# Patient Record
Sex: Male | Born: 1960
Health system: Southern US, Community
[De-identification: ages and names within clinical notes are randomized; demographics above are authoritative.]

## PROBLEM LIST (undated history)

## (undated) DIAGNOSIS — J449 Chronic obstructive pulmonary disease, unspecified: Secondary | ICD-10-CM

## (undated) DIAGNOSIS — R45851 Suicidal ideations: Secondary | ICD-10-CM

## (undated) DIAGNOSIS — F32A Depression, unspecified: Secondary | ICD-10-CM

## (undated) DIAGNOSIS — F1010 Alcohol abuse, uncomplicated: Secondary | ICD-10-CM

## (undated) DIAGNOSIS — I10 Essential (primary) hypertension: Secondary | ICD-10-CM

## (undated) DIAGNOSIS — F419 Anxiety disorder, unspecified: Secondary | ICD-10-CM

## (undated) HISTORY — PX: COLONOSCOPY: SHX174

## (undated) HISTORY — PX: NO PAST SURGERIES: SHX2092

## (undated) HISTORY — PX: MOUTH SURGERY: SHX715

---

## 2004-07-10 ENCOUNTER — Emergency Department (HOSPITAL_COMMUNITY): Admission: EM | Admit: 2004-07-10 | Discharge: 2004-07-10 | Payer: Self-pay | Admitting: Emergency Medicine

## 2006-02-15 ENCOUNTER — Emergency Department (HOSPITAL_COMMUNITY): Admission: EM | Admit: 2006-02-15 | Discharge: 2006-02-15 | Payer: Self-pay | Admitting: Emergency Medicine

## 2006-02-21 ENCOUNTER — Emergency Department (HOSPITAL_COMMUNITY): Admission: EM | Admit: 2006-02-21 | Discharge: 2006-02-21 | Payer: Self-pay | Admitting: Emergency Medicine

## 2006-07-01 ENCOUNTER — Emergency Department (HOSPITAL_COMMUNITY): Admission: EM | Admit: 2006-07-01 | Discharge: 2006-07-02 | Payer: Self-pay | Admitting: Emergency Medicine

## 2006-07-26 ENCOUNTER — Emergency Department (HOSPITAL_COMMUNITY): Admission: EM | Admit: 2006-07-26 | Discharge: 2006-07-26 | Payer: Self-pay | Admitting: Emergency Medicine

## 2006-08-30 ENCOUNTER — Emergency Department (HOSPITAL_COMMUNITY): Admission: EM | Admit: 2006-08-30 | Discharge: 2006-08-31 | Payer: Self-pay | Admitting: Emergency Medicine

## 2006-09-04 ENCOUNTER — Emergency Department (HOSPITAL_COMMUNITY): Admission: EM | Admit: 2006-09-04 | Discharge: 2006-09-05 | Payer: Self-pay | Admitting: Emergency Medicine

## 2006-09-12 ENCOUNTER — Ambulatory Visit: Payer: Self-pay | Admitting: Family Medicine

## 2006-09-28 ENCOUNTER — Emergency Department (HOSPITAL_COMMUNITY): Admission: EM | Admit: 2006-09-28 | Discharge: 2006-09-29 | Payer: Self-pay | Admitting: Emergency Medicine

## 2006-10-01 ENCOUNTER — Emergency Department (HOSPITAL_COMMUNITY): Admission: EM | Admit: 2006-10-01 | Discharge: 2006-10-01 | Payer: Self-pay | Admitting: Emergency Medicine

## 2006-10-02 ENCOUNTER — Ambulatory Visit: Payer: Self-pay | Admitting: Psychiatry

## 2006-10-02 ENCOUNTER — Inpatient Hospital Stay (HOSPITAL_COMMUNITY): Admission: AD | Admit: 2006-10-02 | Discharge: 2006-10-06 | Payer: Self-pay | Admitting: Psychiatry

## 2007-02-15 ENCOUNTER — Encounter: Payer: Self-pay | Admitting: Emergency Medicine

## 2007-02-16 ENCOUNTER — Inpatient Hospital Stay (HOSPITAL_COMMUNITY): Admission: EM | Admit: 2007-02-16 | Discharge: 2007-02-17 | Payer: Self-pay | Admitting: Internal Medicine

## 2007-02-20 ENCOUNTER — Emergency Department (HOSPITAL_COMMUNITY): Admission: EM | Admit: 2007-02-20 | Discharge: 2007-02-20 | Payer: Self-pay | Admitting: Emergency Medicine

## 2007-03-26 ENCOUNTER — Emergency Department (HOSPITAL_COMMUNITY): Admission: EM | Admit: 2007-03-26 | Discharge: 2007-03-26 | Payer: Self-pay | Admitting: Emergency Medicine

## 2007-04-18 ENCOUNTER — Inpatient Hospital Stay (HOSPITAL_COMMUNITY): Admission: EM | Admit: 2007-04-18 | Discharge: 2007-04-19 | Payer: Self-pay | Admitting: Emergency Medicine

## 2007-06-15 ENCOUNTER — Emergency Department (HOSPITAL_COMMUNITY): Admission: EM | Admit: 2007-06-15 | Discharge: 2007-06-15 | Payer: Self-pay | Admitting: Emergency Medicine

## 2009-03-11 ENCOUNTER — Emergency Department (HOSPITAL_COMMUNITY): Admission: EM | Admit: 2009-03-11 | Discharge: 2009-03-12 | Payer: Self-pay | Admitting: Emergency Medicine

## 2009-05-14 ENCOUNTER — Emergency Department (HOSPITAL_COMMUNITY): Admission: EM | Admit: 2009-05-14 | Discharge: 2009-05-14 | Payer: Self-pay | Admitting: Emergency Medicine
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
## 2010-07-27 NOTE — H&P (Signed)
NAME:  Zachary [name], Zachary [name] NO.:  [id number]   MEDICAL RECORD NO.:  [id number]          PATIENT TYPE:  EMS   LOCATION:  ED                           [facility]ACILITY[facility]  [facility]   PHYSICIAN:  Michaelyn Barter, M.D. DATE OF BIRTH:  11/20/1960   DATE OF ADMISSION:  02/15/2007  DATE OF DISCHARGE:                              HISTORY & PHYSICAL   PRIMARY CARE DOCTOR:  Unassigned.   CHIEF COMPLAINT:  Chest pain.   HISTORY OF PRESENT ILLNESS:  Zachary [name] is a 50 year old gentleman who  admits to being an alcoholic.  He states that he developed chest pain  approximately 3 weeks ago.  He states that he self medicated himself  with alcohol, however, the pain has never completely resolved.  Over the  past 3 weeks the pain has become worse.  He describes the pain as  diffusely occurring across his chest, starting from the right side and  going straight across.  He is very vague with regards to what the pain  feels like stating that he is unsure.  He initially indicated that the  pain comes and goes but then stated it was somewhat constant.  There are  no triggering factors for the pain but he indicates that drinking  alcohol makes the pain feel better.  He states that he typically drinks  anywhere from 3-4 beers and the pain would go away.  He also complains  of a productive cough which he describes as greenish-yellow in color.  He complains subjective fevers as well as chill.  He was seen in  [facility] last Thursday and was subsequently diagnosed as  having bronchitis and for this he was treated with a Z-Pak and  discharged.  He indicates that he his symptoms persisted despite him  completing the course of antibiotics.  He also complains of  shortness  of breath and indicates that his breathing has continued to decline over  the past several days.  He complains of diarrhea and attributes this to  the antibiotics that he was treated with recently.  He admits to abusing  alcohol on a daily basis and states that today he drank 14 beers.   PAST MEDICAL HISTORY:  1. Depression.  2. Bronchitis.  3. Pneumonia.  4. Childhood asthma.   PAST SURGICAL HISTORY:  None.   ALLERGIES:  PENICILLIN, reaction is unknown.   HOME MEDICATIONS:  None.   SOCIAL HISTORY:  The patient openly admits to being an alcoholic.  He is  very vague with regards to the amount of alcohol he drinks a day.  Initially he stated that he drank wine, then he went on to state that he  drank a fifth a day, and then he finally admitted that he drinks  anywhere from one 6-pack to 12 cans of beer per day.  Cigarettes:  The  patient started smoking at the age of 69.  He smokes 2 packs of  cigarettes per day.  Cocaine:  The patient openly admits to using  cocaine.  He states that the last time that he used cocaine was 3 weeks  ago.  It was provided to him by his ex-wife and at that time he used  powder cocaine.  Marijuana:  The patient openly admits to using  marijuana.   FAMILY HISTORY:  His mother had a CVA.  His father had hypertension and  also 2 myocardial infarctions.  Sister died from a brain tumor.   REVIEW OF SYSTEMS:  As per HPI, otherwise all other systems are  negative.   PHYSICAL EXAMINATION:  GENERAL: The patient is awake.  He is  cooperative.  He is in no obvious respiratory distress.  VITAL SIGNS:  His temperature is 98.2, blood pressure 133/72, heart rate  85, respirations 18, O2 sat is 96%.  HEENT:  Normocephalic atraumatic.  Anicteric.  Extraocular movements are  intact.  Oral mucosa is pink.  No thrush.  No exudates.  NECK:  Supple.  No JVD.  No lymphadenopathy.  CARDIAC:  S1 S2 present.  Regular rate and rhythm.  RESPIRATORY:  No crackles or wheezes.  ABDOMEN:  Flat, soft, nontender, nondistended.  Positive bowel sounds.  No hepatosplenomegaly.  No masses palpated.  EXTREMITIES:  No leg edema.  NEUROLOGIC:  The patient is alert, oriented x3.  Cranial nerves II-XII   are intact.  MUSCULOSKELETAL:  With 5/5 upper and lower extremity strength.   LABORATORY:  Sodium is 136, potassium 3.8, chloride 103, CO2 23, glucose  112, BUN 2, creatinine 0.71, calcium is 8.9.  Alcohol level is 273.  CK-  MB, point-of-care, is less than 1.  Troponin I, point-of-care, is less  than 0.05.  White blood cell count is 8.1, hemoglobin 15.3, hematocrit  44.1, platelets 279.  Urinalysis:  Negative.  Urine drug screen  negative.   ASSESSMENT/PLAN:  1. Pneumonia.  We will start the patient on empiric intravenous      antibiotics.  We will check blood as well as sputum cultures.  2. History of ongoing alcohol abuse.  We will start the patient on      Librium alcohol withdrawal protocol.  We will provide the patient      with thiamine, folic acid, and multivitamin.  3. Chest Pain. Sounds atypical, it may be related to the patient's      pneumonia?.  Will cycle the patient't cardiac marker, and  provide      prn pain meds.  4. Gastrointestinal prophylaxis.  We will provide Protonix.  5. Deep vein thrombosis prophylaxis.  We will provide Lovenox.  6. Cigarette abuse.  We will consider a nicotine patch.  7. Diarrhea.  We will check stools for C. diff.      Michaelyn Barter, M.D.  Electronically Signed     OR/MEDQ  D:  02/15/2007  T:  02/16/2007  Job:  [id number]
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
## 2010-07-27 NOTE — Discharge Summary (Signed)
NAMEJEVEN, Zachary [name] NO.:  [id number]   MEDICAL RECORD NO.:  [id number]          PATIENT TYPE:  INP   LOCATION:  5014                         FACILITY:  [facility]   PHYSICIAN:  Elliot Cousin, M.D.    DATE OF BIRTH:  Nov 30, 1960   DATE OF ADMISSION:  02/16/2007  DATE OF DISCHARGE:  02/17/2007                               DISCHARGE SUMMARY   DISCHARGE DIAGNOSES:  1. Pneumonia superimposed on bronchitis.  2. Chest pain thought to be musculoskeletal in origin.  3. Tobacco and alcohol abuse.  4. Anxiety, not otherwise specified.  5. Reported diarrhea, none during the hospitalization.  6. Reported suicidal ideation.  The patient was evaluated by      psychiatrist Dr. Jeanie Sewer, and Dr. Jeanie Sewer felt that the patient      was not suicidal.   MEDICATIONS:  1. Avelox 400 mg daily for 5 more days.  2. Albuterol MDI 2 puffs every 4 hours as needed.  3. Multivitamin with iron once daily.  4. Robitussin DM 10 mL every 4 hours as needed for cough.   DISCHARGE DISPOSITION:  The patient was discharged to home in improved  and stable condition on February 17, 2007.  He was advised to follow up  with the [facility] and [facility] in 1 week.   CONSULTATIONS:  Antonietta Breach, M.D.   PROCEDURE PERFORMED:  Chest X-Ray.  The results revealed subtle airspace  disease in the right lower lung, appears new from prior study.   HISTORY OF PRESENT ILLNESS:  The patient is a 50 year old man with a  past medical history significant for alcohol abuse, depression, and a  recent hospitalization at [facility] for acute bronchitis.  He presented to the emergency department at [facility] on  February 15, 2007 with the chief complaint of chest pain.  The pain had  been mild-to-moderate over the past 3 weeks.  It had become  progressively worse.  He did acknowledge a productive cough with yellow  to green sputum.  Because of the pain, he had been medicating himself  with  a six-pack to a 12-pack of beer daily.  He also smokes two packs of  cigarettes daily and occasionally smokes cocaine.   The patient was therefore admitted for further evaluation and  management.  For additional details, please see the dictated history and  physical.   HOSPITAL COURSE:  Problem 1.  CHEST PAIN, PNEUMONIA, HISTORY OF  BRONCHITIS.  The patient was started on empiric antibiotic treatment  with Avelox.  Symptomatic treatment was started with albuterol and  Atrovent nebulizers, Mucinex, and as-needed Robitussin DM.  Tylenol was  started for pain that was mild and Percocet was started for pain that  was moderate.  Blood cultures were ordered as were cardiac enzymes.  Over the course of the hospitalization, the patient's chest pain  subsided but it did not completely resolve, as the patient did have an  ongoing intermittently productive and dry cough.  His cardiac enzymes  were completely normal.  His blood cultures remained negative.  The  patient is currently afebrile, and his white  blood cell count is within  normal limits.  Given these findings, he will be discharged to home  today on 5 more days of Avelox therapy.  As of today, he has received 2  days of antibiotic treatment.  A prescription for Percocet was going to  be written and given to the patient; however he refused it.  The patient  was therefore advised to take as needed over-the-counter Tylenol or  ibuprofen.  Of note, Protonix was started empirically during the  hospitalization; however, it was discontinued at the time of hospital  discharge.   Problem 2.  TOBACCO AND ALCOHOL ABUSE.  The patient was provided with  tobacco and alcohol cessation counseling.  [facility] were  recommended to the patient by psychiatrist, Dr. Jeanie Sewer.  The patient  was offered a nicotine patch; however, he refused.  He was started on  vitamin therapy and a Librium taper during the hospitalization.  He  demonstrated no  signs or symptoms consistent with alcohol withdrawal.  The patient was advised to continue vitamin therapy and to avoid alcohol  and tobacco use in the outpatient setting.   Problem 3.  HISTORY OF DEPRESSION AND ANXIETY, NOT OTHERWISE SPECIFIED.  According to the report from the nurse in the emergency department, the  patient apparently voiced some suicidal ideation.  The patient later  recanted the notion that he wanted to kill himself.  Nevertheless, a 24-  hour sitter was ordered.  Psychiatrist, Dr. Jeanie Sewer, was consulted.  The official dictation from Dr. Jeanie Sewer is not back yet.  However, per  his written assessment in the chart, the patient did not have suicidal  ideation, rather he had an anxiety disorder not otherwise specified and  alcohol dependence.  Dr. Jeanie Sewer recommended the 12-step programs that  are available and [facility] for the patient.  Information  was given to the patient by the clinical social worker prior to hospital  discharge.   Of note, the Pneumovax and the flu vaccine were given during the  hospital course.      Elliot Cousin, M.D.  Electronically Signed     DF/MEDQ  D:  02/17/2007  T:  02/18/2007  Job:  [id number]

## 2010-07-27 NOTE — H&P (Signed)
NAME:  Zachary [name], Zachary [name] NO.:  [id number]   MEDICAL RECORD NO.:  [id number]          PATIENT TYPE:  IPS   LOCATION:  0507                          FACILITY:  [facility]   PHYSICIAN:  Geoffery Lyons, M.D.      DATE OF BIRTH:  [birth year]   DATE OF ADMISSION:  10/02/2006  DATE OF DISCHARGE:                       PSYCHIATRIC ADMISSION ASSESSMENT   IDENTIFYING INFORMATION:  This is 50 year old divorced white male  voluntarily admitted on October 02, 2006.   HISTORY OF PRESENT ILLNESS:  The patient presents with a history of  alcohol abuse and depressive symptoms.  The patient has been drinking  beer.  He states he is trying to stay away from drinking liquor.  He has  been drinking for the last few weeks, drinking up to 24 beers at a time.  The patient states he stopped drinking in December and then relapsed in  January.  He started off with some casual drinking.  He states his  drinking escalated since his mother passed away and has been self-  medicating.  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  He  has been drinking up to a fifth at a time.  Last drink was on Saturday  prior to patient's admission.  He has lost about 60 pounds over a month  period of time.  He reports problems with vomiting.   PAST PSYCHIATRIC HISTORY:  First admission to [facility].  Was detoxed when he was in [place].   SOCIAL HISTORY:  This is a 50 year old divorced white male.  He lives  with his two sons.  The patient is self-employed.   FAMILY HISTORY:  Father with alcohol problems.   ALCOHOL/DRUG HISTORY:  The patient smokes.  Denies any drug use.   PRIMARY CARE PHYSICIAN:  None.   MEDICAL PROBLEMS:  Had a recent episode of pneumonia and chronic  bronchitis.  Denies any seizure activity.   MEDICATIONS:  Has been on no prior medications prior to arrival.   ALLERGIES:  PENICILLIN.   PHYSICAL EXAMINATION:  The patient was assessed at [facility] Emergency  Department.   This is a middle-aged male who appears tired.  He appears  nontremulous at this time.  While he was in the emergency department, he  received Zofran and Ativan along with some IV fluids.  His temperature  is 97.4, heart rate 104, respirations 20, blood pressure 143/94, height  5 feet 3 inches tall, weight 158 pounds.  The patient does show some  mild upper extremity tremors.   LABORATORY DATA:  His urinalysis was within normal limits.  His CBC  within normal limits.  The patient's urine drug screen was positive for  benzodiazepines.  His AST is elevated mildly at 51.   MENTAL STATUS EXAM:  He is fully alert, cooperative.  He appears tired.  He has fair eye contact.  His speech is clear, normal pace and tone.  Mood is depressed.  The patient is cooperative, pleasant.  Thought  process with no evidence of thought disorder.  Cognitive function  intact.  His memory is good.  He is a  good historian.  Judgment and  insight is good.  Poor impulse control.  Good fund of knowledge.   DIAGNOSES:  AXIS I:  Alcohol dependence.  Depressive disorder not  otherwise specified.  AXIS II:  Deferred.  AXIS III:  Chronic bronchitis.  AXIS IV:  Psychosocial problems related to ongoing grief for mother's  death.  AXIS V:  Current 40.   PLAN:  Contract for safety.  Stabilize mood and thinking.  Will detox  the patient with Librium protocol.  Work on relapse prevention.  Will  assess the patient for depressive symptoms as the patient continues to  detox.  Casemanager is to assess rehab program available.   TENTATIVE LENGTH OF STAY:  Four to five days.      Landry Corporal, N.P.      Geoffery Lyons, M.D.  Electronically Signed    JO/MEDQ  D:  10/06/2006  T:  10/06/2006  Job:  [id number]

## 2010-07-30 NOTE — Discharge Summary (Signed)
NAME:  Zachary [name], Zachary [name] NO.:  [id number]   MEDICAL RECORD NO.:  [id number]          PATIENT TYPE:  IPS   LOCATION:  0507                          FACILITY:  [facility]   PHYSICIAN:  Geoffery Lyons, M.D.      DATE OF BIRTH:  Oct 02, 1960   DATE OF ADMISSION:  10/02/2006  DATE OF DISCHARGE:  10/06/2006                               DISCHARGE SUMMARY   CHIEF COMPLAINT AND PRESENT ILLNESS:  This was the first admission to  [facility] for this 50 year old divorced white male  voluntarily admitted.  History of alcohol abuse and depression.  Had  been drinking beer.  Trying to stay away from drinking liquor.  Drinking  for the past few weeks, drinking up to 24 beers at a time.  He stopped  drinking in December and then relapsed in January.  Starts off with some  casual drinking.  Drinking escalated since mother passed away and has  been self-medicating.  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  Been drinking up to a fifth at a time.  Last drink on Saturday prior to  the admission.  Has lost about 60 pounds.  Reports problem with  vomiting.   PAST PSYCHIATRIC HISTORY:  First time at [facility].  Was detoxed  when he was in [place].   ALCOHOL/DRUG HISTORY:  Persistent use of alcohol as already stated.   MEDICAL HISTORY:  Past history of pneumonia and bronchitis.   MEDICATIONS:  None currently.   PHYSICAL EXAMINATION:  Performed and failed to show any acute findings.   LABORATORY DATA:  CBC within normal limits.  Urine drug screen positive  for benzodiazepines.  SGOT 51.   MENTAL STATUS EXAM:  Fully alert, cooperative male who appears tired.  Fair eye contact.  Speech is clear, normal rate, tempo and production.  Mood is depressed.  Thought processes are logical, coherent and  relevant.  No evidence of delusions.  No active suicidal or homicidal  ideation.  No hallucinations.  Cognition well-preserved.   ADMISSION DIAGNOSES:  AXIS I:  Alcohol  dependence.  Depressive disorder  not otherwise specified.  AXIS II:  No diagnosis.  AXIS III;  Chronic bronchitis.  AXIS IV:  Moderate.  AXIS V:  GAF upon admission 40; highest GAF in the last year 70.   HOSPITAL COURSE:  He was admitted.  He was started in individual and  group psychotherapy.  He was given trazodone for sleep.  He was  detoxified with Librium.  He was later started on Paxil and Antabuse.  As already stated, mother died in 07/05/06.  Has not gotten over  it.  Last alcohol two days prior to the admission.  Had been in  residential at [facility].  His detox was pretty rough requiring p.r.n. of  Librium, endorsed nightmares.  He continued to be tremulous.  While at  [facility], he was placed on Paxil.  Endorsed depression and anxiety.  Has  dealt with multiple stressors work-related, dealing with his sons who  work with him, deals with the mother, ex-wife, getting back to be part  of his life in a very confusing situation.  Endorsed increased anxiety  and depression.  Endorsed he had used alcohol to cope.  He was willing  to go back on the Paxil and we told talked about Antabuse.  By October 06, 2006, he endorsed he was much better, slept all night, fully detoxed.  Mood was improved.  Willing to take the Antabuse and give this modality  a try.  Talked to his children and they all have different plans.  They  are going to get different jobs so that is going to decrease most of his  pressure.   DISCHARGE DIAGNOSES:  AXIS I:  Alcohol dependence.  Depressive disorder  not otherwise specified.  AXIS II:  No diagnosis.  AXIS III:  Chronic bronchitis.  AXIS IV:  Moderate.  AXIS V:  GAF upon discharge 55-60.   DISCHARGE MEDICATIONS:  1. Paxil 20 mg per day.  2. Trazodone 50 mg at bedtime for sleep.  3. Antabuse 250 mg per day.   FOLLOWUP:  [facility].      Geoffery Lyons, M.D.  Electronically Signed     IL/MEDQ  D:  10/24/2006  T:  10/25/2006  Job:  [id number]

## 2010-12-02 LAB — URINALYSIS, ROUTINE W REFLEX MICROSCOPIC
Bilirubin Urine: NEGATIVE
Hgb urine dipstick: NEGATIVE
Nitrite: NEGATIVE
Protein, ur: NEGATIVE
Specific Gravity, Urine: 1.007
Urobilinogen, UA: 0.2

## 2010-12-02 LAB — I-STAT 8, (EC8 V) (CONVERTED LAB)
Acid-base deficit: 1
BUN: <3 — ABNORMAL LOW
Bicarbonate: 21
Chloride: 108
Glucose, Bld: 95
HCT: 48
Operator id: 234501
Potassium: 4.1
pCO2, Ven: 28.1 — ABNORMAL LOW

## 2010-12-02 LAB — CBC
Hemoglobin: 16.4
MCHC: 34.7
MCV: 95.1
RBC: 4.97
WBC: 9.6

## 2010-12-02 LAB — RAPID URINE DRUG SCREEN, HOSP PERFORMED
Barbiturates: NOT DETECTED
Cocaine: NOT DETECTED
Opiates: NOT DETECTED

## 2010-12-02 LAB — POCT I-STAT CREATININE
Creatinine, Ser: 0.8
Operator id: 234501

## 2010-12-02 LAB — DIFFERENTIAL
Basophils Absolute: 0
Lymphs Abs: 2.7

## 2010-12-03 LAB — DIFFERENTIAL
Basophils Absolute: 0
Basophils Relative: 0
Eosinophils Absolute: 0.1
Eosinophils Relative: 1
Lymphocytes Relative: 16
Lymphs Abs: 1.8
Monocytes Absolute: 0.9
Monocytes Relative: 8
Neutro Abs: 8.8 — ABNORMAL HIGH
Neutrophils Relative %: 76

## 2010-12-03 LAB — HEMOGLOBIN A1C: Hgb A1c MFr Bld: 5.3

## 2010-12-03 LAB — CBC
HCT: 45.3
Hemoglobin: 16.4
MCHC: 36.1 — ABNORMAL HIGH
MCV: 93.3
Platelets: 275
RBC: 4.85
RDW: 13.3
WBC: 11.7 — ABNORMAL HIGH

## 2010-12-03 LAB — CARDIAC PANEL(CRET KIN+CKTOT+MB+TROPI)
CK, MB: 2.4
CK, MB: 2.5
Relative Index: 1.8
Relative Index: 1.9
Troponin I: 0.01

## 2010-12-03 LAB — LIPID PANEL
Cholesterol: 262 — ABNORMAL HIGH
HDL: 108

## 2010-12-03 LAB — CK TOTAL AND CKMB (NOT AT ARMC): Relative Index: 2.4

## 2010-12-03 LAB — POCT CARDIAC MARKERS
CKMB, poc: 1.6
Myoglobin, poc: 76.1
Operator id: 3067
Troponin i, poc: <0.05

## 2010-12-03 LAB — RAPID URINE DRUG SCREEN, HOSP PERFORMED
Amphetamines: NOT DETECTED
Opiates: NOT DETECTED
Tetrahydrocannabinol: NOT DETECTED

## 2010-12-03 LAB — BASIC METABOLIC PANEL
BUN: 7
CO2: 22
CO2: 26
Calcium: 8.7
Calcium: 9.2
Chloride: 100
Creatinine, Ser: 0.66
GFR calc non Af Amer: >60
Glucose, Bld: 87
Potassium: 3.9
Potassium: 4.4
Sodium: 134 — ABNORMAL LOW

## 2010-12-03 LAB — PROTIME-INR: INR: 0.9

## 2010-12-07 LAB — BASIC METABOLIC PANEL
BUN: 6
CO2: 26
Creatinine, Ser: 0.73
GFR calc Af Amer: >60
Glucose, Bld: 114 — ABNORMAL HIGH
Sodium: 134 — ABNORMAL LOW

## 2010-12-07 LAB — CBC
HCT: 41.9
MCHC: 35.3
MCV: 94.2
Platelets: 171
RDW: 12.7

## 2010-12-07 LAB — RAPID URINE DRUG SCREEN, HOSP PERFORMED
Amphetamines: NOT DETECTED
Barbiturates: NOT DETECTED

## 2010-12-07 LAB — ETHANOL: Alcohol, Ethyl (B): <5

## 2010-12-07 LAB — DIFFERENTIAL
Basophils Absolute: 0
Basophils Relative: 0
Eosinophils Absolute: 0.1
Eosinophils Relative: 1
Lymphs Abs: 0.9

## 2010-12-20 LAB — CBC
HCT: 36.2 — ABNORMAL LOW
Hemoglobin: 15.3
MCHC: 34.6
MCV: 91.8
MCV: 94.1
Platelets: 224
RBC: 3.85 — ABNORMAL LOW
RBC: 4.81
WBC: 7.5

## 2010-12-20 LAB — URINALYSIS, ROUTINE W REFLEX MICROSCOPIC
Glucose, UA: NEGATIVE
Protein, ur: NEGATIVE
Specific Gravity, Urine: 1.004 — ABNORMAL LOW
Urobilinogen, UA: 0.2

## 2010-12-20 LAB — MAGNESIUM: Magnesium: 2.2

## 2010-12-20 LAB — COMPREHENSIVE METABOLIC PANEL
ALT: 62 — ABNORMAL HIGH
AST: 33
Albumin: 3.3 — ABNORMAL LOW
Alkaline Phosphatase: 82
Calcium: 9.1
GFR calc Af Amer: >60
Glucose, Bld: 67 — ABNORMAL LOW
Potassium: 3.6
Sodium: 140
Total Protein: 5.4 — ABNORMAL LOW

## 2010-12-20 LAB — CK TOTAL AND CKMB (NOT AT ARMC)
CK, MB: 2.2
Total CK: 136
Total CK: 89

## 2010-12-20 LAB — CULTURE, BLOOD (ROUTINE X 2): Culture: NO GROWTH

## 2010-12-20 LAB — TROPONIN I
Troponin I: 0.01
Troponin I: 0.02

## 2010-12-20 LAB — BASIC METABOLIC PANEL
CO2: 23
Chloride: 103
GFR calc Af Amer: >60
Sodium: 136

## 2010-12-20 LAB — RAPID URINE DRUG SCREEN, HOSP PERFORMED
Barbiturates: NOT DETECTED
Benzodiazepines: NOT DETECTED

## 2010-12-20 LAB — DIFFERENTIAL
Basophils Relative: 1
Eosinophils Absolute: 0.2
Monocytes Absolute: 1.1 — ABNORMAL HIGH
Monocytes Relative: 13 — ABNORMAL HIGH

## 2010-12-27 LAB — URINALYSIS, ROUTINE W REFLEX MICROSCOPIC
Bilirubin Urine: NEGATIVE
Glucose, UA: NEGATIVE
Glucose, UA: NEGATIVE
Hgb urine dipstick: NEGATIVE
Hgb urine dipstick: NEGATIVE
Ketones, ur: NEGATIVE
Nitrite: NEGATIVE
Specific Gravity, Urine: 1.025
Specific Gravity, Urine: 1.025
Urobilinogen, UA: 0.2
pH: 5.5
pH: 6

## 2010-12-27 LAB — COMPREHENSIVE METABOLIC PANEL
ALT: 59 — ABNORMAL HIGH
AST: 51 — ABNORMAL HIGH
Alkaline Phosphatase: 120 — ABNORMAL HIGH
BUN: 3 — ABNORMAL LOW
CO2: 23
CO2: 23
Calcium: 8.6
Chloride: 95 — ABNORMAL LOW
Creatinine, Ser: 0.85
GFR calc Af Amer: >60
GFR calc non Af Amer: >60
GFR calc non Af Amer: >60
Glucose, Bld: 118 — ABNORMAL HIGH
Sodium: 123 — ABNORMAL LOW
Total Bilirubin: 1.5 — ABNORMAL HIGH

## 2010-12-27 LAB — BASIC METABOLIC PANEL
CO2: 24
Chloride: 101
GFR calc Af Amer: >60
Potassium: 4
Sodium: 133 — ABNORMAL LOW

## 2010-12-27 LAB — CBC
HCT: 43.7
HCT: 49
Hemoglobin: 15.6
Hemoglobin: 16.9
MCHC: 34.5
MCV: 91.7
RBC: 4.77
RBC: 5.38
RDW: 12.4
WBC: 10.3

## 2010-12-27 LAB — DIFFERENTIAL
Basophils Absolute: 0.1
Basophils Relative: 0
Eosinophils Absolute: 0
Eosinophils Absolute: 0.2
Eosinophils Relative: 2
Lymphocytes Relative: 5 — ABNORMAL LOW
Lymphs Abs: 1.6
Neutro Abs: 5.3
Neutrophils Relative %: 69

## 2010-12-27 LAB — ETHANOL
Alcohol, Ethyl (B): 283 — ABNORMAL HIGH
Alcohol, Ethyl (B): <5

## 2010-12-27 LAB — LIPASE, BLOOD
Lipase: 26
Lipase: 53

## 2010-12-27 LAB — RAPID URINE DRUG SCREEN, HOSP PERFORMED
Barbiturates: NOT DETECTED
Cocaine: NOT DETECTED

## 2010-12-29 LAB — COMPREHENSIVE METABOLIC PANEL
Alkaline Phosphatase: 130 — ABNORMAL HIGH
BUN: 3 — ABNORMAL LOW
CO2: 23
Chloride: 97
Glucose, Bld: 87
Potassium: 3.8
Total Bilirubin: 0.8

## 2010-12-29 LAB — I-STAT 8, (EC8 V) (CONVERTED LAB)
Bicarbonate: 21.2
HCT: 45
Hemoglobin: 15.3
Operator id: 277751
Sodium: 135
TCO2: 22
pCO2, Ven: 37.6 — ABNORMAL LOW

## 2010-12-29 LAB — HEPATIC FUNCTION PANEL
Albumin: 3.3 — ABNORMAL LOW
Indirect Bilirubin: 0.5
Total Bilirubin: 0.6
Total Protein: 5.9 — ABNORMAL LOW

## 2010-12-29 LAB — ETHANOL
Alcohol, Ethyl (B): 209 — ABNORMAL HIGH
Alcohol, Ethyl (B): 332 — ABNORMAL HIGH

## 2010-12-29 LAB — DIFFERENTIAL
Basophils Absolute: 0.1
Basophils Relative: 1
Eosinophils Absolute: 0.2
Monocytes Absolute: 1.1 — ABNORMAL HIGH
Neutro Abs: 4.7
Neutrophils Relative %: 57

## 2010-12-29 LAB — CBC
HCT: 47.5
Hemoglobin: 14.4
Hemoglobin: 16.4
MCHC: 34.8
MCV: 94.4
RBC: 5.07
RDW: 14.4 — ABNORMAL HIGH
WBC: 8.2

## 2010-12-29 LAB — URINALYSIS, ROUTINE W REFLEX MICROSCOPIC
Hgb urine dipstick: NEGATIVE
Ketones, ur: 15 — AB
Protein, ur: NEGATIVE
Urobilinogen, UA: 0.2

## 2010-12-29 LAB — RAPID URINE DRUG SCREEN, HOSP PERFORMED
Amphetamines: NOT DETECTED
Barbiturates: NOT DETECTED
Opiates: NOT DETECTED

## 2015-10-28 ENCOUNTER — Emergency Department (HOSPITAL_COMMUNITY): Payer: Self-pay

## 2015-10-28 ENCOUNTER — Encounter (HOSPITAL_COMMUNITY): Payer: Self-pay | Admitting: *Deleted

## 2015-10-28 ENCOUNTER — Inpatient Hospital Stay (HOSPITAL_COMMUNITY)
Admission: EM | Admit: 2015-10-28 | Discharge: 2015-10-30 | DRG: 313 | Disposition: A | Discharge status: Home / Self Care | Payer: Self-pay | Attending: Family Medicine | Admitting: Family Medicine

## 2015-10-28 DIAGNOSIS — Z8249 Family history of ischemic heart disease and other diseases of the circulatory system: Secondary | ICD-10-CM

## 2015-10-28 DIAGNOSIS — R079 Chest pain, unspecified: Secondary | ICD-10-CM | POA: Diagnosis present

## 2015-10-28 DIAGNOSIS — F10939 Alcohol use, unspecified with withdrawal, unspecified: Secondary | ICD-10-CM | POA: Diagnosis present

## 2015-10-28 DIAGNOSIS — K30 Functional dyspepsia: Secondary | ICD-10-CM

## 2015-10-28 DIAGNOSIS — F1010 Alcohol abuse, uncomplicated: Secondary | ICD-10-CM | POA: Diagnosis present

## 2015-10-28 DIAGNOSIS — F10239 Alcohol dependence with withdrawal, unspecified: Secondary | ICD-10-CM | POA: Diagnosis present

## 2015-10-28 DIAGNOSIS — I10 Essential (primary) hypertension: Secondary | ICD-10-CM | POA: Diagnosis present

## 2015-10-28 DIAGNOSIS — R0789 Other chest pain: Principal | ICD-10-CM | POA: Diagnosis present

## 2015-10-28 DIAGNOSIS — R739 Hyperglycemia, unspecified: Secondary | ICD-10-CM | POA: Diagnosis present

## 2015-10-28 DIAGNOSIS — R202 Paresthesia of skin: Secondary | ICD-10-CM

## 2015-10-28 DIAGNOSIS — F10930 Alcohol use, unspecified with withdrawal, uncomplicated: Secondary | ICD-10-CM

## 2015-10-28 DIAGNOSIS — Z720 Tobacco use: Secondary | ICD-10-CM | POA: Diagnosis present

## 2015-10-28 DIAGNOSIS — F10230 Alcohol dependence with withdrawal, uncomplicated: Secondary | ICD-10-CM | POA: Diagnosis present

## 2015-10-28 DIAGNOSIS — F17200 Nicotine dependence, unspecified, uncomplicated: Secondary | ICD-10-CM | POA: Diagnosis present

## 2015-10-28 DIAGNOSIS — E878 Other disorders of electrolyte and fluid balance, not elsewhere classified: Secondary | ICD-10-CM | POA: Diagnosis present

## 2015-10-28 DIAGNOSIS — R110 Nausea: Secondary | ICD-10-CM

## 2015-10-28 HISTORY — DX: Alcohol abuse, uncomplicated: F10.10

## 2015-10-28 HISTORY — DX: Essential (primary) hypertension: I10

## 2015-10-28 LAB — CBC
HEMATOCRIT: 44.5 % (ref 39.0–52.0)
Hemoglobin: 15.9 g/dL (ref 13.0–17.0)
MCH: 32.4 pg (ref 26.0–34.0)
MCHC: 35.7 g/dL (ref 30.0–36.0)
MCV: 90.6 fL (ref 78.0–100.0)
PLATELETS: 243 10*3/uL (ref 150–400)
RBC: 4.91 MIL/uL (ref 4.22–5.81)
RDW: 13 % (ref 11.5–15.5)
WBC: 8.2 10*3/uL (ref 4.0–10.5)

## 2015-10-28 LAB — PROTIME-INR
INR: 0.94
PROTHROMBIN TIME: 12.5 s (ref 11.4–15.2)

## 2015-10-28 LAB — HEPATIC FUNCTION PANEL
ALK PHOS: 92 U/L (ref 38–126)
ALT: 55 U/L (ref 17–63)
AST: 41 U/L (ref 15–41)
Albumin: 4.2 g/dL (ref 3.5–5.0)
BILIRUBIN TOTAL: 0.5 mg/dL (ref 0.3–1.2)
TOTAL PROTEIN: 6.8 g/dL (ref 6.5–8.1)

## 2015-10-28 LAB — URINALYSIS, ROUTINE W REFLEX MICROSCOPIC
Bilirubin Urine: NEGATIVE
Glucose, UA: NEGATIVE mg/dL
HGB URINE DIPSTICK: NEGATIVE
Ketones, ur: NEGATIVE mg/dL
LEUKOCYTES UA: NEGATIVE
Nitrite: NEGATIVE
PROTEIN: NEGATIVE mg/dL
Specific Gravity, Urine: 1.01 (ref 1.005–1.030)
pH: 8 (ref 5.0–8.0)

## 2015-10-28 LAB — BASIC METABOLIC PANEL
ANION GAP: 14 (ref 5–15)
BUN: 12 mg/dL (ref 6–20)
CALCIUM: 9.1 mg/dL (ref 8.9–10.3)
CO2: 25 mmol/L (ref 22–32)
Chloride: 98 mmol/L — ABNORMAL LOW (ref 101–111)
Creatinine, Ser: 0.81 mg/dL (ref 0.61–1.24)
GFR calc Af Amer: >60 mL/min (ref >60)
Glucose, Bld: 247 mg/dL — ABNORMAL HIGH (ref 65–99)
POTASSIUM: 3.6 mmol/L (ref 3.5–5.1)
SODIUM: 137 mmol/L (ref 135–145)

## 2015-10-28 LAB — I-STAT TROPONIN, ED
TROPONIN I, POC: 0.01 ng/mL (ref 0.00–0.08)
Troponin i, poc: 0 ng/mL (ref 0.00–0.08)

## 2015-10-28 LAB — BRAIN NATRIURETIC PEPTIDE: B Natriuretic Peptide: 4.8 pg/mL (ref 0.0–100.0)

## 2015-10-28 LAB — TROPONIN I

## 2015-10-28 LAB — ETHANOL

## 2015-10-28 LAB — RAPID URINE DRUG SCREEN, HOSP PERFORMED
Amphetamines: NOT DETECTED
BARBITURATES: NOT DETECTED
BENZODIAZEPINES: NOT DETECTED
Cocaine: NOT DETECTED
Opiates: NOT DETECTED
Tetrahydrocannabinol: NOT DETECTED

## 2015-10-28 LAB — LIPASE, BLOOD: LIPASE: 23 U/L (ref 11–51)

## 2015-10-28 MED ORDER — LORAZEPAM 2 MG/ML IJ SOLN
0.0000 mg | Freq: Four times a day (QID) | INTRAMUSCULAR | Status: DC
  Filled 2015-10-28: qty 1

## 2015-10-28 MED ORDER — VITAMIN B-1 100 MG PO TABS
100.0000 mg | ORAL_TABLET | Freq: Every day | ORAL | Status: DC
  Administered 2015-10-28: 100 mg via ORAL
  Filled 2015-10-28 (×2): qty 1

## 2015-10-28 MED ORDER — FAMOTIDINE IN NACL 20-0.9 MG/50ML-% IV SOLN
20.0000 mg | Freq: Two times a day (BID) | INTRAVENOUS | Status: DC
  Administered 2015-10-28 – 2015-10-30 (×3): 20 mg via INTRAVENOUS
  Filled 2015-10-28 (×5): qty 50

## 2015-10-28 MED ORDER — LORAZEPAM 2 MG/ML IJ SOLN: 0.0000 mg | Freq: Two times a day (BID) | INTRAMUSCULAR | Status: DC

## 2015-10-28 MED ORDER — NITROGLYCERIN 0.4 MG SL SUBL: 0.4000 mg | SUBLINGUAL_TABLET | SUBLINGUAL | Status: DC | PRN

## 2015-10-28 MED ORDER — NICOTINE 14 MG/24HR TD PT24
14.0000 mg | MEDICATED_PATCH | Freq: Every day | TRANSDERMAL | Status: DC
  Filled 2015-10-28: qty 1

## 2015-10-28 MED ORDER — LORAZEPAM 2 MG/ML IJ SOLN
0.0000 mg | Freq: Four times a day (QID) | INTRAMUSCULAR | Status: DC
  Administered 2015-10-29 (×2): 2 mg via INTRAVENOUS
  Administered 2015-10-30 (×2): 1 mg via INTRAVENOUS
  Filled 2015-10-28 (×4): qty 1

## 2015-10-28 MED ORDER — LORAZEPAM 2 MG/ML IJ SOLN
1.0000 mg | Freq: Four times a day (QID) | INTRAMUSCULAR | Status: DC | PRN
  Administered 2015-10-29: 1 mg via INTRAVENOUS
  Filled 2015-10-28: qty 1

## 2015-10-28 MED ORDER — LORAZEPAM 1 MG PO TABS: 0.0000 mg | ORAL_TABLET | Freq: Two times a day (BID) | ORAL | Status: DC

## 2015-10-28 MED ORDER — LORAZEPAM 1 MG PO TABS
2.0000 mg | ORAL_TABLET | Freq: Once | ORAL | Status: AC
  Administered 2015-10-28: 2 mg via ORAL
  Filled 2015-10-28: qty 2

## 2015-10-28 MED ORDER — ONDANSETRON HCL 4 MG/2ML IJ SOLN: 4.0000 mg | Freq: Four times a day (QID) | INTRAMUSCULAR | Status: DC | PRN

## 2015-10-28 MED ORDER — GI COCKTAIL ~~LOC~~
30.0000 mL | Freq: Once | ORAL | Status: AC
  Administered 2015-10-28: 30 mL via ORAL
  Filled 2015-10-28: qty 30

## 2015-10-28 MED ORDER — ZOLPIDEM TARTRATE 5 MG PO TABS: 5.0000 mg | ORAL_TABLET | Freq: Every evening | ORAL | Status: DC | PRN

## 2015-10-28 MED ORDER — ADULT MULTIVITAMIN W/MINERALS CH
1.0000 | ORAL_TABLET | Freq: Every day | ORAL | Status: DC
  Administered 2015-10-29 – 2015-10-30 (×2): 1 via ORAL
  Filled 2015-10-28 (×2): qty 1

## 2015-10-28 MED ORDER — METOPROLOL TARTRATE 12.5 MG HALF TABLET
12.5000 mg | ORAL_TABLET | Freq: Two times a day (BID) | ORAL | Status: DC
  Administered 2015-10-28 – 2015-10-30 (×4): 12.5 mg via ORAL
  Filled 2015-10-28 (×4): qty 1

## 2015-10-28 MED ORDER — VITAMIN B-1 100 MG PO TABS
100.0000 mg | ORAL_TABLET | Freq: Every day | ORAL | Status: DC
  Administered 2015-10-29 – 2015-10-30 (×2): 100 mg via ORAL
  Filled 2015-10-28 (×2): qty 1

## 2015-10-28 MED ORDER — THIAMINE HCL 100 MG/ML IJ SOLN: 100.0000 mg | Freq: Every day | INTRAMUSCULAR | Status: DC

## 2015-10-28 MED ORDER — LORAZEPAM 1 MG PO TABS
1.0000 mg | ORAL_TABLET | Freq: Four times a day (QID) | ORAL | Status: DC | PRN
  Administered 2015-10-29 – 2015-10-30 (×3): 1 mg via ORAL
  Filled 2015-10-28 (×4): qty 1

## 2015-10-28 MED ORDER — ONDANSETRON HCL 4 MG/2ML IJ SOLN
4.0000 mg | Freq: Once | INTRAMUSCULAR | Status: AC
  Administered 2015-10-28: 4 mg via INTRAVENOUS
  Filled 2015-10-28: qty 2

## 2015-10-28 MED ORDER — FOLIC ACID 1 MG PO TABS
1.0000 mg | ORAL_TABLET | Freq: Every day | ORAL | Status: DC
  Administered 2015-10-29 – 2015-10-30 (×2): 1 mg via ORAL
  Filled 2015-10-28 (×2): qty 1

## 2015-10-28 MED ORDER — ASPIRIN EC 81 MG PO TBEC
81.0000 mg | DELAYED_RELEASE_TABLET | Freq: Every day | ORAL | Status: DC
  Administered 2015-10-29 – 2015-10-30 (×2): 81 mg via ORAL
  Filled 2015-10-28 (×2): qty 1

## 2015-10-28 MED ORDER — ACETAMINOPHEN 325 MG PO TABS: 650.0000 mg | ORAL_TABLET | ORAL | Status: DC | PRN

## 2015-10-28 MED ORDER — INSULIN ASPART 100 UNIT/ML ~~LOC~~ SOLN
0.0000 [IU] | Freq: Three times a day (TID) | SUBCUTANEOUS | Status: DC
  Administered 2015-10-29: 1 [IU] via SUBCUTANEOUS

## 2015-10-28 MED ORDER — ASPIRIN 81 MG PO CHEW
324.0000 mg | CHEWABLE_TABLET | Freq: Once | ORAL | Status: AC
  Administered 2015-10-28: 324 mg via ORAL
  Filled 2015-10-28: qty 4

## 2015-10-28 MED ORDER — LORAZEPAM 1 MG PO TABS
0.0000 mg | ORAL_TABLET | Freq: Four times a day (QID) | ORAL | Status: DC
Start: 2015-10-28 — End: 2015-10-28
  Administered 2015-10-28: 2 mg via ORAL
  Filled 2015-10-28 (×2): qty 1

## 2015-10-28 MED ORDER — ENOXAPARIN SODIUM 40 MG/0.4ML ~~LOC~~ SOLN
40.0000 mg | Freq: Every day | SUBCUTANEOUS | Status: DC
  Administered 2015-10-28 – 2015-10-30 (×2): 40 mg via SUBCUTANEOUS
  Filled 2015-10-28 (×2): qty 0.4

## 2015-10-28 NOTE — ED Notes (Signed)
Pt to desk stating that he is withdrawing from ETOH

## 2015-10-28 NOTE — H&P (Signed)
History and Physical    Zachary [name] RUE:[id number] DOB: Feb 18, 1961 DOA: 10/28/2015  PCP: No primary care provider on file.   Patient coming from: Home   Chief Complaint: Chest pain, alcohol withdrawal   HPI: Zachary Peerorman E Eudy is a 55 y.o. male with medical history significant for hypertension and alcoholism only, though he does not see a physician regularly, now presenting to the emergency department for evaluation of chest pain and alcohol withdrawal. Patient reports that he had maintained abstinence from alcohol for 8 years before entering into a relationship with an alcoholic proximately 3 months ago and returning to drinking. He reports consuming approximately 2-3 bottles of wine per day for the last 3 months, but after breaking up last week, he reports drinking approximately 15 bottles of wine over the weekend and his since been trying to cut back with the goal of quitting. He reports drinking some water and this morning at approximately 6 AM, and by approximately 10 AM, he was beginning to feel tremors and sweats, and also experienced acute onset of left-sided chest pain at that time. He attributed this to withdrawal and consumes more alcohol with some incomplete improvement in his symptoms. This evening, chest pain persisted and he developed numbness in the left shoulder which prompted him to seek evaluation. She describes the chest pain as moderate in intensity, dull in character, localized to just under the left breast, without associated nausea or dyspnea, and without any alleviating or exacerbating factors identified. Patient denies history of withdrawal seizures and denies any personal history of known coronary artery disease. He smokes daily and has a family history of heart disease in his paternal grandfather. He reports a history of hypertension for which she has not been on any medications.   ED Course: Upon arrival to the ED, patient is found to be afebrile, saturating well on room  air, and with vital signs stable. EKG demonstrates a normal sinus rhythm with incomplete right bundle branch block and no prior available for comparison. Chest x-ray is negative for acute cardiopulmonary disease. Chemistry panel features a mild hypochloremia and serum glucose 247. CBC is entirely within the normal limits and troponin is reassuring at 0.01. Patient was given 324 mg aspirin in the emergency department. He was treated symptomatically with a GI cocktail, Zofran, and 2 mg oral Ativan. Patient has remained mildly hypertensive in the ED, but otherwise stable. Chest pain has resolved without nitroglycerin. He will be observed on the telemetry unit for ongoing evaluation and management of chest pain, possibly representing ACS, and acute alcohol withdrawal. Ethanol level, LFTs, and lipase remained pending at this time.  Review of Systems:  All other systems reviewed and apart from HPI, are negative.  Past Medical History:  Diagnosis Date  . Hypertension     Past Surgical History:  Procedure Laterality Date  . NO PAST SURGERIES       reports that he has been smoking.  He has never used smokeless tobacco. He reports that he drinks alcohol. He reports that he does not use drugs.  Allergies  Allergen Reactions  . Penicillins   . Statins Other (See Comments)    Chest flutters/arrhythmia    Family History  Problem Relation Age of Onset  . Heart attack Paternal Grandfather      Prior to Admission medications   Medication Sig Start Date End Date Taking? Authorizing Provider  aspirin-sod bicarb-citric acid (ALKA-SELTZER) 325 MG TBEF tablet Take 325 mg by mouth every 6 (six) hours as  needed (heart burn or head ache).   Yes Historical Provider, MD    Physical Exam: Vitals:   10/28/15 1951 10/28/15 2003 10/28/15 2005 10/28/15 2030  BP: 193/94  196/97 185/94  Pulse: 90  91 92  Resp: 13  25 15   Temp: 99.3 F (37.4 C)     TempSrc: Oral     SpO2: 95% 94% 95% 94%  Height: 5\' 11"   (1.803 m)         Constitutional: NAD, in apparent discomfort, anxious Eyes: PERTLA, lids and conjunctivae normal ENMT: Mucous membranes are moist. Posterior pharynx clear of any exudate or lesions.   Neck: normal, supple, no masses, no thyromegaly Respiratory: clear to auscultation bilaterally, no wheezing, no crackles. Normal respiratory effort.   Cardiovascular: S1 & S2 heard, regular rate and rhythm, no significant murmurs. No extremity edema. 2+ pedal pulses. No significant JVD. Abdomen: Mild distension, mild tenderness in RUQ, no masses palpated. Bowel sounds normal.  Musculoskeletal: no clubbing / cyanosis. No joint deformity upper and lower extremities. Normal muscle tone.  Skin: no significant rashes, lesions, ulcers. Warm, dry, well-perfused. Neurologic: CN 2-12 grossly intact. Sensation intact, DTR normal. Strength 5/5 in all 4 limbs.  Psychiatric: Normal judgment and insight. Alert and oriented x 3. Anxious, cooperative.     Labs on Admission: I have personally reviewed following labs and imaging studies  CBC:  Recent Labs Lab 10/28/15 1450  WBC 8.2  HGB 15.9  HCT 44.5  MCV 90.6  PLT 243   Basic Metabolic Panel:  Recent Labs Lab 10/28/15 1450  NA 137  K 3.6  CL 98*  CO2 25  GLUCOSE 247*  BUN 12  CREATININE 0.81  CALCIUM 9.1   GFR: CrCl cannot be calculated (Unknown ideal weight.). Liver Function Tests: No results for input(s): AST, ALT, ALKPHOS, BILITOT, PROT, ALBUMIN in the last 168 hours. No results for input(s): LIPASE, AMYLASE in the last 168 hours. No results for input(s): AMMONIA in the last 168 hours. Coagulation Profile: No results for input(s): INR, PROTIME in the last 168 hours. Cardiac Enzymes: No results for input(s): CKTOTAL, CKMB, CKMBINDEX, TROPONINI in the last 168 hours. BNP (last 3 results) No results for input(s): PROBNP in the last 8760 hours. HbA1C: No results for input(s): HGBA1C in the last 72 hours. CBG: No results for  input(s): GLUCAP in the last 168 hours. Lipid Profile: No results for input(s): CHOL, HDL, LDLCALC, TRIG, CHOLHDL, LDLDIRECT in the last 72 hours. Thyroid Function Tests: No results for input(s): TSH, T4TOTAL, FREET4, T3FREE, THYROIDAB in the last 72 hours. Anemia Panel: No results for input(s): VITAMINB12, FOLATE, FERRITIN, TIBC, IRON, RETICCTPCT in the last 72 hours. Urine analysis:    Component Value Date/Time   COLORURINE YELLOW 03/26/2007 1755   APPEARANCEUR CLEAR 03/26/2007 1755   LABSPEC 1.007 03/26/2007 1755   PHURINE 5.5 03/26/2007 1755   GLUCOSEU NEGATIVE 03/26/2007 1755   HGBUR NEGATIVE 03/26/2007 1755   BILIRUBINUR NEGATIVE 03/26/2007 1755   KETONESUR NEGATIVE 03/26/2007 1755   PROTEINUR NEGATIVE 03/26/2007 1755   UROBILINOGEN 0.2 03/26/2007 1755   NITRITE NEGATIVE 03/26/2007 1755   LEUKOCYTESUR  03/26/2007 1755    NEGATIVE MICROSCOPIC NOT DONE ON URINES WITH NEGATIVE PROTEIN, BLOOD, LEUKOCYTES, NITRITE, OR GLUCOSE <1000 mg/dL.   Sepsis Labs: @LABRCNTIP (procalcitonin:4,lacticidven:4) )No results found for this or any previous visit (from the past 240 hour(s)).   Radiological Exams on Admission: Dg Chest 2 View  Result Date: 10/28/2015 CLINICAL DATA:  Left-sided chest pain EXAM: CHEST  2 VIEW  COMPARISON:  04/18/2007 FINDINGS: The heart size and mediastinal contours are within normal limits. Both lungs are clear. The visualized skeletal structures are unremarkable. IMPRESSION: No acute abnormality noted. Electronically Signed   By: Alcide Clever M.D.   On: 10/28/2015 16:28    EKG: Independently reviewed. Normal sinus rhythm, incomplete RBBB, no prior available  Assessment/Plan  1. Chest pain  - Atypical for cardiac pain, but has HTN, smoking, FHx, and unknown DM and HLD status - Suspect this is more-likely to be GI in nature, but pt is too high-risk for ACS to rule-out in ED - Initial EKG incomplete RBBB, no prior for comparison - Initial troponin reassuring at  0.01 - Monitor on telemetry for ischemic changes, cycle serial troponin measurements, repeat EKG in am (sooner for angina)  - ASA 324 mg given in ED; Lopressor started for associated hypertension; statin precluded by prior intolerance  - Further risk-stratify with A1c and lipid panel  - NTG prn CP with morphine prn pain not relieved by NTG  2. Alcoholism with acute withdrawal  - Pt had maintained abstinence for 8 yrs before relapsing 3 mos ago - Now drinking very heavily for past 5 days and presents with withdrawal sxs  - EtOH level remains pending; pt is not clinically intoxicated on admission  - Pt desires abstinence, seems determined to quit, but will likely need help medically given the degree of use  - Monitor with CIWA and prn Ativan  - Daily vitamins and minerals  - Social work consultation requested   3. Hypertension  - Moderately elevated in ED  - Pt reports hx of hypertension but has not been under treatment for this  - Lopressor started at 12.5 mg BID in setting of possible ACS    4. Hyperglycemia  - Serum glucose 247 on admission with no hx of DM  - Possibly d/t alcohol abuse vs undiagnosed DM/glucose-intolerance  - Check A1c; check CBG with meals and qHS and institute a low-intensity SSI correctional    5. Tobacco abuse - Counseled toward cessation  - Nicotine patch provided  - RN asked to provide smoking-cessation information prior to discharge    DVT prophylaxis: sq Lovenox  Code Status: Full  Family Communication: Discussed with patient Disposition Plan: Observe on telemetry  Consults called: None Admission status: Observation    Briscoe Deutscher, MD [facility] Pager [phone]  If 7PM-7AM, please contact night-coverage [email or web address] Password [email or web address]  10/28/2015, 9:12 PM

## 2015-10-28 NOTE — ED Provider Notes (Signed)
[facility]-EMERGENCY DEPT Provider Note   CSN: [id number] Arrival date & time: 10/28/15  1412     History   Chief Complaint Chief Complaint  Patient presents with  . Chest Pain  . Numbness    HPI Zachary [name] is a 55 y.o. male with a PMHx of HTN (but not on any medications), and alcoholism (sober x53yrs, relapsed 3 months ago), who presents to the ED with complaints of  left-sided chest pain that began while at rest around 10 AM. Patient states that he has been binge drinking recently, felt like he was coming down from the alcohol around 10 AM so he had 4 beers at lunch time around noon, but continued to have some chest pain that he came to the emergency room. He describes the pain as 6/10 constant dull left-sided chest pain that is nonradiating, with no known aggravating factors, unchanged with exertion or inspiration, and with no treatments tried prior to arrival. He states that for the last 1 week he had had heartburn, but this chest pain was slightly different. Associated symptoms include paresthesias of the left arm that have since resolved, states he punched the truck he was in earlier and wasn't sure if maybe he "jarred something in the arm" when he did that. Denies pain in the hand/arm. He also reports some associated nausea. He feels like he is withdrawing from alcohol, was given Ativan in triage and states that he feels some improvement in his symptoms. He was sober for the last 8 years, but he started dating someone 3 months ago and began drinking daily, recently broke up with her so he has spent the last weekend binge drinking. He has at least 2 bottles of wine per night. He has required hospitalization for detox in the past, but denies history of DTs or withdrawal seizures. Denies SI/HI/AVH, was a drug use, or smoking. Positive family history of MI in his father and paternal grandfather. He has no PCP at this time. He takes no medications on a regular basis, but checks his blood pressure  at home and it typically runs in the 150s/90s range. Does not take anything for BP.  He denies fevers, chills, diaphoresis, headache, lightheadedness, shortness of breath, leg swelling, recent travel/surgery/immobilization, personal or family history of DVT/PE, abdominal pain, vomiting, diarrhea, constipation, dysuria, hematuria, numbness, weakness, claudication, or orthopnea.   The history is provided by the patient. No language interpreter was used.  Chest Pain   This is a new problem. The current episode started 6 to 12 hours ago. The problem occurs constantly. The problem has been gradually improving. The pain is associated with rest. The pain is present in the lateral region. The pain is at a severity of 6/10. The pain is moderate. The quality of the pain is described as dull. The pain does not radiate. Exacerbated by: nothing. Associated symptoms include nausea. Pertinent negatives include no abdominal pain, no claudication, no diaphoresis, no fever, no headaches, no lower extremity edema, no numbness, no orthopnea, no shortness of breath, no vomiting and no weakness. He has tried nothing for the symptoms. The treatment provided no relief. Risk factors include alcohol intake, male gender and obesity.  His past medical history is significant for hypertension.  His family medical history is significant for CAD.    History reviewed. No pertinent past medical history.  There are no active problems to display for this patient.   History reviewed. No pertinent surgical history.     Home Medications  Prior to Admission medications   Not on File    Family History History reviewed. No pertinent family history.  Social History Social History  Substance Use Topics  . Smoking status: Current Some Day Smoker  . Smokeless tobacco: Never Used  . Alcohol use Yes     Comment: daily     Allergies   Penicillins   Review of Systems Review of Systems  Constitutional: Negative for  chills, diaphoresis and fever.  Eyes: Negative for visual disturbance.  Respiratory: Negative for shortness of breath.   Cardiovascular: Positive for chest pain. Negative for orthopnea, claudication and leg swelling.  Gastrointestinal: Positive for nausea. Negative for abdominal pain, constipation, diarrhea and vomiting.       +heartburn  Genitourinary: Negative for dysuria and hematuria.  Musculoskeletal: Negative for arthralgias and myalgias.  Skin: Negative for color change.  Allergic/Immunologic: Negative for immunocompromised state.  Neurological: Negative for weakness, light-headedness, numbness and headaches.       +L arm paresthesia, resolved  Psychiatric/Behavioral: Negative for confusion, hallucinations and suicidal ideas.       +EtOH use   10 Systems reviewed and are negative for acute change except as noted in the HPI.   Physical Exam Updated Vital Signs BP 193/94 (BP Location: Right Arm)   Pulse 90   Temp 99.3 F (37.4 C) (Oral)   Resp 13   Ht 5\' 11"  (1.803 m)   SpO2 95%   Physical Exam  Constitutional: He is oriented to person, place, and time. Vital signs are normal. He appears well-developed and well-nourished.  Non-toxic appearance. No distress.  Afebrile, nontoxic, NAD, although HTN noted in the 190s/90s range  HENT:  Head: Normocephalic and atraumatic.  Mouth/Throat: Oropharynx is clear and moist and mucous membranes are normal.  Eyes: Conjunctivae and EOM are normal. Right eye exhibits no discharge. Left eye exhibits no discharge.  Neck: Normal range of motion. Neck supple.  Cardiovascular: Normal rate, regular rhythm, normal heart sounds and intact distal pulses.  Exam reveals no gallop and no friction rub.   No murmur heard. RRR, nl s1/s2, no m/r/g, distal pulses intact, no pedal edema   Pulmonary/Chest: Effort normal and breath sounds normal. No respiratory distress. He has no decreased breath sounds. He has no wheezes. He has no rhonchi. He has no rales.  He exhibits no tenderness, no crepitus, no deformity and no retraction.  CTAB in all lung fields, no w/r/r, no hypoxia or increased WOB, speaking in full sentences, SpO2 95% on RA  Chest wall nonTTP without crepitus, deformities, or retractions   Abdominal: Soft. Normal appearance and bowel sounds are normal. He exhibits no distension. There is tenderness in the epigastric area. There is no rigidity, no rebound, no guarding, no CVA tenderness, no tenderness at McBurney's point and negative Murphy's sign.    Soft, nondistended, +BS throughout, with mild epigastric TTP, no r/g/r, neg murphy's, neg mcburney's, no CVA TTP   Musculoskeletal: Normal range of motion.       Left hand: Normal.  MAE x4 Strength and sensation grossly intact Distal pulses intact No pedal edema L hand without swelling or bruising, nonTTP, without crepitus or deformities  Neurological: He is alert and oriented to person, place, and time. He has normal strength. No sensory deficit.  Skin: Skin is warm, dry and intact. No rash noted.  Psychiatric: He has a normal mood and affect. He is not actively hallucinating. He expresses no homicidal and no suicidal ideation. He expresses no suicidal plans and no  homicidal plans.  Denies SI/HI/AVH. Pleasant and cooperative.  Nursing note and vitals reviewed.   19:05:34 CIWA Alcohol Scale MI  CIWA-Ar  Nausea and Vomiting: mild nausea with no vomiting  Tactile Disturbances: none  Tremor: two  Auditory Disturbances: not present  Paroxysmal Sweats: barely perceptible sweating, palms moist  Visual Disturbances: not present  Anxiety: two  Headache, Fullness in Head: mild  Agitation: two  Orientation and Clouding of Sensorium: oriented and can do serial additions  CIWA-Ar Total: 10    ED Treatments / Results  Labs (all labs ordered are listed, but only abnormal results are displayed) Labs Reviewed  BASIC METABOLIC PANEL - Abnormal; Notable for the following:       Result Value    Chloride 98 (*)    Glucose, Bld 247 (*)    All other components within normal limits  CBC  ETHANOL  HEPATIC FUNCTION PANEL  LIPASE, BLOOD  I-STAT TROPOININ, ED  I-STAT TROPOININ, ED    EKG  EKG Interpretation  Date/Time:  Wednesday October 28 2015 14:23:33 EDT Ventricular Rate:  94 PR Interval:  132 QRS Duration: 104 QT Interval:  368 QTC Calculation: 460 R Axis:   -25 Text Interpretation:  Normal sinus rhythm Incomplete right bundle branch block Borderline ECG No significant change since last tracing Confirmed by LIU MD, DANA [phone]([id number]) on 10/28/2015 8:27:09 PM       Radiology Dg Chest 2 View  Result Date: 10/28/2015 CLINICAL DATA:  Left-sided chest pain EXAM: CHEST  2 VIEW COMPARISON:  04/18/2007 FINDINGS: The heart size and mediastinal contours are within normal limits. Both lungs are clear. The visualized skeletal structures are unremarkable. IMPRESSION: No acute abnormality noted. Electronically Signed   By: Alcide CleverMark  Lukens M.D.   On: 10/28/2015 16:28    Procedures Procedures (including critical care time)  Medications Ordered in ED Medications  gi cocktail (Maalox,Lidocaine,Donnatal) (not administered)  aspirin chewable tablet 324 mg (not administered)  nitroGLYCERIN (NITROSTAT) SL tablet 0.4 mg (not administered)  ondansetron (ZOFRAN) injection 4 mg (not administered)  LORazepam (ATIVAN) injection 0-4 mg (not administered)    Followed by  LORazepam (ATIVAN) injection 0-4 mg (not administered)  LORazepam (ATIVAN) tablet 0-4 mg (not administered)    Followed by  LORazepam (ATIVAN) tablet 0-4 mg (not administered)  thiamine (VITAMIN B-1) tablet 100 mg (not administered)    Or  thiamine (B-1) injection 100 mg (not administered)  LORazepam (ATIVAN) tablet 2 mg (2 mg Oral Given 10/28/15 1939)     Initial Impression / Assessment and Plan / ED Course  I have reviewed the triage vital signs and the nursing notes.  Pertinent labs & imaging results that were available  during my care of the patient were reviewed by me and considered in my medical decision making (see chart for details).  Clinical Course    55 y.o. male here with CP and L arm paresthesias that occurred earlier, and EtOH withdrawals. CIWA 10, given ativan earlier which he states is helping. Complains that he has heartburn, and epigastrum is tender. Neg murphy's. Chest pain is not reproducible. No leg swelling, no tachycardia or hypoxia, doubt DVT/PE, doubt dissection. Trop neg at [facility]5hr mark, will repeat now since it's been 5hrs since it was done. BMP with hyperglycemia 247, pt doesn't have hx of DM2 but with a random CBG of this level, it's likely he has DM2. Will add-on lipase and LFTs given the epigastrum TTP. Will also obtain EtOH level. CBC WNL. CXR neg. EKG without acute ischemic  findings. Given significant withdrawal symptoms, and moderate HEART score due to comorbids and family hx, will admit for chest pain r/o and detox management. Denies SI/HI/AVH, doubt need for psych involvement at this time. Will give ASA, GI cocktail, zofran, and NTG for symptoms. Pt already given ativan an hr ago, CIWA orders in place. Will reassess after consult return page. Discussed case with my attending Dr. Verdie MosherLiu who agrees with plan.   8:44 PM [facility], will admit for chest pain r/o and detox management. Tele obs holding orders placed. Please see his notes for further documentation of care/dispo. I appreciate his help with this patient's care! Pt stable at this time. No further labs resulted at time of admission.   Final Clinical Impressions(s) / ED Diagnoses   Final diagnoses:  Atypical chest pain  Paresthesia  HTN (hypertension), benign  Alcohol withdrawal, uncomplicated (HCC)  Nausea  Indigestion  Hyperglycemia    New Prescriptions New Prescriptions   No medications on file     Aaylah Pokorny Camprubi-Soms, PA-C 10/28/15 2046    Lavera Guiseana Duo Liu, MD 10/29/15 [phone]

## 2015-10-28 NOTE — ED Triage Notes (Addendum)
Pt reports hx of acid reflux, has been drinking heavily recently. Reports onset of chest pain yesterday. Today is having numbness sensation to left arm. Reports not drinking for years. Went through a breakup recently and started drinking heavily since Friday. Pt reports feeling hopeless but denies SI or HI. Temp 100 at triage, reports being outside in heat all day.

## 2015-10-28 NOTE — ED Notes (Signed)
Attempted Report 

## 2015-10-29 ENCOUNTER — Encounter (HOSPITAL_COMMUNITY): Payer: Self-pay | Admitting: General Practice

## 2015-10-29 DIAGNOSIS — I10 Essential (primary) hypertension: Secondary | ICD-10-CM

## 2015-10-29 DIAGNOSIS — R739 Hyperglycemia, unspecified: Secondary | ICD-10-CM

## 2015-10-29 DIAGNOSIS — F1010 Alcohol abuse, uncomplicated: Secondary | ICD-10-CM

## 2015-10-29 DIAGNOSIS — F10230 Alcohol dependence with withdrawal, uncomplicated: Secondary | ICD-10-CM

## 2015-10-29 LAB — BASIC METABOLIC PANEL
ANION GAP: 8 (ref 5–15)
BUN: 11 mg/dL (ref 6–20)
CO2: 26 mmol/L (ref 22–32)
Calcium: 8.9 mg/dL (ref 8.9–10.3)
Chloride: 100 mmol/L — ABNORMAL LOW (ref 101–111)
Creatinine, Ser: 0.86 mg/dL (ref 0.61–1.24)
Glucose, Bld: 104 mg/dL — ABNORMAL HIGH (ref 65–99)
POTASSIUM: 4.1 mmol/L (ref 3.5–5.1)
SODIUM: 134 mmol/L — AB (ref 135–145)

## 2015-10-29 LAB — GLUCOSE, CAPILLARY
GLUCOSE-CAPILLARY: 128 mg/dL — AB (ref 65–99)
Glucose-Capillary: 134 mg/dL — ABNORMAL HIGH (ref 65–99)
Glucose-Capillary: 98 mg/dL (ref 65–99)
Glucose-Capillary: 116 mg/dL — ABNORMAL HIGH (ref 65–99)

## 2015-10-29 LAB — TSH: TSH: 2.221 u[IU]/mL (ref 0.350–4.500)

## 2015-10-29 LAB — LIPID PANEL
Cholesterol: 309 mg/dL — ABNORMAL HIGH (ref 0–200)
HDL: 94 mg/dL (ref >40)
LDL CALC: 184 mg/dL — AB (ref 0–99)
Total CHOL/HDL Ratio: 3.3 RATIO
Triglycerides: 153 mg/dL — ABNORMAL HIGH (ref <150)
VLDL: 31 mg/dL (ref 0–40)

## 2015-10-29 LAB — TROPONIN I

## 2015-10-29 NOTE — Progress Notes (Signed)
[facility] PROGRESS NOTE  Zachary [name] ZOX:[id number] DOB: [birth year] DOA: 10/28/2015 PCP: No primary care provider on file.   Today: Cont to cycle trop. Poss DC home. ECHO. Will discuss with cardiology, possible consult.  Assessment/Plan:  1. Chest pain  - Atypical for cardiac pain, but has HTN, smoking, FHx, and unknown DM and HLD status - Suspect this is more-likely to be GI in nature, but pt is too high-risk for ACS to rule-out in ED - Initial EKG incomplete RBBB, no prior for comparison - Initial troponins reassuring at <0.03 x2 - Monitor on telemetry for ischemic changes, cycle serial troponin measurements, repeat EKG in am (sooner for angina)  - ASA 324 mg given in ED; Lopressor started for associated hypertension; statin precluded by prior intolerance  - Further risk-stratify with A1c and lipid panel  - NTG prn CP with morphine prn pain not relieved by NTG  2. Alcoholism with acute withdrawal  - Pt had maintained abstinence for 8 yrs before relapsing 3 mos ago - Now drinking very heavily for past 5 days and presents with withdrawal sxs  - EtOH level remains pending; pt is not clinically intoxicated on admission  - Pt desires abstinence, seems determined to quit, but will likely need help medically given the degree of use  - Monitor with CIWA and prn Ativan  - Daily vitamins and minerals  - Social work consultation requested   3. Hypertension  - Moderately elevated in ED  - Pt reports hx of hypertension but has not been under treatment for this  - Lopressor started at 12.5 mg BID in setting of possible ACS    4. Hyperglycemia  - Serum glucose 247 on admission with no hx of DM  - Possibly d/t alcohol abuse vs undiagnosed DM/glucose-intolerance  - Checking A1c-->P - check CBG with meals and qHS and institute a low-intensity SSI correctional    5. Tobacco abuse - Counseled toward cessation  - Nicotine patch provided  - RN asked to provide smoking-cessation  information prior to discharge    DVT prophylaxis: sq Lovenox  Code Status: Full  Family Communication: Discussed with patient Disposition Plan: Observe on telemetry  Consults called: None Admission status: Observation  Consultants:  n/a  Procedures:  none  Antibiotics:  none (indicate start date, and stop date if known)  HPI/Subjective:  No events per nursing. Denies CP ongoing, only when anxious. No SOB. Walks well per nursing.   Objective: Vitals:   10/29/15 0501 10/29/15 0816  BP: (!) 178/82 (!) 176/98  Pulse: 63 74  Resp: 18   Temp: 98.3 F (36.8 C)     Intake/Output Summary (Last 24 hours) at 10/29/15 1057 Last data filed at 10/29/15 0900  Gross per 24 hour  Intake              526 ml  Output              625 ml  Net              -99 ml   Filed Weights   10/28/15 2155 10/29/15 0501  Weight: 87.9 kg (193 lb 12.6 oz) 87.8 kg (193 lb 8 oz)    Exam:  General:  No diaphoresis, very anxious, no acute distress Cardiovascular: Regular rate and rhythm no murmurs rubs or gallops Respiratory: Clear to auscultation bilaterally no more breathing Abdomen: Nondistended bowel sounds normal nontender palpation Musculoskeletal: Moving all extremities, no deformity, 5 out of 5 strength   Data Reviewed:  Basic Metabolic Panel:  Recent Labs Lab 10/28/15 1450 10/29/15 0417  NA 137 134*  K 3.6 4.1  CL 98* 100*  CO2 25 26  GLUCOSE 247* 104*  BUN 12 11  CREATININE 0.81 0.86  CALCIUM 9.1 8.9   Liver Function Tests:  Recent Labs Lab 10/28/15 2101  AST 41  ALT 55  ALKPHOS 92  BILITOT 0.5  PROT 6.8  ALBUMIN 4.2    Recent Labs Lab 10/28/15 2101  LIPASE 23   No results for input(s): AMMONIA in the last 168 hours. CBC:  Recent Labs Lab 10/28/15 1450  WBC 8.2  HGB 15.9  HCT 44.5  MCV 90.6  PLT 243   Cardiac Enzymes:  Recent Labs Lab 10/28/15 2315 10/29/15 0417  TROPONINI <0.03 <0.03   BNP (last 3 results)  Recent Labs   10/28/15 2315  BNP 4.8    ProBNP (last 3 results) No results for input(s): PROBNP in the last 8760 hours.  CBG:  Recent Labs Lab 10/29/15 0800  GLUCAP 128*    No results found for this or any previous visit (from the past 240 hour(s)).   Studies: Dg Chest 2 View  Result Date: 10/28/2015 CLINICAL DATA:  Left-sided chest pain EXAM: CHEST  2 VIEW COMPARISON:  04/18/2007 FINDINGS: The heart size and mediastinal contours are within normal limits. Both lungs are clear. The visualized skeletal structures are unremarkable. IMPRESSION: No acute abnormality noted. Electronically Signed   By: Alcide CleverMark  Lukens M.D.   On: 10/28/2015 16:28    Scheduled Meds: . aspirin EC  81 mg Oral Daily  . enoxaparin (LOVENOX) injection  40 mg Subcutaneous QHS  . famotidine (PEPCID) IV  20 mg Intravenous Q12H  . folic acid  1 mg Oral Daily  . insulin aspart  0-9 Units Subcutaneous TID WC  . LORazepam  0-4 mg Intravenous Q6H   Followed by  . [START ON 10/31/2015] LORazepam  0-4 mg Intravenous Q12H  . metoprolol tartrate  12.5 mg Oral BID  . multivitamin with minerals  1 tablet Oral Daily  . nicotine  14 mg Transdermal Daily  . thiamine  100 mg Oral Daily   Or  . thiamine  100 mg Intravenous Daily   Continuous Infusions:   Principal Problem:   Chest pain Active Problems:   Hypertension   Alcohol abuse   Hyperglycemia   Alcohol withdrawal (HCC)   Tobacco abuse   Chest pain at rest   HTN (hypertension), benign    Time spent: 25    Zachary [name]  [facility] Pager 25. If 7PM-7AM, please contact night-coverage at [email or web address], password [email or web address] 10/29/2015, 10:57 AM  LOS: 0 days

## 2015-10-30 ENCOUNTER — Other Ambulatory Visit: Payer: Self-pay

## 2015-10-30 DIAGNOSIS — R0789 Other chest pain: Principal | ICD-10-CM

## 2015-10-30 LAB — GLUCOSE, CAPILLARY
GLUCOSE-CAPILLARY: 107 mg/dL — AB (ref 65–99)
Glucose-Capillary: 114 mg/dL — ABNORMAL HIGH (ref 65–99)
Glucose-Capillary: 122 mg/dL — ABNORMAL HIGH (ref 65–99)

## 2015-10-30 LAB — BASIC METABOLIC PANEL
ANION GAP: 8 (ref 5–15)
BUN: 13 mg/dL (ref 6–20)
CALCIUM: 9.2 mg/dL (ref 8.9–10.3)
CHLORIDE: 104 mmol/L (ref 101–111)
CO2: 24 mmol/L (ref 22–32)
Creatinine, Ser: 0.82 mg/dL (ref 0.61–1.24)
GFR calc non Af Amer: >60 mL/min (ref >60)
GLUCOSE: 96 mg/dL (ref 65–99)
Potassium: 3.9 mmol/L (ref 3.5–5.1)
Sodium: 136 mmol/L (ref 135–145)

## 2015-10-30 LAB — HEMOGLOBIN A1C
HEMOGLOBIN A1C: 5.3 % (ref 4.8–5.6)
Mean Plasma Glucose: 105 mg/dL

## 2015-10-30 MED ORDER — METOPROLOL TARTRATE 25 MG PO TABS: 12.5000 mg | ORAL_TABLET | Freq: Two times a day (BID) | ORAL | 0 refills | Status: DC

## 2015-10-30 MED ORDER — ADULT MULTIVITAMIN W/MINERALS CH: 1.0000 | ORAL_TABLET | Freq: Every day | ORAL | 0 refills | Status: DC

## 2015-10-30 MED ORDER — CHLORDIAZEPOXIDE HCL 25 MG PO CAPS: 25.0000 mg | ORAL_CAPSULE | Freq: Three times a day (TID) | ORAL | 0 refills | Status: DC | PRN

## 2015-10-30 MED ORDER — FOLIC ACID 1 MG PO TABS
1.0000 mg | ORAL_TABLET | Freq: Every day | ORAL | 0 refills | Status: DC
Start: 2015-10-30 — End: 2016-06-21

## 2015-10-30 MED ORDER — NICOTINE 14 MG/24HR TD PT24: 14.0000 mg | MEDICATED_PATCH | Freq: Every day | TRANSDERMAL | 0 refills | Status: DC

## 2015-10-30 NOTE — Progress Notes (Signed)
Discharge instructions reviewed with the patient and patient verbalized understanding.  The patient's tele removed, Central Telemetry notified and IV removed.  No further follow up at this time.  Patient discharged.

## 2015-10-30 NOTE — Discharge Summary (Signed)
Physician Discharge Summary  Zachary [name] Heinecke VWU:[id number] DOB: Aug 12, 1960 DOA: 10/28/2015  PCP: No primary care provider on file.  Admit date: 10/28/2015 Discharge date: 10/30/2015  Time spent: 35 minutes  Recommendations for Outpatient Follow-up:  1. 8/25 OP provider f/u   Discharge Diagnoses:  Principal Problem:   Chest pain Active Problems:   Hypertension   Alcohol abuse   Hyperglycemia   Alcohol withdrawal (HCC)   Tobacco abuse   Chest pain at rest   HTN (hypertension), benign   Discharge Condition: stable  Diet recommendation: Low salt diet  Filed Weights   10/28/15 2155 10/29/15 0501 10/30/15 0520  Weight: 87.9 kg (193 lb 12.6 oz) 87.8 kg (193 lb 8 oz) 86.9 kg (191 lb 9.6 oz)    History of present illness:  Zachary [name] Zachary [name] is a 55 y.o. male with medical history significant for hypertension and alcoholism only, though he does not see a physician regularly, now presenting to the emergency department for evaluation of chest pain and alcohol withdrawal. Patient reports that he had maintained abstinence from alcohol for 8 years before entering into a relationship with an alcoholic proximately 3 months ago and returning to drinking. He reports consuming approximately 2-3 bottles of wine per day for the last 3 months, but after breaking up last week, he reports drinking approximately 15 bottles of wine over the weekend and his since been trying to cut back with the goal of quitting. He reports drinking some water and this morning at approximately 6 AM, and by approximately 10 AM, he was beginning to feel tremors and sweats, and also experienced acute onset of left-sided chest pain at that time. He attributed this to withdrawal and consumes more alcohol with some incomplete improvement in his symptoms. This evening, chest pain persisted and he developed numbness in the left shoulder which prompted him to seek evaluation. She describes the chest pain as moderate in intensity,  dull in character, localized to just under the left breast, without associated nausea or dyspnea, and without any alleviating or exacerbating factors identified. Patient denies history of withdrawal seizures and denies any personal history of known coronary artery disease. He smokes daily and has a family history of heart disease in his paternal grandfather. He reports a history of hypertension for which she has not been on any medications.    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 He will enroll himself. Patient will follow up with cardiology on his own, opted not to see cardiology as an inpatient.  Procedures: None Consultations:  none  Discharge Exam: Vitals:   10/30/15 0520 10/30/15 1400  BP: (!) 155/86 (!) 161/89  Pulse: 62 72  Resp: 18 20  Temp: 97.7 F (36.5 C) 98.4 F (36.9 C)     General:  No diaphoresis, anxious, no acute distress  Cardiovascular: Regular rate and rhythm no murmurs rubs or gallops  Respiratory: Clear to auscultation bilaterally no more breathing  Abdomen: Nondistended bowel sounds normal nontender palpation  Musculoskeletal: Moving all extremities, no deformity, 5 out of 5 strength    Discharge Instructions    Discharge Medication List as of 10/30/2015  5:27 PM    START taking these medications   Details  chlordiazePOXIDE (LIBRIUM) 25 MG capsule Take 1 capsule (25 mg total) by mouth 3 (three) times daily as needed for withdrawal. Take three times a day for first 3 days, Starting Fri 10/30/2015, Print  folic acid (FOLVITE) 1 MG tablet Take 1 tablet (1 mg total) by mouth daily., Starting Fri 10/30/2015, Normal    metoprolol tartrate (LOPRESSOR) 25 MG tablet Take 0.5 tablets (12.5 mg total) by mouth 2 (two) times daily., Starting Fri 10/30/2015, Until Fri 11/13/2015, Normal    Multiple Vitamin (MULTIVITAMIN WITH MINERALS) TABS  tablet Take 1 tablet by mouth daily., Starting Fri 10/30/2015, Print    nicotine (NICODERM CQ - DOSED IN MG/24 HOURS) 14 mg/24hr patch Place 1 patch (14 mg total) onto the skin daily., Starting Fri 10/30/2015, Normal      CONTINUE these medications which have NOT CHANGED   Details  aspirin-sod bicarb-citric acid (ALKA-SELTZER) 325 MG TBEF tablet Take 325 mg by mouth every 6 (six) hours as needed (heart burn or head ache)., Historical Med       Allergies  Allergen Reactions  . Penicillins   . Statins Other (See Comments)    Chest flutters/arrhythmia   Follow-up Information    [facility] .   Why:  Alcohol withdrawal & chest pain hospital follow-up Contact information: [name] [facility] [place] [id number] [phone]           The results of significant diagnostics from this hospitalization (including imaging, microbiology, ancillary and laboratory) are listed below for reference.    Significant Diagnostic Studies: Dg Chest 2 View  Result Date: 10/28/2015 CLINICAL DATA:  Left-sided chest pain EXAM: CHEST  2 VIEW COMPARISON:  04/18/2007 FINDINGS: The heart size and mediastinal contours are within normal limits. Both lungs are clear. The visualized skeletal structures are unremarkable. IMPRESSION: No acute abnormality noted. Electronically Signed   By: Alcide CleverMark  Lukens M.D.   On: 10/28/2015 16:28    Microbiology: No results found for this or any previous visit (from the past 240 hour(s)).   Labs: Basic Metabolic Panel:  Recent Labs Lab 10/28/15 1450 10/29/15 0417 10/30/15 0210  NA 137 134* 136  K 3.6 4.1 3.9  CL 98* 100* 104  CO2 25 26 24   GLUCOSE 247* 104* 96  BUN 12 11 13   CREATININE 0.81 0.86 0.82  CALCIUM 9.1 8.9 9.2   Liver Function Tests:  Recent Labs Lab 10/28/15 2101  AST 41  ALT 55  ALKPHOS 92  BILITOT 0.5  PROT 6.8  ALBUMIN 4.2    Recent Labs Lab 10/28/15 2101  LIPASE 23   No results for  input(s): AMMONIA in the last 168 hours. CBC:  Recent Labs Lab 10/28/15 1450  WBC 8.2  HGB 15.9  HCT 44.5  MCV 90.6  PLT 243   Cardiac Enzymes:  Recent Labs Lab 10/28/15 2315 10/29/15 0417 10/29/15 1010  TROPONINI <0.03 <0.03 <0.03   BNP: BNP (last 3 results)  Recent Labs  10/28/15 2315  BNP 4.8    ProBNP (last 3 results) No results for input(s): PROBNP in the last 8760 hours.  CBG:  Recent Labs Lab 10/29/15 1501 10/29/15 1717 10/29/15 2132 10/30/15 0554 10/30/15 1135  GLUCAP 134* 98 116* 114* 122*    [place] and in the last 18 months the patient has had 0 prescriptions.    Signed:  Haydee SalterPhillip M Hobbs MD  FACP  [facility] 10/30/2015, 3:22 PM

## 2015-10-30 NOTE — Discharge Instructions (Addendum)
Alcohol Withdrawal Alcohol withdrawal is a group of symptoms that can develop when a person who drinks heavily and regularly stops drinking or drinks less. CAUSES Heavy and regular drinking can cause chemicals that send signals from the brain to the body (neurotransmitters) to deactivate. Alcohol withdrawal develops when deactivated neurotransmitters reactivate because a person stops drinking or drinks less. RISK FACTORS The more a person drinks and the longer he or she drinks, the greater the risk of alcohol withdrawal. Severe withdrawal is more likely to develop in someone who:  Had severe alcohol withdrawal in the past.  Had a seizure during a previous episode of alcohol withdrawal.  Is elderly.  Is pregnant.  Has been abusing drugs.  Has other medical problems, including:  Infection.  Heart, lung, or liver disease.  Seizures.  Mental health problems. SYMPTOMS Symptoms of this condition can be mild to moderate, or they can be severe. Mild to moderate symptoms may include:  Fatigue.  Nightmares.  Trouble sleeping.  Depression.  Anxiety.  Inability to think clearly.  Mood swings.  Irritability.  Loss of appetite.  Nausea or vomiting.  Clammy skin.  Extreme sweating.  Rapid heartbeat.  Shakiness.  Uncontrollable shaking (tremor). Severe symptoms may include:  Fever.  Seizures.  Severeconfusion.  Feeling or seeing things that are not there (hallucinations). Symptoms usually begin within eight hours after a person stops drinking or drinks less. They can last for weeks. DIAGNOSIS Alcohol withdrawal is diagnosed with a medical history and physical exam. Sometimes, urine and blood tests are also done. TREATMENT Treatment may involve:  Monitoring blood pressure, pulse, and breathing.  Getting fluids through an IV tube.  Medicine to reduce anxiety.  Medicine to prevent or control seizures.  Multivitamins and B vitamins.  Having a health  care provider check on you daily. If symptoms are moderate to severe or if there is a risk of severe withdrawal, treatment may be done at a hospital or treatment center. HOME CARE INSTRUCTIONS  Take medicines and vitamin supplements only as directed by your health care provider.  Do not drink alcohol.  Have someone stay with you or be available if you need help.  Drink enough fluid to keep your urine clear or pale yellow.  Consider joining a 12-step program or another alcohol support group. SEEK MEDICAL CARE IF:  Your symptoms get worse or do not go away.  You cannot keep food or water in your stomach.  You are struggling with not drinking alcohol.  You cannot stop drinking alcohol. SEEK IMMEDIATE MEDICAL CARE IF:   You have an irregular heartbeat.  You have chest pain.  You have trouble breathing.  You have symptoms of severe withdrawal, such as:  A fever.  Seizures.  Severe confusion.  Hallucinations.   This information is not intended to replace advice given to you by your health care provider. Make sure you discuss any questions you have with your health care provider.   Document Released: 12/08/2004 Document Revised: 03/21/2014 Document Reviewed: 12/17/2013 Elsevier Interactive Patient Education 2016 Elsevier Inc.  Chest Pain Observation It is often hard to give a specific diagnosis for the cause of chest pain. Among other possibilities your symptoms might be caused by inadequate oxygen delivery to your heart (angina). Angina that is not treated or evaluated can lead to a heart attack (myocardial infarction) or death. Blood tests, electrocardiograms, and X-rays may have been done to help determine a possible cause of your chest pain. After evaluation and observation, your health care  provider has determined that it is unlikely your pain was caused by an unstable condition that requires hospitalization. However, a full evaluation of your pain may need to be  completed, with additional diagnostic testing as directed. It is very important to keep your follow-up appointments. Not keeping your follow-up appointments could result in permanent heart damage, disability, or death. If there is any problem keeping your follow-up appointments, you must call your health care provider. HOME CARE INSTRUCTIONS  Due to the slight chance that your pain could be angina, it is important to follow your health care provider's treatment plan and also maintain a healthy lifestyle:  Maintain or work toward achieving a healthy weight.  Stay physically active and exercise regularly.  Decrease your salt intake.  Eat a balanced, healthy diet. Talk to a dietitian to learn about heart-healthy foods.  Increase your fiber intake by including whole grains, vegetables, fruits, and nuts in your diet.  Avoid situations that cause stress, anger, or depression.  Take medicines as advised by your health care provider. Report any side effects to your health care provider. Do not stop medicines or adjust the dosages on your own.  Quit smoking. Do not use nicotine patches or gum until you check with your health care provider.  Keep your blood pressure, blood sugar, and cholesterol levels within normal limits.  Limit alcohol intake to no more than 1 drink per day for women who are not pregnant and 2 drinks per day for men.  Do not abuse drugs. SEEK IMMEDIATE MEDICAL CARE IF: You have severe chest pain or pressure which may include symptoms such as:  You feel pain or pressure in your arms, neck, jaw, or back.  You have severe back or abdominal pain, feel sick to your stomach (nauseous), or throw up (vomit).  You are sweating profusely.  You are having a fast or irregular heartbeat.  You feel short of breath while at rest.  You notice increasing shortness of breath during rest, sleep, or with activity.  You have chest pain that does not get better after rest or after taking  your usual medicine.  You wake from sleep with chest pain.  You are unable to sleep because you cannot breathe.  You develop a frequent cough or you are coughing up blood.  You feel dizzy, faint, or experience extreme fatigue.  You develop severe weakness, dizziness, fainting, or chills. Any of these symptoms may represent a serious problem that is an emergency. Do not wait to see if the symptoms will go away. Call your local emergency services (911 in the [place] Do not drive yourself to the hospital. MAKE SURE YOU:  Understand these instructions.  Will watch your condition.  Will get help right away if you are not doing well or get worse.   This information is not intended to replace advice given to you by your health care provider. Make sure you discuss any questions you have with your health care provider.   Document Released: 04/02/2010 Document Revised: 03/05/2013 Document Reviewed: 08/30/2012 Elsevier Interactive Patient Education [employer]2016 Elsevier Inc.   Talk to your primary care doctor about further cardiac workup.

## 2015-11-06 ENCOUNTER — Inpatient Hospital Stay: Payer: Self-pay

## 2016-03-27 ENCOUNTER — Encounter (HOSPITAL_COMMUNITY): Payer: Self-pay | Admitting: Emergency Medicine

## 2016-03-27 ENCOUNTER — Emergency Department (HOSPITAL_COMMUNITY)
Admission: EM | Admit: 2016-03-27 | Discharge: 2016-03-27 | Disposition: A | Discharge status: Home / Self Care | Payer: Self-pay | Attending: Emergency Medicine | Admitting: Emergency Medicine

## 2016-03-27 DIAGNOSIS — Z7982 Long term (current) use of aspirin: Secondary | ICD-10-CM | POA: Insufficient documentation

## 2016-03-27 DIAGNOSIS — Y929 Unspecified place or not applicable: Secondary | ICD-10-CM | POA: Insufficient documentation

## 2016-03-27 DIAGNOSIS — F10230 Alcohol dependence with withdrawal, uncomplicated: Secondary | ICD-10-CM | POA: Insufficient documentation

## 2016-03-27 DIAGNOSIS — Z79899 Other long term (current) drug therapy: Secondary | ICD-10-CM | POA: Insufficient documentation

## 2016-03-27 DIAGNOSIS — Y939 Activity, unspecified: Secondary | ICD-10-CM | POA: Insufficient documentation

## 2016-03-27 DIAGNOSIS — X500XXA Overexertion from strenuous movement or load, initial encounter: Secondary | ICD-10-CM | POA: Insufficient documentation

## 2016-03-27 DIAGNOSIS — M545 Low back pain: Secondary | ICD-10-CM | POA: Insufficient documentation

## 2016-03-27 DIAGNOSIS — Y990 Civilian activity done for income or pay: Secondary | ICD-10-CM | POA: Insufficient documentation

## 2016-03-27 DIAGNOSIS — F10930 Alcohol use, unspecified with withdrawal, uncomplicated: Secondary | ICD-10-CM

## 2016-03-27 DIAGNOSIS — I10 Essential (primary) hypertension: Secondary | ICD-10-CM | POA: Insufficient documentation

## 2016-03-27 DIAGNOSIS — F17210 Nicotine dependence, cigarettes, uncomplicated: Secondary | ICD-10-CM | POA: Insufficient documentation

## 2016-03-27 MED ORDER — NAPROXEN 500 MG PO TABS: 500.0000 mg | ORAL_TABLET | Freq: Two times a day (BID) | ORAL | 0 refills | Status: DC

## 2016-03-27 MED ORDER — LORAZEPAM 2 MG/ML IJ SOLN: 1.0000 mg | INTRAMUSCULAR | Status: DC | PRN

## 2016-03-27 MED ORDER — VITAMIN B-1 100 MG PO TABS
500.0000 mg | ORAL_TABLET | Freq: Once | ORAL | Status: AC
  Administered 2016-03-27: 500 mg via ORAL
  Filled 2016-03-27: qty 5

## 2016-03-27 MED ORDER — METHOCARBAMOL 500 MG PO TABS: 500.0000 mg | ORAL_TABLET | Freq: Three times a day (TID) | ORAL | 0 refills | Status: DC | PRN

## 2016-03-27 MED ORDER — SODIUM CHLORIDE 0.9 % IV BOLUS (SEPSIS)
1000.0000 mL | Freq: Once | INTRAVENOUS | Status: AC
  Administered 2016-03-27: 1000 mL via INTRAVENOUS

## 2016-03-27 MED ORDER — METHOCARBAMOL 1000 MG/10ML IJ SOLN
1000.0000 mg | Freq: Once | INTRAVENOUS | Status: AC
  Administered 2016-03-27: 1000 mg via INTRAVENOUS
  Filled 2016-03-27: qty 10

## 2016-03-27 MED ORDER — CHLORDIAZEPOXIDE HCL 25 MG PO CAPS: ORAL_CAPSULE | ORAL | 0 refills | Status: DC

## 2016-03-27 MED ORDER — ADULT MULTIVITAMIN W/MINERALS CH
1.0000 | ORAL_TABLET | Freq: Once | ORAL | Status: AC
  Administered 2016-03-27: 1 via ORAL
  Filled 2016-03-27: qty 1

## 2016-03-27 MED ORDER — KETOROLAC TROMETHAMINE 30 MG/ML IJ SOLN
30.0000 mg | Freq: Once | INTRAMUSCULAR | Status: AC
  Administered 2016-03-27: 30 mg via INTRAVENOUS
  Filled 2016-03-27: qty 1

## 2016-03-27 MED ORDER — ONDANSETRON HCL 4 MG/2ML IJ SOLN
4.0000 mg | Freq: Once | INTRAMUSCULAR | Status: AC
  Administered 2016-03-27: 4 mg via INTRAVENOUS
  Filled 2016-03-27: qty 2

## 2016-03-27 MED ORDER — DIAZEPAM 5 MG PO TABS
10.0000 mg | ORAL_TABLET | Freq: Once | ORAL | Status: AC
  Administered 2016-03-27: 10 mg via ORAL
  Filled 2016-03-27: qty 2

## 2016-03-27 NOTE — ED Notes (Signed)
Bed: WA17 Expected date:  Expected time:  Means of arrival:  Comments: 56 yo back pain; ETOH detox

## 2016-03-27 NOTE — ED Triage Notes (Signed)
Patient states that he pulled his back out last week and has been drinking ETOH to cope with pain.  Patient ran out of ETOH as to why he came in here. Last drink was around 4am this morning.

## 2016-03-27 NOTE — ED Triage Notes (Signed)
Per EMS patient comes from home for lower back pain that started on Monday. Patient also c/o ETOH withdrawal.  Reports last drink was during the night. 198/84, HR 100 CBG 145.

## 2016-03-27 NOTE — ED Provider Notes (Signed)
[facility]-EMERGENCY DEPT Provider Note   CSN: [id number] Arrival date & time: 03/27/16  0740     History   Chief Complaint Chief Complaint  Patient presents with  . Back Pain  . Alcohol Problem    HPI Zachary [name] is a 56 y.o. male.  Histor:y pleasant 56 year old male who presents with complaint of back pain.  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 A lot of lifting and bending and work on his hands and knees. He started to experience some pain in his low back worsening each day over several days. It is right sided intermittent. Much worse with movement.  Patient is very open about a history of alcoholism. He was clean for 8 years. Started drinking about 3 months ago. He admits that he has been drinking more than usual over the last 3-4 days "self-medicating" because of the pain. Pain is localized. It does not radiate into his legs. No bowel or bladder symptoms. He states he awakened this morning and was having some pain. He realized that he was likely in withdrawal, as his hands were shaking. It had been several hours since his last drink.  HPI  Past Medical History:  Diagnosis Date  . Alcohol abuse   . Hypertension     Patient Active Problem List   Diagnosis Date Noted  . Chest pain 10/28/2015  . Hypertension 10/28/2015  . Alcohol abuse 10/28/2015  . Hyperglycemia 10/28/2015  . Alcohol withdrawal (HCC) 10/28/2015  . Tobacco abuse 10/28/2015  . Chest pain at rest 10/28/2015  . HTN (hypertension), benign     Past Surgical History:  Procedure Laterality Date  . COLONOSCOPY    . MOUTH SURGERY    . NO PAST SURGERIES         Home Medications    Prior to Admission medications   Medication Sig Start Date End Date Taking? Authorizing Provider  aspirin-sod bicarb-citric acid (ALKA-SELTZER) 325 MG TBEF tablet Take 325 mg by mouth every 6 (six) hours as needed (heart burn or  head ache).   Yes Historical Provider, MD  ibuprofen (ADVIL,MOTRIN) 200 MG tablet Take 800 mg by mouth every 6 (six) hours as needed.   Yes Historical Provider, MD  Multiple Vitamin (MULTIVITAMIN WITH MINERALS) TABS tablet Take 1 tablet by mouth daily. 10/30/15  Yes Haydee Salter, MD  chlordiazePOXIDE (LIBRIUM) 25 MG capsule 50mg  PO TID x 1D, then 25-50mg  PO BID X 1D, then 25-50mg  PO QD X 1D 03/27/16   Rolland Porter, MD  folic acid (FOLVITE) 1 MG tablet Take 1 tablet (1 mg total) by mouth daily. Patient not taking: Reported on 03/27/2016 10/30/15   Haydee Salter, MD  methocarbamol (ROBAXIN) 500 MG tablet Take 1 tablet (500 mg total) by mouth 3 (three) times daily between meals as needed. 03/27/16   Rolland Porter, MD  metoprolol tartrate (LOPRESSOR) 25 MG tablet Take 0.5 tablets (12.5 mg total) by mouth 2 (two) times daily. 10/30/15 11/13/15  Haydee Salter, MD  naproxen (NAPROSYN) 500 MG tablet Take 1 tablet (500 mg total) by mouth 2 (two) times daily. 03/27/16   Rolland Porter, MD  nicotine (NICODERM CQ - DOSED IN MG/24 HOURS) 14 mg/24hr patch Place 1 patch (14 mg total) onto the skin daily. Patient not taking: Reported on 03/27/2016 10/30/15   Haydee Salter, MD    Family History Family History  Problem Relation Age  of Onset  . Heart attack Paternal Grandfather     Social History Social History  Substance Use Topics  . Smoking status: Current Some Day Smoker    Types: Cigarettes  . Smokeless tobacco: Never Used  . Alcohol use Yes     Comment: daily     Allergies   Penicillins and Statins   Review of Systems Review of Systems  Constitutional: Negative for appetite change, chills, diaphoresis, fatigue and fever.  HENT: Negative for mouth sores, sore throat and trouble swallowing.   Eyes: Negative for visual disturbance.  Respiratory: Negative for cough, chest tightness, shortness of breath and wheezing.   Cardiovascular: Negative for chest pain.  Gastrointestinal: Negative for abdominal  distention, abdominal pain, diarrhea, nausea and vomiting.  Endocrine: Negative for polydipsia, polyphagia and polyuria.  Genitourinary: Negative for dysuria, frequency and hematuria.  Musculoskeletal: Positive for arthralgias and back pain. Negative for gait problem.  Skin: Negative for color change, pallor and rash.  Neurological: Positive for tremors. Negative for dizziness, syncope, light-headedness and headaches.  Hematological: Does not bruise/bleed easily.  Psychiatric/Behavioral: Negative for behavioral problems and confusion.     Physical Exam Updated Vital Signs BP 167/97 (BP Location: Right Arm)   Pulse 98   Temp 98.3 F (36.8 C) (Oral)   Resp 18   Ht 5\' 11"  (1.803 m)   SpO2 96%   Physical Exam  Constitutional: He is oriented to person, place, and time. He appears well-developed and well-nourished. No distress.  Pleasant male laying on his right side. Tremulous hands. Awake and alert. Clinically sober.  HENT:  Head: Normocephalic.  Eyes: Conjunctivae are normal. Pupils are equal, round, and reactive to light. No scleral icterus.  Neck: Normal range of motion. Neck supple. No thyromegaly present.  Cardiovascular: Normal rate and regular rhythm.  Exam reveals no gallop and no friction rub.   No murmur heard. Pulmonary/Chest: Effort normal and breath sounds normal. No respiratory distress. He has no wheezes. He has no rales.  Abdominal: Soft. Bowel sounds are normal. He exhibits no distension. There is no tenderness. There is no rebound.  Musculoskeletal: Normal range of motion.       Back:  Neurological: He is alert and oriented to person, place, and time.  Normal symmetric strength to flex/.extend hip and knees, dorsi/plantar flex ankles. Normal symmetric sensation to all distributions to LEs Patellar and achilles reflexes 1-2+. Downgoing Babinski   Skin: Skin is warm and dry. No rash noted.  Psychiatric: He has a normal mood and affect. His behavior is normal.      ED Treatments / Results  Labs (all labs ordered are listed, but only abnormal results are displayed) Labs Reviewed - No data to display  EKG  EKG Interpretation None       Radiology No results found.  Procedures Procedures (including critical care time)  Medications Ordered in ED Medications  LORazepam (ATIVAN) injection 1 mg (not administered)  ondansetron (ZOFRAN) injection 4 mg (4 mg Intravenous Given 03/27/16 0855)  diazepam (VALIUM) tablet 10 mg (10 mg Oral Given 03/27/16 0849)  ketorolac (TORADOL) 30 MG/ML injection 30 mg (30 mg Intravenous Given 03/27/16 0855)  methocarbamol (ROBAXIN) 1,000 mg in dextrose 5 % 50 mL IVPB (0 mg Intravenous Stopped 03/27/16 0940)  sodium chloride 0.9 % bolus 1,000 mL (0 mLs Intravenous Stopped 03/27/16 1025)  multivitamin with minerals tablet 1 tablet (1 tablet Oral Given 03/27/16 0848)  thiamine (VITAMIN B-1) tablet 500 mg (500 mg Oral Given 03/27/16 0849)  Initial Impression / Assessment and Plan / ED Course  I have reviewed the triage vital signs and the nursing notes.  Pertinent labs & imaging results that were available during my care of the patient were reviewed by me and considered in my medical decision making (see chart for details).  Clinical Course     Patient given by mouth and IV meds. Including by mouth Valium. IV Toradol. IV Robaxin. Symptoms improved markedly. His withdrawl improved.  Final Clinical Impressions(s) / ED Diagnoses   Final diagnoses:  Acute right-sided low back pain, with sciatica presence unspecified  Alcohol withdrawal syndrome without complication [facility](HCC)    Patient appears much better. Taking some by mouth. Appears quite well. Plan will be tapered dose Librium. Naproxen,and Robaxin. Primary care follow-up.  New Prescriptions New Prescriptions   CHLORDIAZEPOXIDE (LIBRIUM) 25 MG CAPSULE    50mg  PO TID x 1D, then 25-50mg  PO BID X 1D, then 25-50mg  PO QD X 1D   METHOCARBAMOL (ROBAXIN) 500 MG  TABLET    Take 1 tablet (500 mg total) by mouth 3 (three) times daily between meals as needed.   NAPROXEN (NAPROSYN) 500 MG TABLET    Take 1 tablet (500 mg total) by mouth 2 (two) times daily.     Rolland PorterMark Moesha Sarchet, MD 03/27/16 1147

## 2016-04-12 ENCOUNTER — Emergency Department (HOSPITAL_COMMUNITY): Payer: Self-pay

## 2016-04-12 ENCOUNTER — Emergency Department (HOSPITAL_COMMUNITY)
Admission: EM | Admit: 2016-04-12 | Discharge: 2016-04-12 | Disposition: A | Discharge status: Home / Self Care | Payer: Self-pay | Attending: Emergency Medicine | Admitting: Emergency Medicine

## 2016-04-12 ENCOUNTER — Encounter (HOSPITAL_COMMUNITY): Payer: Self-pay | Admitting: Emergency Medicine

## 2016-04-12 DIAGNOSIS — Z7982 Long term (current) use of aspirin: Secondary | ICD-10-CM | POA: Insufficient documentation

## 2016-04-12 DIAGNOSIS — F10920 Alcohol use, unspecified with intoxication, uncomplicated: Secondary | ICD-10-CM

## 2016-04-12 DIAGNOSIS — F17210 Nicotine dependence, cigarettes, uncomplicated: Secondary | ICD-10-CM | POA: Insufficient documentation

## 2016-04-12 DIAGNOSIS — M545 Low back pain, unspecified: Secondary | ICD-10-CM

## 2016-04-12 DIAGNOSIS — I10 Essential (primary) hypertension: Secondary | ICD-10-CM | POA: Insufficient documentation

## 2016-04-12 DIAGNOSIS — F10120 Alcohol abuse with intoxication, uncomplicated: Secondary | ICD-10-CM | POA: Insufficient documentation

## 2016-04-12 LAB — COMPREHENSIVE METABOLIC PANEL
ALK PHOS: 85 U/L (ref 38–126)
ALT: 34 U/L (ref 17–63)
ANION GAP: 10 (ref 5–15)
AST: 26 U/L (ref 15–41)
Albumin: 3.9 g/dL (ref 3.5–5.0)
BILIRUBIN TOTAL: 0.5 mg/dL (ref 0.3–1.2)
BUN: <5 mg/dL — ABNORMAL LOW (ref 6–20)
CALCIUM: 8.2 mg/dL — AB (ref 8.9–10.3)
CO2: 30 mmol/L (ref 22–32)
Chloride: 91 mmol/L — ABNORMAL LOW (ref 101–111)
Creatinine, Ser: 0.76 mg/dL (ref 0.61–1.24)
GFR calc non Af Amer: >60 mL/min (ref >60)
Glucose, Bld: 156 mg/dL — ABNORMAL HIGH (ref 65–99)
POTASSIUM: 2.7 mmol/L — AB (ref 3.5–5.1)
Sodium: 131 mmol/L — ABNORMAL LOW (ref 135–145)
TOTAL PROTEIN: 6.6 g/dL (ref 6.5–8.1)

## 2016-04-12 LAB — URINALYSIS, ROUTINE W REFLEX MICROSCOPIC
BILIRUBIN URINE: NEGATIVE
Bacteria, UA: NONE SEEN
Glucose, UA: 150 mg/dL — AB
KETONES UR: NEGATIVE mg/dL
LEUKOCYTES UA: NEGATIVE
NITRITE: NEGATIVE
PROTEIN: NEGATIVE mg/dL
SPECIFIC GRAVITY, URINE: 1.009 (ref 1.005–1.030)
Squamous Epithelial / LPF: NONE SEEN
pH: 8 (ref 5.0–8.0)

## 2016-04-12 LAB — CBC WITH DIFFERENTIAL/PLATELET
Basophils Absolute: 0 10*3/uL (ref 0.0–0.1)
Basophils Relative: 0 %
EOS PCT: 1 %
Eosinophils Absolute: 0.1 10*3/uL (ref 0.0–0.7)
HCT: 40.3 % (ref 39.0–52.0)
Hemoglobin: 15 g/dL (ref 13.0–17.0)
LYMPHS ABS: 1.3 10*3/uL (ref 0.7–4.0)
Lymphocytes Relative: 17 %
MCH: 32 pg (ref 26.0–34.0)
MCHC: 37.2 g/dL — AB (ref 30.0–36.0)
MCV: 85.9 fL (ref 78.0–100.0)
MONO ABS: 1.1 10*3/uL — AB (ref 0.1–1.0)
MONOS PCT: 14 %
NEUTROS ABS: 5.2 10*3/uL (ref 1.7–7.7)
NEUTROS PCT: 68 %
PLATELETS: 256 10*3/uL (ref 150–400)
RBC: 4.69 MIL/uL (ref 4.22–5.81)
RDW: 12.7 % (ref 11.5–15.5)
WBC: 7.7 10*3/uL (ref 4.0–10.5)

## 2016-04-12 LAB — LIPASE, BLOOD: Lipase: 18 U/L (ref 11–51)

## 2016-04-12 LAB — CBC
HCT: 40.5 % (ref 39.0–52.0)
HEMOGLOBIN: 14.9 g/dL (ref 13.0–17.0)
MCH: 32.3 pg (ref 26.0–34.0)
MCHC: 36.8 g/dL — ABNORMAL HIGH (ref 30.0–36.0)
MCV: 87.9 fL (ref 78.0–100.0)
Platelets: 265 10*3/uL (ref 150–400)
RBC: 4.61 MIL/uL (ref 4.22–5.81)
RDW: 12.9 % (ref 11.5–15.5)
WBC: 8.1 10*3/uL (ref 4.0–10.5)

## 2016-04-12 LAB — ETHANOL: Alcohol, Ethyl (B): 186 mg/dL — ABNORMAL HIGH (ref <5)

## 2016-04-12 LAB — MAGNESIUM: Magnesium: 1.9 mg/dL (ref 1.7–2.4)

## 2016-04-12 MED ORDER — POTASSIUM CHLORIDE CRYS ER 20 MEQ PO TBCR: 20.0000 meq | EXTENDED_RELEASE_TABLET | Freq: Every day | ORAL | 0 refills | Status: DC

## 2016-04-12 MED ORDER — CHLORDIAZEPOXIDE HCL 25 MG PO CAPS: ORAL_CAPSULE | ORAL | 0 refills | Status: DC

## 2016-04-12 MED ORDER — LORAZEPAM 2 MG/ML IJ SOLN
1.0000 mg | Freq: Once | INTRAMUSCULAR | Status: AC
  Administered 2016-04-12: 1 mg via INTRAVENOUS
  Filled 2016-04-12: qty 1

## 2016-04-12 MED ORDER — SODIUM CHLORIDE 0.9 % IV BOLUS (SEPSIS)
1000.0000 mL | Freq: Once | INTRAVENOUS | Status: AC
  Administered 2016-04-12: 1000 mL via INTRAVENOUS

## 2016-04-12 MED ORDER — CHLORDIAZEPOXIDE HCL 25 MG PO CAPS
75.0000 mg | ORAL_CAPSULE | Freq: Once | ORAL | Status: AC
  Administered 2016-04-12: 75 mg via ORAL
  Filled 2016-04-12: qty 3

## 2016-04-12 MED ORDER — POTASSIUM CHLORIDE CRYS ER 20 MEQ PO TBCR
40.0000 meq | EXTENDED_RELEASE_TABLET | Freq: Once | ORAL | Status: AC
  Administered 2016-04-12: 40 meq via ORAL
  Filled 2016-04-12: qty 2

## 2016-04-12 MED ORDER — NAPROXEN 500 MG PO TABS: 500.0000 mg | ORAL_TABLET | Freq: Two times a day (BID) | ORAL | 0 refills | Status: DC

## 2016-04-12 MED ORDER — ONDANSETRON 4 MG PO TBDP
4.0000 mg | ORAL_TABLET | Freq: Once | ORAL | Status: AC | PRN
  Administered 2016-04-12: 4 mg via ORAL
  Filled 2016-04-12: qty 1

## 2016-04-12 MED ORDER — POTASSIUM CHLORIDE 10 MEQ/100ML IV SOLN
10.0000 meq | INTRAVENOUS | Status: AC
  Administered 2016-04-12 (×3): 10 meq via INTRAVENOUS
  Filled 2016-04-12 (×3): qty 100

## 2016-04-12 MED ORDER — METHOCARBAMOL 500 MG PO TABS: 500.0000 mg | ORAL_TABLET | Freq: Three times a day (TID) | ORAL | 0 refills | Status: DC

## 2016-04-12 MED ORDER — SODIUM CHLORIDE 0.9 % IV SOLN: 30.0000 meq | Freq: Once | INTRAVENOUS | Status: DC

## 2016-04-12 MED ORDER — KETOROLAC TROMETHAMINE 30 MG/ML IJ SOLN
30.0000 mg | Freq: Once | INTRAMUSCULAR | Status: AC
  Administered 2016-04-12: 30 mg via INTRAVENOUS
  Filled 2016-04-12: qty 1

## 2016-04-12 NOTE — ED Notes (Signed)
Went to call Patient ffrom lobby, patient was found lying in floor beside wheelchair. Patient states that he can't sit up in wheelchair so had to lay in floor.  Patient assisted up by this RN and Gregary SignsSean with Security. Patient was ambulatory to triage with one assist for stability.

## 2016-04-12 NOTE — ED Notes (Signed)
Patient provided bus pass upon d/c. Ambulatory with steady gait in NAD.

## 2016-04-12 NOTE — ED Notes (Signed)
Potassium 2.7

## 2016-04-12 NOTE — Discharge Instructions (Signed)
You are given resources for alcohol withdrawal.  Your back pain is still consistent with low back sprain. Take OTC antiinflammatory medication (like naprosyn), ice or use heat packs, avoid prolonged bed/couch immobilization.

## 2016-04-12 NOTE — ED Provider Notes (Signed)
[facility]-EMERGENCY DEPT Provider Note   CSN: [id number] Arrival date & time: 04/12/16  1203     History   Chief Complaint Chief Complaint  Patient presents with  . Back Pain  . Alcohol Intoxication  . Emesis    HPI Georgina Peerorman E Candelaria is a 56 y.o. male.  HPI  56 year old male who presents with low back pain and etoh intoxication History of chronic alcohol abuse, reported relapse since June, drinking 0.5 to 1 pint per day, plus or minus beer. 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 Seen in ED 1/14 for this and sent home with librium for detox, antiinflammatory medications and muscle relaxants. He states this does not help and he still has persistent pain. Drinking more because of pain. Last drink this AM, and think he is going through withdrawal and requesting detox. Seeing chiropracter for back. Nausea and vomiting this morning which he states is related to withdrawal. Intermittent abdominal cramping but no abdominal pain now. No retention of urine. No saddle anesthesia. No bowel incontinence. No numbness/weakness in LE extremities. No fever or chills.   Past Medical History:  Diagnosis Date  . Alcohol abuse   . Hypertension     Patient Active Problem List   Diagnosis Date Noted  . Chest pain 10/28/2015  . Hypertension 10/28/2015  . Alcohol abuse 10/28/2015  . Hyperglycemia 10/28/2015  . Alcohol withdrawal (HCC) 10/28/2015  . Tobacco abuse 10/28/2015  . Chest pain at rest 10/28/2015  . HTN (hypertension), benign     Past Surgical History:  Procedure Laterality Date  . COLONOSCOPY    . MOUTH SURGERY    . NO PAST SURGERIES         Home Medications    Prior to Admission medications   Medication Sig Start Date End Date Taking? Authorizing Provider  aspirin-sod bicarb-citric acid (ALKA-SELTZER) 325 MG TBEF tablet Take 325 mg by mouth every 6 (six) hours as needed (heart burn or head ache).   Yes Historical Provider, MD  chlordiazePOXIDE  (LIBRIUM) 25 MG capsule 50mg  PO TID x 1D, then 25-50mg  PO BID X 1D, then 25-50mg  PO QD X 1D 03/27/16  Yes Rolland PorterMark James, MD  ibuprofen (ADVIL,MOTRIN) 200 MG tablet Take 800 mg by mouth every 6 (six) hours as needed for moderate pain.    Yes Historical Provider, MD  Multiple Vitamin (MULTIVITAMIN WITH MINERALS) TABS tablet Take 1 tablet by mouth daily. 10/30/15  Yes Haydee SalterPhillip M Hobbs, MD  naproxen (NAPROSYN) 500 MG tablet Take 1 tablet (500 mg total) by mouth 2 (two) times daily. 03/27/16  Yes Rolland PorterMark James, MD  chlordiazePOXIDE (LIBRIUM) 25 MG capsule 50mg  PO TID x 1D, then 25-50mg  PO BID X 1D, then 25-50mg  PO QD X 1D 04/12/16   Lavera Guiseana Duo Verl Whitmore, MD  folic acid (FOLVITE) 1 MG tablet Take 1 tablet (1 mg total) by mouth daily. Patient not taking: Reported on 03/27/2016 10/30/15   Haydee SalterPhillip M Hobbs, MD  methocarbamol (ROBAXIN) 500 MG tablet Take 1 tablet (500 mg total) by mouth 3 (three) times daily between meals as needed. Patient not taking: Reported on 04/12/2016 03/27/16   Rolland PorterMark James, MD  methocarbamol (ROBAXIN) 500 MG tablet Take 1 tablet (500 mg total) by mouth 3 (three) times daily. 04/12/16   Lavera Guiseana Duo Kannon Baum, MD  metoprolol tartrate (LOPRESSOR) 25 MG tablet Take 0.5 tablets (12.5 mg total) by mouth 2 (two) times daily. 10/30/15 11/13/15  Haydee SalterPhillip M Hobbs, MD  naproxen (NAPROSYN) 500 MG tablet Take  1 tablet (500 mg total) by mouth 2 (two) times daily. 04/12/16   Lavera Guise, MD  nicotine (NICODERM CQ - DOSED IN MG/24 HOURS) 14 mg/24hr patch Place 1 patch (14 mg total) onto the skin daily. Patient not taking: Reported on 03/27/2016 10/30/15   Haydee Salter, MD  potassium chloride SA (K-DUR,KLOR-CON) 20 MEQ tablet Take 1 tablet (20 mEq total) by mouth daily. 04/12/16   Lavera Guise, MD    Family History Family History  Problem Relation Age of Onset  . Heart attack Paternal Grandfather     Social History Social History  Substance Use Topics  . Smoking status: Current Some Day Smoker    Types: Cigarettes  . Smokeless  tobacco: Never Used  . Alcohol use Yes     Comment: daily     Allergies   Penicillins and Statins   Review of Systems Review of Systems 10/14 systems reviewed and are negative other than those stated in the HPI   Physical Exam Updated Vital Signs BP 136/91   Pulse 81   Temp 97.6 F (36.4 C) (Oral)   Resp 18   Ht 5\' 11"  (1.803 m)   Wt 191 lb 9 oz (86.9 kg)   SpO2 95%   BMI 26.72 kg/m   Physical Exam Physical Exam  Nursing note and vitals reviewed. Constitutional: dissheveled, non-toxic, and in no acute distress Head: Normocephalic and atraumatic.  Mouth/Throat: Oropharynx is clear and moist.  Neck: Normal range of motion. Neck supple.  Cardiovascular: Normal rate and regular rhythm.   Pulmonary/Chest: Effort normal and breath sounds normal.  Abdominal: Soft. There is no tenderness. There is no rebound and no guarding.  Musculoskeletal: Normal range of motion of lower extremities. Tender over right paraspinal low back.  Neurological: Alert, no facial droop, fluent speech, moves all extremities symmetrically, full sensation to light touch in bilateral lower extremities, full strength ankle flexion/extension, knee flexion extension, hip flexion/extension Skin: Skin is warm and dry.  Psychiatric: Cooperative   ED Treatments / Results  Labs (all labs ordered are listed, but only abnormal results are displayed) Labs Reviewed  COMPREHENSIVE METABOLIC PANEL - Abnormal; Notable for the following:       Result Value   Sodium 131 (*)    Potassium 2.7 (*)    Chloride 91 (*)    Glucose, Bld 156 (*)    BUN <5 (*)    Calcium 8.2 (*)    All other components within normal limits  CBC - Abnormal; Notable for the following:    MCHC 36.8 (*)    All other components within normal limits  URINALYSIS, ROUTINE W REFLEX MICROSCOPIC - Abnormal; Notable for the following:    Color, Urine STRAW (*)    Glucose, UA 150 (*)    Hgb urine dipstick SMALL (*)    All other components  within normal limits  ETHANOL - Abnormal; Notable for the following:    Alcohol, Ethyl (B) 186 (*)    All other components within normal limits  CBC WITH DIFFERENTIAL/PLATELET - Abnormal; Notable for the following:    MCHC 37.2 (*)    Monocytes Absolute 1.1 (*)    All other components within normal limits  LIPASE, BLOOD  MAGNESIUM    EKG  EKG Interpretation  Date/Time:  Tuesday April 12 2016 14:50:27 EST Ventricular Rate:  74 PR Interval:    QRS Duration: 125 QT Interval:  406 QTC Calculation: 451 R Axis:   -26 Text Interpretation:  Sinus  rhythm Right bundle branch block Minimal ST elevation, anterior leads similar to prior EKG  Confirmed by Laron Angelini MD, Shereka Lafortune [phone]) on 04/12/2016 3:40:49 PM       Radiology Dg Lumbar Spine Complete  Result Date: 04/12/2016 CLINICAL DATA:  Acute onset of lower back pain.  Initial encounter. EXAM: LUMBAR SPINE - COMPLETE 4+ VIEW COMPARISON:  Abdominal radiograph performed 07/26/2006 FINDINGS: There is no evidence of fracture or subluxation. Vertebral bodies demonstrate normal height and alignment. Mild multilevel disc space narrowing is noted along the lumbar spine, with scattered anterior and lateral osteophytes. Scattered vascular calcification is seen. The visualized bowel gas pattern is unremarkable in appearance; air and stool are noted within the colon. The sacroiliac joints are within normal limits. IMPRESSION: 1. No evidence of fracture or subluxation along the lumbar spine. 2. Mild degenerative change along the lumbar spine. 3. Scattered aortic atherosclerosis. Electronically Signed   By: Roanna Raider M.D.   On: 04/12/2016 16:22    Procedures Procedures (including critical care time)  Medications Ordered in ED Medications  potassium chloride 10 mEq in 100 mL IVPB (10 mEq Intravenous New Bag/Given 04/12/16 1701)  ondansetron (ZOFRAN-ODT) disintegrating tablet 4 mg (4 mg Oral Given 04/12/16 1222)  sodium chloride 0.9 % bolus 1,000 mL (1,000  mLs Intravenous New Bag/Given 04/12/16 1501)  ketorolac (TORADOL) 30 MG/ML injection 30 mg (30 mg Intravenous Given 04/12/16 1501)  LORazepam (ATIVAN) injection 1 mg (1 mg Intravenous Given 04/12/16 1501)  potassium chloride SA (K-DUR,KLOR-CON) CR tablet 40 mEq (40 mEq Oral Given 04/12/16 1505)  chlordiazePOXIDE (LIBRIUM) capsule 75 mg (75 mg Oral Given 04/12/16 1701)     Initial Impression / Assessment and Plan / ED Course  I have reviewed the triage vital signs and the nursing notes.  Pertinent labs & imaging results that were available during my care of the patient were reviewed by me and considered in my medical decision making (see chart for details).     Return to ED with persistent low back pain and requesting detox.  No significant signs of withdrawal on arrival, and given ativan and subsequently librium. With alcohol level of 180, but coherent and ambulating normally.  Right paraspinal low back tenderness w/o midline tenderness. No red flag symptoms and normal neuro exam. Consistent with low back sprain. XR lumbar spine obtained and normal on visualization given 2nd visit. Still within time frame for low back sprain. Discussed continued supportive care. Blood work w/ hypokalemia but normal magnesium. Given repletion and given some supplementation for home. No concerning EKG changes. Discussed continued anti-inflammatory medications and supportive care for home.  Given a repeat course of Librium and given resources for detox facilities for outpatient. Strict return and follow-up instructions reviewed. He expressed understanding of all discharge instructions and felt comfortable with the plan of care.   Final Clinical Impressions(s) / ED Diagnoses   Final diagnoses:  Alcoholic intoxication without complication (HCC)  Acute right-sided low back pain without sciatica    New Prescriptions New Prescriptions   CHLORDIAZEPOXIDE (LIBRIUM) 25 MG CAPSULE    50mg  PO TID x 1D, then 25-50mg  PO  BID X 1D, then 25-50mg  PO QD X 1D   METHOCARBAMOL (ROBAXIN) 500 MG TABLET    Take 1 tablet (500 mg total) by mouth 3 (three) times daily.   NAPROXEN (NAPROSYN) 500 MG TABLET    Take 1 tablet (500 mg total) by mouth 2 (two) times daily.   POTASSIUM CHLORIDE SA (K-DUR,KLOR-CON) 20 MEQ TABLET    Take 1  tablet (20 mEq total) by mouth daily.     Lavera Guise, MD 04/12/16 [phone]

## 2016-04-12 NOTE — ED Triage Notes (Signed)
Patient brought in by EMS from home for back pain that has been going on since Jan 8th so patient has been medicating with ETOH due to medications aren't helping. .  Patient also reports that he wants help with detox from the ETOH.  Patient had 6 pack today and that he went to chiropractor this morning and 3 times last week.

## 2016-04-12 NOTE — ED Notes (Signed)
Patient transported to X-ray 

## 2016-06-21 ENCOUNTER — Emergency Department (HOSPITAL_COMMUNITY)
Admission: EM | Admit: 2016-06-21 | Discharge: 2016-06-21 | Disposition: A | Discharge status: Home / Self Care | Payer: Self-pay | Attending: Emergency Medicine | Admitting: Emergency Medicine

## 2016-06-21 ENCOUNTER — Encounter (HOSPITAL_COMMUNITY): Payer: Self-pay | Admitting: Emergency Medicine

## 2016-06-21 ENCOUNTER — Emergency Department (HOSPITAL_COMMUNITY): Payer: Self-pay

## 2016-06-21 DIAGNOSIS — F17210 Nicotine dependence, cigarettes, uncomplicated: Secondary | ICD-10-CM | POA: Insufficient documentation

## 2016-06-21 DIAGNOSIS — R200 Anesthesia of skin: Secondary | ICD-10-CM

## 2016-06-21 DIAGNOSIS — F10929 Alcohol use, unspecified with intoxication, unspecified: Secondary | ICD-10-CM

## 2016-06-21 DIAGNOSIS — F10129 Alcohol abuse with intoxication, unspecified: Secondary | ICD-10-CM | POA: Insufficient documentation

## 2016-06-21 DIAGNOSIS — Z7982 Long term (current) use of aspirin: Secondary | ICD-10-CM | POA: Insufficient documentation

## 2016-06-21 DIAGNOSIS — Z79899 Other long term (current) drug therapy: Secondary | ICD-10-CM | POA: Insufficient documentation

## 2016-06-21 DIAGNOSIS — I10 Essential (primary) hypertension: Secondary | ICD-10-CM | POA: Insufficient documentation

## 2016-06-21 LAB — RAPID URINE DRUG SCREEN, HOSP PERFORMED
AMPHETAMINES: NOT DETECTED
BENZODIAZEPINES: NOT DETECTED
Barbiturates: NOT DETECTED
Cocaine: NOT DETECTED
Opiates: NOT DETECTED
TETRAHYDROCANNABINOL: NOT DETECTED

## 2016-06-21 LAB — BASIC METABOLIC PANEL
Anion gap: 8 (ref 5–15)
BUN: 9 mg/dL (ref 6–20)
CHLORIDE: 104 mmol/L (ref 101–111)
CO2: 24 mmol/L (ref 22–32)
Calcium: 9.1 mg/dL (ref 8.9–10.3)
Creatinine, Ser: 0.78 mg/dL (ref 0.61–1.24)
GFR calc Af Amer: >60 mL/min (ref >60)
GLUCOSE: 119 mg/dL — AB (ref 65–99)
Potassium: 4.1 mmol/L (ref 3.5–5.1)
Sodium: 136 mmol/L (ref 135–145)

## 2016-06-21 LAB — CBC
HEMATOCRIT: 48.5 % (ref 39.0–52.0)
Hemoglobin: 18.1 g/dL — ABNORMAL HIGH (ref 13.0–17.0)
MCH: 34.2 pg — AB (ref 26.0–34.0)
MCHC: 37.3 g/dL — AB (ref 30.0–36.0)
MCV: 91.5 fL (ref 78.0–100.0)
Platelets: 259 10*3/uL (ref 150–400)
RBC: 5.3 MIL/uL (ref 4.22–5.81)
RDW: 12.9 % (ref 11.5–15.5)
WBC: 9.6 10*3/uL (ref 4.0–10.5)

## 2016-06-21 LAB — I-STAT TROPONIN, ED: Troponin i, poc: 0 ng/mL (ref 0.00–0.08)

## 2016-06-21 LAB — URINALYSIS, ROUTINE W REFLEX MICROSCOPIC
Bilirubin Urine: NEGATIVE
GLUCOSE, UA: NEGATIVE mg/dL
Hgb urine dipstick: NEGATIVE
Ketones, ur: NEGATIVE mg/dL
Leukocytes, UA: NEGATIVE
Nitrite: NEGATIVE
PH: 5 (ref 5.0–8.0)
Protein, ur: NEGATIVE mg/dL
SPECIFIC GRAVITY, URINE: 1.004 — AB (ref 1.005–1.030)

## 2016-06-21 LAB — ETHANOL: ALCOHOL ETHYL (B): 138 mg/dL — AB (ref <5)

## 2016-06-21 MED ORDER — THIAMINE HCL 100 MG/ML IJ SOLN
100.0000 mg | Freq: Once | INTRAMUSCULAR | Status: AC
  Administered 2016-06-21: 100 mg via INTRAVENOUS
  Filled 2016-06-21: qty 2

## 2016-06-21 MED ORDER — VITAMIN B-1 100 MG PO TABS: 100.0000 mg | ORAL_TABLET | Freq: Every day | ORAL | 0 refills | Status: DC

## 2016-06-21 MED ORDER — GI COCKTAIL ~~LOC~~
30.0000 mL | Freq: Once | ORAL | Status: AC
  Administered 2016-06-21: 30 mL via ORAL
  Filled 2016-06-21: qty 30

## 2016-06-21 MED ORDER — SODIUM CHLORIDE 0.9 % IV BOLUS (SEPSIS)
500.0000 mL | Freq: Once | INTRAVENOUS | Status: AC
  Administered 2016-06-21: 500 mL via INTRAVENOUS

## 2016-06-21 MED ORDER — MULTIVITAMINS PO CAPS: 1.0000 | ORAL_CAPSULE | Freq: Every day | ORAL | 0 refills | Status: DC

## 2016-06-21 MED ORDER — CHLORDIAZEPOXIDE HCL 25 MG PO CAPS
ORAL_CAPSULE | ORAL | 0 refills | Status: DC
Start: 2016-06-21 — End: 2016-08-07

## 2016-06-21 MED ORDER — SODIUM CHLORIDE 0.9 % IV SOLN
INTRAVENOUS | Status: DC
  Administered 2016-06-21: 18:00:00 via INTRAVENOUS

## 2016-06-21 NOTE — ED Triage Notes (Signed)
Pt reported that he called EMS due to nausea (denies vomiting) and headache. Pt stated that he wants to detox. Pt is alert, oriented and appropriate. Stated that he has had 24 -7 oz. beers in last 18 hours.

## 2016-06-21 NOTE — ED Provider Notes (Signed)
Patient presented to the emergency room with complaints of alcohol abuse, headaches and left-sided hot. Patient was evaluated by Dr. Effie Shy initially. His laboratory tests show an elevated serum ethanol level. His head CT does not show any acute abnormalities.  Patient has been monitored in the emergency room. I do not see any focal deficits on my exam. He has no facial droop. He does have sensation on the left side of his face. It's possible his paresthesias can be related to thiamine deficiency considering his alcohol consumption. I'll have the patient start on a multivitamin.  Overall have low suspicion for occult stroke. However we did discuss the importance of follow-up with his symptoms persist.  Patient denies any suicidal ideation. He is able to contract for safety. I have provided him with outpatient resources for his alcohol consumption. I will give him a prescription for Librium to help with withdrawal symptoms. Patient is comfortable with this.   Linwood Dibbles, MD 06/21/16 [phone]

## 2016-06-21 NOTE — ED Triage Notes (Signed)
Per EMS- pt called EMS with c/o headache x 4 days. Pt stated that the l/side of head felt hot. C/o nausea. No neuro deficit noted. Pt is alert, oriented and ambulatory. Pt stated that he has 5 beers today.Pt reports anxiety. Pt told EMS that he felt suicidal yesterday, but not today. Denies plan.

## 2016-06-21 NOTE — ED Provider Notes (Signed)
[facility]-EMERGENCY DEPT Provider Note   CSN: [id number] Arrival date & time: 06/21/16  1404     History   Chief Complaint Chief Complaint  Patient presents with  . Headache  . Nausea  . Gastroesophageal Reflux    HPI Zachary [name] is a 56 y.o. male.  He presents for evaluation of alcoholism, and "facial numbness".  He noticed facial numbness starting this morning.  He states that it "feels hot", and is located in his left forehead and cheek. He denies headache, fever, chills, nausea, vomiting, or back pain.  He is concerned that he may have had a stroke.  His mother had a stroke, and died of a heart attack.  He lives alone.  States that when he stops drinking, he gets shaky, but has never had a seizure.  He reported to EMS that had thought about suicide yesterday, but denies suicidal ideation today.  There are no other known modifying factors.  HPI  Past Medical History:  Diagnosis Date  . Alcohol abuse   . Hypertension     Patient Active Problem List   Diagnosis Date Noted  . Chest pain 10/28/2015  . Hypertension 10/28/2015  . Alcohol abuse 10/28/2015  . Hyperglycemia 10/28/2015  . Alcohol withdrawal (HCC) 10/28/2015  . Tobacco abuse 10/28/2015  . Chest pain at rest 10/28/2015  . HTN (hypertension), benign     Past Surgical History:  Procedure Laterality Date  . COLONOSCOPY    . MOUTH SURGERY    . NO PAST SURGERIES         Home Medications    Prior to Admission medications   Medication Sig Start Date End Date Taking? Authorizing Provider  aspirin-sod bicarb-citric acid (ALKA-SELTZER) 325 MG TBEF tablet Take 325 mg by mouth every 6 (six) hours as needed (for heartburn/indigestion).    Yes Historical Provider, MD  ibuprofen (ADVIL,MOTRIN) 200 MG tablet Take 800 mg by mouth every 6 (six) hours as needed for fever, headache, mild pain or moderate pain.    Yes Historical Provider, MD    Family History Family History  Problem Relation Age of Onset  . Heart  attack Paternal Grandfather   . Stroke Mother   . Heart failure Mother   . Heart failure Father     Social History Social History  Substance Use Topics  . Smoking status: Current Some Day Smoker    Packs/day: 2.00    Types: Cigarettes  . Smokeless tobacco: Never Used  . Alcohol use Yes     Comment: daily     Allergies   Penicillins and Statins   Review of Systems Review of Systems  All other systems reviewed and are negative.    Physical Exam Updated Vital Signs BP 135/74 (BP Location: Right Arm)   Pulse 79   Temp 98.2 F (36.8 C) (Oral)   Resp 18   SpO2 93%   Physical Exam  Constitutional: He is oriented to person, place, and time. He appears well-developed.  overweight  HENT:  Head: Normocephalic and atraumatic.  Right Ear: External ear normal.  Left Ear: External ear normal.  Eyes: Conjunctivae and EOM are normal. Pupils are equal, round, and reactive to light.  Neck: Normal range of motion and phonation normal. Neck supple.  Cardiovascular: Normal rate, regular rhythm and normal heart sounds.   Pulmonary/Chest: Effort normal and breath sounds normal. He exhibits no bony tenderness.  Abdominal: Soft. There is no tenderness.  Musculoskeletal: Normal range of motion.  Neurological: He is alert  and oriented to person, place, and time. No cranial nerve deficit or sensory deficit. He exhibits normal muscle tone. Coordination normal.  Skin: Skin is warm, dry and intact.  Psychiatric: He has a normal mood and affect. His behavior is normal. Judgment and thought content normal.  Nursing note and vitals reviewed.    ED Treatments / Results  Labs (all labs ordered are listed, but only abnormal results are displayed) Labs Reviewed  BASIC METABOLIC PANEL  CBC  URINALYSIS, ROUTINE W REFLEX MICROSCOPIC  RAPID URINE DRUG SCREEN, HOSP PERFORMED  ETHANOL  I-STAT TROPOININ, ED    EKG  EKG Interpretation  Date/Time:  Tuesday June 21 2016 14:45:03  EDT Ventricular Rate:  79 PR Interval:    QRS Duration: 107 QT Interval:  382 QTC Calculation: 438 R Axis:   -23 Text Interpretation:  Sinus rhythm Incomplete RBBB and LAFB ST elevation suggests acute pericarditis Confirmed by Effie Shy  MD, Saagar Tortorella ([id number]) on 06/21/2016 3:44:28 PM       Radiology No results found.  Procedures Procedures (including critical care time)  Medications Ordered in ED Medications  sodium chloride 0.9 % bolus 500 mL (not administered)  0.9 %  sodium chloride infusion (not administered)     Initial Impression / Assessment and Plan / ED Course  I have reviewed the triage vital signs and the nursing notes.  Pertinent labs & imaging results that were available during my care of the patient were reviewed by me and considered in my medical decision making (see chart for details).    Medications  sodium chloride 0.9 % bolus 500 mL (not administered)  0.9 %  sodium chloride infusion (not administered)    Patient Vitals for the past 24 hrs:  BP Temp Temp src Pulse Resp SpO2  06/21/16 1430 135/74 98.2 F (36.8 C) Oral 79 18 93 %       Final Clinical Impressions(s) / ED Diagnoses   Final diagnoses:  Alcoholic intoxication with complication (HCC)  Facial numbness    Alcoholism, with desire for detox.  Nonspecific facial numbness, without other associated central neurologic abnormalities.  Patient appears clinically intoxicated.  Hemodynamically stable, on initial evaluation.  Head CT and screening labs ordered.  Nursing Notes Reviewed/ Care Coordinated Applicable Imaging Reviewed Interpretation of Laboratory Data incorporated into ED treatment  Plan-care to Dr. Lynelle Doctor to evaluate after return of labs and imaging.  New Prescriptions New Prescriptions   No medications on file     Mancel Bale, MD 06/22/16 [phone]

## 2016-06-21 NOTE — ED Notes (Signed)
Pt ambulatory and independent at discharge.  Verbalized understanding of discharge instructions 

## 2016-06-21 NOTE — ED Notes (Signed)
Bed: [facility] Expected date:  Expected time:  Means of arrival:  Comments: 

## 2016-06-21 NOTE — ED Triage Notes (Signed)
Pt c/o indigestion and nausea. EKG ordered

## 2016-06-21 NOTE — Discharge Instructions (Signed)
Please follow up with her primary care doctor to make sure the facial numbness resolves.Take the vitamin supplements as prescribed. Follow-up with the outpatient resources to help with your alcohol use.

## 2016-08-06 ENCOUNTER — Emergency Department (HOSPITAL_COMMUNITY): Payer: Self-pay

## 2016-08-06 ENCOUNTER — Emergency Department (HOSPITAL_COMMUNITY)
Admission: EM | Admit: 2016-08-06 | Discharge: 2016-08-07 | Disposition: A | Discharge status: Home / Self Care | Payer: Self-pay | Attending: Emergency Medicine | Admitting: Emergency Medicine

## 2016-08-06 ENCOUNTER — Encounter (HOSPITAL_COMMUNITY): Payer: Self-pay | Admitting: Emergency Medicine

## 2016-08-06 DIAGNOSIS — R079 Chest pain, unspecified: Secondary | ICD-10-CM | POA: Insufficient documentation

## 2016-08-06 DIAGNOSIS — I10 Essential (primary) hypertension: Secondary | ICD-10-CM | POA: Insufficient documentation

## 2016-08-06 DIAGNOSIS — F17210 Nicotine dependence, cigarettes, uncomplicated: Secondary | ICD-10-CM | POA: Insufficient documentation

## 2016-08-06 DIAGNOSIS — F1010 Alcohol abuse, uncomplicated: Secondary | ICD-10-CM | POA: Insufficient documentation

## 2016-08-06 DIAGNOSIS — R45851 Suicidal ideations: Secondary | ICD-10-CM | POA: Insufficient documentation

## 2016-08-06 LAB — CBC
HCT: 49.4 % (ref 39.0–52.0)
Hemoglobin: 18.2 g/dL — ABNORMAL HIGH (ref 13.0–17.0)
MCH: 33.5 pg (ref 26.0–34.0)
MCHC: 36.8 g/dL — ABNORMAL HIGH (ref 30.0–36.0)
MCV: 91 fL (ref 78.0–100.0)
Platelets: 227 10*3/uL (ref 150–400)
RBC: 5.43 MIL/uL (ref 4.22–5.81)
RDW: 12.8 % (ref 11.5–15.5)
WBC: 12.9 10*3/uL — ABNORMAL HIGH (ref 4.0–10.5)

## 2016-08-06 LAB — I-STAT TROPONIN, ED: Troponin i, poc: 0.01 ng/mL (ref 0.00–0.08)

## 2016-08-06 LAB — RAPID URINE DRUG SCREEN, HOSP PERFORMED
Amphetamines: NOT DETECTED
Barbiturates: NOT DETECTED
Benzodiazepines: NOT DETECTED
Cocaine: NOT DETECTED
Opiates: NOT DETECTED
Tetrahydrocannabinol: NOT DETECTED

## 2016-08-06 MED ORDER — THIAMINE HCL 100 MG/ML IJ SOLN
Freq: Once | INTRAVENOUS | Status: AC
  Administered 2016-08-06: 23:00:00 via INTRAVENOUS
  Filled 2016-08-06: qty 1000

## 2016-08-06 NOTE — Progress Notes (Signed)
TTS consult ordered. [facility] Pt unable to be assessed due to confidentiality. Pt will need to be privately roomed prior to TTS assessment. TTS is unable to assess this pt in the hallway. RN aware.  Zachary [name], MSW, LCSWA TTS Specialist [phone]

## 2016-08-06 NOTE — ED Triage Notes (Signed)
Patient arrived with EMS from home with alcohol intoxication stated he is requesting detox. Per EMS patient stated he was been drinking white wine since last night.

## 2016-08-06 NOTE — ED Provider Notes (Signed)
[facility]-EMERGENCY DEPT Provider Note   CSN: [id number] Arrival date & time: 08/06/16  2018     History   Chief Complaint No chief complaint on file.   HPI Zachary [name] is a 56 y.o. male with history of hypertension, alcohol abuse, chest pain who presents with alcohol intoxication. Patient states he has been in his house drinking for the past 3-4 days. He has not eaten anything. He reports he has not drank much water as makes him gag. Patient has had associated "throbbing in his eyes" and headache, which he states he has at baseline. Patient has had upper abdominal pain as well. Patient also reports that he began having chest pain when he was walking to come to the hospital today. He states he has a history of chest pain, however no documented heart problems. Patient states this chest pain was associated with shortness of breath. His chest pain is only mild at this time. Patient requests help with detox. He states that he can't go on anymore and endorses suicidal ideation without plan. Patient states he has been drinking to cope with stress. He has history of anxiety and has felt very anxious lately due to stress at work. Patient states he was clean from alcohol for about a year and started drinking again recently.  HPI  Past Medical History:  Diagnosis Date  . Alcohol abuse   . Hypertension     Patient Active Problem List   Diagnosis Date Noted  . Chest pain 10/28/2015  . Hypertension 10/28/2015  . Alcohol abuse 10/28/2015  . Hyperglycemia 10/28/2015  . Alcohol withdrawal (HCC) 10/28/2015  . Tobacco abuse 10/28/2015  . Chest pain at rest 10/28/2015  . HTN (hypertension), benign     Past Surgical History:  Procedure Laterality Date  . COLONOSCOPY    . MOUTH SURGERY    . NO PAST SURGERIES         Home Medications    Prior to Admission medications   Medication Sig Start Date End Date Taking? Authorizing Provider  aspirin-sod bicarb-citric acid (ALKA-SELTZER) 325 MG  TBEF tablet Take 325 mg by mouth every 6 (six) hours as needed (for heartburn/indigestion).    Yes [provider]  ibuprofen (ADVIL,MOTRIN) 200 MG tablet Take 800 mg by mouth every 6 (six) hours as needed for fever, headache, mild pain or moderate pain.    Yes [provider]  chlordiazePOXIDE (LIBRIUM) 25 MG capsule 50mg  PO TID x 1D, then 25-50mg  PO BID X 1D, then 25-50mg  PO QD X 1D Patient not taking: Reported on 08/06/2016 06/21/16   Linwood DibblesKnapp, Jon, MD  Multiple Vitamin (MULTIVITAMIN) capsule Take 1 capsule by mouth daily. Patient not taking: Reported on 08/06/2016 06/21/16   Linwood DibblesKnapp, Jon, MD  thiamine (VITAMIN B-1) 100 MG tablet Take 1 tablet (100 mg total) by mouth daily. Patient not taking: Reported on 08/06/2016 06/21/16   Linwood DibblesKnapp, Jon, MD    Family History Family History  Problem Relation Age of Onset  . Heart attack Paternal Grandfather   . Stroke Mother   . Heart failure Mother   . Heart failure Father     Social History Social History  Substance Use Topics  . Smoking status: Current Some Day Smoker    Packs/day: 2.00    Types: Cigarettes  . Smokeless tobacco: Never Used  . Alcohol use Yes     Comment: daily     Allergies   Penicillins and Statins   Review of Systems Review of Systems  Constitutional:  Negative for chills and fever.  HENT: Negative for facial swelling and sore throat.   Eyes: Positive for visual disturbance.  Respiratory: Negative for shortness of breath.   Cardiovascular: Positive for chest pain.  Gastrointestinal: Positive for abdominal pain and nausea. Negative for vomiting.  Genitourinary: Negative for dysuria.  Musculoskeletal: Positive for back pain (baseline).  Skin: Negative for rash and wound.  Neurological: Positive for headaches.  Psychiatric/Behavioral: Positive for suicidal ideas. The patient is nervous/anxious.      Physical Exam Updated Vital Signs BP (!) 154/88 (BP Location: Left Arm)   Pulse 95   Resp 20   SpO2  95%   Physical Exam  Constitutional: He appears well-developed and well-nourished. No distress.  HENT:  Head: Normocephalic and atraumatic.  Mouth/Throat: Oropharynx is clear and moist. No oropharyngeal exudate.  Eyes: Conjunctivae and EOM are normal. Pupils are equal, round, and reactive to light. Right eye exhibits no discharge. Left eye exhibits no discharge. No scleral icterus.  Neck: Normal range of motion. Neck supple. No thyromegaly present.  Cardiovascular: Normal rate, regular rhythm, normal heart sounds and intact distal pulses.  Exam reveals no gallop and no friction rub.   No murmur heard. Pulmonary/Chest: Effort normal and breath sounds normal. No stridor. No respiratory distress. He has no wheezes. He has no rales.  Abdominal: Soft. Bowel sounds are normal. He exhibits no distension. There is tenderness in the right upper quadrant, epigastric area and left upper quadrant. There is no rebound, no guarding and no tenderness at McBurney's point.  Musculoskeletal: He exhibits no edema.  Lymphadenopathy:    He has no cervical adenopathy.  Neurological: He is alert. Coordination normal.  CN 3-12 intact; normal sensation throughout; 5/5 strength in all 4 extremities; equal bilateral grip strength  Skin: Skin is warm and dry. No rash noted. He is not diaphoretic. No pallor.  Psychiatric: He has a normal mood and affect. He is not actively hallucinating. He expresses suicidal ideation. He expresses no homicidal ideation. He expresses no suicidal plans and no homicidal plans.  Nursing note and vitals reviewed.    ED Treatments / Results  Labs (all labs ordered are listed, but only abnormal results are displayed) Labs Reviewed  COMPREHENSIVE METABOLIC PANEL - Abnormal; Notable for the following:       Result Value   CO2 20 (*)    Calcium 8.7 (*)    All other components within normal limits  ETHANOL - Abnormal; Notable for the following:    Alcohol, Ethyl (B) 165 (*)    All other  components within normal limits  ACETAMINOPHEN LEVEL - Abnormal; Notable for the following:    Acetaminophen (Tylenol), Serum <10 (*)    All other components within normal limits  CBC - Abnormal; Notable for the following:    WBC 12.9 (*)    Hemoglobin 18.2 (*)    MCHC 36.8 (*)    All other components within normal limits  SALICYLATE LEVEL  RAPID URINE DRUG SCREEN, HOSP PERFORMED  LIPASE, BLOOD  I-STAT TROPOININ, ED  I-STAT TROPOININ, ED    EKG  EKG Interpretation None       Radiology Dg Chest 2 View  Result Date: 08/06/2016 CLINICAL DATA:  Acute onset of generalized chest pain. Initial encounter. EXAM: CHEST  2 VIEW COMPARISON:  Chest radiograph performed 10/28/2015 FINDINGS: The lungs are well-aerated and clear. There is no evidence of focal opacification, pleural effusion or pneumothorax. The heart is normal in size; the mediastinal contour is within normal  limits. No acute osseous abnormalities are seen. IMPRESSION: No acute cardiopulmonary process seen. Electronically Signed   By: Roanna Raider M.D.   On: 08/06/2016 22:51   US Abdomen Limited  Result Date: 08/07/2016 CLINICAL DATA:  Right upper quadrant and epigastric pain. Hypertension. EXAM: US ABDOMEN LIMITED - RIGHT UPPER QUADRANT COMPARISON:  None. FINDINGS: Gallbladder: Small amount of sludge layering in the gallbladder. No discrete stones. No gallbladder wall thickening or edema. Common bile duct: Diameter: 4.5 mm, normal Liver: Diffusely increased hepatic parenchymal echotexture suggesting fatty infiltration. No focal lesions are identified. IMPRESSION: Small amount of sludge in the gallbladder. No evidence of cholelithiasis or acute cholecystitis. Diffuse fatty infiltration of the liver. Electronically Signed   By: Burman Nieves M.D.   On: 08/07/2016 03:11    Procedures Procedures (including critical care time)  Medications Ordered in ED Medications  LORazepam (ATIVAN) injection 0-4 mg (2 mg Intravenous Given  08/07/16 0303)    Or  LORazepam (ATIVAN) tablet 0-4 mg ( Oral See Alternative 08/07/16 0303)  LORazepam (ATIVAN) injection 0-4 mg (not administered)    Or  LORazepam (ATIVAN) tablet 0-4 mg (not administered)  thiamine (VITAMIN B-1) tablet 100 mg (not administered)    Or  thiamine (B-1) injection 100 mg (not administered)  sodium chloride 0.9 % 1,000 mL with thiamine 100 mg, folic acid 1 mg, multivitamins adult 10 mL infusion ( Intravenous Stopped 08/07/16 0253)  sodium chloride 0.9 % bolus 1,000 mL (0 mLs Intravenous Stopped 08/07/16 0253)  ondansetron (ZOFRAN) injection 4 mg (4 mg Intravenous Given 08/07/16 0102)  sodium chloride 0.9 % bolus 1,000 mL (0 mLs Intravenous Stopped 08/07/16 0544)     Initial Impression / Assessment and Plan / ED Course  I have reviewed the triage vital signs and the nursing notes.  Pertinent labs & imaging results that were available during my care of the patient were reviewed by me and considered in my medical decision making (see chart for details).     CBC shows WBC 12.9. CMP is unremarkable. Lipase 29. Delta troponin is negative. Ethanol level 165. Patient put on CIWA protocol, showing tremors a diaphoresis. Ativan was given per CIWA. Patient given banana bag and 2 L normal saline bolus illness. Patient feeling much better. Patient without chest pain. Right upper quadrant ultrasound shows small amount of sludge in the gallbladder, but no evidence of cholelithiasis or acute cholecystitis; diffuse fatty infiltration of the liver. CXR shows no active cardiopulmonary disease. Patient with alcohol intoxication with suicidal ideation. He states he wants help and cannot go on anymore. TTS was consulted and patient meets criteria for inpatient treatment. Patient is medically cleared. I discussed patient case with Dr. Rhunette Croft who guided the patient's management and agrees with plan.   Final Clinical Impressions(s) / ED Diagnoses   Final diagnoses:  Alcohol abuse    Suicidal ideation    New Prescriptions New Prescriptions   No medications on file     Verdis Prime 08/07/16 0630    Derwood Kaplan, MD 08/09/16 2243

## 2016-08-06 NOTE — ED Notes (Signed)
TTS Consult In progress

## 2016-08-06 NOTE — ED Notes (Signed)
[facility] Expected date:  Expected time:  Means of arrival:  Comments: 1252m ETOH

## 2016-08-07 ENCOUNTER — Emergency Department (HOSPITAL_COMMUNITY): Payer: Self-pay

## 2016-08-07 LAB — COMPREHENSIVE METABOLIC PANEL
ALT: 38 U/L (ref 17–63)
AST: 32 U/L (ref 15–41)
Albumin: 4.1 g/dL (ref 3.5–5.0)
Alkaline Phosphatase: 111 U/L (ref 38–126)
Anion gap: 15 (ref 5–15)
BUN: 13 mg/dL (ref 6–20)
CO2: 20 mmol/L — ABNORMAL LOW (ref 22–32)
Calcium: 8.7 mg/dL — ABNORMAL LOW (ref 8.9–10.3)
Chloride: 102 mmol/L (ref 101–111)
Creatinine, Ser: 0.85 mg/dL (ref 0.61–1.24)
GFR calc Af Amer: >60 mL/min (ref >60)
GFR calc non Af Amer: >60 mL/min (ref >60)
Glucose, Bld: 90 mg/dL (ref 65–99)
Potassium: 4.1 mmol/L (ref 3.5–5.1)
Sodium: 137 mmol/L (ref 135–145)
Total Bilirubin: 0.7 mg/dL (ref 0.3–1.2)
Total Protein: 7.1 g/dL (ref 6.5–8.1)

## 2016-08-07 LAB — I-STAT TROPONIN, ED: Troponin i, poc: 0.01 ng/mL (ref 0.00–0.08)

## 2016-08-07 LAB — ACETAMINOPHEN LEVEL: Acetaminophen (Tylenol), Serum: <10 ug/mL — ABNORMAL LOW (ref 10–30)

## 2016-08-07 LAB — LIPASE, BLOOD: Lipase: 29 U/L (ref 11–51)

## 2016-08-07 LAB — ETHANOL: Alcohol, Ethyl (B): 165 mg/dL — ABNORMAL HIGH (ref <5)

## 2016-08-07 LAB — SALICYLATE LEVEL: Salicylate Lvl: <7 mg/dL (ref 2.8–30.0)

## 2016-08-07 MED ORDER — ONDANSETRON HCL 4 MG/2ML IJ SOLN
4.0000 mg | Freq: Once | INTRAMUSCULAR | Status: AC
  Administered 2016-08-07: 4 mg via INTRAVENOUS
  Filled 2016-08-07: qty 2

## 2016-08-07 MED ORDER — CHLORDIAZEPOXIDE HCL 25 MG PO CAPS: ORAL_CAPSULE | ORAL | 0 refills | Status: DC

## 2016-08-07 MED ORDER — LORAZEPAM 1 MG PO TABS: 0.0000 mg | ORAL_TABLET | Freq: Two times a day (BID) | ORAL | Status: DC

## 2016-08-07 MED ORDER — LORAZEPAM 2 MG/ML IJ SOLN
0.0000 mg | Freq: Four times a day (QID) | INTRAMUSCULAR | Status: DC
  Administered 2016-08-07 (×2): 2 mg via INTRAVENOUS
  Filled 2016-08-07 (×2): qty 1

## 2016-08-07 MED ORDER — VITAMIN B-1 100 MG PO TABS: 100.0000 mg | ORAL_TABLET | Freq: Every day | ORAL | Status: DC

## 2016-08-07 MED ORDER — SODIUM CHLORIDE 0.9 % IV BOLUS (SEPSIS)
1000.0000 mL | Freq: Once | INTRAVENOUS | Status: AC
  Administered 2016-08-07: 1000 mL via INTRAVENOUS

## 2016-08-07 MED ORDER — THIAMINE HCL 100 MG/ML IJ SOLN
100.0000 mg | Freq: Every day | INTRAMUSCULAR | Status: DC
  Administered 2016-08-07: 100 mg via INTRAVENOUS
  Filled 2016-08-07: qty 2

## 2016-08-07 MED ORDER — LORAZEPAM 2 MG/ML IJ SOLN: 0.0000 mg | Freq: Two times a day (BID) | INTRAMUSCULAR | Status: DC

## 2016-08-07 MED ORDER — LORAZEPAM 1 MG PO TABS
0.0000 mg | ORAL_TABLET | Freq: Four times a day (QID) | ORAL | Status: DC
Start: 2016-08-07 — End: 2016-08-07

## 2016-08-07 NOTE — Discharge Instructions (Signed)
Please return to the Emergency Department if you develop any symptoms associated with alcohol withdrawal including seizures, heart racing, severe tremor, or hallucinations. Please take the Librium as prescribed. Please call [facility] to get established with a primary care provider.

## 2016-08-07 NOTE — ED Notes (Signed)
Patient ambulatory to restroom and back without staff assistance.

## 2016-08-07 NOTE — BH Assessment (Addendum)
Tele Assessment Note   Zachary [name] is an 56 y.o. male who presents to the ED voluntarily. Pt reports he has been binge drinking for the past 4 days due to increased stressors. When pt was asked what is triggering him, he stated "just family stuff, stuff with my fiance', stuff with the kids." Pt appears anxious and disheveled during the assessment and gagged multiple times as if he was about to vomit. Pt stated he has struggled with alcohol for many years and he feels "tired of life." Pt states he often thinks of suicide but denies a specific plan.   Pt reports he lives at home alone with a dog that he is also worried about due to him being hospitalized. Pt states he has someone that can go to his home to check on his dog in the event he remains in the ED. Pt reports a hx of inpt treatment due to substance abuse and chart shows multiple ED visits due to alcohol intoxication. Pt denies a current provider for OPT. When pt was asked about AVH, the pt stated "the other day I was hearing noises in my home, like banging around and the dog was outside."   Per Nira Conn, NP pt meets inpt criteria. TTS to seek placement. [place], [facility] Caryn Section, RN aware of disposition.  Diagnosis: Alcohol Use D/O; MDD, single episode   Past Medical History:  Past Medical History:  Diagnosis Date  . Alcohol abuse   . Hypertension     Past Surgical History:  Procedure Laterality Date  . COLONOSCOPY    . MOUTH SURGERY    . NO PAST SURGERIES      Family History:  Family History  Problem Relation Age of Onset  . Heart attack Paternal Grandfather   . Stroke Mother   . Heart failure Mother   . Heart failure Father     Social History:  reports that he has been smoking Cigarettes.  He has been smoking about 2.00 packs per day. He has never used smokeless tobacco. He reports that he drinks alcohol. He reports that he uses drugs, including Marijuana.  Additional Social History:  Alcohol / Drug Use Pain  Medications: See PTA meds  Prescriptions: See PTA meds  Over the Counter: See PTA meds  History of alcohol / drug use?: Yes Substance #1 Name of Substance 1: Alcohol 1 - Age of First Use: 25 1 - Amount (size/oz): 4 bottles of wine 1 - Frequency: pt reports he has been binging for the past several days  1 - Duration: ongoing 1 - Last Use / Amount: 08/06/16  CIWA: CIWA-Ar BP: (!) 150/94 Pulse Rate: 96 Nausea and Vomiting: no nausea and no vomiting Tactile Disturbances: none Tremor: not visible, but can be felt fingertip to fingertip Auditory Disturbances: not present Paroxysmal Sweats: barely perceptible sweating, palms moist Visual Disturbances: not present Anxiety: no anxiety, at ease Headache, Fullness in Head: none present Agitation: normal activity Orientation and Clouding of Sensorium: oriented and can do serial additions CIWA-Ar Total: 2 COWS:    PATIENT STRENGTHS: (choose at least two) Communication skills Motivation for treatment/growth  Allergies:  Allergies  Allergen Reactions  . Penicillins Other (See Comments)    Reaction:  Unknown  Has patient had a PCN reaction causing immediate rash, facial/tongue/throat swelling, SOB or lightheadedness with hypotension: Unsure Has patient had a PCN reaction causing severe rash involving mucus membranes or skin necrosis: Unsure Has patient had a PCN reaction that required hospitalization Unsure Has  patient had a PCN reaction occurring within the last 10 years: No If all of the above answers are "NO", then may proceed with Cephalosporin use.  . Statins Palpitations    Home Medications:  (Not in a hospital admission)  OB/GYN Status:  No LMP for male patient.  General Assessment Data Location of Assessment: [facility] ED TTS Assessment: In system Is this a Tele or Face-to-Face Assessment?: Face-to-Face Is this an Initial Assessment or a Re-assessment for this encounter?: Initial Assessment Marital status: Divorced Is patient  pregnant?: No Pregnancy Status: No Living Arrangements: Alone Can pt return to current living arrangement?: Yes Admission Status: Voluntary Is patient capable of signing voluntary admission?: Yes Referral Source: Self/Family/Friend Insurance type: none     Crisis Care Plan Living Arrangements: Alone Name of Psychiatrist: none Name of Therapist: none  Education Status Is patient currently in school?: No Highest grade of school patient has completed: some college   Risk to self with the past 6 months Suicidal Ideation: Yes-Currently Present Has patient been a risk to self within the past 6 months prior to admission? : Yes Suicidal Intent: No-Not Currently/Within Last 6 Months Has patient had any suicidal intent within the past 6 months prior to admission? : No Is patient at risk for suicide?: Yes Suicidal Plan?: No-Not Currently/Within Last 6 Months Has patient had any suicidal plan within the past 6 months prior to admission? : No Access to Means: No What has been your use of drugs/alcohol within the last 12 months?: reports to binging on alcohol for the past 4 days  Previous Attempts/Gestures: Yes How many times?: 1 Triggers for Past Attempts: Unpredictable Intentional Self Injurious Behavior: None Family Suicide History: Unknown (pt reports he found brother deceased, unsure if intentional) Recent stressful life event(s): Other (Comment) (pt stated "just family stuff") Persecutory voices/beliefs?: No Depression: Yes Depression Symptoms: Despondent, Insomnia, Tearfulness, Isolating, Fatigue, Guilt, Loss of interest in usual pleasures, Feeling worthless/self pity, Feeling angry/irritable Substance abuse history and/or treatment for substance abuse?: Yes Suicide prevention information given to non-admitted patients: Not applicable  Risk to Others within the past 6 months Homicidal Ideation: No Does patient have any lifetime risk of violence toward others beyond the six months  prior to admission? : No Thoughts of Harm to Others: No Current Homicidal Intent: No Current Homicidal Plan: No Access to Homicidal Means: No History of harm to others?: No Assessment of Violence: None Noted Does patient have access to weapons?: No Criminal Charges Pending?: No Does patient have a court date: No Is patient on probation?: No  Psychosis Hallucinations: Auditory Delusions: None noted  Mental Status Report Appearance/Hygiene: Disheveled Eye Contact: Poor Motor Activity: Unsteady Speech: Slurred Level of Consciousness: Alert Mood: Anxious, Depressed, Helpless Affect: Depressed Anxiety Level: Moderate Thought Processes: Relevant, Coherent Judgement: Impaired Orientation: Person, Place, Time, Appropriate for developmental age, Situation Obsessive Compulsive Thoughts/Behaviors: None  Cognitive Functioning Concentration: Normal Memory: Recent Intact, Remote Intact IQ: Average Insight: Poor Impulse Control: Poor Appetite: Poor Sleep: Decreased Total Hours of Sleep: 1 Vegetative Symptoms: None  ADLScreening [facility] Assessment Services) Patient's cognitive ability adequate to safely complete daily activities?: Yes Patient able to express need for assistance with ADLs?: Yes Independently performs ADLs?: Yes (appropriate for developmental age)  Prior Inpatient Therapy Prior Inpatient Therapy: Yes Prior Therapy Dates: 2008 Prior Therapy Facilty/Provider(s): [facility] Reason for Treatment: SA  Prior Outpatient Therapy Prior Outpatient Therapy: No Does patient have an ACCT team?: No Does patient have Intensive In-House Services?  : No Does patient  have [facility] services? : No Does patient have P4CC services?: No  ADL Screening (condition at time of admission) Patient's cognitive ability adequate to safely complete daily activities?: Yes Is the patient deaf or have difficulty hearing?: No Does the patient have difficulty seeing, even when wearing  glasses/contacts?: No Does the patient have difficulty concentrating, remembering, or making decisions?: No Patient able to express need for assistance with ADLs?: Yes Does the patient have difficulty dressing or bathing?: No Independently performs ADLs?: Yes (appropriate for developmental age) Does the patient have difficulty walking or climbing stairs?: No Weakness of Legs: None Weakness of Arms/Hands: None  Home Assistive Devices/Equipment Home Assistive Devices/Equipment: None    Abuse/Neglect Assessment (Assessment to be complete while patient is alone) Physical Abuse: Denies Verbal Abuse: Denies Sexual Abuse: Denies Exploitation of patient/patient's resources: Denies Self-Neglect: Denies     Merchant navy officerAdvance Directives (For Healthcare) Does Patient Have a Medical Advance Directive?: No Would patient like information on creating a medical advance directive?: No - Patient declined    Additional Information 1:1 In Past 12 Months?: No CIRT Risk: No Elopement Risk: No Does patient have medical clearance?:  (pending)     Disposition:  Disposition Initial Assessment Completed for this Encounter: Yes Disposition of Patient: Inpatient treatment program Type of inpatient treatment program: Adult (per Nira ConnJason Berry, NP)  Karolee OhsAquicha R Marijah Larranaga 08/07/2016 12:32 AM

## 2016-10-20 ENCOUNTER — Encounter (HOSPITAL_COMMUNITY): Payer: Self-pay | Admitting: Emergency Medicine

## 2016-10-20 ENCOUNTER — Emergency Department (HOSPITAL_COMMUNITY)
Admission: EM | Admit: 2016-10-20 | Discharge: 2016-10-20 | Disposition: A | Discharge status: Home / Self Care | Payer: Self-pay | Attending: Emergency Medicine | Admitting: Emergency Medicine

## 2016-10-20 DIAGNOSIS — K529 Noninfective gastroenteritis and colitis, unspecified: Secondary | ICD-10-CM | POA: Insufficient documentation

## 2016-10-20 DIAGNOSIS — F419 Anxiety disorder, unspecified: Secondary | ICD-10-CM | POA: Insufficient documentation

## 2016-10-20 DIAGNOSIS — F17210 Nicotine dependence, cigarettes, uncomplicated: Secondary | ICD-10-CM | POA: Insufficient documentation

## 2016-10-20 DIAGNOSIS — I10 Essential (primary) hypertension: Secondary | ICD-10-CM | POA: Insufficient documentation

## 2016-10-20 LAB — COMPREHENSIVE METABOLIC PANEL
ALBUMIN: 4.6 g/dL (ref 3.5–5.0)
ALK PHOS: 104 U/L (ref 38–126)
ALT: 47 U/L (ref 17–63)
AST: 47 U/L — ABNORMAL HIGH (ref 15–41)
Anion gap: 14 (ref 5–15)
BILIRUBIN TOTAL: 1 mg/dL (ref 0.3–1.2)
BUN: 11 mg/dL (ref 6–20)
CALCIUM: 9.2 mg/dL (ref 8.9–10.3)
CO2: 21 mmol/L — AB (ref 22–32)
Chloride: 100 mmol/L — ABNORMAL LOW (ref 101–111)
Creatinine, Ser: 0.98 mg/dL (ref 0.61–1.24)
GFR calc Af Amer: >60 mL/min (ref >60)
GFR calc non Af Amer: >60 mL/min (ref >60)
GLUCOSE: 130 mg/dL — AB (ref 65–99)
Potassium: 4.8 mmol/L (ref 3.5–5.1)
SODIUM: 135 mmol/L (ref 135–145)
TOTAL PROTEIN: 7.7 g/dL (ref 6.5–8.1)

## 2016-10-20 LAB — URINALYSIS, ROUTINE W REFLEX MICROSCOPIC
BACTERIA UA: NONE SEEN
Bilirubin Urine: NEGATIVE
Glucose, UA: NEGATIVE mg/dL
Hgb urine dipstick: NEGATIVE
KETONES UR: NEGATIVE mg/dL
Leukocytes, UA: NEGATIVE
NITRITE: NEGATIVE
PROTEIN: 30 mg/dL — AB
Specific Gravity, Urine: 1.013 (ref 1.005–1.030)
pH: 5 (ref 5.0–8.0)

## 2016-10-20 LAB — CBC
HEMATOCRIT: 50.5 % (ref 39.0–52.0)
Hemoglobin: 19 g/dL — ABNORMAL HIGH (ref 13.0–17.0)
MCH: 34 pg (ref 26.0–34.0)
MCHC: 37.6 g/dL — ABNORMAL HIGH (ref 30.0–36.0)
MCV: 90.3 fL (ref 78.0–100.0)
Platelets: 272 10*3/uL (ref 150–400)
RBC: 5.59 MIL/uL (ref 4.22–5.81)
RDW: 13.2 % (ref 11.5–15.5)
WBC: 11.7 10*3/uL — ABNORMAL HIGH (ref 4.0–10.5)

## 2016-10-20 LAB — LIPASE, BLOOD: Lipase: 31 U/L (ref 11–51)

## 2016-10-20 LAB — TROPONIN I

## 2016-10-20 LAB — ETHANOL: Alcohol, Ethyl (B): 114 mg/dL — ABNORMAL HIGH (ref <5)

## 2016-10-20 MED ORDER — LORAZEPAM 1 MG PO TABS: 1.0000 mg | ORAL_TABLET | Freq: Three times a day (TID) | ORAL | 0 refills | Status: DC | PRN

## 2016-10-20 MED ORDER — SODIUM CHLORIDE 0.9 % IV BOLUS (SEPSIS)
1000.0000 mL | Freq: Once | INTRAVENOUS | Status: AC
  Administered 2016-10-20: 1000 mL via INTRAVENOUS

## 2016-10-20 MED ORDER — PROMETHAZINE HCL 25 MG PO TABS: 25.0000 mg | ORAL_TABLET | Freq: Three times a day (TID) | ORAL | 0 refills | Status: DC | PRN

## 2016-10-20 MED ORDER — ONDANSETRON HCL 4 MG/2ML IJ SOLN
4.0000 mg | Freq: Once | INTRAMUSCULAR | Status: AC
  Administered 2016-10-20: 4 mg via INTRAVENOUS
  Filled 2016-10-20: qty 2

## 2016-10-20 MED ORDER — GI COCKTAIL ~~LOC~~
30.0000 mL | Freq: Once | ORAL | Status: AC
  Administered 2016-10-20: 30 mL via ORAL
  Filled 2016-10-20: qty 30

## 2016-10-20 MED ORDER — ONDANSETRON 4 MG PO TBDP: 4.0000 mg | ORAL_TABLET | Freq: Once | ORAL | Status: DC | PRN

## 2016-10-20 NOTE — ED Notes (Signed)
Provider made aware of elevated BP

## 2016-10-20 NOTE — ED Triage Notes (Signed)
Patient complaining of abdominal pain x 4 days. Patient has not been able to hold anything down for 4 days even the alcohol he drinks everyday. Patient states his belly is distended. Patient brought in by EMS.

## 2016-10-20 NOTE — Discharge Instructions (Signed)
Ativan as prescribed as needed for anxiety.  Phenergan as prescribed as needed for nausea.  Clear liquid diet for the next 12 hours, then slowly advanced to normal.  Return to the emergency department if your symptoms significantly worsen or change.

## 2016-10-20 NOTE — ED Provider Notes (Signed)
[facility]-EMERGENCY DEPT Provider Note   CSN: [id number] Arrival date & time: 10/20/16  0259     History   Chief Complaint Chief Complaint  Patient presents with  . Abdominal Pain    HPI Zachary [name] is a 56 y.o. male.  Patient is a 56 year old male with past mental history of alcohol abuse, hypertension, hyperglycemia presenting with complaints of epigastric pain and burning. This is been ongoing for the past 4 days. He reports being unable to eat secondary to this discomfort, however he has been able to continue consuming beer. He denies any fevers or chills. He denies any shortness of breath.   The history is provided by the patient.  Abdominal Pain   This is a new problem. Episode onset: 4 days ago. The problem occurs constantly. The problem has been rapidly worsening. The pain is associated with eating and alcohol use. The pain is located in the epigastric region. The quality of the pain is burning. The pain is moderate. Pertinent negatives include fever, flatus and melena. Nothing aggravates the symptoms. Nothing relieves the symptoms.    Past Medical History:  Diagnosis Date  . Alcohol abuse   . Hypertension     Patient Active Problem List   Diagnosis Date Noted  . Chest pain 10/28/2015  . Hypertension 10/28/2015  . Alcohol abuse 10/28/2015  . Hyperglycemia 10/28/2015  . Alcohol withdrawal (HCC) 10/28/2015  . Tobacco abuse 10/28/2015  . Chest pain at rest 10/28/2015  . HTN (hypertension), benign     Past Surgical History:  Procedure Laterality Date  . COLONOSCOPY    . MOUTH SURGERY    . NO PAST SURGERIES         Home Medications    Prior to Admission medications   Medication Sig Start Date End Date Taking? Authorizing Provider  aspirin-sod bicarb-citric acid (ALKA-SELTZER) 325 MG TBEF tablet Take 325 mg by mouth every 6 (six) hours as needed (for heartburn/indigestion).     [provider]  chlordiazePOXIDE (LIBRIUM) 25 MG capsule 50mg  PO TID x  1D, then 25-50mg  PO BID X 1D, then 25-50mg  PO QD X 1D 08/07/16   McDonald, Mia A, PA-C  ibuprofen (ADVIL,MOTRIN) 200 MG tablet Take 800 mg by mouth every 6 (six) hours as needed for fever, headache, mild pain or moderate pain.     [provider]  Multiple Vitamin (MULTIVITAMIN) capsule Take 1 capsule by mouth daily. Patient not taking: Reported on 08/06/2016 06/21/16   Linwood DibblesKnapp, Jon, MD  thiamine (VITAMIN B-1) 100 MG tablet Take 1 tablet (100 mg total) by mouth daily. Patient not taking: Reported on 08/06/2016 06/21/16   Linwood DibblesKnapp, Jon, MD    Family History Family History  Problem Relation Age of Onset  . Heart attack Paternal Grandfather   . Stroke Mother   . Heart failure Mother   . Heart failure Father     Social History Social History  Substance Use Topics  . Smoking status: Current Some Day Smoker    Packs/day: 2.00    Types: Cigarettes  . Smokeless tobacco: Never Used  . Alcohol use Yes     Comment: daily     Allergies   Penicillins and Statins   Review of Systems Review of Systems  Constitutional: Negative for fever.  Gastrointestinal: Positive for abdominal pain. Negative for flatus and melena.  All other systems reviewed and are negative.    Physical Exam Updated Vital Signs BP (!) 158/94 (BP Location: Right Arm)   Pulse 90  Temp 98.5 F (36.9 C) (Oral)   Resp 18   Ht 5\' 11"  (1.803 m)   Wt 90.1 kg (198 lb 9.6 oz)   SpO2 94%   BMI 27.70 kg/m   Physical Exam  Constitutional: He is oriented to person, place, and time. He appears well-developed and well-nourished. No distress.  HENT:  Head: Normocephalic and atraumatic.  Mouth/Throat: Oropharynx is clear and moist.  Neck: Normal range of motion. Neck supple.  Cardiovascular: Normal rate and regular rhythm.  Exam reveals no friction rub.   No murmur heard. Pulmonary/Chest: Effort normal and breath sounds normal. No respiratory distress. He has no wheezes. He has no rales.  Abdominal: Soft. Bowel  sounds are normal. He exhibits no distension. There is tenderness. There is no rebound and no guarding.  There is tenderness to palpation in the epigastric region.  Musculoskeletal: Normal range of motion. He exhibits no edema.  Neurological: He is alert and oriented to person, place, and time. Coordination normal.  Skin: Skin is warm and dry. He is not diaphoretic.  Nursing note and vitals reviewed.    ED Treatments / Results  Labs (all labs ordered are listed, but only abnormal results are displayed) Labs Reviewed  LIPASE, BLOOD  COMPREHENSIVE METABOLIC PANEL  CBC  URINALYSIS, ROUTINE W REFLEX MICROSCOPIC  TROPONIN I    EKG  EKG Interpretation None       Radiology No results found.  Procedures Procedures (including critical care time)  Medications Ordered in ED Medications  ondansetron (ZOFRAN-ODT) disintegrating tablet 4 mg (not administered)  gi cocktail (Maalox,Lidocaine,Donnatal) (not administered)  sodium chloride 0.9 % bolus 1,000 mL (not administered)  ondansetron (ZOFRAN) injection 4 mg (not administered)     Initial Impression / Assessment and Plan / ED Course  I have reviewed the triage vital signs and the nursing notes.  Pertinent labs & imaging results that were available during my care of the patient were reviewed by me and considered in my medical decision making (see chart for details).  Patient presents here with nausea, diarrhea, and decreased by mouth intake with the exception of alcohol for the past several days. He reports being extremely stressed out about a job he is currently working on. His abdominal exam today is benign and laboratory studies are reassuring. He was given IV fluids and anti-emetics and is feeling better. I see no indication at this time for hospitalization or further imaging studies.  He has requested something to help with "his nerves". I have agreed to write a small quantity of Ativan which he can take as needed. He is to  follow-up with his primary doctor.  Final Clinical Impressions(s) / ED Diagnoses   Final diagnoses:  None    New Prescriptions New Prescriptions   No medications on file     Geoffery Lyons, MD 10/20/16 [phone]

## 2016-12-29 ENCOUNTER — Encounter (HOSPITAL_COMMUNITY): Payer: Self-pay

## 2016-12-29 ENCOUNTER — Emergency Department (HOSPITAL_COMMUNITY)
Admission: EM | Admit: 2016-12-29 | Discharge: 2016-12-30 | Disposition: A | Discharge status: Other Acute Inpatient Hospital | Payer: Self-pay | Attending: Emergency Medicine | Admitting: Emergency Medicine

## 2016-12-29 ENCOUNTER — Emergency Department (HOSPITAL_COMMUNITY): Payer: Self-pay

## 2016-12-29 DIAGNOSIS — F17210 Nicotine dependence, cigarettes, uncomplicated: Secondary | ICD-10-CM | POA: Insufficient documentation

## 2016-12-29 DIAGNOSIS — Y999 Unspecified external cause status: Secondary | ICD-10-CM | POA: Insufficient documentation

## 2016-12-29 DIAGNOSIS — I10 Essential (primary) hypertension: Secondary | ICD-10-CM | POA: Insufficient documentation

## 2016-12-29 DIAGNOSIS — F10230 Alcohol dependence with withdrawal, uncomplicated: Secondary | ICD-10-CM | POA: Insufficient documentation

## 2016-12-29 DIAGNOSIS — F1010 Alcohol abuse, uncomplicated: Secondary | ICD-10-CM

## 2016-12-29 DIAGNOSIS — M25551 Pain in right hip: Secondary | ICD-10-CM | POA: Insufficient documentation

## 2016-12-29 DIAGNOSIS — Y929 Unspecified place or not applicable: Secondary | ICD-10-CM | POA: Insufficient documentation

## 2016-12-29 DIAGNOSIS — Y9389 Activity, other specified: Secondary | ICD-10-CM | POA: Insufficient documentation

## 2016-12-29 DIAGNOSIS — R45851 Suicidal ideations: Secondary | ICD-10-CM | POA: Insufficient documentation

## 2016-12-29 DIAGNOSIS — F332 Major depressive disorder, recurrent severe without psychotic features: Secondary | ICD-10-CM | POA: Insufficient documentation

## 2016-12-29 DIAGNOSIS — F331 Major depressive disorder, recurrent, moderate: Secondary | ICD-10-CM | POA: Diagnosis present

## 2016-12-29 LAB — COMPREHENSIVE METABOLIC PANEL
ALBUMIN: 4.3 g/dL (ref 3.5–5.0)
ALT: 26 U/L (ref 17–63)
ANION GAP: 12 (ref 5–15)
AST: 19 U/L (ref 15–41)
Alkaline Phosphatase: 81 U/L (ref 38–126)
BILIRUBIN TOTAL: 0.6 mg/dL (ref 0.3–1.2)
BUN: 9 mg/dL (ref 6–20)
CHLORIDE: 100 mmol/L — AB (ref 101–111)
CO2: 20 mmol/L — ABNORMAL LOW (ref 22–32)
Calcium: 9 mg/dL (ref 8.9–10.3)
Creatinine, Ser: 0.77 mg/dL (ref 0.61–1.24)
GFR calc Af Amer: >60 mL/min (ref >60)
GFR calc non Af Amer: >60 mL/min (ref >60)
GLUCOSE: 98 mg/dL (ref 65–99)
POTASSIUM: 4 mmol/L (ref 3.5–5.1)
Sodium: 132 mmol/L — ABNORMAL LOW (ref 135–145)
TOTAL PROTEIN: 6.8 g/dL (ref 6.5–8.1)

## 2016-12-29 LAB — CBC
HCT: 41.9 % (ref 39.0–52.0)
Hemoglobin: 15.3 g/dL (ref 13.0–17.0)
MCH: 32.3 pg (ref 26.0–34.0)
MCHC: 36.5 g/dL — ABNORMAL HIGH (ref 30.0–36.0)
MCV: 88.4 fL (ref 78.0–100.0)
PLATELETS: 210 10*3/uL (ref 150–400)
RBC: 4.74 MIL/uL (ref 4.22–5.81)
RDW: 12.1 % (ref 11.5–15.5)
WBC: 11.3 10*3/uL — AB (ref 4.0–10.5)

## 2016-12-29 LAB — ACETAMINOPHEN LEVEL

## 2016-12-29 LAB — RAPID URINE DRUG SCREEN, HOSP PERFORMED
AMPHETAMINES: NOT DETECTED
BENZODIAZEPINES: NOT DETECTED
Barbiturates: NOT DETECTED
Cocaine: NOT DETECTED
OPIATES: NOT DETECTED
Tetrahydrocannabinol: NOT DETECTED

## 2016-12-29 LAB — ETHANOL: ALCOHOL ETHYL (B): 75 mg/dL — AB (ref <10)

## 2016-12-29 LAB — SALICYLATE LEVEL

## 2016-12-29 MED ORDER — LORAZEPAM 1 MG PO TABS
0.0000 mg | ORAL_TABLET | Freq: Four times a day (QID) | ORAL | Status: DC
  Administered 2016-12-29: 1 mg via ORAL
  Administered 2016-12-29 – 2016-12-30 (×2): 2 mg via ORAL
  Administered 2016-12-30: 1 mg via ORAL
  Filled 2016-12-29: qty 1
  Filled 2016-12-29 (×2): qty 2
  Filled 2016-12-29: qty 1

## 2016-12-29 MED ORDER — LORAZEPAM 1 MG PO TABS: 0.0000 mg | ORAL_TABLET | Freq: Two times a day (BID) | ORAL | Status: DC

## 2016-12-29 MED ORDER — ACETAMINOPHEN 325 MG PO TABS
650.0000 mg | ORAL_TABLET | ORAL | Status: DC | PRN
  Administered 2016-12-29: 650 mg via ORAL
  Filled 2016-12-29: qty 2

## 2016-12-29 MED ORDER — VITAMIN B-1 100 MG PO TABS
100.0000 mg | ORAL_TABLET | Freq: Every day | ORAL | Status: DC
  Administered 2016-12-29 – 2016-12-30 (×2): 100 mg via ORAL
  Filled 2016-12-29 (×2): qty 1

## 2016-12-29 MED ORDER — THIAMINE HCL 100 MG/ML IJ SOLN: 100.0000 mg | Freq: Every day | INTRAMUSCULAR | Status: DC

## 2016-12-29 MED ORDER — ACETAMINOPHEN 500 MG PO TABS
1000.0000 mg | ORAL_TABLET | Freq: Once | ORAL | Status: AC
  Administered 2016-12-29: 1000 mg via ORAL
  Filled 2016-12-29: qty 2

## 2016-12-29 MED ORDER — LORAZEPAM 2 MG/ML IJ SOLN: 0.0000 mg | Freq: Four times a day (QID) | INTRAMUSCULAR | Status: DC

## 2016-12-29 MED ORDER — LORAZEPAM 2 MG/ML IJ SOLN: 0.0000 mg | Freq: Two times a day (BID) | INTRAMUSCULAR | Status: DC

## 2016-12-29 MED ORDER — ONDANSETRON 4 MG PO TBDP
4.0000 mg | ORAL_TABLET | Freq: Once | ORAL | Status: AC
  Administered 2016-12-29: 4 mg via ORAL
  Filled 2016-12-29: qty 1

## 2016-12-29 MED ORDER — ONDANSETRON HCL 4 MG PO TABS
4.0000 mg | ORAL_TABLET | Freq: Three times a day (TID) | ORAL | Status: DC | PRN
  Filled 2016-12-29: qty 1

## 2016-12-29 NOTE — ED Notes (Signed)
Bed: [facility] Expected date:  Expected time:  Means of arrival:  Comments: EMS-SI 
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
## 2016-12-29 NOTE — Patient Outreach (Signed)
ED Peer Support Specialist Patient Intake (Complete at intake & 30-60 Day Follow-up)  Name: Zachary [name]  MRN: [id number]  Age: 56 y.o.   Date of Admission: 12/29/2016  Intake: Initial Comments:      Primary Reason Admitted: panic attacks, anxiety, alcohol use/withdraw, depression, suicidal ideations  Lab values: Alcohol/ETOH: Positive Positive UDS? No Amphetamines: No Barbiturates: No Benzodiazepines: No Cocaine: No Opiates: No Cannabinoids: No  Demographic information: Gender: Male Ethnicity:   Marital Status: Divorced Insurance Status: Uninsured/Self-pay Control and instrumentation engineereceives non-medical governmental assistance (Work Engineer, agriculturalirst/Welfare, Sales executivefood stamps, etc.: No Lives with: Alone Living situation: House/Apartment  Reported Patient History: Patient reported health conditions: Anxiety disorders, Depression (Panic disorder, Major depressive disorder) Patient aware of HIV and hepatitis status: Yes (comment) (Negative)  In past year, has patient visited ED for any reason? Yes  Number of ED visits: 8  Reason(s) for visit: alcohol intoxication, alcohol withdraws, back pain  In past year, has patient been hospitalized for any reason?  (2 3-[facility] detox [facility]  Number of hospitalizations:    Reason(s) for hospitalization:    In past year, has patient been arrested? No  Number of arrests:    Reason(s) for arrest:    In past year, has patient been incarcerated? No  Number of incarcerations:    Reason(s) for incarceration:    In past year, has patient received medication-assisted treatment? No  In past year, patient received the following treatments:  [place] [place]  In past year, has patient received any harm reduction services? No  Did this include any of the following?    In past year, has patient received care from a mental health provider for diagnosis other than SUD? No  In past year, is this first time patient has overdosed? No  Number of  past overdoses: 0  In past year, is this first time patient has been hospitalized for an overdose? No  Number of hospitalizations for overdose(s): 0  Is patient currently receiving treatment for a mental health diagnosis? No  Patient reports experiencing difficulty participating in SUD treatment: No    Most important reason(s) for this difficulty?    Has patient received prior services for treatment? Yes ([facility], [facility], [facility])  In past, patient has received services from following agencies:  ([facility] other [facility] at this [facility]. [facility] will follow up with the patient after he is transferred. )  Plan of Care:  Suggested follow up at these agencies/treatment centers:    Other information:   Zachary BoardsJohn Rasheed [name], C[facility]  12/29/2016 5:07 PM
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
## 2016-12-29 NOTE — ED Notes (Signed)
Patient transported to X-ray 

## 2016-12-29 NOTE — ED Notes (Signed)
Patient sleeping most of the shift. Patient stated "I just want to sleep in". Reports left knee pain of 6/10. Accepted prn of Acetaminophen for pain and bedtime med. Denies SI/HI, AH/VH at this time. Appear sad and mildly anxious. Cooperative on unit. No behavior issues noted. Appear to be sleeping at this time. Will continue to monitor patient.

## 2016-12-29 NOTE — ED Notes (Signed)
ED Provider at bedside. 
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
## 2016-12-30 ENCOUNTER — Encounter (HOSPITAL_COMMUNITY): Payer: Self-pay | Admitting: *Deleted

## 2016-12-30 ENCOUNTER — Inpatient Hospital Stay (HOSPITAL_COMMUNITY)
Admission: AD | Admit: 2016-12-30 | Discharge: 2017-01-04 | DRG: 885 | Disposition: A | Discharge status: Home / Self Care | Payer: Federal, State, Local not specified - Other | Source: Intra-hospital | Attending: Psychiatry | Admitting: Psychiatry

## 2016-12-30 DIAGNOSIS — F411 Generalized anxiety disorder: Secondary | ICD-10-CM | POA: Diagnosis present

## 2016-12-30 DIAGNOSIS — F10230 Alcohol dependence with withdrawal, uncomplicated: Secondary | ICD-10-CM | POA: Diagnosis not present

## 2016-12-30 DIAGNOSIS — F331 Major depressive disorder, recurrent, moderate: Secondary | ICD-10-CM | POA: Diagnosis present

## 2016-12-30 DIAGNOSIS — R45851 Suicidal ideations: Secondary | ICD-10-CM | POA: Diagnosis not present

## 2016-12-30 DIAGNOSIS — Z79899 Other long term (current) drug therapy: Secondary | ICD-10-CM

## 2016-12-30 DIAGNOSIS — I10 Essential (primary) hypertension: Secondary | ICD-10-CM | POA: Diagnosis present

## 2016-12-30 DIAGNOSIS — F17210 Nicotine dependence, cigarettes, uncomplicated: Secondary | ICD-10-CM

## 2016-12-30 DIAGNOSIS — F332 Major depressive disorder, recurrent severe without psychotic features: Secondary | ICD-10-CM | POA: Diagnosis present

## 2016-12-30 DIAGNOSIS — F410 Panic disorder [episodic paroxysmal anxiety] without agoraphobia: Secondary | ICD-10-CM | POA: Diagnosis present

## 2016-12-30 DIAGNOSIS — M545 Low back pain: Secondary | ICD-10-CM | POA: Diagnosis not present

## 2016-12-30 DIAGNOSIS — G4700 Insomnia, unspecified: Secondary | ICD-10-CM | POA: Diagnosis not present

## 2016-12-30 MED ORDER — HYDROXYZINE HCL 25 MG PO TABS
25.0000 mg | ORAL_TABLET | Freq: Four times a day (QID) | ORAL | Status: AC | PRN
  Administered 2016-12-30 – 2017-01-01 (×3): 25 mg via ORAL
  Filled 2016-12-30 (×3): qty 1

## 2016-12-30 MED ORDER — HYDROXYZINE PAMOATE 25 MG PO CAPS: 25.0000 mg | ORAL_CAPSULE | Freq: Three times a day (TID) | ORAL | Status: DC | PRN

## 2016-12-30 MED ORDER — LISINOPRIL 20 MG PO TABS
20.0000 mg | ORAL_TABLET | Freq: Every day | ORAL | Status: DC
  Administered 2016-12-30 – 2017-01-03 (×4): 20 mg via ORAL
  Filled 2016-12-30 (×6): qty 1
  Filled 2016-12-30: qty 14
  Filled 2016-12-30 (×2): qty 1

## 2016-12-30 MED ORDER — CHLORDIAZEPOXIDE HCL 25 MG PO CAPS
25.0000 mg | ORAL_CAPSULE | Freq: Every day | ORAL | Status: AC
  Administered 2017-01-02: 25 mg via ORAL
  Filled 2016-12-30: qty 1

## 2016-12-30 MED ORDER — CHLORDIAZEPOXIDE HCL 25 MG PO CAPS
25.0000 mg | ORAL_CAPSULE | Freq: Four times a day (QID) | ORAL | Status: AC
  Administered 2016-12-30 (×3): 25 mg via ORAL
  Filled 2016-12-30 (×3): qty 1

## 2016-12-30 MED ORDER — FLUOXETINE HCL 20 MG PO CAPS
20.0000 mg | ORAL_CAPSULE | Freq: Every day | ORAL | Status: DC
  Administered 2016-12-30: 20 mg via ORAL
  Filled 2016-12-30: qty 1

## 2016-12-30 MED ORDER — VITAMIN B-1 100 MG PO TABS
100.0000 mg | ORAL_TABLET | Freq: Every day | ORAL | Status: DC
  Administered 2016-12-31 – 2017-01-03 (×4): 100 mg via ORAL
  Filled 2016-12-30 (×6): qty 1

## 2016-12-30 MED ORDER — THIAMINE HCL 100 MG/ML IJ SOLN: 100.0000 mg | Freq: Once | INTRAMUSCULAR | Status: DC

## 2016-12-30 MED ORDER — ONDANSETRON HCL 4 MG PO TABS: 4.0000 mg | ORAL_TABLET | Freq: Three times a day (TID) | ORAL | Status: DC | PRN

## 2016-12-30 MED ORDER — ADULT MULTIVITAMIN W/MINERALS CH
1.0000 | ORAL_TABLET | Freq: Every day | ORAL | Status: DC
  Administered 2016-12-30 – 2017-01-03 (×5): 1 via ORAL
  Filled 2016-12-30 (×8): qty 1

## 2016-12-30 MED ORDER — ONDANSETRON 4 MG PO TBDP: 4.0000 mg | ORAL_TABLET | Freq: Four times a day (QID) | ORAL | Status: AC | PRN

## 2016-12-30 MED ORDER — LOPERAMIDE HCL 2 MG PO CAPS: 2.0000 mg | ORAL_CAPSULE | ORAL | Status: AC | PRN

## 2016-12-30 MED ORDER — ACETAMINOPHEN 325 MG PO TABS
650.0000 mg | ORAL_TABLET | ORAL | Status: DC | PRN
  Administered 2016-12-31 – 2017-01-03 (×5): 650 mg via ORAL
  Filled 2016-12-30 (×5): qty 2

## 2016-12-30 MED ORDER — GABAPENTIN 300 MG PO CAPS
300.0000 mg | ORAL_CAPSULE | Freq: Two times a day (BID) | ORAL | Status: DC
  Administered 2016-12-30 – 2017-01-03 (×9): 300 mg via ORAL
  Filled 2016-12-30 (×4): qty 1
  Filled 2016-12-30: qty 28
  Filled 2016-12-30 (×4): qty 1
  Filled 2016-12-30: qty 28
  Filled 2016-12-30 (×4): qty 1

## 2016-12-30 MED ORDER — ALUM & MAG HYDROXIDE-SIMETH 200-200-20 MG/5ML PO SUSP: 30.0000 mL | ORAL | Status: DC | PRN

## 2016-12-30 MED ORDER — TRAZODONE HCL 50 MG PO TABS
50.0000 mg | ORAL_TABLET | Freq: Every evening | ORAL | Status: DC | PRN
  Administered 2016-12-30 – 2017-01-03 (×9): 50 mg via ORAL
  Filled 2016-12-30 (×13): qty 1

## 2016-12-30 MED ORDER — ENSURE ENLIVE PO LIQD
237.0000 mL | Freq: Two times a day (BID) | ORAL | Status: DC
  Administered 2016-12-30 – 2017-01-03 (×7): 237 mL via ORAL

## 2016-12-30 MED ORDER — CHLORDIAZEPOXIDE HCL 25 MG PO CAPS
25.0000 mg | ORAL_CAPSULE | Freq: Three times a day (TID) | ORAL | Status: AC
  Administered 2016-12-31 (×3): 25 mg via ORAL
  Filled 2016-12-30 (×3): qty 1

## 2016-12-30 MED ORDER — CHLORDIAZEPOXIDE HCL 25 MG PO CAPS
25.0000 mg | ORAL_CAPSULE | Freq: Four times a day (QID) | ORAL | Status: AC | PRN
  Administered 2017-01-01: 25 mg via ORAL
  Filled 2016-12-30: qty 1

## 2016-12-30 MED ORDER — GABAPENTIN 300 MG PO CAPS
300.0000 mg | ORAL_CAPSULE | Freq: Two times a day (BID) | ORAL | Status: DC
  Administered 2016-12-30: 300 mg via ORAL
  Filled 2016-12-30: qty 1

## 2016-12-30 MED ORDER — CHLORDIAZEPOXIDE HCL 25 MG PO CAPS
25.0000 mg | ORAL_CAPSULE | ORAL | Status: AC
  Administered 2017-01-01 (×2): 25 mg via ORAL
  Filled 2016-12-30: qty 1

## 2016-12-30 MED ORDER — FLUOXETINE HCL 20 MG PO CAPS
20.0000 mg | ORAL_CAPSULE | Freq: Every day | ORAL | Status: DC
  Administered 2016-12-30 – 2017-01-01 (×3): 20 mg via ORAL
  Filled 2016-12-30 (×5): qty 1

## 2016-12-30 MED ORDER — MAGNESIUM HYDROXIDE 400 MG/5ML PO SUSP: 30.0000 mL | Freq: Every day | ORAL | Status: DC | PRN

## 2016-12-30 NOTE — ED Notes (Signed)
Pt taking a shower 

## 2016-12-30 NOTE — ED Notes (Signed)
[facility] on unit to transfer pt to [facility] Adult unit per MD order. Pt signed for personal property and property given to [facility] transport for transfer. Pt signed e-signature. Ambulatory off unit. 

## 2016-12-30 NOTE — Plan of Care (Signed)
Problem: Safety: Goal: Periods of time without injury will increase Outcome: Progressing Pt remains a low falls risk, denies SI at this time.

## 2016-12-30 NOTE — BH Assessment (Signed)
[facility] Assessment Progress Note  Per Thedore MinsMojeed Akintayo, MD, this pt requires psychiatric hospitalization at this time.  Berneice Heinrichina Tate, RN, [facility] Rm [facility].  Pt has signed Voluntary Admission and Consent for Treatment, as well as Consent to Release Information to his children and his sister, and signed forms [facility].  Pt's nurse, Morrie Sheldonshley, has been notified, and agrees to send original paperwork along with pt via [facility], and to call report to [phone].  Doylene Canninghomas Erickson Yamashiro, MA Triage Specialist [phone]
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
## 2016-12-30 NOTE — Consult Note (Signed)
[facility] Face-to-Face Psychiatry Consult   Reason for Consult:  Suicide plan Referring Physician:  EDP Patient Identification: Zachary [name] MRN:  [id number] Principal Diagnosis: Major depressive disorder, recurrent severe without psychotic features (HCC) Diagnosis:   Patient Active Problem List   Diagnosis Date Noted  . Alcohol dependence with uncomplicated withdrawal (HCC) [F10.230] 12/30/2016    Priority: High  . Major depressive disorder, recurrent severe without psychotic features (HCC) [F33.2] 12/30/2016    Priority: High  . Chest pain [R07.9] 10/28/2015  . Hypertension [I10] 10/28/2015  . Alcohol abuse [F10.10] 10/28/2015  . Hyperglycemia [R73.9] 10/28/2015  . Alcohol withdrawal (HCC) [F10.239] 10/28/2015  . Tobacco abuse [Z72.0] 10/28/2015  . Chest pain at rest [R07.9] 10/28/2015  . HTN (hypertension), benign [I10]     Total Time spent with patient: 45 minutes  Subjective:   Zachary [name] is a 56 y.o. male patient admitted with depression and suicide plan to overdose on his blood pressure medications.  HPI:   56 year old caucasian male presents to the ER with extreme anxiety and verbalizing intent to commit suicide by taking all of his blood pressure pills. Since being admited patient notes a history of depression, anxiety, and alcohol abuse for many years. Patient states that he initially started using alcohol just before his divorce because of all the pressure he had been feeling. Patient notes multiple admissions previously for alcohol recovery treatments but has returned when previous and current girlfriends started drinking.  Patient notes that he has a hard time of coming with panic attacks that he has had for sometime due to life stressors and notes that his anxiety attacks get so bad that he can not eat or sleep and only drinking makes this better. This morning patient notes ongoing anxiety and suicidal ideation. Patient denies homicidal thoughts. Patient is requesting  help in coping skills to reduce feelings of hurting himself and getting his anxiety under control.  Past Psychiatric History: Alcohol abuse, General Anxiety Disorder, Major Depression Disorder  Risk to Self: Suicidal Ideation: Yes-Currently Present Suicidal Intent: Yes-Currently Present Is patient at risk for suicide?: Yes Suicidal Plan?: Yes-Currently Present Specify Current Suicidal Plan: overdose on blood pressure medication Access to Means: Yes Specify Access to Suicidal Means: access to blood pressure meds What has been your use of drugs/alcohol within the last 12 months?: using alcohol  How many times?: 0 Intentional Self Injurious Behavior: None Risk to Others: Homicidal Ideation: No Thoughts of Harm to Others: No Current Homicidal Intent: No Current Homicidal Plan: No Access to Homicidal Means: No Identified Victim: None History of harm to others?: No Assessment of Violence: None Noted Violent Behavior Description: none Does patient have access to weapons?: No Criminal Charges Pending?: No Does patient have a court date: No Prior Inpatient Therapy: Prior Inpatient Therapy: Yes Prior Therapy Dates: multiple Prior Therapy Facilty/Provider(s): [facility], [facility], [facility] Reason for Treatment: depression/anxiety/SA  Prior Outpatient Therapy: Prior Outpatient Therapy: No Does patient have an ACCT team?: No Does patient have Intensive In-House Services?  : No Does patient have [facility] services? : No Does patient have P4CC services?: No  Past Medical History:  Past Medical History:  Diagnosis Date  . Alcohol abuse   . Hypertension     Past Surgical History:  Procedure Laterality Date  . COLONOSCOPY    . MOUTH SURGERY    . NO PAST SURGERIES     Family History:  Family History  Problem Relation Age of Onset  . Heart attack Paternal Grandfather   .   Stroke Mother   . Heart failure Mother   . Heart failure Father    Family Psychiatric  History: unknown Social History:   History  Alcohol Use  . Yes    Comment: daily     History  Drug Use  . Types: Marijuana    Social History   Social History  . Marital status: Divorced    Spouse name: N/A  . Number of children: N/A  . Years of education: N/A   Social History Main Topics  . Smoking status: Current Some Day Smoker    Packs/day: 2.00    Types: Cigarettes  . Smokeless tobacco: Never Used  . Alcohol use Yes     Comment: daily  . Drug use: Yes    Types: Marijuana  . Sexual activity: Not Asked   Other Topics Concern  . None   Social History Narrative  . None   Additional Social History:    Allergies:   Allergies  Allergen Reactions  . Penicillins Other (See Comments)    Childhood allergy Reaction:  Unknown  Has patient had a PCN reaction causing immediate rash, facial/tongue/throat swelling, SOB or lightheadedness with hypotension: Unsure Has patient had a PCN reaction causing severe rash involving mucus membranes or skin necrosis: Unsure Has patient had a PCN reaction that required hospitalization Unsure Has patient had a PCN reaction occurring within the last 10 years: No If all of the above answers are "NO", then may proceed with Cephalosporin use.  . Statins Palpitations    Labs:  Results for orders placed or performed during the hospital encounter of 12/29/16 (from the past 48 hour(s))  Rapid urine drug screen (hospital performed)     Status: None   Collection Time: 12/29/16 12:32 PM  Result Value Ref Range   Opiates NONE DETECTED NONE DETECTED   Cocaine NONE DETECTED NONE DETECTED   Benzodiazepines NONE DETECTED NONE DETECTED   Amphetamines NONE DETECTED NONE DETECTED   Tetrahydrocannabinol NONE DETECTED NONE DETECTED   Barbiturates NONE DETECTED NONE DETECTED    Comment:        DRUG SCREEN FOR MEDICAL PURPOSES ONLY.  IF CONFIRMATION IS NEEDED FOR ANY PURPOSE, NOTIFY LAB WITHIN 5 DAYS.        LOWEST DETECTABLE LIMITS FOR URINE DRUG SCREEN Drug Class       Cutoff  (ng/mL) Amphetamine      1000 Barbiturate      200 Benzodiazepine   200 Tricyclics       300 Opiates          300 Cocaine          300 THC              50   Comprehensive metabolic panel     Status: Abnormal   Collection Time: 12/29/16  1:02 PM  Result Value Ref Range   Sodium 132 (L) 135 - 145 mmol/L   Potassium 4.0 3.5 - 5.1 mmol/L   Chloride 100 (L) 101 - 111 mmol/L   CO2 20 (L) 22 - 32 mmol/L   Glucose, Bld 98 65 - 99 mg/dL   BUN 9 6 - 20 mg/dL   Creatinine, Ser 0.77 0.61 - 1.24 mg/dL   Calcium 9.0 8.9 - 10.3 mg/dL   Total Protein 6.8 6.5 - 8.1 g/dL   Albumin 4.3 3.5 - 5.0 g/dL   AST 19 15 - 41 U/L   ALT 26 17 - 63 U/L   Alkaline Phosphatase 81 38 - 126   U/L   Total Bilirubin 0.6 0.3 - 1.2 mg/dL   GFR calc non Af Amer >60 >60 mL/min   GFR calc Af Amer >60 >60 mL/min    Comment: (NOTE) The eGFR has been calculated using the CKD EPI equation. This calculation has not been validated in all clinical situations. eGFR's persistently <60 mL/min signify possible Chronic Kidney Disease.    Anion gap 12 5 - 15  Ethanol     Status: Abnormal   Collection Time: 12/29/16  1:02 PM  Result Value Ref Range   Alcohol, Ethyl (B) 75 (H) <10 mg/dL    Comment:        LOWEST DETECTABLE LIMIT FOR SERUM ALCOHOL IS 10 mg/dL FOR MEDICAL PURPOSES ONLY   Salicylate level     Status: None   Collection Time: 12/29/16  1:02 PM  Result Value Ref Range   Salicylate Lvl <7.0 2.8 - 30.0 mg/dL  Acetaminophen level     Status: Abnormal   Collection Time: 12/29/16  1:02 PM  Result Value Ref Range   Acetaminophen (Tylenol), Serum <10 (L) 10 - 30 ug/mL    Comment:        THERAPEUTIC CONCENTRATIONS VARY SIGNIFICANTLY. A RANGE OF 10-30 ug/mL MAY BE AN EFFECTIVE CONCENTRATION FOR MANY PATIENTS. HOWEVER, SOME ARE BEST TREATED AT CONCENTRATIONS OUTSIDE THIS RANGE. ACETAMINOPHEN CONCENTRATIONS >150 ug/mL AT 4 HOURS AFTER INGESTION AND >50 ug/mL AT 12 HOURS AFTER INGESTION ARE OFTEN ASSOCIATED  WITH TOXIC REACTIONS.   cbc     Status: Abnormal   Collection Time: 12/29/16  1:02 PM  Result Value Ref Range   WBC 11.3 (H) 4.0 - 10.5 K/uL   RBC 4.74 4.22 - 5.81 MIL/uL   Hemoglobin 15.3 13.0 - 17.0 g/dL   HCT 41.9 39.0 - 52.0 %   MCV 88.4 78.0 - 100.0 fL   MCH 32.3 26.0 - 34.0 pg   MCHC 36.5 (H) 30.0 - 36.0 g/dL   RDW 12.1 11.5 - 15.5 %   Platelets 210 150 - 400 K/uL    Current Facility-Administered Medications  Medication Dose Route Frequency Provider Last Rate Last Dose  . acetaminophen (TYLENOL) tablet 650 mg  650 mg Oral Q4H PRN Law, Alexandra M, PA-C   650 mg at 12/29/16 2204  . LORazepam (ATIVAN) injection 0-4 mg  0-4 mg Intravenous Q6H Law, Alexandra M, PA-C       Or  . LORazepam (ATIVAN) tablet 0-4 mg  0-4 mg Oral Q6H Law, Alexandra M, PA-C   2 mg at 12/30/16 0957  . [START ON 01/01/2017] LORazepam (ATIVAN) injection 0-4 mg  0-4 mg Intravenous Q12H Law, Alexandra M, PA-C       Or  . [START ON 01/01/2017] LORazepam (ATIVAN) tablet 0-4 mg  0-4 mg Oral Q12H Law, Alexandra M, PA-C      . ondansetron (ZOFRAN) tablet 4 mg  4 mg Oral Q8H PRN Law, Alexandra M, PA-C      . thiamine (VITAMIN B-1) tablet 100 mg  100 mg Oral Daily Law, Alexandra M, PA-C   100 mg at 12/30/16 0905   Or  . thiamine (B-1) injection 100 mg  100 mg Intravenous Daily Law, Alexandra M, PA-C       Current Outpatient Prescriptions  Medication Sig Dispense Refill  . aspirin-sod bicarb-citric acid (ALKA-SELTZER) 325 MG TBEF tablet Take 325 mg by mouth every 6 (six) hours as needed (for heartburn/indigestion).     . FLUoxetine (PROZAC) 10 MG capsule Take 10 mg by mouth   daily. Patient taking 20 mg daily, not directed by doctor    . hydrOXYzine (VISTARIL) 25 MG capsule Take 25-50 mg by mouth 3 (three) times daily as needed for anxiety. Patient says he was taking 3 to 4 tablets every 8 hours for anxiety    . lisinopril (PRINIVIL,ZESTRIL) 10 MG tablet Take 20 mg by mouth daily.    . LORazepam (ATIVAN) 0.5 MG  tablet Take 0.5 mg by mouth every 8 (eight) hours as needed. for anxiety  0  . chlordiazePOXIDE (LIBRIUM) 25 MG capsule 50mg PO TID x 1D, then 25-50mg PO BID X 1D, then 25-50mg PO QD X 1D (Patient not taking: Reported on 12/29/2016) 10 capsule 0  . LORazepam (ATIVAN) 1 MG tablet Take 1 tablet (1 mg total) by mouth 3 (three) times daily as needed for anxiety. (Patient not taking: Reported on 12/29/2016) 10 tablet 0  . Multiple Vitamin (MULTIVITAMIN) capsule Take 1 capsule by mouth daily. (Patient not taking: Reported on 08/06/2016) 30 capsule 0  . promethazine (PHENERGAN) 25 MG tablet Take 1 tablet (25 mg total) by mouth every 8 (eight) hours as needed for nausea. (Patient not taking: Reported on 12/29/2016) 12 tablet 0  . thiamine (VITAMIN B-1) 100 MG tablet Take 1 tablet (100 mg total) by mouth daily. (Patient not taking: Reported on 08/06/2016) 30 tablet 0    Musculoskeletal: Strength & Muscle Tone: within normal limits Gait & Station: normal Patient leans: Right  Psychiatric Specialty Exam: Physical Exam  Constitutional: He is oriented to person, place, and time. He appears well-developed.  HENT:  Head: Normocephalic and atraumatic.  Eyes: Pupils are equal, round, and reactive to light.  Neck: Normal range of motion.  Neurological: He is alert and oriented to person, place, and time.  Psychiatric: His speech is normal. His mood appears anxious. He is withdrawn. Cognition and memory are impaired. He expresses impulsivity. He expresses suicidal ideation. He expresses suicidal plans.    Review of Systems  Psychiatric/Behavioral: Positive for depression, substance abuse and suicidal ideas. The patient is nervous/anxious.   All other systems reviewed and are negative.   Blood pressure (!) 144/73, pulse 68, temperature 99.1 F (37.3 C), temperature source Oral, resp. rate 18, SpO2 96 %.There is no height or weight on file to calculate BMI.  General Appearance: Disheveled and Guarded  Eye  Contact:  Minimal  Speech:  Normal Rate and Slow  Volume:  Normal  Mood:  Anxious and Depressed  Affect:  Depressed  Thought Process:  Coherent and Linear  Orientation:  Full (Time, Place, and Person)  Thought Content:  Logical  Suicidal Thoughts:  Yes.  with intent/plan  Homicidal Thoughts:  No  Memory:  Immediate;   Good Recent;   Good Remote;   Good  Judgement:  Fair  Insight:  Fair  Psychomotor Activity:  Normal  Concentration:  Concentration: Good and Attention Span: Good  Recall:  Good  Fund of Knowledge:  Good  Language:  Good  Akathisia:  No  Handed:  Right  AIMS (if indicated):     Assets:  Communication Skills Desire for Improvement Resilience Social Support  ADL's:  Intact  Cognition:  WNL  Sleep:        Treatment Plan Summary: Daily contact with patient to assess and evaluate symptoms and progress in treatment, Medication management and Plan major depressive disorder, recurrent, severe without psychosis:  -Crisis stabilization -Medication management:  Ativan alcohol detox protocol started, Prozac increased from 10 mg daily to 20 mg daily   for depression, gabapentin 200 mg BID for anxiety and detox -Individual and substance abuse counseling  Disposition: No evidence of imminent risk to self or others at present.    Waylan Boga, NP 12/30/2016 10:12 AM
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
## 2016-12-30 NOTE — Tx Team (Signed)
Initial Treatment Plan 12/30/2016 1300 Zachary [name] [id number]    PATIENT STRESSORS: Financial difficulties Marital or family conflict Medication change or noncompliance Occupational concerns Substance abuse   PATIENT STRENGTHS: Ability for insight Average or above average intelligence Capable of independent living Communication skills General fund of knowledge Motivation for treatment/growth Physical Health Work skills   PATIENT IDENTIFIED PROBLEMS:   "I want to find out the cause of my anxiety."  "I want to find coping skills - something other than substances - to manage my anxiety."                 DISCHARGE CRITERIA:  Improved stabilization in mood, thinking, and/or behavior Need for constant or close observation no longer present Reduction of life-threatening or endangering symptoms to within safe limits Verbal commitment to aftercare and medication compliance Withdrawal symptoms are absent or subacute and managed without 24-hour nursing intervention  PRELIMINARY DISCHARGE PLAN: Attend 12-step recovery group Outpatient therapy Participate in family therapy Return to previous living arrangement Return to previous work or school arrangements  PATIENT/FAMILY INVOLVEMENT: This treatment plan has been presented to and reviewed with the patient, Zachary [name], and/or family member.  The patient and family have been given the opportunity to ask questions and make suggestions.  Lawrence MarseillesFriedman, Jerod Mcquain Eakes, RN 12/30/2016, 1300
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
## 2016-12-31 NOTE — BHH Suicide Risk Assessment (Signed)
[facility] Admission Suicide Risk Assessment   Nursing information obtained from:    Demographic factors:    Current Mental Status:    Loss Factors:    Historical Factors:    Risk Reduction Factors:     Total Time spent with patient: 45 minutes Principal Problem: <principal problem not specified> Diagnosis:   Patient Active Problem List   Diagnosis Date Noted  . Alcohol dependence with uncomplicated withdrawal (HCC) [F10.230] 12/30/2016  . Major depressive disorder, recurrent severe without psychotic features (HCC) [F33.2] 12/30/2016  . Chest pain [R07.9] 10/28/2015  . Hypertension [I10] 10/28/2015  . Alcohol abuse [F10.10] 10/28/2015  . Hyperglycemia [R73.9] 10/28/2015  . Alcohol withdrawal (HCC) [F10.239] 10/28/2015  . Tobacco abuse [Z72.0] 10/28/2015  . Chest pain at rest [R07.9] 10/28/2015  . HTN (hypertension), benign [I10]    Subjective Data: Patient is 56 year old Caucasian unemployed man who was admitted due to increased depression and heavy drinking.  He is having suicidal thoughts and plan to take overdose on his blood pressure medication.  He admitted noncompliant with his medication follow-up.  He has multiple hospitalization for alcohol detox.  His alcohol level is 75.  He is having withdrawal symptoms.  He denies any paranoia or any hallucination but admitted severe depression, feeling hopelessness, worthlessness and anhedonia.  He lives by himself.  He has family issues with his son.  Patient requires inpatient treatment and stabilization.  Continued Clinical Symptoms:  Alcohol Use Disorder Identification Test Final Score (AUDIT): 31 The "Alcohol Use Disorders Identification Test", Guidelines for Use in Primary Care, Second Edition.  World Science writer [facility]). Score between 0-7:  no or low risk or alcohol related problems. Score between 8-15:  moderate risk of alcohol related problems. Score between 16-19:  high risk of alcohol related problems. Score 20 or above:   warrants further diagnostic evaluation for alcohol dependence and treatment.   CLINICAL FACTORS:   Severe Anxiety and/or Agitation Depression:   Comorbid alcohol abuse/dependence Hopelessness Impulsivity Insomnia Alcohol/Substance Abuse/Dependencies   Musculoskeletal: Strength & Muscle Tone: within normal limits Gait & Station: normal Patient leans: Backward  Psychiatric Specialty Exam: Physical Exam  ROS  Blood pressure 98/62, pulse 82, temperature 97.9 F (36.6 C), temperature source Oral, resp. rate 16, height 5\' 10"  (1.778 m), weight 81.6 kg (180 lb).Body mass index is 25.83 kg/m.  General Appearance: Disheveled  Eye Contact:  Fair  Speech:  Slow  Volume:  Normal  Mood:  Anxious and Hopeless  Affect:  Constricted and Depressed  Thought Process:  Goal Directed  Orientation:  Full (Time, Place, and Person)  Thought Content:  Rumination  Suicidal Thoughts:  Yes.  with intent/plan  Homicidal Thoughts:  No  Memory:  Immediate;   Good Recent;   Fair Remote;   Fair  Judgement:  Fair  Insight:  Fair  Psychomotor Activity:  Decreased and Tremor  Concentration:  Concentration: Fair and Attention Span: Fair  Recall:  [employer] of Knowledge:  Fair  Language:  Good  Akathisia:  No  Handed:  Right  AIMS (if indicated):     Assets:  Communication Skills Desire for Improvement Housing  ADL's:  Intact  Cognition:  WNL  Sleep:         COGNITIVE FEATURES THAT CONTRIBUTE TO RISK:  Closed-mindedness, Polarized thinking and Thought constriction (tunnel vision)    SUICIDE RISK:   Moderate:  Frequent suicidal ideation with limited intensity, and duration, some specificity in terms of plans, no associated intent, good self-control, limited dysphoria/symptomatology,  some risk factors present, and identifiable protective factors, including available and accessible social support.  PLAN OF CARE: The patient will be admitted in the unit.  He requires stabilization and  treatment.  We will start detox protocol and restart his medication.  Please see history and physical for more detailed information.  I certify that inpatient services furnished can reasonably be expected to improve the patient's condition.   Breanah Faddis T., MD 12/31/2016, 9:16 AM

## 2016-12-31 NOTE — Progress Notes (Signed)
D: Patient observed up and in hallway. States the dayroom is too disruptive for him to tolerate as a few peers are quite loud and boistrous. Frequent contacts made 1:1 throughout shift to offer support. Patient verbalizes to this Clinical research associatewriter "I am struggling this morning. I have a terrible headache and I'm anxious. The noise is too much. And what's being said is all crap." Patient's affect flat, sad, anxious and depressed with congruent mood. Per self inventory and discussions with writer, rates depression at a 9/10, hopelessness at a 9/10 and anxiety at a 10/10. Rates sleep as good, appetite as good, energy as low and concentration as poor.  States goal for today is to "sleep." Headache is rated at a 7/10. Withdrawal symptoms include shakiness, skin crawling, anxiety.   A: Medicated per orders, librium protocol in place for withdrawal management. Tylenol prn given for discomfort. Level III obs in place for safety. Emotional support offered and self inventory reviewed. Encouraged completion of Suicide Safety Plan and programming participation. Discussed POC with MD, SW.  Fall prevention plan in place and reviewed with patient as pt is a high fall risk due to hx of frequent falls.   R: Patient verbalizes understanding of POC, falls prevention education. On reassess, patient is asleep. Patient endorsing passive SI but denies plan, intent. Verbal contract in place for safety. No HI/AVH and remains safe on level III obs. Will continue to monitor closely and make verbal contact frequently.

## 2016-12-31 NOTE — BHH Group Notes (Signed)
LCSW Group Therapy Note  12/31/2016     10:00-11:00AM  Type of Therapy and Topic:  Group Therapy:  Decisional Balance/Substance Use  Participation Level:  Active        . Description of Group:  The main focus of today's process group was learning how to use a decisional balance exercise to make a decision about whether to change an unhealthy coping skill, as well as how to use the information gathered in the actual process of planning that change.  Patients listed some of their most frequently utilized unhealthy coping techniques and CSW pointed out the similarities.  Motivational Interviewing and the whiteboard were utilized to help patients explore in-depth the perceived benefits and costs of a specific, shared unhealthy coping technique (drinking & drugging) as well as the benefits and costs of replacing that with other, healthy coping skills.  A handout was distributed for patients to be able to do this exercise for themselves.     Therapeutic Goals 1. Patient will be able to utilize the decision balance exercise on their own 2. Patient will list coping skills they use to fulfill their needs 3. Patient will identify the differences between healthy and  unhealthy coping skills 4. Patient will verbalize the costs and benefits of drinking/drugging versus making the choice to change 5. Patient will learn how to use the exercise to identify the most important supports to put in place so that they can succeed in a change to which they commit  Summary of Patient Progress: During group, patient expressed that he has been dealing with anxiety and has been medicating himself with alcohol.  He talked about depending so much on alcohol physically that even while drinking it made him throw up, he had to continue to drink to stop shaking and withdrawals.  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  He talked freely and showed  insight.   Therapeutic Modalities Cognitive Behavioral Therapy Motivational Interviewing   Lynnell ChadMareida J Grossman-Orr, LCSW

## 2016-12-31 NOTE — Progress Notes (Signed)
  DATA ACTION RESPONSE  Objective- Pt. is visible in the dayroom, seen eating a snack. Presents with a depressed/anxious affect and mood. Currently endorsing withdrawal s/s. Provider on call notified to obtain sleep medication per Pt request.   Subjective- Denies having any SI/HI/AVH/Pain at this time.Is cooperative and remains safe on the unit.  1:1 interaction in private to establish rapport. Encouragement, education, & support given from staff.  PRN Vistaril requested; will re-eval accordingly.   Safety maintained with Q 15 checks. Continue with POC.

## 2016-12-31 NOTE — Progress Notes (Addendum)
DATA ACTION RESPONSE  Objective- Pt. is visible in the room, seen resting in bed with eyes open. Presents with a depressed/anxious affect and mood.Pt appears isolative; stays in room for much of this shift. Pleasant.  Subjective- Denies having any SI/HI/AVH/Pain at this time.Is cooperative and remains safe on the unit.  1:1 interaction in private to establish rapport. Encouragement, education, & support given from staff. PRN Vistaril and Trazodone with repeat requested; will re-eval accordingly.   Safety maintained with Q 15 checks. Continue with POC.

## 2016-12-31 NOTE — [facility] Suicide Risk Assessment (Signed)
[facility] INPATIENT:  Family/Significant Other Suicide Prevention Education  Suicide Prevention Education:  Patient Refusal for Family/Significant Other Suicide Prevention Education: The patient Zachary [name] has refused to provide written consent for family/significant other to be provided Family/Significant Other Suicide Prevention Education during admission and/or prior to discharge.  Physician notified.   Sallee Langenne C Cunningham 12/31/2016, 5:53 PM
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
## 2016-12-31 NOTE — Plan of Care (Signed)
Problem: Medication: Goal: Compliance with prescribed medication regimen will improve Outcome: Progressing Pt is taking medications as prescribed.   

## 2017-01-01 DIAGNOSIS — Y903 Blood alcohol level of 60-79 mg/100 ml: Secondary | ICD-10-CM

## 2017-01-01 DIAGNOSIS — Z79899 Other long term (current) drug therapy: Secondary | ICD-10-CM

## 2017-01-01 DIAGNOSIS — F332 Major depressive disorder, recurrent severe without psychotic features: Principal | ICD-10-CM

## 2017-01-01 DIAGNOSIS — G4700 Insomnia, unspecified: Secondary | ICD-10-CM

## 2017-01-01 DIAGNOSIS — M545 Low back pain: Secondary | ICD-10-CM

## 2017-01-01 DIAGNOSIS — F10230 Alcohol dependence with withdrawal, uncomplicated: Secondary | ICD-10-CM

## 2017-01-01 DIAGNOSIS — F17210 Nicotine dependence, cigarettes, uncomplicated: Secondary | ICD-10-CM

## 2017-01-01 MED ORDER — CYCLOBENZAPRINE HCL 5 MG PO TABS
7.5000 mg | ORAL_TABLET | Freq: Two times a day (BID) | ORAL | Status: DC
  Administered 2017-01-01 – 2017-01-03 (×6): 7.5 mg via ORAL
  Filled 2017-01-01 (×4): qty 1.5
  Filled 2017-01-01: qty 2
  Filled 2017-01-01 (×3): qty 1.5
  Filled 2017-01-01 (×2): qty 42
  Filled 2017-01-01 (×2): qty 1.5

## 2017-01-01 MED ORDER — FLUOXETINE HCL 20 MG PO CAPS
40.0000 mg | ORAL_CAPSULE | Freq: Every day | ORAL | Status: DC
  Administered 2017-01-02 – 2017-01-03 (×2): 40 mg via ORAL
  Filled 2017-01-01 (×3): qty 2
  Filled 2017-01-01: qty 28
  Filled 2017-01-01: qty 2

## 2017-01-01 MED ORDER — LIDOCAINE 5 % EX PTCH
1.0000 | MEDICATED_PATCH | CUTANEOUS | Status: DC
  Administered 2017-01-01 – 2017-01-03 (×3): 1 via TRANSDERMAL
  Filled 2017-01-01 (×4): qty 1

## 2017-01-01 NOTE — Progress Notes (Signed)
D  Pt. Denies SI and HI, did complain of a headache which he received tylenol to relieve.    A Writer offered support and encouragement.  Writer discussed pt.'s day as well as coping skills .  R  Pt. Rates his day as bad and anxiety and depression as high.  Pt. Isolates in his room this pm.  Pt. Remains safe on the unit.

## 2017-01-01 NOTE — Progress Notes (Signed)
[facility] MD Progress Note  01/01/2017 10:23 AM Zachary [name]  MRN:  [id number]   Subjective: Zachary [name] reports " I am having leg cramping and lower back pain from a pinched nerve"  Objective:  Zachary [name] is awake, alert and oriented. Seen resting in bed. Reports inpatient previous from alcohol detox. States he feels motivated this time to due "what is right for my grandchildren"  Denies suicidal or homicidal ideation during this assessment. Denies auditory or visual hallucination and does not appear to be responding to internal stimuli. Support, encouragement and reassurance was provided.    Principal Problem: Major depressive disorder, recurrent severe without psychotic features (HCC) Diagnosis:   Patient Active Problem List   Diagnosis Date Noted  . Alcohol dependence with uncomplicated withdrawal (HCC) [F10.230] 12/30/2016  . Major depressive disorder, recurrent severe without psychotic features (HCC) [F33.2] 12/30/2016  . Chest pain [R07.9] 10/28/2015  . Hypertension [I10] 10/28/2015  . Alcohol abuse [F10.10] 10/28/2015  . Hyperglycemia [R73.9] 10/28/2015  . Alcohol withdrawal (HCC) [F10.239] 10/28/2015  . Tobacco abuse [Z72.0] 10/28/2015  . Chest pain at rest [R07.9] 10/28/2015  . HTN (hypertension), benign [I10]    Total Time spent with patient: 20 minutes  Past Psychiatric History:  Past Medical History:  Past Medical History:  Diagnosis Date  . Alcohol abuse   . Hypertension     Past Surgical History:  Procedure Laterality Date  . COLONOSCOPY    . MOUTH SURGERY    . NO PAST SURGERIES     Family History:  Family History  Problem Relation Age of Onset  . Heart attack Paternal Grandfather   . Stroke Mother   . Heart failure Mother   . Heart failure Father    Family Psychiatric  History:  Social History:  History  Alcohol Use  . Yes    Comment: daily     History  Drug Use  . Types: Marijuana    Social History   Social History  . Marital status:  Divorced    Spouse name: N/A  . Number of children: N/A  . Years of education: N/A   Social History Main Topics  . Smoking status: Current Some Day Smoker    Packs/day: 2.00    Types: Cigarettes  . Smokeless tobacco: Never Used  . Alcohol use Yes     Comment: daily  . Drug use: Yes    Types: Marijuana  . Sexual activity: Not Asked   Other Topics Concern  . None   Social History Narrative  . None   Additional Social History:                         Sleep: Fair  Appetite:  Fair  Current Medications: Current Facility-Administered Medications  Medication Dose Route Frequency Provider Last Rate Last Dose  . acetaminophen (TYLENOL) tablet 650 mg  650 mg Oral Q4H PRN Charm Rings, NP   650 mg at 01/01/17 0803  . alum & mag hydroxide-simeth (MAALOX/MYLANTA) 200-200-20 MG/5ML suspension 30 mL  30 mL Oral Q4H PRN Charm Rings, NP      . chlordiazePOXIDE (LIBRIUM) capsule 25 mg  25 mg Oral Q6H PRN Armandina Stammer I, NP      . Melene Muller ON 01/02/2017] chlordiazePOXIDE (LIBRIUM) capsule 25 mg  25 mg Oral Daily Nwoko, Agnes I, NP      . cyclobenzaprine (FLEXERIL) tablet 7.5 mg  7.5 mg Oral BID Oneta Rack, NP   7.5  mg at 01/01/17 0904  . feeding supplement (ENSURE ENLIVE) (ENSURE ENLIVE) liquid 237 mL  237 mL Oral BID BM Cobos, Rockey Situ, MD   237 mL at 01/01/17 0808  . [START ON 01/02/2017] FLUoxetine (PROZAC) capsule 40 mg  40 mg Oral Daily Oneta Rack, NP      . gabapentin (NEURONTIN) capsule 300 mg  300 mg Oral BID Charm Rings, NP   300 mg at 01/01/17 0805  . hydrOXYzine (ATARAX/VISTARIL) tablet 25 mg  25 mg Oral Q6H PRN Armandina Stammer I, NP   25 mg at 01/01/17 0904  . lisinopril (PRINIVIL,ZESTRIL) tablet 20 mg  20 mg Oral Daily Charm Rings, NP   20 mg at 01/01/17 0806  . loperamide (IMODIUM) capsule 2-4 mg  2-4 mg Oral PRN Nwoko, Agnes I, NP      . magnesium hydroxide (MILK OF MAGNESIA) suspension 30 mL  30 mL Oral Daily PRN Charm Rings, NP      .  multivitamin with minerals tablet 1 tablet  1 tablet Oral Daily Armandina Stammer I, NP   1 tablet at 01/01/17 0805  . ondansetron (ZOFRAN-ODT) disintegrating tablet 4 mg  4 mg Oral Q6H PRN Nwoko, Agnes I, NP      . thiamine (B-1) injection 100 mg  100 mg Intramuscular Once Nwoko, Nicole Kindred I, NP      . thiamine (VITAMIN B-1) tablet 100 mg  100 mg Oral Daily Nwoko, Agnes I, NP   100 mg at 01/01/17 0800  . traZODone (DESYREL) tablet 50 mg  50 mg Oral QHS,MR X 1 Nira Conn A, NP   50 mg at 12/31/16 2221    Lab Results: No results found for this or any previous visit (from the past 48 hour(s)).  Blood Alcohol level:  Lab Results  Component Value Date   ETH 75 (H) 12/29/2016   ETH 114 (H) 10/20/2016    Metabolic Disorder Labs: Lab Results  Component Value Date   HGBA1C 5.3 10/28/2015   MPG 105 10/28/2015   MPG 111 04/18/2007   No results found for: PROLACTIN Lab Results  Component Value Date   CHOL 309 (H) 10/29/2015   TRIG 153 (H) 10/29/2015   HDL 94 10/29/2015   CHOLHDL 3.3 10/29/2015   VLDL 31 10/29/2015   LDLCALC 184 (H) 10/29/2015   LDLCALC (H) 04/19/2007    142        Total Cholesterol/HDL:CHD Risk Coronary Heart Disease Risk Table                     Men   Women  1/2 Average Risk   3.4   3.3    Physical Findings: AIMS: Facial and Oral Movements Muscles of Facial Expression: None, normal Lips and Perioral Area: None, normal Jaw: None, normal Tongue: None, normal,Extremity Movements Upper (arms, wrists, hands, fingers): None, normal Lower (legs, knees, ankles, toes): None, normal, Trunk Movements Neck, shoulders, hips: None, normal, Overall Severity Severity of abnormal movements (highest score from questions above): None, normal Incapacitation due to abnormal movements: None, normal Patient's awareness of abnormal movements (rate only patient's report): No Awareness, Dental Status Current problems with teeth and/or dentures?: No Does patient usually wear dentures?: No   CIWA:  CIWA-Ar Total: 4 COWS:     Musculoskeletal: Strength & Muscle Tone: within normal limits Gait & Station: normal Patient leans: N/A  Psychiatric Specialty Exam: Physical Exam  Vitals reviewed. Constitutional: He appears well-developed.  Musculoskeletal: Normal range of motion.  Psychiatric: He has a normal mood and affect. His behavior is normal.    Review of Systems  Psychiatric/Behavioral: Positive for depression and substance abuse. The patient is nervous/anxious.     Blood pressure 135/80, pulse 82, temperature 98 F (36.7 C), temperature source Oral, resp. rate 18, height 5\' 10"  (1.778 m), weight 81.6 kg (180 lb).Body mass index is 25.83 kg/m.  General Appearance: Casual  Eye Contact:  Good  Speech:  Clear and Coherent  Volume:  Normal  Mood:  Anxious and Depressed  Affect:  Congruent  Thought Process:  Coherent  Orientation:  Full (Time, Place, and Person)  Thought Content:  Hallucinations: None  Suicidal Thoughts:  No  Homicidal Thoughts:  No  Memory:  Immediate;   Fair Recent;   Fair Remote;   Fair  Judgement:  Fair  Insight:  Fair  Psychomotor Activity:  Normal  Concentration:  Concentration: Fair  Recall:  [employer]  Fund of Knowledge:  Fair  Language:  Fair  Akathisia:  No  Handed:  Right  AIMS (if indicated):     Assets:  Communication Skills Desire for Improvement  ADL's:  Intact  Cognition:  WNL  Sleep:        I agree with current treatment plan on 01/01/2017, Patient seen face-to-face for psychiatric evaluation follow-up, chart reviewed. Reviewed the information documented and agree with the treatment plan.  Treatment Plan Summary: Daily contact with patient to assess and evaluate symptoms and progress in treatment and Medication management   Start Flexeril 7.5mg  PO BID for Muscle ache/spams Start Liderm patch for lower back pain  Continue with Neurontin 300 mg and Prozac 40 mg  for mood stabilization. Continue with Trazodone 50 mg for  insomnia Started on CWIA/ Librium Protocol Will continue to monitor vitals ,medication compliance and treatment side effects while patient is here.   Reviewed labs: BAL - 75, UDS -  CSW will start working on disposition.  Patient to participate in therapeutic milieu  Oneta Rackanika N Lewis, NP 01/01/2017, 10:23 AM

## 2017-01-01 NOTE — [facility] Group Notes (Signed)
[facility] LCSW Group Therapy Note  Date/Time:  01/01/2017 1:15-2:15PM  Type of Therapy and Topic:  Group Therapy:  Healthy and Unhealthy Supports  Participation Level:  Did Not Attend   Description of Group:  Patients in this group were introduced to the idea of adding a variety of healthy supports to address the various needs in their lives. The picture on the front of Sunday's workbook was used to demonstrate why more supports are needed in every patient's life.  Patients identified and described healthy supports versus unhealthy supports in general, then gave examples of each in their own lives.   They discussed what additional healthy supports could be helpful in their recovery and wellness after discharge in order to prevent future hospitalizations.   An emphasis was placed on using counselor, doctor, therapy groups, 12-step groups, and problem-specific support groups to expand supports.  They also worked as a group on developing a specific plan for several patients to deal with unhealthy supports through boundary-setting, psychoeducation with loved ones, and even termination of relationships.   Therapeutic Goals:   1)  discuss importance of adding supports to stay well once out of the hospital  2)  compare healthy versus unhealthy supports and identify some examples of each  3)  generate ideas and descriptions of healthy supports that can be added  4)  offer mutual support about how to address unhealthy supports  5)  encourage active participation in and adherence to discharge plan    Summary of Patient Progress:  N/A   Therapeutic Modalities:   Motivational Interviewing Brief Solution-Focused Therapy  Milderd Manocchio Grossman-Orr, LCSW 01/01/2017, 2:15PM 

## 2017-01-01 NOTE — Progress Notes (Signed)
Nursing Note 01/01/2017 [id number]  Data Reports sleeping good with PRN sleep med.  Rates depression 8/10, hopelessness 8/10, and anxiety 8/10. Affect sad/flat depressed.  Passive SI, agrees to come to staff before acting on harmful thoughts.  Denies HI, AVH.  Spent majority of day in room "if I go out in the dayroom I know I'll talk about using."   Action Spoke with patient 1:1, nurse offered support to patient throughout shift.  Continues to be monitored on 15 minute checks for safety.  Response Remains safe on unit, isolative.

## 2017-01-01 NOTE — Progress Notes (Signed)
NUTRITION ASSESSMENT  Pt identified as at risk on the Malnutrition Screen Tool  INTERVENTION: 1. Supplements: Ensure Enlive po BID, each supplement provides 350 kcal and 20 grams of protein  NUTRITION DIAGNOSIS: Unintentional weight loss related to sub-optimal intake as evidenced by pt report.   Goal: Pt to meet >/= 90% of their estimated nutrition needs.  Monitor:  PO intake  Assessment:  Pt admitted with depression and ETOH abuse (2- 12 packs of beer daily). Pt reports 20 lb of weight loss over the past 2 months. Chart reveals 18 lb weight loss since 8/9 (9% wt loss x 2.5 months, significant for time frame). Pt has been ordered ensure supplements, will continue order.   Height: Ht Readings from Last 1 Encounters:  12/30/16 5\' 10"  (1.778 m)    Weight: Wt Readings from Last 1 Encounters:  12/30/16 180 lb (81.6 kg)    Weight Hx: Wt Readings from Last 10 Encounters:  12/30/16 180 lb (81.6 kg)  10/20/16 198 lb 9.6 oz (90.1 kg)  04/12/16 191 lb 9 oz (86.9 kg)  10/30/15 191 lb 9.6 oz (86.9 kg)    BMI:  Body mass index is 25.83 kg/m. Pt meets criteria for normal based on current BMI.  Estimated Nutritional Needs: Kcal: 25-30 kcal/kg Protein: > 1 gram protein/kg Fluid: 1 ml/kcal  Diet Order: Diet regular Room service appropriate? Yes; Fluid consistency: Thin Pt is also offered choice of unit snacks mid-morning and mid-afternoon.  Pt is eating as desired.   Lab results and medications reviewed.   Tilda FrancoLindsey Laisa Larrick, MS, RD, LDN [facility] Clinical Dietitian Pager: [phone] After Hours Pager: [phone]

## 2017-01-01 NOTE — Progress Notes (Signed)
Patient did not attend the evening speaker AA meeting. Pt was notified that group was beginning but remained in bed.   

## 2017-01-02 MED ORDER — CHLORPROMAZINE HCL 25 MG PO TABS
25.0000 mg | ORAL_TABLET | Freq: Three times a day (TID) | ORAL | Status: DC
  Administered 2017-01-02 – 2017-01-03 (×4): 25 mg via ORAL
  Filled 2017-01-02 (×3): qty 1
  Filled 2017-01-02: qty 42
  Filled 2017-01-02 (×3): qty 1
  Filled 2017-01-02: qty 42
  Filled 2017-01-02: qty 1
  Filled 2017-01-02: qty 42
  Filled 2017-01-02: qty 1

## 2017-01-02 NOTE — Plan of Care (Signed)
Problem: Activity: Goal: Interest or engagement in activities will improve Outcome: Progressing Patient remains isolative but made efforts today to attend group and be in milieu despite social anxiety.  Problem: Education: Goal: Knowledge of [place] [facility] Education information/materials will improve Outcome: Completed/Met Date Met: 01/02/17 Completed on admission Goal: Emotional status will improve Outcome: Progressing Patient reports feeling anxious and depressed today, but better than yesterday.

## 2017-01-02 NOTE — [facility] Group Notes (Signed)
[facility] [employer] Group Therapy 01/02/2017 1:15pm  Type of Therapy: Mental Health Association Presentation  Participation Level: Active  Participation Quality: Attentive  Affect: Appropriate  Cognitive: Oriented  Insight: Developing/Improving  Engagement in Therapy: Engaged  Modes of Intervention: Discussion, Education and Socialization  Summary of Progress/Problems: Mental Health Association (MHA) Speaker came to talk about his personal journey with mental health. The pt processed ways by which to relate to the speaker. MHA speaker provided handouts and educational information pertaining to groups and services offered by the MHA. Pt was engaged in speaker's presentation and was receptive to resources provided.    Inice Sanluis N Smart, LCSW 01/02/2017 2:38 PM  

## 2017-01-02 NOTE — Progress Notes (Signed)
Nursing Note 01/02/2017 [id number]  Data Reports sleeping fair with PRN sleep med.  Rates depression 8/10, hopelessness 8/10, and anxiety 8/10. Affect sad, depressed. Denies HI, AVH.  Passive SI but agrees to come to staff before acting on any harmful thoughts.  Patient came out of room more today with encouragement "It's hard, I can't even go to the grocery store without getting really anxious.  I went to group for a few minutes but I couldn't stay in there any longer."   Action Spoke with patient 1:1, nurse offered support to patient throughout shift.  Per NP will start on thorazine this evening for sx.  Continues to be monitored on 15 minute checks for safety.  Response Patient remains isolative but making effort to get out of room with encouragement.

## 2017-01-02 NOTE — Tx Team (Signed)
Interdisciplinary Treatment and Diagnostic Plan Update  01/02/2017 Time of Session: 0830AM Zachary [name] MRN: [id number]  Principal Diagnosis: Major depressive disorder, recurrent severe without psychotic features [facility])  Secondary Diagnoses: Principal Problem:   Major depressive disorder, recurrent severe without psychotic features (HCC)   Current Medications:  Current Facility-Administered Medications  Medication Dose Route Frequency Provider Last Rate Last Dose  . acetaminophen (TYLENOL) tablet 650 mg  650 mg Oral Q4H PRN Charm Rings, NP   650 mg at 01/02/17 0725  . alum & mag hydroxide-simeth (MAALOX/MYLANTA) 200-200-20 MG/5ML suspension 30 mL  30 mL Oral Q4H PRN Charm Rings, NP      . chlordiazePOXIDE (LIBRIUM) capsule 25 mg  25 mg Oral Q6H PRN Armandina Stammer I, NP   25 mg at 01/01/17 2138  . cyclobenzaprine (FLEXERIL) tablet 7.5 mg  7.5 mg Oral BID Oneta Rack, NP   7.5 mg at 01/02/17 0725  . feeding supplement (ENSURE ENLIVE) (ENSURE ENLIVE) liquid 237 mL  237 mL Oral BID BM Cobos, Rockey Situ, MD   237 mL at 01/01/17 1550  . FLUoxetine (PROZAC) capsule 40 mg  40 mg Oral Daily Oneta Rack, NP   40 mg at 01/02/17 0724  . gabapentin (NEURONTIN) capsule 300 mg  300 mg Oral BID Charm Rings, NP   300 mg at 01/02/17 1478  . hydrOXYzine (ATARAX/VISTARIL) tablet 25 mg  25 mg Oral Q6H PRN Armandina Stammer I, NP   25 mg at 01/01/17 0904  . lidocaine (LIDODERM) 5 % 1 patch  1 patch Transdermal Q24H Oneta Rack, NP   1 patch at 01/01/17 1709  . lisinopril (PRINIVIL,ZESTRIL) tablet 20 mg  20 mg Oral Daily Charm Rings, NP   20 mg at 01/02/17 0724  . loperamide (IMODIUM) capsule 2-4 mg  2-4 mg Oral PRN Nwoko, Agnes I, NP      . magnesium hydroxide (MILK OF MAGNESIA) suspension 30 mL  30 mL Oral Daily PRN Charm Rings, NP      . multivitamin with minerals tablet 1 tablet  1 tablet Oral Daily Armandina Stammer I, NP   1 tablet at 01/02/17 0724  . ondansetron (ZOFRAN-ODT)  disintegrating tablet 4 mg  4 mg Oral Q6H PRN Nwoko, Agnes I, NP      . thiamine (B-1) injection 100 mg  100 mg Intramuscular Once Nwoko, Nicole Kindred I, NP      . thiamine (VITAMIN B-1) tablet 100 mg  100 mg Oral Daily Nwoko, Agnes I, NP   100 mg at 01/02/17 0724  . traZODone (DESYREL) tablet 50 mg  50 mg Oral QHS,MR X 1 Nira Conn A, NP   50 mg at 01/01/17 2324   PTA Medications: Prescriptions Prior to Admission  Medication Sig Dispense Refill Last Dose  . aspirin-sod bicarb-citric acid (ALKA-SELTZER) 325 MG TBEF tablet Take 325 mg by mouth every 6 (six) hours as needed (for heartburn/indigestion).    Past Week at Unknown time  . chlordiazePOXIDE (LIBRIUM) 25 MG capsule 50mg  PO TID x 1D, then 25-50mg  PO BID X 1D, then 25-50mg  PO QD X 1D (Zachary [name] not taking: Reported on 12/29/2016) 10 capsule 0 Not Taking at Unknown time  . FLUoxetine (PROZAC) 10 MG capsule Take 10 mg by mouth daily. Zachary [name] taking 20 mg daily, not directed by doctor   12/29/2016 at Unknown time  . hydrOXYzine (VISTARIL) 25 MG capsule Take 25-50 mg by mouth 3 (three) times daily as needed for anxiety. Zachary [name] says he  was taking 3 to 4 tablets every 8 hours for anxiety   Past Week at Unknown time  . lisinopril (PRINIVIL,ZESTRIL) 10 MG tablet Take 20 mg by mouth daily.   12/29/2016 at Unknown time  . Multiple Vitamin (MULTIVITAMIN) capsule Take 1 capsule by mouth daily. (Zachary [name] not taking: Reported on 08/06/2016) 30 capsule 0 Not Taking at Unknown time  . promethazine (PHENERGAN) 25 MG tablet Take 1 tablet (25 mg total) by mouth every 8 (eight) hours as needed for nausea. (Zachary [name] not taking: Reported on 12/29/2016) 12 tablet 0 Completed Course at Unknown time  . thiamine (VITAMIN B-1) 100 MG tablet Take 1 tablet (100 mg total) by mouth daily. (Zachary [name] not taking: Reported on 08/06/2016) 30 tablet 0 Completed Course at Unknown time    Zachary [name] Stressors: Financial difficulties Marital or family conflict Medication change or  noncompliance Occupational concerns Substance abuse  Zachary [name] Strengths: Ability for insight Average or above average intelligence Capable of independent living Communication skills General fund of knowledge Motivation for treatment/growth Physical Health Work skills  Treatment Modalities: Medication Management, Group therapy, Case management,  1 to 1 session with clinician, Psychoeducation, Recreational therapy.   Physician Treatment Plan for Primary Diagnosis: Major depressive disorder, recurrent severe without psychotic features (HCC) Long Term Goal(s): Improvement in symptoms so as ready for discharge Improvement in symptoms so as ready for discharge   Short Term Goals: Compliance with prescribed medications will improve Ability to identify triggers associated with substance abuse/mental health issues will improve Ability to verbalize feelings will improve Ability to disclose and discuss suicidal ideas  Medication Management: Evaluate Zachary [name]'s response, side effects, and tolerance of medication regimen.  Therapeutic Interventions: 1 to 1 sessions, Unit Group sessions and Medication administration.  Evaluation of Outcomes: Progressing  Physician Treatment Plan for Secondary Diagnosis: Principal Problem:   Major depressive disorder, recurrent severe without psychotic features (HCC)  Long Term Goal(s): Improvement in symptoms so as ready for discharge Improvement in symptoms so as ready for discharge   Short Term Goals: Compliance with prescribed medications will improve Ability to identify triggers associated with substance abuse/mental health issues will improve Ability to verbalize feelings will improve Ability to disclose and discuss suicidal ideas     Medication Management: Evaluate Zachary [name]'s response, side effects, and tolerance of medication regimen.  Therapeutic Interventions: 1 to 1 sessions, Unit Group sessions and Medication administration.  Evaluation of  Outcomes: Progressing   RN Treatment Plan for Primary Diagnosis: Major depressive disorder, recurrent severe without psychotic features (HCC) Long Term Goal(s): Knowledge of disease and therapeutic regimen to maintain health will improve  Short Term Goals: Ability to remain free from injury will improve, Ability to disclose and discuss suicidal ideas and Ability to identify and develop effective coping behaviors will improve  Medication Management: RN will administer medications as ordered by provider, will assess and evaluate Zachary [name]'s response and provide education to Zachary [name] for prescribed medication. RN will report any adverse and/or side effects to prescribing provider.  Therapeutic Interventions: 1 on 1 counseling sessions, Psychoeducation, Medication administration, Evaluate responses to treatment, Monitor vital signs and CBGs as ordered, Perform/monitor CIWA, COWS, AIMS and Fall Risk screenings as ordered, Perform wound care treatments as ordered.  Evaluation of Outcomes: Progressing   LCSW Treatment Plan for Primary Diagnosis: Major depressive disorder, recurrent severe without psychotic features (HCC) Long Term Goal(s): Safe transition to appropriate next level of care at discharge, Engage Zachary [name] in therapeutic group addressing interpersonal concerns.  Short Term Goals: Engage Zachary [name] in aftercare planning with  referrals and resources, Facilitate Zachary [name] progression through stages of change regarding substance use diagnoses and concerns and Identify triggers associated with mental health/substance abuse issues  Therapeutic Interventions: Assess for all discharge needs, 1 to 1 time with Social worker, Explore available resources and support systems, Assess for adequacy in community support network, Educate family and significant other(s) on suicide prevention, Complete Psychosocial Assessment, Interpersonal group therapy.  Evaluation of Outcomes: Progressing   Progress in  Treatment: Attending groups: Yes. Participating in groups: Yes. Taking medication as prescribed: Yes. Toleration medication: Yes. Family/Significant other contact made:SPE completed with pt; pt declined to consent to family contact.  Zachary [name] understands diagnosis: Yes. Discussing Zachary [name] identified problems/goals with staff: Yes. Medical problems stabilized or resolved: Yes. Denies suicidal/homicidal ideation: Yes. Issues/concerns per Zachary [name] self-inventory: No. Other: n/a   New problem(s) identified: No, Describe:  n/a  New Short Term/Long Term Goal(s): detox, medication management for mood stabilization, elimination of SI thoughts, development of comprehensive mental wellness/sobriety plan.   Discharge Plan or Barriers: CSW assessing for appropriate referrals. [facility] referral made and [facility] screening tentatively scheduled for Wednesday morning at 7:45AM. Pt to followup at [facility] for outpatient medication management and counseling. [facility] pamphlet and AA/NA list for [facility] provided to pt for community support.   Reason for Continuation of Hospitalization: Anxiety Depression Medication stabilization Suicidal ideation Withdrawal symptoms  Estimated Length of Stay: Wednesday, 01/04/17  Attendees: Zachary [name]: 01/02/2017 11:00 AM  Physician: Dr. Jama Flavors MD; Dr. Altamese McDade MD 01/02/2017 11:00 AM  Nursing: Jesusita Oka RN 01/02/2017 11:00 AM  RN Care Manager: Onnie Boer CM 01/02/2017 11:00 AM  Social Worker: Chartered loss adjuster, LCSW 01/02/2017 11:00 AM  Recreational Therapist: x 01/02/2017 11:00 AM  Other: Armandina Stammer NP; Hillery Jacks NP 01/02/2017 11:00 AM  Other:  01/02/2017 11:00 AM  Other: 01/02/2017 11:00 AM    Scribe for Treatment Team: Ledell Peoples Smart, LCSW 01/02/2017 11:00 AM

## 2017-01-02 NOTE — Progress Notes (Signed)
Recreation Therapy Notes  Date:  01/02/17 Time: 0930 Location: [address] Dayroom  Group Topic: Stress Management  Goal Area(s) Addresses:  Patient will verbalize importance of using healthy stress management.  Patient will identify positive emotions associated with healthy stress management.   Intervention: Stress Management  Activity :  Meditation.  LRT introduced the stress management technique of meditation.  LRT played a meditation leading patients through a body scan that allowed them to take note of the sensations they may be feeling.  Patients were to follow along as meditation played.  Education:  Stress Management, Discharge Planning.   Education Outcome: Acknowledges edcuation/In group clarification offered/Needs additional education  Clinical Observations/Feedback: Pt did not attend group.   Caroll RancherMarjette Nandana Krolikowski, LRT/CTRS         Caroll RancherLindsay, Shaunte Weissinger A 01/02/2017 11:14 AM

## 2017-01-02 NOTE — Progress Notes (Signed)
[facility]  01/02/2017 2:37 PM Zachary Peerorman [employer]  MRN:  [id number]   Subjective: Sharma Covertorman reports " I'm not good. I have bad anxiety. I got tremors on my inside. The tremors are driving me crazy. I suffer from severe anxiety . That was how I started drinking,  to self-medicate. I have had two detox [facility]. I have been through residential treatment program. Nothing is helping me. I can't eat or sleep unless I drink. I have panic attacks when the anxiety gets so bad. I feel exhausted from dealing with alcoholism. I'm tired pushing to make it day by day. I cannot be in the day room, my room, the cafeteria without getting overwhelmed with anxiety. 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 I have these tingling sensation on my feet area. I will need Residential treatment after discharge".  Objective: Sharma Covertorman is seen, chart reviewed. She is awake, alert & oriented. He is visible on the unit. Reports previous inpatient stay for alcohol detox. States he feels motivated this time to due "what is right for my grandchildren" Denies suicidal or homicidal ideation during this assessment. Denies auditory or visual hallucination and does not appear to be responding to internal stimuli. However, he is reporting severe anxiety, tremors, agitation & feeling of restlessness. Support, encouragement and reassurance was provided.   Principal Problem: Major depressive disorder, recurrent severe without psychotic features (HCC)  Diagnosis:   Patient Active Problem List   Diagnosis Date Noted  . Alcohol dependence with uncomplicated withdrawal (HCC) [F10.230] 12/30/2016  . Major depressive disorder, recurrent severe without psychotic features (HCC) [F33.2] 12/30/2016  . Chest pain [R07.9] 10/28/2015  . Hypertension [I10] 10/28/2015  . Alcohol abuse [F10.10] 10/28/2015  . Hyperglycemia [R73.9] 10/28/2015  . Alcohol withdrawal (HCC)  [F10.239] 10/28/2015  . Tobacco abuse [Z72.0] 10/28/2015  . Chest pain at rest [R07.9] 10/28/2015  . HTN (hypertension), benign [I10]    Total Time spent with patient: 15 minutes  Past Psychiatric History: Alcoholism.  Past Medical History:  Past Medical History:  Diagnosis Date  . Alcohol abuse   . Hypertension     Past Surgical History:  Procedure Laterality Date  . COLONOSCOPY    . MOUTH SURGERY    . NO PAST SURGERIES     Family History:  Family History  Problem Relation Age of Onset  . Heart attack Paternal Grandfather   . Stroke Mother   . Heart failure Mother   . Heart failure Father    Family Psychiatric  History: See H&P  Social History:  History  Alcohol Use  . Yes    Comment: daily     History  Drug Use  . Types: Marijuana    Social History   Social History  . Marital status: Divorced    Spouse name: N/A  . Number of children: N/A  . Years of education: N/A   Social History Main Topics  . Smoking status: Current Some Day Smoker    Packs/day: 2.00    Types: Cigarettes  . Smokeless tobacco: Never Used  . Alcohol use Yes     Comment: daily  . Drug use: Yes    Types: Marijuana  . Sexual activity: Not Asked   Other Topics Concern  . None   Social History Narrative  . None   Additional Social History:   Sleep: Fair  Appetite:  Fair  Current Medications: Current Facility-Administered Medications  Medication Dose  Route Frequency Provider Last Rate Last Dose  . acetaminophen (TYLENOL) tablet 650 mg  650 mg Oral Q4H PRN Charm Rings, NP   650 mg at 01/02/17 0725  . alum & mag hydroxide-simeth (MAALOX/MYLANTA) 200-200-20 MG/5ML suspension 30 mL  30 mL Oral Q4H PRN Charm Rings, NP      . cyclobenzaprine (FLEXERIL) tablet 7.5 mg  7.5 mg Oral BID Oneta Rack, NP   7.5 mg at 01/02/17 0725  . feeding supplement (ENSURE ENLIVE) (ENSURE ENLIVE) liquid 237 mL  237 mL Oral BID BM Cobos, Rockey Situ, MD   237 mL at 01/02/17 1137  .  FLUoxetine (PROZAC) capsule 40 mg  40 mg Oral Daily Oneta Rack, NP   40 mg at 01/02/17 0724  . gabapentin (NEURONTIN) capsule 300 mg  300 mg Oral BID Charm Rings, NP   300 mg at 01/02/17 6045  . lidocaine (LIDODERM) 5 % 1 patch  1 patch Transdermal Q24H Oneta Rack, NP   1 patch at 01/02/17 1138  . lisinopril (PRINIVIL,ZESTRIL) tablet 20 mg  20 mg Oral Daily Charm Rings, NP   20 mg at 01/02/17 0724  . magnesium hydroxide (MILK OF MAGNESIA) suspension 30 mL  30 mL Oral Daily PRN Charm Rings, NP      . multivitamin with minerals tablet 1 tablet  1 tablet Oral Daily Armandina Stammer I, NP   1 tablet at 01/02/17 0724  . thiamine (B-1) injection 100 mg  100 mg Intramuscular Once Nwoko, Nicole Kindred I, NP      . thiamine (VITAMIN B-1) tablet 100 mg  100 mg Oral Daily Armandina Stammer I, NP   100 mg at 01/02/17 0724  . traZODone (DESYREL) tablet 50 mg  50 mg Oral QHS,MR X 1 Nira Conn A, NP   50 mg at 01/01/17 2324   Lab Results: No results found for this or any previous visit (from the past 48 hour(s)).  Blood Alcohol level:  Lab Results  Component Value Date   ETH 75 (H) 12/29/2016   ETH 114 (H) 10/20/2016    Metabolic Disorder Labs: Lab Results  Component Value Date   HGBA1C 5.3 10/28/2015   MPG 105 10/28/2015   MPG 111 04/18/2007   No results found for: PROLACTIN Lab Results  Component Value Date   CHOL 309 (H) 10/29/2015   TRIG 153 (H) 10/29/2015   HDL 94 10/29/2015   CHOLHDL 3.3 10/29/2015   VLDL 31 10/29/2015   LDLCALC 184 (H) 10/29/2015   LDLCALC (H) 04/19/2007    142        Total Cholesterol/HDL:CHD Risk Coronary Heart Disease Risk Table                     Men   Women  1/2 Average Risk   3.4   3.3   Physical Findings: AIMS: Facial and Oral Movements Muscles of Facial Expression: None, normal Lips and Perioral Area: None, normal Jaw: None, normal Tongue: None, normal,Extremity Movements Upper (arms, wrists, hands, fingers): None, normal Lower (legs, knees,  ankles, toes): None, normal, Trunk Movements Neck, shoulders, hips: None, normal, Overall Severity Severity of abnormal movements (highest score from questions above): None, normal Incapacitation due to abnormal movements: None, normal Patient's awareness of abnormal movements (rate only patient's report): No Awareness, Dental Status Current problems with teeth and/or dentures?: No Does patient usually wear dentures?: No  CIWA:  CIWA-Ar Total: 6 COWS:     Musculoskeletal: Strength &  Muscle Tone: within normal limits Gait & Station: normal Patient leans: N/A  Psychiatric Specialty Exam: Physical Exam  Vitals reviewed. Constitutional: He appears well-developed.  GI: There is no tenderness (Continue).  Musculoskeletal: Normal range of motion.  Psychiatric: He has a normal mood and affect. His behavior is normal.    Review of Systems  Psychiatric/Behavioral: Positive for depression and substance abuse. The patient is nervous/anxious.     Blood pressure (!) 147/72, pulse 62, temperature 98.4 F (36.9 C), temperature source Oral, resp. rate (!) 22, height 5\' 10"  (1.778 m), weight 81.6 kg (180 lb).Body mass index is 25.83 kg/m.  General Appearance: Casual  Eye Contact:  Good  Speech:  Clear and Coherent  Volume:  Normal  Mood:  Anxious and Depressed  Affect:  Congruent  Thought Process:  Coherent  Orientation:  Full (Time, Place, and Person)  Thought Content:  Hallucinations: None  Suicidal Thoughts:  No  Homicidal Thoughts:  No  Memory:  Immediate;   Fair Recent;   Fair Remote;   Fair  Judgement:  Fair  Insight:  Fair  Psychomotor Activity:  Normal  Concentration:  Concentration: Fair  Recall:  [employer] of Knowledge:  Fair  Language:  Fair  Akathisia:  No  Handed:  Right  AIMS (if indicated):     Assets:  Communication Skills Desire for Improvement  ADL's:  Intact  Cognition:  WNL  Sleep:  Number of Hours: 5.75   Treatment Plan Summary: Daily contact with  patient to assess and evaluate symptoms and progress in treatment and Medication management   -Continue Flexeril 7.5 mg PO BID for Muscle ache/spams -Continue Lidoderm patch for lower back pain Q 24 hours. -Continue with Neurontin 300 mg for agitation/substance withdrawal symptoms. -Continue Prozac 40 mg for depression. -Continue with Trazodone 50 mg for insomnia -Continue on the CWIA/ Librium Protocol. -Initiated Thorazine 25 mg tid for agitation. -Will continue to monitor vitals ,medication compliance and treatment side effects while patient is here.  Reviewed labs: BAL - 75, UDS -  CSW will start working on disposition.  Patient to participate in therapeutic milieu  Sanjuana Kava, NP, PMHNP, FNP-BC. 01/02/2017, 2:37 PMPatient ID: Zachary [name], male   DOB: 05-Nov-1960, 56 y.o.   MRN: [id number]

## 2017-01-03 DIAGNOSIS — R45851 Suicidal ideations: Secondary | ICD-10-CM

## 2017-01-03 DIAGNOSIS — F1210 Cannabis abuse, uncomplicated: Secondary | ICD-10-CM

## 2017-01-03 MED ORDER — GABAPENTIN 300 MG PO CAPS: 300.0000 mg | ORAL_CAPSULE | Freq: Two times a day (BID) | ORAL | 0 refills | Status: DC

## 2017-01-03 MED ORDER — LISINOPRIL 20 MG PO TABS: 20.0000 mg | ORAL_TABLET | Freq: Every day | ORAL | 0 refills | Status: DC

## 2017-01-03 MED ORDER — FLUOXETINE HCL 40 MG PO CAPS: 40.0000 mg | ORAL_CAPSULE | Freq: Every day | ORAL | 0 refills | Status: DC

## 2017-01-03 MED ORDER — CYCLOBENZAPRINE HCL 7.5 MG PO TABS: 7.5000 mg | ORAL_TABLET | Freq: Two times a day (BID) | ORAL | 0 refills | Status: DC

## 2017-01-03 MED ORDER — TRAZODONE HCL 50 MG PO TABS: 50.0000 mg | ORAL_TABLET | Freq: Every evening | ORAL | 0 refills | Status: DC | PRN

## 2017-01-03 MED ORDER — TRAZODONE HCL 50 MG PO TABS
50.0000 mg | ORAL_TABLET | Freq: Every evening | ORAL | Status: DC | PRN
  Filled 2017-01-03: qty 14

## 2017-01-03 MED ORDER — CHLORPROMAZINE HCL 25 MG PO TABS: 25.0000 mg | ORAL_TABLET | Freq: Three times a day (TID) | ORAL | 0 refills | Status: DC

## 2017-01-03 NOTE — Progress Notes (Signed)
D: Patient continues to report withdrawal symptoms as tremors, cramping and agitation.  He does report that his symptoms have improved since yesterday.  [facility] and [facility] cab voucher.  His goal today is to "contact my sister, talk to social worker about residential treatment."  He denies any thoughts of self harm today.  He rates his depression, hopelessness and anxiety as a 7.  He is sleeping and eating fair; his energy level is low and his concentration is poor.  Patient's affect is congruent with mood.  Patient appears sad and anxious. A: Continue to monitor medication management and MD orders.  Safety checks completed every 15 minutes per protocol.  Offer support and encouragement as needed. R: Patient is receptive to staff; his behavior is appropriate.

## 2017-01-03 NOTE — [facility] Group Notes (Signed)
Pt attended spiritual care group on grief and loss facilitated by chaplain Vint Pola   Group opened with brief discussion and psycho-social ed around grief and loss in relationships and in relation to self - identifying life patterns, circumstances, changes that cause losses. Established group norm of speaking from own life experience. Group goal of establishing open and affirming space for members to share loss and experience with grief, normalize grief experience and provide psycho social education and grief support.  Group engaged in facilitated dialog around elements of grief journey.  Engaged with "four tasks of grief."    Group facilitation drew on Adlerian, narrative, and psycho-dynamic group frameworks  

## 2017-01-03 NOTE — Plan of Care (Signed)
Problem: Activity: Goal: Sleeping patterns will improve Outcome: Progressing Patient reports he has been sleeping well.

## 2017-01-03 NOTE — Progress Notes (Signed)
D: Patient endorses passive SI, contracts for safety and denies any HI or AVH.Marland Kitchen. Patient has a flat affect and depressed mood.  Pt. Slept through the early evening and awoke to take his night medication.  Pt. Forwards little but answers questions appropriately.  Pt. States that his day was "ok" as well as his sleep and appetite.  Pt. Returned to his room with steady gait, no new complaints offered.  A: Patient given emotional support from RN. Patient encouraged to come to staff with concerns and/or questions. Patient's medication routine continued. Patient's orders and plan of care reviewed. Pt. Received Thorazine this evening with no reported adverse affects.   R: Patient remains appropriate and cooperative. Will continue to monitor patient q15 minutes for safety.
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
## 2017-01-03 NOTE — Progress Notes (Signed)
  [facility] :  Will you be returning to the same living situation after discharge:  No. [facility] At discharge, do you have transportation home?: Yes,  taxi voucher in chart. PATIENT MUST DISCHARGE NO LATER THAN 7AM ON WED 01/04/17 Do you have the ability to pay for your medications: Yes,  mental health  Release of information consent forms completed and submitted to medical records by CSW.  Patient to Follow up at: Follow-up Information    [facility] Follow up.   Specialty:  Addiction Medicine Why:  Referral made: 01/02/17. Your referral is in review. If you are interested in this facility at discharge, please contact Shayla in admissions daily to check status of referral and waitlist. Thank you.  Contact information: [address] [place] [address] [phone]        [facility] Follow up.   Why:  Screening for possible admission on Wednesday 01/04/17 at 7:45AM. Please bring 14 days worth of medications/30 day prescription provided by the hospital, [place], and clothing. Thank you.   Contact information: [address] [place] [address] [phone]        [facility] Follow up.   Specialty:  Behavioral Health Why:  Walk in within 3 days of hospital/rehab discharge to be assessed for outpatient mental health services including: medication management, counseling, and group therapy. Walk in hours: Mon-Fri 8am-9am. Thank you.  Contact information: [address] [place] [address] [phone]           Next level of care provider has access to [facility] Link:no  Safety Planning and Suicide Prevention discussed: Yes,  SPE completed with pt; pt declined to consent to family contact.  Have you used any form of tobacco in the last 30 days? (Cigarettes, Smokeless Tobacco, Cigars, and/or Pipes): Yes  Has  patient been referred to the Quitline?: Patient refused referral  Patient has been referred for addiction treatment: Yes  Pulte HomesHeather N Smart, LCSW 01/03/2017, 10:26 AM

## 2017-01-03 NOTE — Progress Notes (Signed)
Reviewed AVS/transition record with patient and he indicated understanding.  Taxi voucher [facility]  Patient is to leave at 7 am in the morning.  Medications and prescriptions are attached to chart.

## 2017-01-03 NOTE — Progress Notes (Signed)
Patient did not attend AA group meeting tonight.  

## 2017-01-03 NOTE — [facility] Suicide Risk Assessment (Signed)
[facility] Discharge Suicide Risk Assessment   Principal Problem: Major depressive disorder, recurrent severe without psychotic features [facility]) Discharge Diagnoses:  Patient Active Problem List   Diagnosis Date Noted  . Alcohol dependence with uncomplicated withdrawal (HCC) [F10.230] 12/30/2016  . Major depressive disorder, recurrent severe without psychotic features (HCC) [F33.2] 12/30/2016  . Chest pain [R07.9] 10/28/2015  . Hypertension [I10] 10/28/2015  . Alcohol abuse [F10.10] 10/28/2015  . Hyperglycemia [R73.9] 10/28/2015  . Alcohol withdrawal (HCC) [F10.239] 10/28/2015  . Tobacco abuse [Z72.0] 10/28/2015  . Chest pain at rest [R07.9] 10/28/2015  . HTN (hypertension), benign [I10]     Total Time spent with patient: 30 minutes  Musculoskeletal: Strength & Muscle Tone: within normal limits Gait & Station: normal Patient leans: N/A  Psychiatric Specialty Exam: ROS  Blood pressure 136/71, pulse 75, temperature 98.1 F (36.7 C), temperature source Oral, resp. rate 18, height 5\' 10"  (1.778 m), weight 81.6 kg (180 lb).Body mass index is 25.83 kg/m.  General Appearance: Neatly dressed, pleasant, engaging well and cooperative. Appropriate behavior. Not in any distress. Good relatedness. Not internally stimulated  Eye Contact::  Good  Speech:  Spontaneous, normal prosody. Normal tone and rate.   Volume:  Normal  Mood:  Euthymic  Affect:  Full Range  Thought Process:  Goal Directed and Linear  Orientation:  Full (Time, Place, and Person)  Thought Content:  No delusional theme. No preoccupation with violent thoughts. No negative ruminations. No obsession.  No hallucination in any modality.   Suicidal Thoughts:  No  Homicidal Thoughts:  No  Memory:  Immediate;   Good Recent;   Good Remote;   Good  Judgement:  Good  Insight:  Good  Psychomotor Activity:  Normal  Concentration:  Good  Recall:  Good  Fund of Knowledge:Good  Language: Good  Akathisia:  No  Handed:    AIMS (if  indicated):     Assets:  Communication Skills Desire for Improvement  Sleep:  Number of Hours: 6.75  Cognition: WNL  ADL's:  Intact   Clinical Assessment::   Seen today. Reports that he is in good spirits. Not feeling depressed. Reports normal energy and interest. Has been maintaining normal biological functions. He is able to think clearly. He is able to focus on task. His thoughts are not crowded or racing. No evidence of mania. No hallucination in any modality. He is not making any delusional statement. No passivity of will/thought. He is fully in touch with reality. No thoughts of suicide. No thoughts of homicide. No violent thoughts. No overwhelming anxiety. Looking forward to rehab.  Nursing staff reports that patient has been appropriate on the unit. Patient has been interacting well with peers. No behavioral issues. Patient has not voiced any suicidal thoughts. Patient has not been observed to be internally stimulated. Patient has been adherent with treatment recommendations. Patient has been tolerating their medication well.   Patient was discussed at team. Team members feels that patient is back to his baseline level of function. Team agrees with plan to discharge patient today.   Demographic Factors:  Male  Loss Factors: Decline in physical health  Historical Factors: Impulsivity  Risk Reduction Factors:   Sense of responsibility to family, Positive social support, Positive therapeutic relationship and Positive coping skills or problem solving skills  Continued Clinical Symptoms:   as above   Cognitive Features That Contribute To Risk:  None    Suicide Risk:  Minimal: No identifiable suicidal ideation.  .Patient is not having any thoughts of  suicide at this time. Modifiable risk factors targeted during this admission includes depression and substance use. Demographical and historical risk factors cannot be modified. Patient is now engaging well. Patient is reliable and is  future oriented. We have buffered patient's support structures. At this point, patient is at low risk of suicide. Patient is aware of the effects of psychoactive substances on decision making process. Patient has been provided with emergency contacts. Patient acknowledges to use resources provided if unforseen circumstances changes their current risk stratification.    Follow-up Information    [facility] Follow up.   Specialty:  Addiction Medicine Why:  Referral made: 01/02/17. Your referral is in review. If you are interested in this facility at discharge, please contact Shayla in admissions daily to check status of referral and waitlist. Thank you.  Contact information: [address] [place] [address] [phone]        [facility] Follow up.   Why:  Screening for possible admission on Wednesday 01/04/17 at 7:45AM. Please bring 14 days worth of medications/30 day prescription provided by the hospital, proof [place], and clothing. Thank you.   Contact information: [address] [place] [phone]        [facility] Follow up.   Specialty:  Behavioral Health Why:  Walk in within 3 days of hospital/rehab discharge to be assessed for outpatient mental health services including: medication management, counseling, and group therapy. Walk in hours: Mon-Fri 8am-9am. Thank you.  Contact information: [address] [place] [address] [phone]           Plan Of Care/Follow-up recommendations:  1. Continue current psychotropic medications 2. Mental health and addiction follow up as arranged.  3. Provided limited quantity of prescriptions   Georgiann CockerVincent A Avedis Bevis, MD 01/03/2017, 5:58 PM

## 2017-01-03 NOTE — Progress Notes (Signed)
Recreation Therapy Notes  Animal-Assisted Activity (AAA) Program Checklist/Progress Notes Patient Eligibility Criteria Checklist & Daily Group note for Rec TxIntervention  Date: 10.23.2018 Time: 2:45pm Location: Adult Unit   AAA/T Program Assumption of Risk Form signed by Patient/ or Parent Legal Guardian Yes  Patient is free of allergies or sever asthma Yes  Patient reports no fear of animals Yes  Patient reports no history of cruelty to animals Yes  Patient understands his/her participation is voluntary Yes  Behavioral Response: Did not attend.   Marykay Lexenise L Rudolpho Claxton, LRT/CTRS        Jearl KlinefelterBlanchfield, Jameshia Hayashida L 01/03/2017 3:43 PM

## 2017-01-03 NOTE — [facility] Group Notes (Signed)
LCSW Group Therapy Note  01/03/2017 1:15pm  Type of Therapy/Topic:  Group Therapy:  Feelings about Diagnosis  Participation Level:  Active   Description of Group:   This group will allow patients to explore their thoughts and feelings about diagnoses they have received. Patients will be guided to explore their level of understanding and acceptance of these diagnoses. Facilitator will encourage patients to process their thoughts and feelings about the reactions of others to their diagnosis and will guide patients in identifying ways to discuss their diagnosis with significant others in their lives. This group will be process-oriented, with patients participating in exploration of their own experiences, giving and receiving support, and processing challenge from other group members.   Therapeutic Goals: 1. Patient will demonstrate understanding of diagnosis as evidenced by identifying two or more symptoms of the disorder 2. Patient will be able to express two feelings regarding the diagnosis 3. Patient will demonstrate their ability to communicate their needs through discussion and/or role play  Summary of Patient Progress:   Zachary [name] was attentive and engaged during today's processing group. 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 Zachary [name] states that knowing his diagnosis is enlightening for him in that his symptoms and experiences with anxiety/depression/substance abuse were explained. Zachary [name] plans to attend [facility] residential at discharge and states that he needs long term residential care and time away from society in order to focus on himself and his recovery.     Therapeutic Modalities:   Cognitive Behavioral Therapy Brief Therapy Feelings Identification    Ledell PeoplesHeather N Smart, LCSW 01/03/2017 11:39 AM

## 2017-01-04 NOTE — Progress Notes (Signed)
At evening assessment, pt was in his room sitting on the bench during the group time.  Pt says he is nervous around people and chooses to stay in his room.  He reports that [facility]  Writer talked with pt about his meds, and is aware of the meds he will receive at bedtime.  He denies SI/HI/AVH.  He denies having any withdrawal symptoms. His primary complaint is his anxiety.  He is pleasant and cooperative with Clinical research associatewriter.  He came to the med window for his meds.  Support and encouragement offered.  Discharge plans are in process.  Pt will leave by taxi in the morning before 0700 to [facility].  Safety maintained with q15 minute checks.

## 2017-01-04 NOTE — Progress Notes (Signed)
Pt was discharged this morning to [facility].  Pt reports he is ready for this next step in his recovery.  He voiced no issues or concerns.  Discharge instructions were reviewed with the patient.  All items from locker #10 were returned to pt.  Sample meds were given to patient.  All paperwork was signed, and pt voiced understanding.  Pt was safe at the time of discharge.

## 2017-03-09 ENCOUNTER — Telehealth (HOSPITAL_COMMUNITY): Payer: Self-pay

## 2017-07-08 ENCOUNTER — Observation Stay (HOSPITAL_COMMUNITY)
Admission: EM | Admit: 2017-07-08 | Discharge: 2017-07-11 | Disposition: A | Discharge status: Home / Self Care | Payer: Self-pay | Attending: Internal Medicine | Admitting: Internal Medicine

## 2017-07-08 ENCOUNTER — Other Ambulatory Visit: Payer: Self-pay

## 2017-07-08 ENCOUNTER — Encounter (HOSPITAL_COMMUNITY): Payer: Self-pay | Admitting: Internal Medicine

## 2017-07-08 DIAGNOSIS — I10 Essential (primary) hypertension: Secondary | ICD-10-CM | POA: Diagnosis present

## 2017-07-08 DIAGNOSIS — F332 Major depressive disorder, recurrent severe without psychotic features: Secondary | ICD-10-CM

## 2017-07-08 DIAGNOSIS — R45851 Suicidal ideations: Secondary | ICD-10-CM

## 2017-07-08 DIAGNOSIS — Y929 Unspecified place or not applicable: Secondary | ICD-10-CM | POA: Insufficient documentation

## 2017-07-08 DIAGNOSIS — K921 Melena: Secondary | ICD-10-CM | POA: Insufficient documentation

## 2017-07-08 DIAGNOSIS — Z79899 Other long term (current) drug therapy: Secondary | ICD-10-CM | POA: Insufficient documentation

## 2017-07-08 DIAGNOSIS — Z880 Allergy status to penicillin: Secondary | ICD-10-CM | POA: Insufficient documentation

## 2017-07-08 DIAGNOSIS — T50901A Poisoning by unspecified drugs, medicaments and biological substances, accidental (unintentional), initial encounter: Secondary | ICD-10-CM | POA: Diagnosis present

## 2017-07-08 DIAGNOSIS — R9431 Abnormal electrocardiogram [ECG] [EKG]: Secondary | ICD-10-CM

## 2017-07-08 DIAGNOSIS — Z8249 Family history of ischemic heart disease and other diseases of the circulatory system: Secondary | ICD-10-CM | POA: Insufficient documentation

## 2017-07-08 DIAGNOSIS — F10129 Alcohol abuse with intoxication, unspecified: Secondary | ICD-10-CM | POA: Insufficient documentation

## 2017-07-08 DIAGNOSIS — F1010 Alcohol abuse, uncomplicated: Secondary | ICD-10-CM | POA: Diagnosis present

## 2017-07-08 DIAGNOSIS — F4321 Adjustment disorder with depressed mood: Secondary | ICD-10-CM

## 2017-07-08 DIAGNOSIS — F419 Anxiety disorder, unspecified: Secondary | ICD-10-CM | POA: Insufficient documentation

## 2017-07-08 DIAGNOSIS — F17210 Nicotine dependence, cigarettes, uncomplicated: Secondary | ICD-10-CM | POA: Insufficient documentation

## 2017-07-08 DIAGNOSIS — F10929 Alcohol use, unspecified with intoxication, unspecified: Secondary | ICD-10-CM

## 2017-07-08 DIAGNOSIS — I4510 Unspecified right bundle-branch block: Secondary | ICD-10-CM | POA: Insufficient documentation

## 2017-07-08 DIAGNOSIS — K922 Gastrointestinal hemorrhage, unspecified: Secondary | ICD-10-CM | POA: Diagnosis present

## 2017-07-08 DIAGNOSIS — F1910 Other psychoactive substance abuse, uncomplicated: Secondary | ICD-10-CM

## 2017-07-08 DIAGNOSIS — Z888 Allergy status to other drugs, medicaments and biological substances status: Secondary | ICD-10-CM | POA: Insufficient documentation

## 2017-07-08 DIAGNOSIS — F32A Depression, unspecified: Secondary | ICD-10-CM | POA: Diagnosis present

## 2017-07-08 DIAGNOSIS — I4581 Long QT syndrome: Secondary | ICD-10-CM | POA: Insufficient documentation

## 2017-07-08 DIAGNOSIS — R197 Diarrhea, unspecified: Secondary | ICD-10-CM | POA: Insufficient documentation

## 2017-07-08 DIAGNOSIS — F329 Major depressive disorder, single episode, unspecified: Secondary | ICD-10-CM | POA: Diagnosis present

## 2017-07-08 DIAGNOSIS — T50904A Poisoning by unspecified drugs, medicaments and biological substances, undetermined, initial encounter: Secondary | ICD-10-CM

## 2017-07-08 DIAGNOSIS — T43212A Poisoning by selective serotonin and norepinephrine reuptake inhibitors, intentional self-harm, initial encounter: Principal | ICD-10-CM | POA: Insufficient documentation

## 2017-07-08 DIAGNOSIS — T50902A Poisoning by unspecified drugs, medicaments and biological substances, intentional self-harm, initial encounter: Secondary | ICD-10-CM | POA: Diagnosis present

## 2017-07-08 LAB — RAPID URINE DRUG SCREEN, HOSP PERFORMED
AMPHETAMINES: POSITIVE — AB
Barbiturates: NOT DETECTED
Benzodiazepines: NOT DETECTED
Cocaine: NOT DETECTED
OPIATES: NOT DETECTED
Tetrahydrocannabinol: POSITIVE — AB

## 2017-07-08 LAB — CBC
HCT: 44.6 % (ref 39.0–52.0)
Hemoglobin: 16.6 g/dL (ref 13.0–17.0)
MCH: 33 pg (ref 26.0–34.0)
MCHC: 36.6 g/dL — ABNORMAL HIGH (ref 30.0–36.0)
MCV: 88.7 fL (ref 78.0–100.0)
Platelets: 187 10*3/uL (ref 150–400)
RBC: 5.03 MIL/uL (ref 4.22–5.81)
RDW: 12.9 % (ref 11.5–15.5)
WBC: 7.3 10*3/uL (ref 4.0–10.5)

## 2017-07-08 LAB — SALICYLATE LEVEL

## 2017-07-08 LAB — COMPREHENSIVE METABOLIC PANEL
ALBUMIN: 4 g/dL (ref 3.5–5.0)
ALT: 37 U/L (ref 17–63)
ANION GAP: 14 (ref 5–15)
AST: 29 U/L (ref 15–41)
Alkaline Phosphatase: 75 U/L (ref 38–126)
BUN: 10 mg/dL (ref 6–20)
CHLORIDE: 101 mmol/L (ref 101–111)
CO2: 23 mmol/L (ref 22–32)
Calcium: 9.1 mg/dL (ref 8.9–10.3)
Creatinine, Ser: 0.72 mg/dL (ref 0.61–1.24)
GFR calc Af Amer: >60 mL/min (ref >60)
GFR calc non Af Amer: >60 mL/min (ref >60)
GLUCOSE: 116 mg/dL — AB (ref 65–99)
POTASSIUM: 3.4 mmol/L — AB (ref 3.5–5.1)
SODIUM: 138 mmol/L (ref 135–145)
TOTAL PROTEIN: 7.1 g/dL (ref 6.5–8.1)
Total Bilirubin: 0.7 mg/dL (ref 0.3–1.2)

## 2017-07-08 LAB — POC OCCULT BLOOD, ED: Fecal Occult Bld: POSITIVE — AB

## 2017-07-08 LAB — ACETAMINOPHEN LEVEL: Acetaminophen (Tylenol), Serum: <10 ug/mL — ABNORMAL LOW (ref 10–30)

## 2017-07-08 LAB — TROPONIN I: Troponin I: <0.03 ng/mL (ref <0.03)

## 2017-07-08 LAB — ETHANOL: Alcohol, Ethyl (B): 44 mg/dL — ABNORMAL HIGH (ref <10)

## 2017-07-08 MED ORDER — LORAZEPAM 1 MG PO TABS
1.0000 mg | ORAL_TABLET | Freq: Four times a day (QID) | ORAL | Status: DC | PRN
  Administered 2017-07-11: 1 mg via ORAL
  Filled 2017-07-08: qty 1

## 2017-07-08 MED ORDER — POTASSIUM CHLORIDE IN NACL 20-0.9 MEQ/L-% IV SOLN
INTRAVENOUS | Status: AC
  Administered 2017-07-08 – 2017-07-09 (×2): via INTRAVENOUS
  Filled 2017-07-08 (×4): qty 1000

## 2017-07-08 MED ORDER — VITAMIN B-1 100 MG PO TABS: 100.0000 mg | ORAL_TABLET | Freq: Every day | ORAL | Status: DC

## 2017-07-08 MED ORDER — ADULT MULTIVITAMIN W/MINERALS CH
1.0000 | ORAL_TABLET | Freq: Every day | ORAL | Status: DC
  Administered 2017-07-09 – 2017-07-11 (×3): 1 via ORAL
  Filled 2017-07-08 (×3): qty 1

## 2017-07-08 MED ORDER — ADULT MULTIVITAMIN W/MINERALS CH
1.0000 | ORAL_TABLET | Freq: Once | ORAL | Status: AC
  Administered 2017-07-08: 1 via ORAL
  Filled 2017-07-08: qty 1

## 2017-07-08 MED ORDER — LORAZEPAM 2 MG/ML IJ SOLN
0.0000 mg | Freq: Four times a day (QID) | INTRAMUSCULAR | Status: AC
  Administered 2017-07-08: 2 mg via INTRAVENOUS
  Administered 2017-07-09: 1 mg via INTRAVENOUS
  Administered 2017-07-09 – 2017-07-10 (×2): 2 mg via INTRAVENOUS
  Administered 2017-07-10: 1 mg via INTRAVENOUS
  Filled 2017-07-08 (×5): qty 1

## 2017-07-08 MED ORDER — MAGNESIUM SULFATE 2 GM/50ML IV SOLN
2.0000 g | INTRAVENOUS | Status: AC
  Administered 2017-07-08: 2 g via INTRAVENOUS
  Filled 2017-07-08: qty 50

## 2017-07-08 MED ORDER — SODIUM CHLORIDE 0.9 % IV SOLN
8.0000 mg/h | INTRAVENOUS | Status: DC
  Administered 2017-07-08: 8 mg/h via INTRAVENOUS
  Filled 2017-07-08 (×2): qty 80

## 2017-07-08 MED ORDER — HYDRALAZINE HCL 20 MG/ML IJ SOLN
5.0000 mg | INTRAMUSCULAR | Status: DC | PRN
  Administered 2017-07-09 – 2017-07-11 (×3): 5 mg via INTRAVENOUS
  Filled 2017-07-08 (×3): qty 1

## 2017-07-08 MED ORDER — LORAZEPAM 2 MG/ML IJ SOLN: 0.0000 mg | Freq: Two times a day (BID) | INTRAMUSCULAR | Status: DC

## 2017-07-08 MED ORDER — ACETAMINOPHEN 325 MG PO TABS: 650.0000 mg | ORAL_TABLET | ORAL | Status: DC | PRN

## 2017-07-08 MED ORDER — LORAZEPAM 1 MG PO TABS: 0.0000 mg | ORAL_TABLET | Freq: Two times a day (BID) | ORAL | Status: DC

## 2017-07-08 MED ORDER — LORAZEPAM 1 MG PO TABS: 0.0000 mg | ORAL_TABLET | Freq: Four times a day (QID) | ORAL | Status: DC

## 2017-07-08 MED ORDER — THIAMINE HCL 100 MG/ML IJ SOLN
100.0000 mg | Freq: Every day | INTRAMUSCULAR | Status: DC
  Administered 2017-07-08: 100 mg via INTRAVENOUS

## 2017-07-08 MED ORDER — METOCLOPRAMIDE HCL 5 MG/ML IJ SOLN
10.0000 mg | Freq: Once | INTRAMUSCULAR | Status: AC
  Administered 2017-07-08: 10 mg via INTRAVENOUS
  Filled 2017-07-08: qty 2

## 2017-07-08 MED ORDER — THIAMINE HCL 100 MG/ML IJ SOLN
100.0000 mg | Freq: Every day | INTRAMUSCULAR | Status: DC
  Administered 2017-07-10: 100 mg via INTRAVENOUS
  Filled 2017-07-08: qty 2

## 2017-07-08 MED ORDER — THIAMINE HCL 100 MG/ML IJ SOLN
100.0000 mg | Freq: Once | INTRAMUSCULAR | Status: DC
  Filled 2017-07-08: qty 2

## 2017-07-08 MED ORDER — PANTOPRAZOLE SODIUM 40 MG IV SOLR: 40.0000 mg | Freq: Two times a day (BID) | INTRAVENOUS | Status: DC

## 2017-07-08 MED ORDER — ALUM & MAG HYDROXIDE-SIMETH 200-200-20 MG/5ML PO SUSP: 30.0000 mL | Freq: Four times a day (QID) | ORAL | Status: DC | PRN

## 2017-07-08 MED ORDER — VITAMIN B-1 100 MG PO TABS
100.0000 mg | ORAL_TABLET | Freq: Every day | ORAL | Status: DC
  Administered 2017-07-09 – 2017-07-11 (×2): 100 mg via ORAL
  Filled 2017-07-08 (×3): qty 1

## 2017-07-08 MED ORDER — LORAZEPAM 2 MG/ML IJ SOLN
0.0000 mg | Freq: Two times a day (BID) | INTRAMUSCULAR | Status: DC
  Administered 2017-07-10: 1 mg via INTRAVENOUS
  Filled 2017-07-08: qty 1

## 2017-07-08 MED ORDER — LORAZEPAM 2 MG/ML IJ SOLN
0.0000 mg | Freq: Four times a day (QID) | INTRAMUSCULAR | Status: DC
  Administered 2017-07-08: 2 mg via INTRAVENOUS
  Filled 2017-07-08: qty 1

## 2017-07-08 MED ORDER — ACETAMINOPHEN 650 MG RE SUPP: 650.0000 mg | Freq: Four times a day (QID) | RECTAL | Status: DC | PRN

## 2017-07-08 MED ORDER — ACETAMINOPHEN 325 MG PO TABS: 650.0000 mg | ORAL_TABLET | Freq: Four times a day (QID) | ORAL | Status: DC | PRN

## 2017-07-08 MED ORDER — SODIUM CHLORIDE 0.9 % IV SOLN
80.0000 mg | Freq: Once | INTRAVENOUS | Status: AC
  Administered 2017-07-08: 80 mg via INTRAVENOUS
  Filled 2017-07-08: qty 80

## 2017-07-08 MED ORDER — POTASSIUM CHLORIDE CRYS ER 20 MEQ PO TBCR
40.0000 meq | EXTENDED_RELEASE_TABLET | Freq: Once | ORAL | Status: AC
  Administered 2017-07-08: 40 meq via ORAL
  Filled 2017-07-08: qty 2

## 2017-07-08 MED ORDER — LORAZEPAM 2 MG/ML IJ SOLN
1.0000 mg | Freq: Four times a day (QID) | INTRAMUSCULAR | Status: DC | PRN
  Administered 2017-07-10: 1 mg via INTRAVENOUS
  Filled 2017-07-08: qty 1

## 2017-07-08 MED ORDER — FOLIC ACID 1 MG PO TABS
1.0000 mg | ORAL_TABLET | Freq: Every day | ORAL | Status: DC
  Administered 2017-07-09 – 2017-07-11 (×3): 1 mg via ORAL
  Filled 2017-07-08 (×3): qty 1

## 2017-07-08 NOTE — ED Notes (Addendum)
Revonda Standard with Poison Control called to update staff prior to arrival of pt. Reports pt took 15 - 100 mg Trazodone and has been drinking vodka over a period of 12 hours.with intent to harm self. Observe for prolonged QT if long check potassium and Mag. Observe for CNS Depression, Seizures obtain Tylenol and ASA level. Observe for 6 hours if asymptomatic and normal VS.

## 2017-07-08 NOTE — Progress Notes (Signed)
TTS consulted with Nira Conn, NP who recommends inpt treatment. EDP [name], MD has been advised and states the pt will be transferred to the medical floor. TTS to seek inpt treatment pending medical clearance.  Princess Bruins, MSW, LCSW Therapeutic Triage Specialist  [phone]

## 2017-07-08 NOTE — ED Notes (Signed)
ED TO INPATIENT HANDOFF REPORT  Name/Age/Gender Zachary [name] 57 y.o. male  Code Status    Code Status Orders  (From admission, onward)        Start     Ordered   07/08/17 1744  Full code  Continuous     07/08/17 1744    Code Status History    Date Active Date Inactive Code Status Order ID Comments User Context   12/30/2016 1247 01/04/2017 1013 Full Code [id number]  Patrecia Pour, NP Inpatient   12/30/2016 1246 12/30/2016 1247 Full Code [id number]  Patrecia Pour, NP Inpatient   12/29/2016 1608 12/30/2016 1205 Full Code [id number]  Frederica Kuster, PA-C ED   08/07/2016 0251 08/07/2016 1620 Full Code [id number]  Frederica Kuster, PA-C ED   10/28/2015 2243 10/30/2015 2057 Full Code [id number]  Vianne Bulls, MD Inpatient      Home/SNF/Other Home  Chief Complaint Overdose  Level of Care/Admitting Diagnosis ED Disposition    ED Disposition Condition [place]: [place] [100102]  Level of Care: Stepdown [14]  Admit to SDU based on following criteria: Severe physiological/psychological symptoms:  Any diagnosis requiring assessment & intervention at least every 4 hours on an ongoing basis to obtain desired patient outcomes including stability and rehabilitation  Diagnosis: Drug overdose [938101]  Admitting Physician: Rise Patience [phone]  Attending Physician: Rise Patience [phone]  PT Class (Do Not Modify): Observation [104]  PT Acc Code (Do Not Modify): Observation [10022]       Medical History Past Medical History:  Diagnosis Date  . Alcohol abuse   . Hypertension     Allergies Allergies  Allergen Reactions  . Penicillins Other (See Comments)    Childhood allergy Reaction:  Unknown  Has patient had a PCN reaction causing immediate rash, facial/tongue/throat swelling, SOB or lightheadedness with hypotension: Unsure Has patient had a PCN reaction causing severe rash involving mucus membranes or skin  necrosis: Unsure Has patient had a PCN reaction that required hospitalization Unsure Has patient had a PCN reaction occurring within the last 10 years: No If all of the above answers are "NO", then may proceed with Cephalosporin use.  . Statins Palpitations    IV Location/Drains/Wounds Patient Lines/Drains/Airways Status   Active Line/Drains/Airways    Name:   Placement date:   Placement time:   Site:   Days:   Peripheral IV 07/08/17 Left Wrist   07/08/17    1727    Wrist   less than 1          Labs/Imaging Results for orders placed or performed during the hospital encounter of 07/08/17 (from the past 48 hour(s))  Rapid urine drug screen (hospital performed)     Status: Abnormal   Collection Time: 07/08/17  5:36 PM  Result Value Ref Range   Opiates NONE DETECTED NONE DETECTED   Cocaine NONE DETECTED NONE DETECTED   Benzodiazepines NONE DETECTED NONE DETECTED   Amphetamines POSITIVE (A) NONE DETECTED   Tetrahydrocannabinol POSITIVE (A) NONE DETECTED   Barbiturates NONE DETECTED NONE DETECTED    Comment: (NOTE) DRUG SCREEN FOR MEDICAL PURPOSES ONLY.  IF CONFIRMATION IS NEEDED FOR ANY PURPOSE, NOTIFY LAB WITHIN 5 DAYS. LOWEST DETECTABLE LIMITS FOR URINE DRUG SCREEN Drug Class                     Cutoff (ng/mL) Amphetamine and metabolites    1000 Barbiturate and metabolites  200 Benzodiazepine                 950 Tricyclics and metabolites     300 Opiates and metabolites        300 Cocaine and metabolites        300 THC                            50 Performed at [facility], [place] [address] [place], [place] [address]   Comprehensive metabolic panel     Status: Abnormal   Collection Time: 07/08/17  5:40 PM  Result Value Ref Range   Sodium 138 135 - 145 mmol/L   Potassium 3.4 (L) 3.5 - 5.1 mmol/L   Chloride 101 101 - 111 mmol/L   CO2 23 22 - 32 mmol/L   Glucose, Bld 116 (H) 65 - 99 mg/dL   BUN 10 6 - 20 mg/dL   Creatinine, Ser 0.72 0.61 - 1.24  mg/dL   Calcium 9.1 8.9 - 10.3 mg/dL   Total Protein 7.1 6.5 - 8.1 g/dL   Albumin 4.0 3.5 - 5.0 g/dL   AST 29 15 - 41 U/L   ALT 37 17 - 63 U/L   Alkaline Phosphatase 75 38 - 126 U/L   Total Bilirubin 0.7 0.3 - 1.2 mg/dL   GFR calc non Af Amer >60 >60 mL/min   GFR calc Af Amer >60 >60 mL/min    Comment: (NOTE) The eGFR has been calculated using the CKD EPI equation. This calculation has not been validated in all clinical situations. eGFR's persistently <60 mL/min signify possible Chronic Kidney Disease.    Anion gap 14 5 - 15    Comment: Performed at [facility], [place] [address], [place], [place] [address]  Ethanol     Status: Abnormal   Collection Time: 07/08/17  5:40 PM  Result Value Ref Range   Alcohol, Ethyl (B) 44 (H) <10 mg/dL    Comment:        LOWEST DETECTABLE LIMIT FOR SERUM ALCOHOL IS 10 mg/dL FOR MEDICAL PURPOSES ONLY Performed at [facility] [place] [address] [place], [place] [address]   cbc     Status: Abnormal   Collection Time: 07/08/17  5:40 PM  Result Value Ref Range   WBC 7.3 4.0 - 10.5 K/uL   RBC 5.03 4.22 - 5.81 MIL/uL   Hemoglobin 16.6 13.0 - 17.0 g/dL    Comment: REPEATED TO VERIFY CORRECTED FOR COLD AGGLUTININS    HCT 44.6 39.0 - 52.0 %   MCV 88.7 78.0 - 100.0 fL   MCH 33.0 26.0 - 34.0 pg    Comment: REPEATED TO VERIFY CORRECTED FOR COLD AGGLUTININS    MCHC 36.6 (H) 30.0 - 36.0 g/dL    Comment: REPEATED TO VERIFY CORRECTED FOR COLD AGGLUTININS    RDW 12.9 11.5 - 15.5 %   Platelets 187 150 - 400 K/uL    Comment: Performed at [facility], [place] [address] [place], [place] [address]  Acetaminophen level     Status: Abnormal   Collection Time: 07/08/17  5:40 PM  Result Value Ref Range   Acetaminophen (Tylenol), Serum <10 (L) 10 - 30 ug/mL    Comment:        THERAPEUTIC CONCENTRATIONS VARY SIGNIFICANTLY. A RANGE OF 10-30 ug/mL MAY BE AN EFFECTIVE CONCENTRATION FOR MANY  PATIENTS. HOWEVER, SOME ARE BEST TREATED AT CONCENTRATIONS OUTSIDE THIS  RANGE. ACETAMINOPHEN CONCENTRATIONS >150 ug/mL AT 4 HOURS AFTER INGESTION AND >50 ug/mL AT 12 HOURS AFTER INGESTION ARE OFTEN ASSOCIATED WITH TOXIC REACTIONS. Performed at [facility], [place] [address], [place], [place] [address]   Salicylate level     Status: None   Collection Time: 07/08/17  5:40 PM  Result Value Ref Range   Salicylate Lvl <8.4 2.8 - 30.0 mg/dL    Comment: Performed at [facility], [place] [address], [place], [place] [address]   No results found.  Pending Labs Unresulted Labs (From admission, onward)   Start     Ordered   07/08/17 1744  Occult blood card to lab, stool RN will collect  STAT,   STAT    Question:  Specimen to be collected by?  Answer:  RN will collect   07/08/17 1743      Vitals/Pain Today's Vitals   07/08/17 1900 07/08/17 1930 07/08/17 2000 07/08/17 2030  BP: 139/85 (!) 153/105 (!) 161/87 (!) 153/90  Pulse: 82 83 84 82  Resp: 14 19 17 20   SpO2: 92% 92% 93% 94%  Weight:      Height:      PainSc:        Isolation Precautions No active isolations  Medications Medications  acetaminophen (TYLENOL) tablet 650 mg (has no administration in time range)  alum & mag hydroxide-simeth (MAALOX/MYLANTA) 200-200-20 MG/5ML suspension 30 mL (has no administration in time range)  LORazepam (ATIVAN) injection 0-4 mg (2 mg Intravenous Given 07/08/17 1800)    Or  LORazepam (ATIVAN) tablet 0-4 mg ( Oral See Alternative 07/08/17 1800)  LORazepam (ATIVAN) injection 0-4 mg (has no administration in time range)    Or  LORazepam (ATIVAN) tablet 0-4 mg (has no administration in time range)  thiamine (B-1) injection 100 mg (100 mg Intravenous Given 07/08/17 1758)  thiamine (B-1) injection 100 mg ( Intravenous Canceled Entry 07/08/17 1857)  metoCLOPramide (REGLAN) injection 10 mg (10 mg Intravenous Given 07/08/17 1757)  magnesium sulfate IVPB 2 g 50 mL (0 g  Intravenous Stopped 07/08/17 1902)  multivitamin with minerals tablet 1 tablet (1 tablet Oral Given 07/08/17 1756)    Mobility walks

## 2017-07-08 NOTE — ED Notes (Signed)
Pt denies wanting to harm self here, "I feel safe here"

## 2017-07-08 NOTE — ED Notes (Signed)
Urine culture sent to lab with UA. 
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
## 2017-07-08 NOTE — ED Triage Notes (Signed)
Pt to ED via [facility] with c/o of SI and drug overdose on trazadone. Pt had not been eating or drinking for the past 10 days, bo sleep for the past 4 days, with complaints of SI which caused him to relapse with drinking and taking the trazadone. Pt also has c/o of distended abdomen and black tarry stools.   Last vitals

## 2017-07-08 NOTE — ED Provider Notes (Signed)
[facility]-EMERGENCY DEPT Provider Note   CSN: [id number] Arrival date & time: 07/08/17  1718     History   Chief Complaint Chief Complaint  Patient presents with  . Drug Overdose  . Alcohol Problem    HPI Zachary [name] is a 57 y.o. male.  HPI  The patient is a 57 year old male, he has a known history of alcohol abuse, hypertension, major depressive disorder and a history of severe anxiety.  The patient reports to me that he has not been able to sleep in several days and has not been able to eat in over 10 days since he went on an alcohol binge over the Easter weekend.  Since that time the patient has had significant amounts of alcohol daily to the point where he feels like when he stops drinking he has tremors and gets extremely nauseated.  Over the last 12 hours the patient has had over 15 doses of his trazodone, 100 mg tablets which she is taking either 1, 2 or 3 tablets at a time trying to fall asleep.  This is without any success and finally the patient decided to bring himself to the hospital because he was feeling so poorly.  He reports that this was not a suicide attempt though he states that he has had some passive suicidal thoughts recently.  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  The patient reports that he has not taken his fluoxetine or his Risperdal today, yesterday he did notice that he was having increased anxiety and was having some auditory and visual hallucinations, these are voices, nonspecific, noncommand.  Physically the patient complains of nausea epigastric discomfort and is now starting to feel more tired.  Paramedics were called by the patient and felt like he could not make it to the hospital driving himself.  They found him to be in a right bundle branch block, his blood sugar was normal, the patient's mental status has been normal and there was  no seizures witnessed prehospital.  Poison control was contacted in the prehospital setting and made the recommendations.  Medical record was reviewed and shows that the patient had been treated recently through the behavioral health system as recently as October of last year, he was also seen in September and August of that same year for his alcohol abuse.  He has had multiple visits to the emergency department for alcohol intoxication.  Past Medical History:  Diagnosis Date  . Alcohol abuse   . Hypertension     Patient Active Problem List   Diagnosis Date Noted  . Alcohol dependence with uncomplicated withdrawal (HCC) 12/30/2016  . Major depressive disorder, recurrent severe without psychotic features (HCC) 12/30/2016  . Chest pain 10/28/2015  . Hypertension 10/28/2015  . Alcohol abuse 10/28/2015  . Hyperglycemia 10/28/2015  . Alcohol withdrawal (HCC) 10/28/2015  . Tobacco abuse 10/28/2015  . Chest pain at rest 10/28/2015  . HTN (hypertension), benign     Past Surgical History:  Procedure Laterality Date  . COLONOSCOPY    . MOUTH SURGERY    . NO PAST SURGERIES          Home Medications    Prior to Admission medications   Medication Sig Start Date End Date Taking? Authorizing Provider  aspirin-sod bicarb-citric acid (ALKA-SELTZER) 325 MG TBEF tablet Take 155 mg by mouth 2 (two) times daily as needed (acid reflux,headache).   Yes [provider]  FLUoxetine (PROZAC) 20 MG capsule Take 60 mg by mouth every morning. 06/18/17  Yes [provider]  FLUoxetine (PROZAC) 40 MG capsule Take 1 capsule (40 mg total) by mouth daily. For mood control 01/04/17  Yes Money, Gerlene Burdock, FNP  gabapentin (NEURONTIN) 300 MG capsule Take 1 capsule (300 mg total) by mouth 2 (two) times daily. For agitation 01/03/17  Yes Money, Gerlene Burdock, FNP  risperiDONE (RISPERDAL) 0.5 MG tablet Take 0.5 mg by mouth 2 (two) times daily. 06/18/17  Yes [provider]  traZODone (DESYREL)  100 MG tablet Take 100 mg by mouth at bedtime as needed for sleep. 06/10/17  Yes [provider]  chlorproMAZINE (THORAZINE) 25 MG tablet Take 1 tablet (25 mg total) by mouth 3 (three) times daily. For mood control Patient not taking: Reported on 07/08/2017 01/03/17   Money, Gerlene Burdock, FNP  lisinopril (PRINIVIL,ZESTRIL) 20 MG tablet Take 1 tablet (20 mg total) by mouth daily. For high blood pressure Patient not taking: Reported on 07/08/2017 01/04/17   Money, Gerlene Burdock, FNP    Family History Family History  Problem Relation Age of Onset  . Heart attack Paternal Grandfather   . Stroke Mother   . Heart failure Mother   . Heart failure Father     Social History Social History   Tobacco Use  . Smoking status: Current Some Day Smoker    Packs/day: 2.00    Types: Cigarettes  . Smokeless tobacco: Never Used  Substance Use Topics  . Alcohol use: Yes    Comment: daily  . Drug use: Yes    Types: Marijuana     Allergies   Penicillins and Statins   Review of Systems Review of Systems  All other systems reviewed and are negative.    Physical Exam Updated Vital Signs BP (!) 154/91 (BP Location: Right Arm)   Pulse 84   Resp 18   Ht  (1.803 m)   Wt 86.2 kg (190 lb)   SpO2 93%   BMI 26.50 kg/m   Physical Exam  Constitutional: He appears well-developed and well-nourished. No distress.  HENT:  Head: Normocephalic and atraumatic.  Mouth/Throat: Oropharynx is clear and moist. No oropharyngeal exudate.  Eyes: Pupils are equal, round, and reactive to light. Conjunctivae and EOM are normal. Right eye exhibits no discharge. Left eye exhibits no discharge. No scleral icterus.  Neck: Normal range of motion. Neck supple. No JVD present. No thyromegaly present.  Cardiovascular: Normal rate, regular rhythm, normal heart sounds and intact distal pulses. Exam reveals no gallop and no friction rub.  No murmur heard. Pulmonary/Chest: Effort normal and breath sounds normal. No  respiratory distress. He has no wheezes. He has no rales.  Abdominal: Soft. Bowel sounds are normal. He exhibits no distension and no mass. There is no tenderness.  Musculoskeletal: Normal range of motion. He exhibits no edema or tenderness.  Lymphadenopathy:    He has no cervical adenopathy.  Neurological: He is alert. Coordination normal.  Skin: Skin is warm and dry. No rash noted. No erythema.  Psychiatric: He has a normal mood and affect. His behavior is normal.  Nursing note and vitals reviewed.    ED Treatments / Results  Labs (all labs ordered are listed, but only abnormal results are displayed) Labs Reviewed  COMPREHENSIVE METABOLIC PANEL - Abnormal; Notable for the following components:      Result Value   Potassium 3.4 (*)    Glucose, Bld 116 (*)  All other components within normal limits  ETHANOL - Abnormal; Notable for the following components:   Alcohol, Ethyl (B) 44 (*)    All other components within normal limits  CBC - Abnormal; Notable for the following components:   MCHC 36.6 (*)    All other components within normal limits  RAPID URINE DRUG SCREEN, HOSP PERFORMED - Abnormal; Notable for the following components:   Amphetamines POSITIVE (*)    Tetrahydrocannabinol POSITIVE (*)    All other components within normal limits  ACETAMINOPHEN LEVEL - Abnormal; Notable for the following components:   Acetaminophen (Tylenol), Serum <10 (*)    All other components within normal limits  SALICYLATE LEVEL  OCCULT BLOOD X 1 CARD TO LAB, STOOL    EKG EKG Interpretation  Date/Time:  Saturday July 08 2017 17:28:20 EDT Ventricular Rate:  88 PR Interval:    QRS Duration: 104 QT Interval:  478 QTC Calculation: 579 R Axis:   -55 Text Interpretation:  Sinus rhythm Incomplete RBBB and LAFB Prolonged QT interval Since last tracing QT has lengthened Abnormal ekg Confirmed by [name] ([id number]) on 07/08/2017 5:31:37 PM   Radiology No results  found.  Procedures .Critical Care Performed by: [name], MD Authorized by: [name], MD   Critical care provider statement:    Critical care time (minutes):  35   Critical care time was exclusive of:  Separately billable procedures and treating other patients and teaching time   Critical care was necessary to treat or prevent imminent or life-threatening deterioration of the following conditions:  Toxidrome   Critical care was time spent personally by me on the following activities:  Blood draw for specimens, development of treatment plan with patient or surrogate, discussions with consultants, evaluation of patient's response to treatment, examination of patient, obtaining history from patient or surrogate, ordering and performing treatments and interventions, ordering and review of laboratory studies, ordering and review of radiographic studies, pulse oximetry, re-evaluation of patient's condition and review of old charts   (including critical care time)  Medications Ordered in ED Medications  acetaminophen (TYLENOL) tablet 650 mg (has no administration in time range)  alum & mag hydroxide-simeth (MAALOX/MYLANTA) 200-200-20 MG/5ML suspension 30 mL (has no administration in time range)  LORazepam (ATIVAN) injection 0-4 mg (2 mg Intravenous Given 07/08/17 1800)    Or  LORazepam (ATIVAN) tablet 0-4 mg ( Oral See Alternative 07/08/17 1800)  LORazepam (ATIVAN) injection 0-4 mg (has no administration in time range)    Or  LORazepam (ATIVAN) tablet 0-4 mg (has no administration in time range)  thiamine (B-1) injection 100 mg (100 mg Intravenous Given 07/08/17 1758)  thiamine (B-1) injection 100 mg ( Intravenous Canceled Entry 07/08/17 1857)  metoCLOPramide (REGLAN) injection 10 mg (10 mg Intravenous Given 07/08/17 1757)  magnesium sulfate IVPB 2 g 50 mL (2 g Intravenous New Bag/Given 07/08/17 1802)  multivitamin with minerals tablet 1 tablet (1 tablet Oral Given 07/08/17 1756)      Initial Impression / Assessment and Plan / ED Course  I have reviewed the triage vital signs and the nursing notes.  Pertinent labs & imaging results that were available during my care of the patient were reviewed by me and considered in my medical decision making (see chart for details).     At this time the patient appears to be mildly somnolent but is able to follow commands, I am very concerned about his EKG as it does show a prolonged QT that on my measurement is over  550 and definitely different from his prior EKG which I reviewed.  There is no signs of QRS prolongation or arrhythmia, he is not seizing, he will need Ativan if he starts to withdrawal, we will start the CIWA protocol but will also need to talk with psychiatry as this patient is definitely decompensated with regards to his anxiety and depression though it seems that his alcohol use is compounding this.  The patient is not actively suicidal but is definitely medically ill with a prolonged QT.  EKG was definitely abnormal Required magnesium and cardiac monitoring Multiple repeat evaluations without decompensation of his neurologic or cardiac status Discussed care with the hospitalist will admit to stepdown for ongoing cardiac monitoring until resolution of QT interval.  The patient is critically ill after a large overdose with an abnormal EKG, critical care provided  Discussed with Dr. Toniann Fail Will admit  Final Clinical Impressions(s) / ED Diagnoses   Final diagnoses:  Drug overdose, undetermined intent, initial encounter  Prolonged Q-T interval on ECG  Alcoholic intoxication with complication (HCC)  Suicidal thoughts      [name], MD 07/08/17 2002

## 2017-07-08 NOTE — H&P (Signed)
History and Physical    Zachary [name] UJW:[id number] DOB: 1960/07/18 DOA: 07/08/2017  PCP: Patient, No Pcp Per  Patient coming from: Home.  Chief Complaint: Drug overdose.  HPI: Zachary [name] is a 57 y.o. male with history of depression, hypertension, alcohol abuse presents to the ER after patient had consumed over the last 12 hours 15 tablets of his trazodone 100 mg.  Patient states he was not feeling sleepy and was feeling depressed started started taking his trazodone dose.  Following which he started feeling uneasy and came to the ER.  Patient also has been noticing some black stools over the last 3 to 4 days and had some diarrhea which is dark today.  Uses Alka-Seltzer.  Drinks alcohol every day.  Has never had GI bleed previously.  Patient denies taking any other medications and overdose.  Has not taken his Risperdal today.  Usually used to be on gabapentin and antihypertensive which he has not been taking for last few weeks.  ED Course: In the ER EKG shows prolonged QTC at 579 ms patient's labs show mild hypokalemia.  Poison control advised to closely follow-up QTC and correct potassium and magnesium.  If QRS is prolonged will need bicarbonate infusions.  Patient stool for occult blood came positive.  Hemoglobin is stable at this time patient is started on Protonix infusion.  Patient also started on CIWA protocol for alcohol withdrawal.  Review of Systems: As per HPI, rest all negative.   Past Medical History:  Diagnosis Date  . Alcohol abuse   . Hypertension     Past Surgical History:  Procedure Laterality Date  . COLONOSCOPY    . MOUTH SURGERY    . NO PAST SURGERIES       reports that he has been smoking cigarettes.  He has been smoking about 2.00 packs per day. He has never used smokeless tobacco. He reports that he drinks alcohol. He reports that he has current or past drug history. Drug: Marijuana.  Allergies  Allergen Reactions  . Penicillins Other (See Comments)     Childhood allergy Reaction:  Unknown  Has patient had a PCN reaction causing immediate rash, facial/tongue/throat swelling, SOB or lightheadedness with hypotension: Unsure Has patient had a PCN reaction causing severe rash involving mucus membranes or skin necrosis: Unsure Has patient had a PCN reaction that required hospitalization Unsure Has patient had a PCN reaction occurring within the last 10 years: No If all of the above answers are "NO", then may proceed with Cephalosporin use.  . Statins Palpitations    Family History  Problem Relation Age of Onset  . Heart attack Paternal Grandfather   . Stroke Mother   . Heart failure Mother   . Heart failure Father     Prior to Admission medications   Medication Sig Start Date End Date Taking? Authorizing Provider  aspirin-sod bicarb-citric acid (ALKA-SELTZER) 325 MG TBEF tablet Take 155 mg by mouth 2 (two) times daily as needed (acid reflux,headache).   Yes [provider]  FLUoxetine (PROZAC) 20 MG capsule Take 60 mg by mouth every morning. 06/18/17  Yes [provider]  FLUoxetine (PROZAC) 40 MG capsule Take 1 capsule (40 mg total) by mouth daily. For mood control 01/04/17  Yes Money, Gerlene Burdock, FNP  gabapentin (NEURONTIN) 300 MG capsule Take 1 capsule (300 mg total) by mouth 2 (two) times daily. For agitation 01/03/17  Yes Money, Gerlene Burdock, FNP  risperiDONE (RISPERDAL) 0.5 MG tablet Take 0.5 mg by  mouth 2 (two) times daily. 06/18/17  Yes [provider]  traZODone (DESYREL) 100 MG tablet Take 100 mg by mouth at bedtime as needed for sleep. 06/10/17  Yes [provider]  chlorproMAZINE (THORAZINE) 25 MG tablet Take 1 tablet (25 mg total) by mouth 3 (three) times daily. For mood control Patient not taking: Reported on 07/08/2017 01/03/17   Money, Gerlene Burdock, FNP  lisinopril (PRINIVIL,ZESTRIL) 20 MG tablet Take 1 tablet (20 mg total) by mouth daily. For high blood pressure Patient not taking: Reported on  07/08/2017 01/04/17   Maryfrances Bunnell, FNP    Physical Exam: Vitals:   07/08/17 2030 07/08/17 2100 07/08/17 2130 07/08/17 2200  BP: (!) 153/90 (!) 150/86 (!) 152/93 (!) 127/103  Pulse: 82 82 86 79  Resp: 20 17 (!) 24 19  SpO2: 94% (!) 89% 93% 91%  Weight:      Height:          Constitutional: Moderately built and nourished woman Vitals:   07/08/17 2030 07/08/17 2100 07/08/17 2130 07/08/17 2200  BP: (!) 153/90 (!) 150/86 (!) 152/93 (!) 127/103  Pulse: 82 82 86 79  Resp: 20 17 (!) 24 19  SpO2: 94% (!) 89% 93% 91%  Weight:      Height:       Eyes: Anicteric no pallor. ENMT: No discharge from the ears eyes nose or mouth. Neck: No mass felt.  No neck rigidity. Respiratory: No rhonchi or crepitations. Cardiovascular: S1-S2 heard no murmurs appreciated. Abdomen: Soft nontender bowel sounds present. Musculoskeletal: No edema.  No joint effusion. Skin: No rash.  Skin appears warm. Neurologic: Alert awake oriented to time place and person.  Moves all extremities. Psychiatric: Has depression.   Labs on Admission: I have personally reviewed following labs and imaging studies  CBC: Recent Labs  Lab 07/08/17 1740  WBC 7.3  HGB 16.6  HCT 44.6  MCV 88.7  PLT 187   Basic Metabolic Panel: Recent Labs  Lab 07/08/17 1740  NA 138  K 3.4*  CL 101  CO2 23  GLUCOSE 116*  BUN 10  CREATININE 0.72  CALCIUM 9.1   GFR: Estimated Creatinine Clearance: 107.2 mL/min (by C-G formula based on SCr of 0.72 mg/dL). Liver Function Tests: Recent Labs  Lab 07/08/17 1740  AST 29  ALT 37  ALKPHOS 75  BILITOT 0.7  PROT 7.1  ALBUMIN 4.0   No results for input(s): LIPASE, AMYLASE in the last 168 hours. No results for input(s): AMMONIA in the last 168 hours. Coagulation Profile: No results for input(s): INR, PROTIME in the last 168 hours. Cardiac Enzymes: No results for input(s): CKTOTAL, CKMB, CKMBINDEX, TROPONINI in the last 168 hours. BNP (last 3 results) No results for  input(s): PROBNP in the last 8760 hours. HbA1C: No results for input(s): HGBA1C in the last 72 hours. CBG: No results for input(s): GLUCAP in the last 168 hours. Lipid Profile: No results for input(s): CHOL, HDL, LDLCALC, TRIG, CHOLHDL, LDLDIRECT in the last 72 hours. Thyroid Function Tests: No results for input(s): TSH, T4TOTAL, FREET4, T3FREE, THYROIDAB in the last 72 hours. Anemia Panel: No results for input(s): VITAMINB12, FOLATE, FERRITIN, TIBC, IRON, RETICCTPCT in the last 72 hours. Urine analysis:    Component Value Date/Time   COLORURINE YELLOW 10/20/2016 0404   APPEARANCEUR HAZY (A) 10/20/2016 0404   LABSPEC 1.013 10/20/2016 0404   PHURINE 5.0 10/20/2016 0404   GLUCOSEU NEGATIVE 10/20/2016 0404   HGBUR NEGATIVE 10/20/2016 0404   BILIRUBINUR NEGATIVE 10/20/2016 0404  KETONESUR NEGATIVE 10/20/2016 0404   PROTEINUR 30 (A) 10/20/2016 0404   UROBILINOGEN 0.2 03/26/2007 1755   NITRITE NEGATIVE 10/20/2016 0404   LEUKOCYTESUR NEGATIVE 10/20/2016 0404   Sepsis Labs: (procalcitonin:4,lacticidven:4) )No results found for this or any previous visit (from the past 240 hour(s)).   Radiological Exams on Admission: No results found.  EKG: Independently reviewed.  Normal sinus rhythm with prolonged QTC 579 ms.  QRS 104 ms.  Assessment/Plan Principal Problem:   Drug overdose Active Problems:   Alcohol abuse   HTN (hypertension), benign   Acute GI bleeding   Depression    1. Intentional overdose with trazodone with depression and suicidal thoughts-discussed with poison control at this time poison control advised EKG every 4 hours to make sure QTC is getting corrected and also dementia QRS not prolonging.  If QTC is getting prolonged then to check magnesium and potassium and correct accordingly.  If QRS is prolonged and will need bicarb boluses until her QRS is corrected.  Hold trazodone.  Consult psychiatry in a.m.  Suicide precautions. 2. Acute GI bleeding -patient is  placed on Protonix infusion.  Will keep patient n.p.o. past midnight.  Follow CBC.  Consult GI in a.m. 3. Hypertension -has not been taking antihypertensives for last few weeks.  Will keep patient on PRN hydralazine. 4. Alcohol abuse and polysubstance abuse -psychiatry will need to be consulted patient on CIWA protocol.   DVT prophylaxis: SCDs. Code Status: Full code. Family Communication: Discussed with patient. Disposition Plan: To be determined. Consults called: None. Admission status: Observation.   Eduard Clos MD [facility] Pager [phone].  If 7PM-7AM, please contact night-coverage [email or web address] Password [facility]  07/08/2017, 10:38 PM

## 2017-07-09 DIAGNOSIS — R450 Nervousness: Secondary | ICD-10-CM

## 2017-07-09 DIAGNOSIS — F17210 Nicotine dependence, cigarettes, uncomplicated: Secondary | ICD-10-CM

## 2017-07-09 DIAGNOSIS — F1910 Other psychoactive substance abuse, uncomplicated: Secondary | ICD-10-CM

## 2017-07-09 DIAGNOSIS — F329 Major depressive disorder, single episode, unspecified: Secondary | ICD-10-CM

## 2017-07-09 DIAGNOSIS — R9431 Abnormal electrocardiogram [ECG] [EKG]: Secondary | ICD-10-CM

## 2017-07-09 DIAGNOSIS — F419 Anxiety disorder, unspecified: Secondary | ICD-10-CM

## 2017-07-09 DIAGNOSIS — F1099 Alcohol use, unspecified with unspecified alcohol-induced disorder: Secondary | ICD-10-CM

## 2017-07-09 DIAGNOSIS — F4321 Adjustment disorder with depressed mood: Secondary | ICD-10-CM

## 2017-07-09 DIAGNOSIS — T43211A Poisoning by selective serotonin and norepinephrine reuptake inhibitors, accidental (unintentional), initial encounter: Secondary | ICD-10-CM

## 2017-07-09 DIAGNOSIS — R45851 Suicidal ideations: Secondary | ICD-10-CM

## 2017-07-09 DIAGNOSIS — F1290 Cannabis use, unspecified, uncomplicated: Secondary | ICD-10-CM

## 2017-07-09 LAB — CBC
HCT: 41.6 % (ref 39.0–52.0)
HEMATOCRIT: 43.6 % (ref 39.0–52.0)
HEMATOCRIT: 41.1 % (ref 39.0–52.0)
HEMOGLOBIN: 16.2 g/dL (ref 13.0–17.0)
Hemoglobin: 14.9 g/dL (ref 13.0–17.0)
Hemoglobin: 14.8 g/dL (ref 13.0–17.0)
MCH: 33.1 pg (ref 26.0–34.0)
MCH: 32.6 pg (ref 26.0–34.0)
MCH: 32.9 pg (ref 26.0–34.0)
MCHC: 37.2 g/dL — AB (ref 30.0–36.0)
MCHC: 35.8 g/dL (ref 30.0–36.0)
MCHC: 36 g/dL (ref 30.0–36.0)
MCV: 89.2 fL (ref 78.0–100.0)
MCV: 91 fL (ref 78.0–100.0)
MCV: 91.3 fL (ref 78.0–100.0)
PLATELETS: 161 10*3/uL (ref 150–400)
PLATELETS: 153 10*3/uL (ref 150–400)
Platelets: 170 10*3/uL (ref 150–400)
RBC: 4.89 MIL/uL (ref 4.22–5.81)
RBC: 4.57 MIL/uL (ref 4.22–5.81)
RBC: 4.5 MIL/uL (ref 4.22–5.81)
RDW: 12.9 % (ref 11.5–15.5)
RDW: 13 % (ref 11.5–15.5)
RDW: 13 % (ref 11.5–15.5)
WBC: 9.2 10*3/uL (ref 4.0–10.5)
WBC: 8.2 10*3/uL (ref 4.0–10.5)
WBC: 7.5 10*3/uL (ref 4.0–10.5)

## 2017-07-09 LAB — MAGNESIUM: MAGNESIUM: 2.4 mg/dL (ref 1.7–2.4)

## 2017-07-09 LAB — TROPONIN I
Troponin I: <0.03 ng/mL (ref <0.03)
Troponin I: <0.03 ng/mL (ref <0.03)

## 2017-07-09 LAB — BASIC METABOLIC PANEL
Anion gap: 8 (ref 5–15)
BUN: 14 mg/dL (ref 6–20)
CHLORIDE: 106 mmol/L (ref 101–111)
CO2: 25 mmol/L (ref 22–32)
CREATININE: 0.87 mg/dL (ref 0.61–1.24)
Calcium: 8.8 mg/dL — ABNORMAL LOW (ref 8.9–10.3)
GFR calc Af Amer: >60 mL/min (ref >60)
GFR calc non Af Amer: >60 mL/min (ref >60)
GLUCOSE: 89 mg/dL (ref 65–99)
Potassium: 4.3 mmol/L (ref 3.5–5.1)
Sodium: 139 mmol/L (ref 135–145)

## 2017-07-09 LAB — TYPE AND SCREEN
ABO/RH(D): O NEG
ANTIBODY SCREEN: NEGATIVE

## 2017-07-09 LAB — GLUCOSE, CAPILLARY
GLUCOSE-CAPILLARY: 97 mg/dL (ref 65–99)
Glucose-Capillary: 114 mg/dL — ABNORMAL HIGH (ref 65–99)

## 2017-07-09 LAB — MRSA PCR SCREENING: MRSA by PCR: NEGATIVE

## 2017-07-09 LAB — ABO/RH: ABO/RH(D): O NEG

## 2017-07-09 MED ORDER — GABAPENTIN 100 MG PO CAPS
100.0000 mg | ORAL_CAPSULE | Freq: Two times a day (BID) | ORAL | Status: DC
  Administered 2017-07-09 – 2017-07-11 (×5): 100 mg via ORAL
  Filled 2017-07-09 (×5): qty 1

## 2017-07-09 MED ORDER — LISINOPRIL 20 MG PO TABS
20.0000 mg | ORAL_TABLET | Freq: Every day | ORAL | Status: DC
  Administered 2017-07-09 – 2017-07-11 (×3): 20 mg via ORAL
  Filled 2017-07-09: qty 2
  Filled 2017-07-09: qty 1
  Filled 2017-07-09: qty 2

## 2017-07-09 MED ORDER — PANTOPRAZOLE SODIUM 40 MG IV SOLR
40.0000 mg | Freq: Two times a day (BID) | INTRAVENOUS | Status: DC
  Administered 2017-07-09 – 2017-07-10 (×2): 40 mg via INTRAVENOUS
  Filled 2017-07-09 (×2): qty 40

## 2017-07-09 NOTE — Plan of Care (Signed)
°  Problem: Coping: °Goal: Level of anxiety will decrease °Outcome: Progressing °  °

## 2017-07-09 NOTE — Progress Notes (Signed)
Patient ID: Zachary [name], male   DOB: May 12, 1960, 57 y.o.   MRN: [id number]  PROGRESS NOTE    Zachary [name]  WGN:[id number] DOB: Mar 14, 1961 DOA: 07/08/2017 PCP: Patient, No Pcp Per   Brief Narrative:  57 year old male with  history of depression, hypertension, alcohol abuse presented on 07/08/2017 with drug overdose after consuming 15 tablets of trazodone.  He also had some diarrhea with dark stools.  He had prolonged QTC on presentation at 579 ms.  Poison control advised close follow-up of QTC and correction of potassium and magnesium.  He was also placed on CIWA protocol and started on Protonix infusion.   Assessment & Plan:   Principal Problem:   Drug overdose Active Problems:   Alcohol abuse   HTN (hypertension), benign   Acute GI bleeding   Depression   Intentional overdose with trazodone with depression and suicidal thoughts -Patient more awake today.  Psychiatry consulted -Continue suicide precautions  Prolonged QTC -Probably secondary to trazodone overdose -Improving.  Continue monitoring on telemetry.  Potassium and magnesium levels normal today  Dark stools with question of black stools -Hemoglobin stable with no significant drop in hemoglobin.  No signs of overt melena or hematochezia.  Discontinue Protonix drip.  Start heart healthy diet.  Switch to IV Protonix every 12 hours for now.  Will hold off on GI consultation.  Might need outpatient GI evaluation if the problem persists. -Repeat H&H in a.m.  Hypertension -Blood pressure on the higher side.  Will resume home lisinopril and apparently has not been taking antihypertensives for last few weeks  Alcohol abuse and polysubstance abuse -No signs of withdrawal for now.  Continue CIWA protocol.  Continue thiamine and folic acid   DVT prophylaxis: SCDs Code Status: Full Family Communication: None at bedside Disposition Plan: Patient might need transfer to inpatient psychiatric facility   Consultants:  Psychiatry  Procedures: None  Antimicrobials: None   Subjective: Patient seen and examined at bedside.  He is awake and states that he is hungry.  No current fever, nausea or vomiting.  Objective: Vitals:   07/09/17 0604 07/09/17 0800 07/09/17 0836 07/09/17 0839  BP: (!) 143/80 (!) 171/95 (!) 175/88   Pulse: 68 71 73   Resp: Temp: 98.7 F (37.1 C)   98.5 F (36.9 C)  TempSrc: Oral   Oral  SpO2: 93% 90% 96%   Weight:      Height:        Intake/Output Summary (Last 24 hours) at 07/09/2017 1125 Last data filed at 07/09/2017 1000 Gross per 24 hour  Intake 1973.34 ml  Output -  Net 1973.34 ml   Filed Weights   07/08/17 1730  Weight: 86.2 kg (190 lb)    Examination:  General exam: Appears calm and comfortable  Respiratory system: Bilateral decreased breath sound at bases Cardiovascular system: S1 & S2 heard, rate controlled  gastrointestinal system: Abdomen is nondistended, soft and nontender. Normal bowel sounds heard. Central nervous system: Alert and oriented. No focal neurological deficits. Moving extremities Extremities: No cyanosis, clubbing, edema  Skin: No rashes, lesions or ulcers Psychiatry: Slightly anxious    Data Reviewed: I have personally reviewed following labs and imaging studies  CBC: Recent Labs  Lab 07/08/17 1740 07/08/17 2304 07/09/17 0322 07/09/17 0637  WBC 7.3 9.2 8.2 7.5  HGB 16.6 16.2 14.9 14.8  HCT 44.6 43.6 41.6 41.1  MCV 88.7 89.2 91.0 91.3  PLT 187 170 161 153   Basic  Metabolic Panel: Recent Labs  Lab 07/08/17 1740 07/09/17 0322  NA 138 139  K 3.4* 4.3  CL 101 106  CO2 23 25  GLUCOSE 116* 89  BUN 10 14  CREATININE 0.72 0.87  CALCIUM 9.1 8.8*  MG  --  2.4   GFR: Estimated Creatinine Clearance: 98.6 mL/min (by C-G formula based on SCr of 0.87 mg/dL). Liver Function Tests: Recent Labs  Lab 07/08/17 1740  AST 29  ALT 37  ALKPHOS 75  BILITOT 0.7  PROT 7.1  ALBUMIN 4.0   No results for input(s):  LIPASE, AMYLASE in the last 168 hours. No results for input(s): AMMONIA in the last 168 hours. Coagulation Profile: No results for input(s): INR, PROTIME in the last 168 hours. Cardiac Enzymes: Recent Labs  Lab 07/08/17 2304 07/09/17 0322 07/09/17 1026  TROPONINI <0.03 <0.03 <0.03   BNP (last 3 results) No results for input(s): PROBNP in the last 8760 hours. HbA1C: No results for input(s): HGBA1C in the last 72 hours. CBG: Recent Labs  Lab 07/09/17 0840  GLUCAP 97   Lipid Profile: No results for input(s): CHOL, HDL, LDLCALC, TRIG, CHOLHDL, LDLDIRECT in the last 72 hours. Thyroid Function Tests: No results for input(s): TSH, T4TOTAL, FREET4, T3FREE, THYROIDAB in the last 72 hours. Anemia Panel: No results for input(s): VITAMINB12, FOLATE, FERRITIN, TIBC, IRON, RETICCTPCT in the last 72 hours. Sepsis Labs: No results for input(s): PROCALCITON, LATICACIDVEN in the last 168 hours.  Recent Results (from the past 240 hour(s))  MRSA PCR Screening     Status: None   Collection Time: 07/08/17 11:21 PM  Result Value Ref Range Status   MRSA by PCR NEGATIVE NEGATIVE Final    Comment:        The GeneXpert MRSA Assay (FDA approved for NASAL specimens only), is one component of a comprehensive MRSA colonization surveillance program. It is not intended to diagnose MRSA infection nor to guide or monitor treatment for MRSA infections. Performed at [facility], [address] [place], [place] [address]          Radiology Studies: No results found.      Scheduled Meds: . folic acid  1 mg Oral Daily  . LORazepam  0-4 mg Intravenous Q6H   Followed by  . [START ON 07/11/2017] LORazepam  0-4 mg Intravenous Q12H  . multivitamin with minerals  1 tablet Oral Daily  . pantoprazole  40 mg Intravenous Q12H  . thiamine  100 mg Oral Daily   Or  . thiamine  100 mg Intravenous Daily   Continuous Infusions: . 0.9 % NaCl with KCl 20 mEq / L 100 mL/hr at  07/09/17 0923     LOS: 0 days        Glade Lloyd, MD [facility] Pager [phone]  If 7PM-7AM, please contact night-coverage [email or web address] Password [facility] 07/09/2017, 11:25 AM

## 2017-07-09 NOTE — Consult Note (Signed)
[facility] Consult   Reason for Consult:  Trazodone overdosed Referring Physician:  Dr. Starla Link Patient Identification: Zachary [name] MRN:  [id number] Principal Diagnosis: Adjustment disorder with depressed mood Diagnosis:   Patient Active Problem List   Diagnosis Date Noted  . Adjustment disorder with depressed mood [F43.21] 07/09/2017  . Polysubstance abuse ([place]) [F19.10] 07/09/2017  . Prolonged Q-T interval on ECG [R94.31]   . Suicidal thoughts [R45.851]   . Drug overdose [T50.901A] 07/08/2017  . Acute GI bleeding [K92.2] 07/08/2017  . Depression [F32.9] 07/08/2017  . Alcohol dependence with uncomplicated withdrawal ([place]) [F10.230] 12/30/2016  . Major depressive disorder, recurrent severe without psychotic features ([place]) [F33.2] 12/30/2016  . Chest pain [R07.9] 10/28/2015  . Hypertension [I10] 10/28/2015  . Alcohol abuse [F10.10] 10/28/2015  . Hyperglycemia [R73.9] 10/28/2015  . Alcohol withdrawal ([place]) [F10.239] 10/28/2015  . Tobacco abuse [Z72.0] 10/28/2015  . Chest pain at rest [R07.9] 10/28/2015  . HTN (hypertension), benign [I10]     Total Time spent with patient: 45 minutes  Subjective:   Zachary [name] is a 57 y.o. male patient admitted after he overdosed on Trazodone  HPI:   Patient who reports history of Depression, Alcohol use disorder-severe and hypertension. He reports that he was brought to the hospital after he overdosed on 12 tablets of Trazodone and 2 fifths of Vodka. He reports that he was stressed out by his sons fighting and an Museum/gallery curator who hurts his feelings. Patient was admitted due to hypokalemia and prolong QTC on EKG. Today, patient is alert and oriented, reports ongoing anxiety but denies depression, psychosis, delusions and SI/HI. Patient reports that he was 8 year sober from alcohol but relapse in 2018 and he planned to get alcohol rehab at [facility].  Past Psychiatric History: as above  Risk to Self: Suicidal Ideation:  Not-Currently Present Suicidal Intent: Not-Currently Present Is patient at risk for suicide?: denies Suicidal Plan?: Not-Currently Present Specify Current Suicidal Plan: none today. Access to Means: denies Specify Access to Suicidal Means: pt denies What has been your use of drugs/alcohol within the last 12 months?: reports to daily binge drinking on alcohol  Triggers for Past Attempts: None known Intentional Self Injurious Behavior: None Risk to Others: Homicidal Ideation: No Thoughts of Harm to Others: No Current Homicidal Intent: No Current Homicidal Plan: No Access to Homicidal Means: No History of harm to others?: No Assessment of Violence: None Noted Does patient have access to weapons?: No Criminal Charges Pending?: No Does patient have a court date: No Prior Inpatient Therapy: Prior Inpatient Therapy: Yes Prior Therapy Dates: 2018, 2008 Prior Therapy Facilty/Provider(s): [facility], [facility] Reason for Treatment: ALCOHOL DEPENDENCE  Prior Outpatient Therapy: Prior Outpatient Therapy: No Does patient have an ACCT team?: No Does patient have Intensive In-House Services?  : No Does patient have [facility] services? : No Does patient have P4CC services?: No  Past Medical History:  Past Medical History:  Diagnosis Date  . Alcohol abuse   . Hypertension     Past Surgical History:  Procedure Laterality Date  . COLONOSCOPY    . MOUTH SURGERY    . NO PAST SURGERIES     Family History:  Family History  Problem Relation Age of Onset  . Heart attack Paternal Grandfather   . Stroke Mother   . Heart failure Mother   . Heart failure Father    Family Psychiatric  History:  Social History:  Social History   Substance and Sexual Activity  Alcohol Use Yes  Comment: daily     Social History   Substance and Sexual Activity  Drug Use Yes  . Types: Marijuana    Social History   Socioeconomic History  . Marital status: Divorced    Spouse name: Not on file  . Number of  children: Not on file  . Years of education: Not on file  . Highest education level: Not on file  Occupational History  . Not on file  Social Needs  . Financial resource strain: Not on file  . Food insecurity:    Worry: Not on file    Inability: Not on file  . Transportation needs:    Medical: Not on file    Non-medical: Not on file  Tobacco Use  . Smoking status: Current Some Day Smoker    Packs/day: 2.00    Types: Cigarettes  . Smokeless tobacco: Never Used  Substance and Sexual Activity  . Alcohol use: Yes    Comment: daily  . Drug use: Yes    Types: Marijuana  . Sexual activity: Not on file  Lifestyle  . Physical activity:    Days per week: Not on file    Minutes per session: Not on file  . Stress: Not on file  Relationships  . Social connections:    Talks on phone: Not on file    Gets together: Not on file    Attends religious service: Not on file    Active member of club or organization: Not on file    Attends meetings of clubs or organizations: Not on file    Relationship status: Not on file  Other Topics Concern  . Not on file  Social History Narrative  . Not on file   Additional Social History:    Allergies:   Allergies  Allergen Reactions  . Penicillins Other (See Comments)    Childhood allergy Reaction:  Unknown  Has patient had a PCN reaction causing immediate rash, facial/tongue/throat swelling, SOB or lightheadedness with hypotension: Unsure Has patient had a PCN reaction causing severe rash involving mucus membranes or skin necrosis: Unsure Has patient had a PCN reaction that required hospitalization Unsure Has patient had a PCN reaction occurring within the last 10 years: No If all of the above answers are "NO", then may proceed with Cephalosporin use.  . Statins Palpitations    Labs:  Results for orders placed or performed during the hospital encounter of 07/08/17 (from the past 48 hour(s))  Rapid urine drug screen (hospital performed)      Status: Abnormal   Collection Time: 07/08/17  5:36 PM  Result Value Ref Range   Opiates NONE DETECTED NONE DETECTED   Cocaine NONE DETECTED NONE DETECTED   Benzodiazepines NONE DETECTED NONE DETECTED   Amphetamines POSITIVE (A) NONE DETECTED   Tetrahydrocannabinol POSITIVE (A) NONE DETECTED   Barbiturates NONE DETECTED NONE DETECTED    Comment: (NOTE) DRUG SCREEN FOR MEDICAL PURPOSES ONLY.  IF CONFIRMATION IS NEEDED FOR ANY PURPOSE, NOTIFY LAB WITHIN 5 DAYS. LOWEST DETECTABLE LIMITS FOR URINE DRUG SCREEN Drug Class                     Cutoff (ng/mL) Amphetamine and metabolites    1000 Barbiturate and metabolites    200 Benzodiazepine                 389 Tricyclics and metabolites     300 Opiates and metabolites        300 Cocaine and metabolites  300 THC                            50 Performed at [facility], [place] [address], [place], [place] [address]   Comprehensive metabolic panel     Status: Abnormal   Collection Time: 07/08/17  5:40 PM  Result Value Ref Range   Sodium 138 135 - 145 mmol/L   Potassium 3.4 (L) 3.5 - 5.1 mmol/L   Chloride 101 101 - 111 mmol/L   CO2 23 22 - 32 mmol/L   Glucose, Bld 116 (H) 65 - 99 mg/dL   BUN 10 6 - 20 mg/dL   Creatinine, Ser 0.72 0.61 - 1.24 mg/dL   Calcium 9.1 8.9 - 10.3 mg/dL   Total Protein 7.1 6.5 - 8.1 g/dL   Albumin 4.0 3.5 - 5.0 g/dL   AST 29 15 - 41 U/L   ALT 37 17 - 63 U/L   Alkaline Phosphatase 75 38 - 126 U/L   Total Bilirubin 0.7 0.3 - 1.2 mg/dL   GFR calc non Af Amer >60 >60 mL/min   GFR calc Af Amer >60 >60 mL/min    Comment: (NOTE) The eGFR has been calculated using the CKD EPI equation. This calculation has not been validated in all clinical situations. eGFR's persistently <60 mL/min signify possible Chronic Kidney Disease.    Anion gap 14 5 - 15    Comment: Performed at [facility], [place] [address], [place], [place] [address]  Ethanol     Status: Abnormal    Collection Time: 07/08/17  5:40 PM  Result Value Ref Range   Alcohol, Ethyl (B) 44 (H) <10 mg/dL    Comment:        LOWEST DETECTABLE LIMIT FOR SERUM ALCOHOL IS 10 mg/dL FOR MEDICAL PURPOSES ONLY Performed at [place] [address] [place], [place] [address]   cbc     Status: Abnormal   Collection Time: 07/08/17  5:40 PM  Result Value Ref Range   WBC 7.3 4.0 - 10.5 K/uL   RBC 5.03 4.22 - 5.81 MIL/uL   Hemoglobin 16.6 13.0 - 17.0 g/dL    Comment: REPEATED TO VERIFY CORRECTED FOR COLD AGGLUTININS    HCT 44.6 39.0 - 52.0 %   MCV 88.7 78.0 - 100.0 fL   MCH 33.0 26.0 - 34.0 pg    Comment: REPEATED TO VERIFY CORRECTED FOR COLD AGGLUTININS    MCHC 36.6 (H) 30.0 - 36.0 g/dL    Comment: REPEATED TO VERIFY CORRECTED FOR COLD AGGLUTININS    RDW 12.9 11.5 - 15.5 %   Platelets 187 150 - 400 K/uL    Comment: Performed at [facility], [place] [address], [place], [place] [address]  Acetaminophen level     Status: Abnormal   Collection Time: 07/08/17  5:40 PM  Result Value Ref Range   Acetaminophen (Tylenol), Serum <10 (L) 10 - 30 ug/mL    Comment:        THERAPEUTIC CONCENTRATIONS VARY SIGNIFICANTLY. A RANGE OF 10-30 ug/mL MAY BE AN EFFECTIVE CONCENTRATION FOR MANY PATIENTS. HOWEVER, SOME ARE BEST TREATED AT CONCENTRATIONS OUTSIDE THIS RANGE. ACETAMINOPHEN CONCENTRATIONS >150 ug/mL AT 4 HOURS AFTER INGESTION AND >50 ug/mL AT 12 HOURS AFTER INGESTION ARE OFTEN ASSOCIATED WITH TOXIC REACTIONS. Performed at [facility], [place] [address], [place], [place] [address]   Salicylate level     Status: None  Collection Time: 07/08/17  5:40 PM  Result Value Ref Range   Salicylate Lvl <4.0 2.8 - 30.0 mg/dL    Comment: Performed at [facility], [place] [address], [place], [place] [address]  POC occult blood, ED     Status: Abnormal   Collection Time: 07/08/17  9:28 PM  Result Value Ref Range   Fecal Occult Bld  POSITIVE (A) NEGATIVE  Type and screen [place]     Status: None   Collection Time: 07/08/17 11:02 PM  Result Value Ref Range   ABO/RH(D) O NEG    Antibody Screen NEG    Sample Expiration      07/11/2017 Performed at [facility], [place] [address], [place], [place] [address]   ABO/Rh     Status: None   Collection Time: 07/08/17 11:02 PM  Result Value Ref Range   ABO/RH(D)      Jenetta Downer NEG Performed at [place] [address] [place], [place] [address]   CBC     Status: Abnormal   Collection Time: 07/08/17 11:04 PM  Result Value Ref Range   WBC 9.2 4.0 - 10.5 K/uL   RBC 4.89 4.22 - 5.81 MIL/uL   Hemoglobin 16.2 13.0 - 17.0 g/dL    Comment: RULED OUT INTERFERING SUBSTANCES   HCT 43.6 39.0 - 52.0 %   MCV 89.2 78.0 - 100.0 fL   MCH 33.1 26.0 - 34.0 pg    Comment: RULED OUT INTERFERING SUBSTANCES   MCHC 37.2 (H) 30.0 - 36.0 g/dL    Comment: RULED OUT INTERFERING SUBSTANCES   RDW 12.9 11.5 - 15.5 %   Platelets 170 150 - 400 K/uL    Comment: Performed at [facility], [place] [address], [place], [place] [address]  Troponin I (q 6hr x 3)     Status: None   Collection Time: 07/08/17 11:04 PM  Result Value Ref Range   Troponin I <0.03 <0.03 ng/mL    Comment: Performed at [facility], [place] [address] [place], [place] [address]  MRSA PCR Screening     Status: None   Collection Time: 07/08/17 11:21 PM  Result Value Ref Range   MRSA by PCR NEGATIVE NEGATIVE    Comment:        The GeneXpert MRSA Assay (FDA approved for NASAL specimens only), is one component of a comprehensive MRSA colonization surveillance program. It is not intended to diagnose MRSA infection nor to guide or monitor treatment for MRSA infections. Performed at [facility], [place] [address] [place], [place] [address]   Basic metabolic panel     Status: Abnormal   Collection Time: 07/09/17  3:22 AM   Result Value Ref Range   Sodium 139 135 - 145 mmol/L   Potassium 4.3 3.5 - 5.1 mmol/L    Comment: DELTA CHECK NOTED NO VISIBLE HEMOLYSIS    Chloride 106 101 - 111 mmol/L   CO2 25 22 - 32 mmol/L   Glucose, Bld 89 65 - 99 mg/dL   BUN 14 6 - 20 mg/dL   Creatinine, Ser 0.87 0.61 - 1.24 mg/dL   Calcium 8.8 (L) 8.9 - 10.3 mg/dL   GFR calc non Af Amer >60 >60 mL/min   GFR calc Af Amer >60 >60 mL/min    Comment: (NOTE) The eGFR has been calculated using the CKD EPI equation. This calculation has not been validated in all clinical situations. eGFR's persistently <60 mL/min  signify possible Chronic Kidney Disease.    Anion gap 8 5 - 15    Comment: Performed at [facility], [place] [address] [place], [place] [address]  CBC     Status: None   Collection Time: 07/09/17  3:22 AM  Result Value Ref Range   WBC 8.2 4.0 - 10.5 K/uL   RBC 4.57 4.22 - 5.81 MIL/uL   Hemoglobin 14.9 13.0 - 17.0 g/dL   HCT 41.6 39.0 - 52.0 %   MCV 91.0 78.0 - 100.0 fL   MCH 32.6 26.0 - 34.0 pg   MCHC 35.8 30.0 - 36.0 g/dL   RDW 13.0 11.5 - 15.5 %   Platelets 161 150 - 400 K/uL    Comment: Performed at [facility], [place] [address], [place], [place] [address]  Troponin I (q 6hr x 3)     Status: None   Collection Time: 07/09/17  3:22 AM  Result Value Ref Range   Troponin I <0.03 <0.03 ng/mL    Comment: Performed at [facility], [place] [address] [place], [place] [address]  Magnesium     Status: None   Collection Time: 07/09/17  3:22 AM  Result Value Ref Range   Magnesium 2.4 1.7 - 2.4 mg/dL    Comment: Performed at [facility], [place] [address], [place], [place] [address]  CBC     Status: None   Collection Time: 07/09/17  6:37 AM  Result Value Ref Range   WBC 7.5 4.0 - 10.5 K/uL   RBC 4.50 4.22 - 5.81 MIL/uL   Hemoglobin 14.8 13.0 - 17.0 g/dL   HCT 41.1 39.0 - 52.0 %   MCV 91.3 78.0 - 100.0 fL   MCH 32.9 26.0 - 34.0 pg   MCHC 36.0  30.0 - 36.0 g/dL   RDW 13.0 11.5 - 15.5 %   Platelets 153 150 - 400 K/uL    Comment: Performed at [facility], [place] [address] [place], [place] [address]  Glucose, capillary     Status: None   Collection Time: 07/09/17  8:40 AM  Result Value Ref Range   Glucose-Capillary 97 65 - 99 mg/dL   Comment 1 Notify RN    Comment 2 Document in Chart   Troponin I (q 6hr x 3)     Status: None   Collection Time: 07/09/17 10:26 AM  Result Value Ref Range   Troponin I <0.03 <0.03 ng/mL    Comment: Performed at [facility], [place] [address] [place], [place] [address]    Current Facility-Administered Medications  Medication Dose Route Frequency Provider Last Rate Last Dose  . 0.9 % NaCl with KCl 20 mEq/ L  infusion   Intravenous Continuous Aline August, MD 75 mL/hr at 07/09/17 1138    . folic acid (FOLVITE) tablet 1 mg  1 mg Oral Daily Rise Patience, MD   1 mg at 07/09/17 1443  . gabapentin (NEURONTIN) capsule 100 mg  100 mg Oral BID Alaiya Martindelcampo, MD      . hydrALAZINE (APRESOLINE) injection 5 mg  5 mg Intravenous Q4H PRN Rise Patience, MD   5 mg at 07/09/17 0841  . lisinopril (PRINIVIL,ZESTRIL) tablet 20 mg  20 mg Oral Daily Starla Link, Kshitiz, MD   20 mg at 07/09/17 1243  . LORazepam (ATIVAN) injection 0-4 mg  0-4 mg Intravenous Q6H Rise Patience, MD   2 mg at 07/09/17 1137   Followed by  . [  START ON 07/11/2017] LORazepam (ATIVAN) injection 0-4 mg  0-4 mg Intravenous Q12H Rise Patience, MD      . LORazepam (ATIVAN) tablet 1 mg  1 mg Oral Q6H PRN Rise Patience, MD       Or  . LORazepam (ATIVAN) injection 1 mg  1 mg Intravenous Q6H PRN Rise Patience, MD      . multivitamin with minerals tablet 1 tablet  1 tablet Oral Daily Rise Patience, MD   1 tablet at 07/09/17 [phone]  . pantoprazole (PROTONIX) injection 40 mg  40 mg Intravenous Q12H Alekh, Kshitiz, MD      . thiamine (VITAMIN B-1) tablet 100 mg  100 mg Oral Daily  Rise Patience, MD   100 mg at 07/09/17 9604   Or  . thiamine (B-1) injection 100 mg  100 mg Intravenous Daily Rise Patience, MD        Musculoskeletal: Strength & Muscle Tone: within normal limits Gait & Station: Not assess Patient leans: N/A  Psychiatric Specialty Exam: Physical Exam  Psychiatric: He has a normal mood and affect. His speech is normal and behavior is normal. Judgment and thought content normal. Cognition and memory are normal.    Review of Systems  Constitutional: Negative.   HENT: Negative.   Eyes: Negative.   Respiratory: Negative.   Cardiovascular: Negative.   Gastrointestinal: Positive for nausea.  Genitourinary: Negative.   Skin: Negative.   Neurological: Positive for tremors.  Psychiatric/Behavioral: Positive for substance abuse. The patient is nervous/anxious.     Blood pressure (!) 141/72, pulse 87, temperature 98.5 F (36.9 C), temperature source Oral, resp. rate 16, height _0  (1.803 m), weight 86.2 kg (190 lb), SpO2 95 %.Body mass index is 26.5 kg/m.  General Appearance: Casual  Eye Contact:  Good  Speech:  Clear and Coherent  Volume:  Normal  Mood:  Euthymic  Affect:  anxious  Thought Process:  Coherent and Descriptions of Associations: Intact  Orientation:  Full (Time, Place, and Person)  Thought Content:  Logical  Suicidal Thoughts:  No  Homicidal Thoughts:  No  Memory:  Immediate;   Good Recent;   Good Remote;   Good  Judgement:  Intact  Insight:  Fair  Psychomotor Activity:  Normal  Concentration:  Concentration: Good and Attention Span: Good  Recall:  Good  Fund of Knowledge:  Good  Language:  Good  Akathisia:  No  Handed:  Right  AIMS (if indicated):     Assets:  Communication Skills Desire for Improvement Social Support  ADL's:  Intact  Cognition:  WNL  Sleep:   fair     Treatment Plan Summary: 57 year old man with history of depression and alcohol use disorder who was admitted after he overdosed on  Trazodone and Alcohol due to been stressed out and overwhelmed. Today, patient denies SI/HI and is willing to get alcohol rehab at [facility].  Plan/Recommendations: -Continue Ativan alcohol detox protocol. -Discontinue Tylenol in a patient with alcoholism -Monitor QTC -Consider Gabapentin 100 mg bid for alcohol abuse/mood -Education officer, museum to refer patient to [facility] for alcohol rehab. -Refer patient to [facility] for medication management upon discharge -Patient is cleared by psychiatric service  Disposition: No evidence of imminent risk to self or others at present.   Patient does not meet criteria for psychiatric inpatient admission. Supportive therapy provided about ongoing stressors.  Corena Pilgrim, MD 07/09/2017 1:13 PM

## 2017-07-10 LAB — CBC WITH DIFFERENTIAL/PLATELET
Basophils Absolute: 0 10*3/uL (ref 0.0–0.1)
Basophils Relative: 0 %
Eosinophils Absolute: 0.3 10*3/uL (ref 0.0–0.7)
Eosinophils Relative: 3 %
HCT: 40.1 % (ref 39.0–52.0)
HEMOGLOBIN: 14 g/dL (ref 13.0–17.0)
LYMPHS ABS: 2 10*3/uL (ref 0.7–4.0)
LYMPHS PCT: 25 %
MCH: 32.5 pg (ref 26.0–34.0)
MCHC: 34.9 g/dL (ref 30.0–36.0)
MCV: 93 fL (ref 78.0–100.0)
MONOS PCT: 8 %
Monocytes Absolute: 0.7 10*3/uL (ref 0.1–1.0)
NEUTROS ABS: 5 10*3/uL (ref 1.7–7.7)
NEUTROS PCT: 64 %
Platelets: 139 10*3/uL — ABNORMAL LOW (ref 150–400)
RBC: 4.31 MIL/uL (ref 4.22–5.81)
RDW: 13.1 % (ref 11.5–15.5)
WBC: 8 10*3/uL (ref 4.0–10.5)

## 2017-07-10 LAB — COMPREHENSIVE METABOLIC PANEL
ALK PHOS: 69 U/L (ref 38–126)
ALT: 27 U/L (ref 17–63)
ANION GAP: 8 (ref 5–15)
AST: 21 U/L (ref 15–41)
Albumin: 3.2 g/dL — ABNORMAL LOW (ref 3.5–5.0)
BUN: 11 mg/dL (ref 6–20)
CALCIUM: 8.5 mg/dL — AB (ref 8.9–10.3)
CO2: 22 mmol/L (ref 22–32)
CREATININE: 0.81 mg/dL (ref 0.61–1.24)
Chloride: 105 mmol/L (ref 101–111)
GFR calc Af Amer: >60 mL/min (ref >60)
GFR calc non Af Amer: >60 mL/min (ref >60)
GLUCOSE: 90 mg/dL (ref 65–99)
Potassium: 3.8 mmol/L (ref 3.5–5.1)
Sodium: 135 mmol/L (ref 135–145)
Total Bilirubin: 0.8 mg/dL (ref 0.3–1.2)
Total Protein: 5.4 g/dL — ABNORMAL LOW (ref 6.5–8.1)

## 2017-07-10 LAB — GLUCOSE, CAPILLARY
GLUCOSE-CAPILLARY: 86 mg/dL (ref 65–99)
GLUCOSE-CAPILLARY: 132 mg/dL — AB (ref 65–99)
Glucose-Capillary: 143 mg/dL — ABNORMAL HIGH (ref 65–99)

## 2017-07-10 LAB — MAGNESIUM: Magnesium: 2 mg/dL (ref 1.7–2.4)

## 2017-07-10 MED ORDER — PANTOPRAZOLE SODIUM 40 MG PO TBEC
40.0000 mg | DELAYED_RELEASE_TABLET | Freq: Every day | ORAL | Status: DC
  Administered 2017-07-11: 40 mg via ORAL
  Filled 2017-07-10: qty 1

## 2017-07-10 NOTE — Progress Notes (Signed)
Patient transferred from ICU/Stepdown. RN agrees with previous assessment. Alert and Oriented x 4.

## 2017-07-10 NOTE — Progress Notes (Signed)
Patient ID: Zachary [name], male   DOB: 02/23/1961, 57 y.o.   MRN: [id number]  PROGRESS NOTE    Zachary [name]  NFA:[id number] DOB: September 11, 1960 DOA: 07/08/2017 PCP: Patient, No Pcp Per   Brief Narrative:  57 year old male with  history of depression, hypertension, alcohol abuse presented on 07/08/2017 with drug overdose after consuming 15 tablets of trazodone.  He also had some diarrhea with dark stools.  He had prolonged QTC on presentation at 579 ms.  Poison control advised close follow-up of QTC and correction of potassium and magnesium.  He was also placed on CIWA protocol and started on Protonix infusion.   Assessment & Plan:   Principal Problem:   Adjustment disorder with depressed mood Active Problems:   Alcohol abuse   HTN (hypertension), benign   Drug overdose   Acute GI bleeding   Depression   Prolonged Q-T interval on ECG   Suicidal thoughts   Polysubstance abuse (HCC)   Intentional overdose with trazodone with depression and suicidal thoughts -Psychiatric consult appreciated: Patient has been cleared by psychiatry.  Suicide precautions discontinued.  -Transfer patient to telemetry  Prolonged QTC -Probably secondary to trazodone overdose -Improving.  Continue monitoring on telemetry.  Potassium and magnesium levels normal today  Dark stools with question of black stools -Hemoglobin stable with no significant drop in hemoglobin.  No signs of overt melena or hematochezia.  Switch IV Protonix to oral Protonix once a day.   Might need outpatient GI evaluation if the problem persists. -Repeat H&H in a.m.  Hypertension -Blood pressure on the higher side.  Increase lisinopril to 40 mg daily  Alcohol abuse and polysubstance abuse -Continue CIWA protocol.  Patient feels slightly shaky today.  Continue thiamine and folic acid -Continue gabapentin as per psychiatric recommendations -Psychiatry recommended social worker to refer patient to [facility] for alcohol rehab.  Social  worker consult pending  DVT prophylaxis: SCDs Code Status: Full Family Communication: None at bedside Disposition Plan: Home in 1 to 2 days once clinically improves  Consultants: Psychiatry  Procedures: None  Antimicrobials: None   Subjective: Patient seen and examined at bedside.  He is sleepy, wakes up slightly on calling his name.  He feels shaky this morning.  Does not feel that well.  No overnight fever or vomiting. Objective: Vitals:   07/10/17 0400 07/10/17 0800 07/10/17 0915 07/10/17 1019  BP: (!) 154/73  (!) 184/98 (!) 147/62  Pulse: 60     Resp: 15     Temp:  98.2 F (36.8 C)    TempSrc:  Oral    SpO2: 93%     Weight:      Height:        Intake/Output Summary (Last 24 hours) at 07/10/2017 1023 Last data filed at 07/09/2017 2000 Gross per 24 hour  Intake 1270.83 ml  Output 2 ml  Net 1268.83 ml   Filed Weights   07/08/17 1730  Weight: 86.2 kg (190 lb)    Examination:  General exam: Appears slightly anxious.  No acute distress  respiratory system: Bilateral decreased breath sounds at bases  cardiovascular system: S1 & S2 heard, rate controlled  gastrointestinal system: Abdomen is nondistended, soft and nontender. Normal bowel sounds heard. Extremities: No cyanosis, clubbing, edema    Data Reviewed: I have personally reviewed following labs and imaging studies  CBC: Recent Labs  Lab 07/08/17 1740 07/08/17 2304 07/09/17 0322 07/09/17 0637 07/10/17 0343  WBC 7.3 9.2 8.2 7.5 8.0  NEUTROABS  --   --   --   --  5.0  HGB 16.6 16.2 14.9 14.8 14.0  HCT 44.6 43.6 41.6 41.1 40.1  MCV 88.7 89.2 91.0 91.3 93.0  PLT 187 170 161 153 139*   Basic Metabolic Panel: Recent Labs  Lab 07/08/17 1740 07/09/17 0322 07/10/17 0343  NA 138 139 135  K 3.4* 4.3 3.8  CL 101 106 105  CO2 GLUCOSE 116* 89 90  BUN CREATININE 0.72 0.87 0.81  CALCIUM 9.1 8.8* 8.5*  MG  --  2.4 2.0   GFR: Estimated Creatinine Clearance: 105.9 mL/min (by C-G  formula based on SCr of 0.81 mg/dL). Liver Function Tests: Recent Labs  Lab 07/08/17 1740 07/10/17 0343  AST 29 21  ALT 37 27  ALKPHOS 75 69  BILITOT 0.7 0.8  PROT 7.1 5.4*  ALBUMIN 4.0 3.2*   No results for input(s): LIPASE, AMYLASE in the last 168 hours. No results for input(s): AMMONIA in the last 168 hours. Coagulation Profile: No results for input(s): INR, PROTIME in the last 168 hours. Cardiac Enzymes: Recent Labs  Lab 07/08/17 2304 07/09/17 0322 07/09/17 1026  TROPONINI <0.03 <0.03 <0.03   BNP (last 3 results) No results for input(s): PROBNP in the last 8760 hours. HbA1C: No results for input(s): HGBA1C in the last 72 hours. CBG: Recent Labs  Lab 07/09/17 0840 07/09/17 1725 07/09/17 2321 07/10/17 0812  GLUCAP 97 143* 114* 86   Lipid Profile: No results for input(s): CHOL, HDL, LDLCALC, TRIG, CHOLHDL, LDLDIRECT in the last 72 hours. Thyroid Function Tests: No results for input(s): TSH, T4TOTAL, FREET4, T3FREE, THYROIDAB in the last 72 hours. Anemia Panel: No results for input(s): VITAMINB12, FOLATE, FERRITIN, TIBC, IRON, RETICCTPCT in the last 72 hours. Sepsis Labs: No results for input(s): PROCALCITON, LATICACIDVEN in the last 168 hours.  Recent Results (from the past 240 hour(s))  MRSA PCR Screening     Status: None   Collection Time: 07/08/17 11:21 PM  Result Value Ref Range Status   MRSA by PCR NEGATIVE NEGATIVE Final    Comment:        The GeneXpert MRSA Assay (FDA approved for NASAL specimens only), is one component of a comprehensive MRSA colonization surveillance program. It is not intended to diagnose MRSA infection nor to guide or monitor treatment for MRSA infections. Performed at [facility], [address], [place], [place] [address]          Radiology Studies: No results found.      Scheduled Meds: . folic acid  1 mg Oral Daily  . gabapentin  100 mg Oral BID  . lisinopril  20 mg Oral Daily  .  LORazepam  0-4 mg Intravenous Q6H   Followed by  . [START ON 07/11/2017] LORazepam  0-4 mg Intravenous Q12H  . multivitamin with minerals  1 tablet Oral Daily  . pantoprazole  40 mg Intravenous Q12H  . thiamine  100 mg Oral Daily   Or  . thiamine  100 mg Intravenous Daily   Continuous Infusions:    LOS: 0 days        Glade Lloyd, MD [facility] Pager [phone]  If 7PM-7AM, please contact night-coverage [email or web address] Password [facility] 07/10/2017, 10:23 AM

## 2017-07-10 NOTE — Progress Notes (Signed)
SBAR REPORT GIVEN TO Nelly Rout, RN [phone]. ALLOWANCE FOR ALL QUESTIONS TO BE ASKED AND ANSWERED.

## 2017-07-10 NOTE — Progress Notes (Signed)
SBAR report received from [place], [place]; care assumed.

## 2017-07-10 NOTE — Progress Notes (Signed)
PATIENT TRANSPORTED TO 1505 WITH CELL PHONE.

## 2017-07-11 DIAGNOSIS — F10929 Alcohol use, unspecified with intoxication, unspecified: Secondary | ICD-10-CM

## 2017-07-11 DIAGNOSIS — F1010 Alcohol abuse, uncomplicated: Secondary | ICD-10-CM

## 2017-07-11 DIAGNOSIS — T50904A Poisoning by unspecified drugs, medicaments and biological substances, undetermined, initial encounter: Secondary | ICD-10-CM

## 2017-07-11 DIAGNOSIS — F1910 Other psychoactive substance abuse, uncomplicated: Secondary | ICD-10-CM

## 2017-07-11 DIAGNOSIS — F332 Major depressive disorder, recurrent severe without psychotic features: Secondary | ICD-10-CM

## 2017-07-11 DIAGNOSIS — F4321 Adjustment disorder with depressed mood: Secondary | ICD-10-CM

## 2017-07-11 DIAGNOSIS — I10 Essential (primary) hypertension: Secondary | ICD-10-CM

## 2017-07-11 LAB — GLUCOSE, CAPILLARY
GLUCOSE-CAPILLARY: 84 mg/dL (ref 65–99)
GLUCOSE-CAPILLARY: 92 mg/dL (ref 65–99)

## 2017-07-11 MED ORDER — LISINOPRIL 20 MG PO TABS: 20.0000 mg | ORAL_TABLET | Freq: Every day | ORAL | 0 refills | Status: DC

## 2017-07-11 MED ORDER — PANTOPRAZOLE SODIUM 40 MG PO TBEC: 40.0000 mg | DELAYED_RELEASE_TABLET | Freq: Every day | ORAL | 0 refills | Status: DC

## 2017-07-11 MED ORDER — ACETAMINOPHEN 325 MG PO TABS
650.0000 mg | ORAL_TABLET | Freq: Four times a day (QID) | ORAL | Status: DC | PRN
  Administered 2017-07-11: 650 mg via ORAL
  Filled 2017-07-11: qty 2

## 2017-07-11 MED ORDER — ADULT MULTIVITAMIN W/MINERALS CH: 1.0000 | ORAL_TABLET | Freq: Every day | ORAL | 0 refills | Status: DC

## 2017-07-11 MED ORDER — THIAMINE HCL 100 MG PO TABS: 100.0000 mg | ORAL_TABLET | Freq: Every day | ORAL | 0 refills | Status: DC

## 2017-07-11 MED ORDER — FOLIC ACID 1 MG PO TABS: 1.0000 mg | ORAL_TABLET | Freq: Every day | ORAL | 0 refills | Status: DC

## 2017-07-11 NOTE — Progress Notes (Signed)
Patient requesting pain medication for mild HA.  MD notified via text page.

## 2017-07-11 NOTE — Progress Notes (Signed)
Discharge instructions and medications discussed with patient.  Prescription and AVS given to patient.  All questions answered.  

## 2017-07-11 NOTE — Discharge Summary (Signed)
Physician Discharge Summary  Zachary [name]:[id number] DOB: 16-Aug-1960 DOA: 07/08/2017  PCP: Patient, No Pcp Per  Admit date: 07/08/2017 Discharge date: 07/11/2017  Admitted From: Home Disposition: Home  Recommendations for Outpatient Follow-up:  1. Follow up with PCP in 1 week 2. Follow-up with psychiatry at earliest convenience 3. Psychiatry recommended outpatient follow-up for alcohol rehab  Home Health: No Equipment/Devices: None  Discharge Condition: Stable CODE STATUS: Full Diet recommendation: Heart Healthy    Brief/Interim Summary: 57 year old male with history of depression, hypertension, alcohol abuse presented on 07/08/2017 with drug overdose after consuming 15 tablets of trazodone.  He also had some diarrhea with dark stools.  He had prolonged QTC on presentation at 579 ms.  Poison control advised close follow-up of QTC and correction of potassium and magnesium.  He was also placed on CIWA protocol and started on Protonix infusion.  Patient's mental status improved during the hospital stay.  His QTC also improved.  Psychiatry evaluated the patient and cleared the patient for discharge with outpatient follow-up for alcohol rehab.  Social worker was also consulted.  No black stools during the hospitalization.  Hemoglobin has remained stable.  He will be discharged home with outpatient follow-up with primary care provider and psychiatry.   Discharge Diagnoses:  Principal Problem:   Adjustment disorder with depressed mood Active Problems:   Alcohol abuse   HTN (hypertension), benign   Drug overdose   Acute GI bleeding   Depression   Prolonged Q-T interval on ECG   Suicidal thoughts   Polysubstance abuse (HCC)  Intentional overdose with trazodone with depression and suicidal thoughts -Psychiatry consulted: Patient has been cleared by psychiatry.  Suicide precautions discontinued.  -Outpatient follow-up with psychiatry.  Trazodone discontinued. -Discharge patient  home   Prolonged QTC -Probably secondary to trazodone overdose.  Poison control was involved who recommended to continue monitoring QTC and check potassium and magnesium. -Improved.  Dark stools with question of black stools -Hemoglobin stable with no significant drop in hemoglobin.  No signs of overt melena or hematochezia.    Initially on IV Protonix which was switched to oral Protonix.   Might need outpatient GI evaluation if the problem persists. -Discharge home on Protonix 40 mg daily.  Avoid alcohol  Hypertension -Blood pressure on the higher side.   Continue lisinopril.  Patient needs to comply with his medications.  Outpatient follow-up with primary care provider  Alcohol abuse and polysubstance abuse -Currently on CIWA protocol.   Improved.  Continue thiamine and folic acid with multivitamin.  Continue gabapentin as per psychiatry recommendations -Psychiatry recommended social worker to refer patient to [facility] for alcohol rehab.   -Counseled regarding cessation   Discharge Instructions  Discharge Instructions    Ambulatory referral to Psychiatry   Complete by:  As directed    Recent trazodone overdose   Call MD for:  difficulty breathing, headache or visual disturbances   Complete by:  As directed    Call MD for:  extreme fatigue   Complete by:  As directed    Call MD for:  hives   Complete by:  As directed    Call MD for:  persistant dizziness or light-headedness   Complete by:  As directed    Call MD for:  persistant nausea and vomiting   Complete by:  As directed    Call MD for:  severe uncontrolled pain   Complete by:  As directed    Call MD for:  temperature >100.4   Complete  by:  As directed    Diet - low sodium heart healthy   Complete by:  As directed    Increase activity slowly   Complete by:  As directed      Allergies as of 07/11/2017      Reactions   Penicillins Other (See Comments)   Childhood allergy Reaction:  Unknown  Has patient had a PCN  reaction causing immediate rash, facial/tongue/throat swelling, SOB or lightheadedness with hypotension: Unsure Has patient had a PCN reaction causing severe rash involving mucus membranes or skin necrosis: Unsure Has patient had a PCN reaction that required hospitalization Unsure Has patient had a PCN reaction occurring within the last 10 years: No If all of the above answers are "NO", then may proceed with Cephalosporin use.   Statins Palpitations      Medication List    STOP taking these medications   chlorproMAZINE 25 MG tablet Commonly known as:  THORAZINE   traZODone 100 MG tablet Commonly known as:  DESYREL     TAKE these medications   aspirin-sod bicarb-citric acid 325 MG Tbef tablet Commonly known as:  ALKA-SELTZER Take 155 mg by mouth 2 (two) times daily as needed (acid reflux,headache).   FLUoxetine 20 MG capsule Commonly known as:  PROZAC Take 60 mg by mouth every morning.   folic acid 1 MG tablet Commonly known as:  FOLVITE Take 1 tablet (1 mg total) by mouth daily.   gabapentin 300 MG capsule Commonly known as:  NEURONTIN Take 1 capsule (300 mg total) by mouth 2 (two) times daily. For agitation   lisinopril 20 MG tablet Commonly known as:  PRINIVIL,ZESTRIL Take 1 tablet (20 mg total) by mouth daily. What changed:  additional instructions   multivitamin with minerals Tabs tablet Take 1 tablet by mouth daily.   pantoprazole 40 MG tablet Commonly known as:  PROTONIX Take 1 tablet (40 mg total) by mouth daily.   risperiDONE 0.5 MG tablet Commonly known as:  RISPERDAL Take 0.5 mg by mouth 2 (two) times daily.   thiamine 100 MG tablet Take 1 tablet (100 mg total) by mouth daily.      Follow-up Information    Primary care provider. Schedule an appointment as soon as possible for a visit in 1 week(s).        Psychiatrist. Schedule an appointment as soon as possible for a visit in 1 week(s).          Allergies  Allergen Reactions  . Penicillins  Other (See Comments)    Childhood allergy Reaction:  Unknown  Has patient had a PCN reaction causing immediate rash, facial/tongue/throat swelling, SOB or lightheadedness with hypotension: Unsure Has patient had a PCN reaction causing severe rash involving mucus membranes or skin necrosis: Unsure Has patient had a PCN reaction that required hospitalization Unsure Has patient had a PCN reaction occurring within the last 10 years: No If all of the above answers are "NO", then may proceed with Cephalosporin use.  . Statins Palpitations    Consultations:  Psychiatry   Procedures/Studies:  No results found.   Subjective: Patient seen and examined at bedside.  No overnight fever, nausea or vomiting.  He feels better and wants to go home. Discharge Exam: Vitals:   07/11/17 0653 07/11/17 0800  BP: (!) 155/89 (!) 146/76  Pulse: 66 (!) 57  Resp:    Temp:    SpO2:     Vitals:   07/10/17 2210 07/11/17 0604 07/11/17 0653 07/11/17 0800  BP: 131/86 Marland Kitchen)  174/94 (!) 155/89 (!) 146/76  Pulse: (!) 57 62 66 (!) 57  Resp: 20 15    Temp: 98.8 F (37.1 C) 98.2 F (36.8 C)    TempSrc:      SpO2: 96% 97%    Weight:      Height:        General: Pt is alert, awake, not in acute distress Cardiovascular: Slightly bradycardic, S1/S2 + Respiratory: Bilateral decreased breath sounds at bases Abdominal: Soft, NT, ND, bowel sounds + Extremities: no edema, no cyanosis    The results of significant diagnostics from this hospitalization (including imaging, microbiology, ancillary and laboratory) are listed below for reference.     Microbiology: Recent Results (from the past 240 hour(s))  MRSA PCR Screening     Status: None   Collection Time: 07/08/17 11:21 PM  Result Value Ref Range Status   MRSA by PCR NEGATIVE NEGATIVE Final    Comment:        The GeneXpert MRSA Assay (FDA approved for NASAL specimens only), is one component of a comprehensive MRSA colonization surveillance program.  It is not intended to diagnose MRSA infection nor to guide or monitor treatment for MRSA infections. Performed at [facility], [address] [place], [place] [address]      Labs: BNP (last 3 results) No results for input(s): BNP in the last 8760 hours. Basic Metabolic Panel: Recent Labs  Lab 07/08/17 1740 07/09/17 0322 07/10/17 0343  NA 138 139 135  K 3.4* 4.3 3.8  CL 101 106 105  CO2 GLUCOSE 116* 89 90  BUN CREATININE 0.72 0.87 0.81  CALCIUM 9.1 8.8* 8.5*  MG  --  2.4 2.0   Liver Function Tests: Recent Labs  Lab 07/08/17 1740 07/10/17 0343  AST 29 21  ALT 37 27  ALKPHOS 75 69  BILITOT 0.7 0.8  PROT 7.1 5.4*  ALBUMIN 4.0 3.2*   No results for input(s): LIPASE, AMYLASE in the last 168 hours. No results for input(s): AMMONIA in the last 168 hours. CBC: Recent Labs  Lab 07/08/17 1740 07/08/17 2304 07/09/17 0322 07/09/17 0637 07/10/17 0343  WBC 7.3 9.2 8.2 7.5 8.0  NEUTROABS  --   --   --   --  5.0  HGB 16.6 16.2 14.9 14.8 14.0  HCT 44.6 43.6 41.6 41.1 40.1  MCV 88.7 89.2 91.0 91.3 93.0  PLT 187 170 161 153 139*   Cardiac Enzymes: Recent Labs  Lab 07/08/17 2304 07/09/17 0322 07/09/17 1026  TROPONINI <0.03 <0.03 <0.03   BNP: Invalid input(s): POCBNP CBG: Recent Labs  Lab 07/09/17 2321 07/10/17 0812 07/10/17 1533 07/11/17 0019 07/11/17 0716  GLUCAP 114* 86 132* 84 92   D-Dimer No results for input(s): DDIMER in the last 72 hours. Hgb A1c No results for input(s): HGBA1C in the last 72 hours. Lipid Profile No results for input(s): CHOL, HDL, LDLCALC, TRIG, CHOLHDL, LDLDIRECT in the last 72 hours. Thyroid function studies No results for input(s): TSH, T4TOTAL, T3FREE, THYROIDAB in the last 72 hours.  Invalid input(s): FREET3 Anemia work up No results for input(s): VITAMINB12, FOLATE, FERRITIN, TIBC, IRON, RETICCTPCT in the last 72 hours. Urinalysis    Component Value Date/Time   COLORURINE  YELLOW 10/20/2016 0404   APPEARANCEUR HAZY (A) 10/20/2016 0404   LABSPEC 1.013 10/20/2016 0404   PHURINE 5.0 10/20/2016 0404   GLUCOSEU NEGATIVE 10/20/2016 0404   HGBUR NEGATIVE 10/20/2016 0404   BILIRUBINUR NEGATIVE 10/20/2016  0404   KETONESUR NEGATIVE 10/20/2016 0404   PROTEINUR 30 (A) 10/20/2016 0404   UROBILINOGEN 0.2 03/26/2007 1755   NITRITE NEGATIVE 10/20/2016 0404   LEUKOCYTESUR NEGATIVE 10/20/2016 0404   Sepsis Labs Invalid input(s): PROCALCITONIN,  WBC,  LACTICIDVEN Microbiology Recent Results (from the past 240 hour(s))  MRSA PCR Screening     Status: None   Collection Time: 07/08/17 11:21 PM  Result Value Ref Range Status   MRSA by PCR NEGATIVE NEGATIVE Final    Comment:        The GeneXpert MRSA Assay (FDA approved for NASAL specimens only), is one component of a comprehensive MRSA colonization surveillance program. It is not intended to diagnose MRSA infection nor to guide or monitor treatment for MRSA infections. Performed at [facility], [address] [place], [place] [address]      Time coordinating discharge: 35 minutes  SIGNED:   Glade Lloyd, MD  [facility] 07/11/2017, 9:24 AM Pager: [phone]  If 7PM-7AM, please contact night-coverage [email or web address] Password [email or web address]

## 2017-07-11 NOTE — Evaluation (Signed)
Physical Therapy Evaluation Patient Details Name: AARON BOSTWICK MRN: [id number] DOB: 20-Nov-1960 Today's Date: 07/11/2017   History of Present Illness  57 year old male with  history of depression, hypertension, alcohol abuse presented on 07/08/2017 with drug overdose after consuming 15 tablets of trazodone.    Clinical Impression  Pt independently ambulated 250' without an assistive device, no loss of balance. He is independent with mobility and ready to DC home from PT standpoint.     Follow Up Recommendations No PT follow up    Equipment Recommendations  None recommended by PT    Recommendations for Other Services       Precautions / Restrictions Precautions Precautions: None Precaution Comments: denies h/o falls in past 1 year Restrictions Weight Bearing Restrictions: No      Mobility  Bed Mobility Overal bed mobility: Independent                Transfers Overall transfer level: Independent Equipment used: None                Ambulation/Gait Ambulation/Gait assistance: Independent Ambulation Distance (Feet): 250 Feet Assistive device: None Gait Pattern/deviations: WFL(Within Functional Limits)     General Gait Details: steady, no loss of balance  Stairs            Wheelchair Mobility    Modified Rankin (Stroke Patients Only)       Balance Overall balance assessment: No apparent balance deficits (not formally assessed)                                           Pertinent Vitals/Pain Pain Assessment: No/denies pain    Home Living Family/patient expects to be discharged to:: Private residence Living Arrangements: Spouse/significant other Available Help at Discharge: Family;Available PRN/intermittently         Home Layout: One level Home Equipment: None      Prior Function Level of Independence: Independent         Comments: works in Psychologist, counselling        Extremity/Trunk  Assessment   Upper Extremity Assessment Upper Extremity Assessment: Overall WFL for tasks assessed    Lower Extremity Assessment Lower Extremity Assessment: Overall WFL for tasks assessed    Cervical / Trunk Assessment Cervical / Trunk Assessment: Normal  Communication      Cognition Arousal/Alertness: Awake/alert Behavior During Therapy: WFL for tasks assessed/performed Overall Cognitive Status: Within Functional Limits for tasks assessed                                        General Comments      Exercises     Assessment/Plan    PT Assessment Patent does not need any further PT services  PT Problem List         PT Treatment Interventions      PT Goals (Current goals can be found in the Care Plan section)  Acute Rehab PT Goals PT Goal Formulation: All assessment and education complete, DC therapy    Frequency     Barriers to discharge        Co-evaluation               AM-PAC PT "6 Clicks" Daily Activity  Outcome Measure Difficulty turning over in  bed (including adjusting bedclothes, sheets and blankets)?: None Difficulty moving from lying on back to sitting on the side of the bed? : None Difficulty sitting down on and standing up from a chair with arms (e.g., wheelchair, bedside commode, etc,.)?: None Help needed moving to and from a bed to chair (including a wheelchair)?: None Help needed walking in hospital room?: None Help needed climbing 3-5 steps with a railing? : None 6 Click Score: 24    End of Session Equipment Utilized During Treatment: Gait belt Activity Tolerance: Patient tolerated treatment well Patient left: Other (comment)(in bathroom) Nurse Communication: Mobility status      Time: 4540-9811 PT Time Calculation (min) (ACUTE ONLY): 8 min   Charges:   PT Evaluation $PT Eval Low Complexity: 1 Low     PT G Codes:        Tamala Ser 07/11/2017, 10:07 AM [phone]

## 2017-07-12 ENCOUNTER — Telehealth: Payer: Self-pay | Admitting: Internal Medicine

## 2017-07-12 LAB — HIV ANTIBODY (ROUTINE TESTING W REFLEX): HIV SCREEN 4TH GENERATION: REACTIVE — AB

## 2017-07-12 LAB — HIV 1/2 AB DIFFERENTIATION
HIV 1 Ab: NEGATIVE
HIV 2 AB: NEGATIVE
NOTE (HIV CONF MULTISPOT): NEGATIVE

## 2017-07-12 LAB — RNA QUALITATIVE: HIV 1 RNA Qualitative: 1

## 2017-07-12 NOTE — Telephone Encounter (Signed)
His HIV screen was reactive but his HIV 1 and 2 antibodies and RNA are negative.  This is a false positive screen.

## 2017-07-14 ENCOUNTER — Emergency Department (HOSPITAL_COMMUNITY): Payer: Self-pay

## 2017-07-14 ENCOUNTER — Encounter (HOSPITAL_COMMUNITY): Payer: Self-pay | Admitting: Registered Nurse

## 2017-07-14 ENCOUNTER — Emergency Department (HOSPITAL_COMMUNITY)
Admission: EM | Admit: 2017-07-14 | Discharge: 2017-07-14 | Disposition: A | Discharge status: Home / Self Care | Payer: Self-pay | Attending: Emergency Medicine | Admitting: Emergency Medicine

## 2017-07-14 ENCOUNTER — Telehealth (HOSPITAL_COMMUNITY): Payer: Self-pay | Admitting: Psychiatry

## 2017-07-14 DIAGNOSIS — R0789 Other chest pain: Secondary | ICD-10-CM | POA: Insufficient documentation

## 2017-07-14 DIAGNOSIS — F1010 Alcohol abuse, uncomplicated: Secondary | ICD-10-CM

## 2017-07-14 DIAGNOSIS — F17210 Nicotine dependence, cigarettes, uncomplicated: Secondary | ICD-10-CM | POA: Insufficient documentation

## 2017-07-14 DIAGNOSIS — F10120 Alcohol abuse with intoxication, uncomplicated: Secondary | ICD-10-CM | POA: Insufficient documentation

## 2017-07-14 DIAGNOSIS — F419 Anxiety disorder, unspecified: Secondary | ICD-10-CM | POA: Insufficient documentation

## 2017-07-14 DIAGNOSIS — Y901 Blood alcohol level of 20-39 mg/100 ml: Secondary | ICD-10-CM | POA: Insufficient documentation

## 2017-07-14 DIAGNOSIS — Z79899 Other long term (current) drug therapy: Secondary | ICD-10-CM | POA: Insufficient documentation

## 2017-07-14 DIAGNOSIS — R51 Headache: Secondary | ICD-10-CM | POA: Insufficient documentation

## 2017-07-14 DIAGNOSIS — I10 Essential (primary) hypertension: Secondary | ICD-10-CM | POA: Insufficient documentation

## 2017-07-14 LAB — COMPREHENSIVE METABOLIC PANEL
ALK PHOS: 85 U/L (ref 38–126)
ALT: 28 U/L (ref 17–63)
AST: 20 U/L (ref 15–41)
Albumin: 4 g/dL (ref 3.5–5.0)
Anion gap: 11 (ref 5–15)
BILIRUBIN TOTAL: 0.6 mg/dL (ref 0.3–1.2)
BUN: 6 mg/dL (ref 6–20)
CALCIUM: 8.9 mg/dL (ref 8.9–10.3)
CO2: 22 mmol/L (ref 22–32)
CREATININE: 0.85 mg/dL (ref 0.61–1.24)
Chloride: 102 mmol/L (ref 101–111)
Glucose, Bld: 102 mg/dL — ABNORMAL HIGH (ref 65–99)
Potassium: 3.7 mmol/L (ref 3.5–5.1)
Sodium: 135 mmol/L (ref 135–145)
TOTAL PROTEIN: 6.6 g/dL (ref 6.5–8.1)

## 2017-07-14 LAB — RAPID URINE DRUG SCREEN, HOSP PERFORMED
Amphetamines: POSITIVE — AB
BARBITURATES: NOT DETECTED
Benzodiazepines: NOT DETECTED
COCAINE: NOT DETECTED
OPIATES: NOT DETECTED
Tetrahydrocannabinol: POSITIVE — AB

## 2017-07-14 LAB — CBC
HEMATOCRIT: 41.7 % (ref 39.0–52.0)
Hemoglobin: 15.3 g/dL (ref 13.0–17.0)
MCH: 32.8 pg (ref 26.0–34.0)
MCHC: 36.7 g/dL — ABNORMAL HIGH (ref 30.0–36.0)
MCV: 89.3 fL (ref 78.0–100.0)
PLATELETS: 159 10*3/uL (ref 150–400)
RBC: 4.67 MIL/uL (ref 4.22–5.81)
RDW: 13 % (ref 11.5–15.5)
WBC: 8 10*3/uL (ref 4.0–10.5)

## 2017-07-14 LAB — I-STAT TROPONIN, ED: Troponin i, poc: 0 ng/mL (ref 0.00–0.08)

## 2017-07-14 LAB — SALICYLATE LEVEL

## 2017-07-14 LAB — ETHANOL: ALCOHOL ETHYL (B): 30 mg/dL — AB (ref <10)

## 2017-07-14 LAB — ACETAMINOPHEN LEVEL

## 2017-07-14 MED ORDER — THIAMINE HCL 100 MG/ML IJ SOLN: 100.0000 mg | Freq: Every day | INTRAMUSCULAR | Status: DC

## 2017-07-14 MED ORDER — LORAZEPAM 1 MG PO TABS: 0.0000 mg | ORAL_TABLET | Freq: Two times a day (BID) | ORAL | Status: DC

## 2017-07-14 MED ORDER — LORAZEPAM 2 MG/ML IJ SOLN: 0.0000 mg | Freq: Two times a day (BID) | INTRAMUSCULAR | Status: DC

## 2017-07-14 MED ORDER — VITAMIN B-1 100 MG PO TABS
100.0000 mg | ORAL_TABLET | Freq: Every day | ORAL | Status: DC
  Administered 2017-07-14: 100 mg via ORAL
  Filled 2017-07-14: qty 1

## 2017-07-14 MED ORDER — LORAZEPAM 1 MG PO TABS: 0.0000 mg | ORAL_TABLET | Freq: Four times a day (QID) | ORAL | Status: DC

## 2017-07-14 MED ORDER — LORAZEPAM 2 MG/ML IJ SOLN: 0.0000 mg | Freq: Four times a day (QID) | INTRAMUSCULAR | Status: DC

## 2017-07-14 MED ORDER — LORAZEPAM 2 MG/ML IJ SOLN
2.0000 mg | Freq: Once | INTRAMUSCULAR | Status: AC
  Administered 2017-07-14: 2 mg via INTRAVENOUS
  Filled 2017-07-14: qty 1

## 2017-07-14 MED ORDER — NICOTINE 21 MG/24HR TD PT24: 21.0000 mg | MEDICATED_PATCH | Freq: Every day | TRANSDERMAL | Status: DC

## 2017-07-14 NOTE — Consult Note (Signed)
Tele Psych Assessment   Zachary [name], 57 y.o., male patient presented to [facility] after accidental overdose of Trazodone and alcohol intoxication; Patient was then admitted for inpatient treatment at [facility].  Patient seen via telepsych by this provider; chart reviewed and consulted with Dr. Lucianne Muss on 07/14/17.  On evaluation [name] reports prior to hospital admission he was at [facility] after calling and reporting that he had been drinking and took to many Trazodone.  "It wasn't a suicide attempt.  Last week I had been pretty aggravated  with some thing and was having trouble sleeping.  I had been drinking pretty heavy.  I took 2 or 3 Trazodone and still couldn't sleep; got up ate 2 or 3 more.  I was up and down eating 2 or 3 trying to get to sleep.  By Saturday I had ate about 12.  I was the one to call and told that I had been drinking pretty heavy and took to much Trazodone."  Patient denies suicidal/self-harm/homicidal ideation, psychosis, and paranoia.  Patient states that he is interested in outpatient services and substance use services.    During evaluation MARILYN WING is alert/oriented x 4; calm/cooperative with pleasant affect.  He does not appear to be responding to internal/external stimuli or delusional thoughts.  Patient denies suicidal/self-harm/homicidal ideation, psychosis, and paranoia.  Patient answered question appropriately.    Recommendations:  Referral and set up with Intensive out patient services.  Send community resources for substance use disorder.     Disposition: No evidence of imminent risk to self or others at present.   Patient does not meet criteria for psychiatric inpatient admission. Refer to IOP.   For detailed note see TTS Tele Assessment Note  Jalaiya Oyster B. Annakate Soulier, NP

## 2017-07-14 NOTE — ED Notes (Signed)
Declined W/C at D/C and was escorted to lobby by RN. 

## 2017-07-14 NOTE — ED Triage Notes (Signed)
TC from [facility]  Requesting to give OUT PT services with [facility] a the prefered  Out pt services. This information given to PT with his AVS. At DC.

## 2017-07-14 NOTE — BH Assessment (Signed)
Zachary [name] is a 57 y.o. male who presents to the ED voluntarily due to a self-reported unintentional OD of Trazodone. Pt states he has been binge drinking up to 2 fifths of vodka daily. He reports he had 8 years of sobriety prior to relapse. Pt denies SI, HI, & AVH. He would like to follow up with [facility]. Message was left with Jeri Modena at [facility]. Pt states he would stay with his sister until he starts the program.   Disposition: Outpatient treatment at [facility]  Diagnosis: F 31.2 MDD, recurrent, severe; Alcohol use disorder, severe

## 2017-07-14 NOTE — ED Triage Notes (Signed)
BIB EMS from home, pt reports L sided CP non radiating. States he thinks pain is r/t him withdrawing from ETOH. Pt also seen at [facility] 4/27 for intentional Trazadone OD. Pt states he took X10 Trazadone yesterday but it was not intentional he took them over the course of the day rather than at 1 time.

## 2017-07-14 NOTE — ED Triage Notes (Signed)
07-14-17 @ 1013 TTS done.

## 2017-07-14 NOTE — ED Provider Notes (Signed)
[facility] EMERGENCY DEPARTMENT Provider Note   CSN: [id number] Arrival date & time: 07/14/17  1191     History   Chief Complaint Chief Complaint  Patient presents with  . Chest Pain    HPI Zachary [name] is a 57 y.o. male.  The history is provided by the patient.  Alcohol Problem  This is a chronic problem. The problem occurs constantly. The problem has been gradually worsening. Associated symptoms include chest pain and headaches. Pertinent negatives include no abdominal pain and no shortness of breath. Nothing aggravates the symptoms. Nothing relieves the symptoms.  Patient with history of alcohol abuse and hypertension presents with multiple complaints.  He reports he woke up feeling very anxious and having chest pain.  He also reports mild headache.  He reports he has been drinking heavily again.  He reports that he is undergoing alcohol withdrawal.  He will occasionally smoke marijuana.  He also reports taking up to 10 trazodone to help with sleep. Discussed all the hospital for similar episode. He reports chest pain is improving, but he now feels anxious and feels that he is undergoing alcohol withdrawal Denies recent falls or trauma  Past Medical History:  Diagnosis Date  . Alcohol abuse   . Hypertension     Patient Active Problem List   Diagnosis Date Noted  . Adjustment disorder with depressed mood 07/09/2017  . Polysubstance abuse (HCC) 07/09/2017  . Prolonged Q-T interval on ECG   . Suicidal thoughts   . Drug overdose 07/08/2017  . Acute GI bleeding 07/08/2017  . Depression 07/08/2017  . Alcohol dependence with uncomplicated withdrawal (HCC) 12/30/2016  . Major depressive disorder, recurrent severe without psychotic features (HCC) 12/30/2016  . Chest pain 10/28/2015  . Hypertension 10/28/2015  . Alcohol abuse 10/28/2015  . Hyperglycemia 10/28/2015  . Alcohol withdrawal (HCC) 10/28/2015  . Tobacco abuse 10/28/2015  . Chest pain at rest  10/28/2015  . HTN (hypertension), benign     Past Surgical History:  Procedure Laterality Date  . COLONOSCOPY    . MOUTH SURGERY    . NO PAST SURGERIES          Home Medications    Prior to Admission medications   Medication Sig Start Date End Date Taking? Authorizing Provider  aspirin-sod bicarb-citric acid (ALKA-SELTZER) 325 MG TBEF tablet Take 155 mg by mouth 2 (two) times daily as needed (acid reflux,headache).   Yes [provider]  FLUoxetine (PROZAC) 20 MG capsule Take 60 mg by mouth daily.  06/18/17  Yes [provider]  risperiDONE (RISPERDAL) 0.5 MG tablet Take 0.5 mg by mouth 2 (two) times daily. 06/18/17  Yes [provider]  folic acid (FOLVITE) 1 MG tablet Take 1 tablet (1 mg total) by mouth daily. 07/11/17   Glade Lloyd, MD  gabapentin (NEURONTIN) 300 MG capsule Take 1 capsule (300 mg total) by mouth 2 (two) times daily. For agitation 01/03/17   Money, Gerlene Burdock, FNP  lisinopril (PRINIVIL,ZESTRIL) 20 MG tablet Take 1 tablet (20 mg total) by mouth daily. 07/11/17   Glade Lloyd, MD  Multiple Vitamin (MULTIVITAMIN WITH MINERALS) TABS tablet Take 1 tablet by mouth daily. 07/11/17   Glade Lloyd, MD  pantoprazole (PROTONIX) 40 MG tablet Take 1 tablet (40 mg total) by mouth daily. 07/11/17   Glade Lloyd, MD  thiamine 100 MG tablet Take 1 tablet (100 mg total) by mouth daily. 07/11/17   Glade Lloyd, MD    Family History Family History  Problem  Relation Age of Onset  . Heart attack Paternal Grandfather   . Stroke Mother   . Heart failure Mother   . Heart failure Father     Social History Social History   Tobacco Use  . Smoking status: Current Some Day Smoker    Packs/day: 2.00    Types: Cigarettes  . Smokeless tobacco: Never Used  Substance Use Topics  . Alcohol use: Yes    Comment: daily  . Drug use: Yes    Types: Marijuana     Allergies   Penicillins and Statins   Review of Systems Review of Systems  Constitutional:  Negative for fever.  Respiratory: Negative for shortness of breath.   Cardiovascular: Positive for chest pain.  Gastrointestinal: Negative for abdominal pain.  Neurological: Positive for headaches.  Psychiatric/Behavioral: The patient is nervous/anxious.   All other systems reviewed and are negative.    Physical Exam Updated Vital Signs BP (!) 144/73   Pulse 87   Temp 98.3 F (36.8 C) (Oral)   Resp 19   Ht 1.803 m ( )   Wt 86.2 kg (190 lb)   SpO2 96%   BMI 26.50 kg/m   Physical Exam CONSTITUTIONAL: anxious and disheveled HEAD: Normocephalic/atraumatic EYES: EOMI/PERRL ENMT: Mucous membranes moist NECK: supple no meningeal signs SPINE/BACK:entire spine nontender CV: S1/S2 noted, no murmurs/rubs/gallops noted LUNGS: Lungs are clear to auscultation bilaterally, no apparent distress ABDOMEN: soft, nontender, no rebound or guarding, bowel sounds noted throughout abdomen GU:no cva tenderness NEURO: Pt is awake/alert/appropriate, moves all extremitiesx4.  No facial droop.  Mild tremor noted EXTREMITIES: pulses normal/equal, full ROM SKIN: warm, color normal PSYCH:anxious  ED Treatments / Results  Labs (all labs ordered are listed, but only abnormal results are displayed) Labs Reviewed  COMPREHENSIVE METABOLIC PANEL - Abnormal; Notable for the following components:      Result Value   Glucose, Bld 102 (*)    All other components within normal limits  ETHANOL - Abnormal; Notable for the following components:   Alcohol, Ethyl (B) 30 (*)    All other components within normal limits  CBC - Abnormal; Notable for the following components:   MCHC 36.7 (*)    All other components within normal limits  RAPID URINE DRUG SCREEN, HOSP PERFORMED - Abnormal; Notable for the following components:   Amphetamines POSITIVE (*)    Tetrahydrocannabinol POSITIVE (*)    All other components within normal limits  ACETAMINOPHEN LEVEL  SALICYLATE LEVEL  I-STAT TROPONIN, ED     EKG EKG Interpretation  Date/Time:  Friday Jul 14 2017 05:24:53 EDT Ventricular Rate:  85 PR Interval:  128 QRS Duration: 98 QT Interval:  406 QTC Calculation: 483 R Axis:   56 Text Interpretation:  Normal sinus rhythm Prolonged QT Abnormal ECG Confirmed by Zadie Rhine ([id number]) on 07/14/2017 6:08:30 AM Also confirmed by Zadie Rhine ([id number]), editor Elita Quick ([id number])  on 07/14/2017 7:01:39 AM   Radiology Dg Chest 2 View  Result Date: 07/14/2017 CLINICAL DATA:  57 year old male with chest pain. EXAM: CHEST - 2 VIEW COMPARISON:  Chest radiograph dated 08/06/2016 FINDINGS: There is mild emphysematous changes of the lungs. No focal consolidation, pleural effusion, or pneumothorax. The cardiac silhouette is within normal limits. No acute osseous pathology. IMPRESSION: No active cardiopulmonary disease. Electronically Signed   By: Elgie Collard M.D.   On: 07/14/2017 06:23    Procedures Procedures    Medications Ordered in ED Medications  LORazepam (ATIVAN) injection 0-4 mg (has no administration in time range)  Or  LORazepam (ATIVAN) tablet 0-4 mg (has no administration in time range)  LORazepam (ATIVAN) injection 0-4 mg (has no administration in time range)    Or  LORazepam (ATIVAN) tablet 0-4 mg (has no administration in time range)  thiamine (VITAMIN B-1) tablet 100 mg (has no administration in time range)    Or  thiamine (B-1) injection 100 mg (has no administration in time range)  nicotine (NICODERM CQ - dosed in mg/24 hours) patch 21 mg (has no administration in time range)  LORazepam (ATIVAN) injection 2 mg (2 mg Intravenous Given 07/14/17 0650)     Initial Impression / Assessment and Plan / ED Course  I have reviewed the triage vital signs and the nursing notes.  Pertinent labs & imaging results that were available during my care of the patient were reviewed by me and considered in my medical decision making (see chart for details).     7:40  AM Patient here for chest pain, headache and anxiety.  Also reports he feels that he is withdrawing from alcohol.  He was just discharged in the hospital on April 30 for similar episode.  It is unlikely that he is undergoing life-threatening withdrawal at this time 8:14 AM Overall patient is improved.  His tremors are improved.  He denies any active chest pain at this time. Patient denies any SI at this time.  He did take trazodone over several hours, but is mostly for sleep, denies any SI.  He reports he has been to [facility] before, and he would like to go back there.  We will consult psych for evaluation. Final Clinical Impressions(s) / ED Diagnoses   Final diagnoses:  Alcohol abuse    ED Discharge Orders    None       Zadie Rhine, MD 07/14/17 [phone]

## 2017-07-21 ENCOUNTER — Emergency Department (HOSPITAL_COMMUNITY): Payer: Self-pay

## 2017-07-21 ENCOUNTER — Encounter (HOSPITAL_COMMUNITY): Payer: Self-pay | Admitting: *Deleted

## 2017-07-21 ENCOUNTER — Emergency Department (HOSPITAL_COMMUNITY)
Admission: EM | Admit: 2017-07-21 | Discharge: 2017-07-22 | Disposition: A | Discharge status: Home / Self Care | Payer: Self-pay | Attending: Emergency Medicine | Admitting: Emergency Medicine

## 2017-07-21 DIAGNOSIS — S12491A Other nondisplaced fracture of fifth cervical vertebra, initial encounter for closed fracture: Secondary | ICD-10-CM | POA: Insufficient documentation

## 2017-07-21 DIAGNOSIS — F1010 Alcohol abuse, uncomplicated: Secondary | ICD-10-CM

## 2017-07-21 DIAGNOSIS — R51 Headache: Secondary | ICD-10-CM | POA: Insufficient documentation

## 2017-07-21 DIAGNOSIS — W19XXXA Unspecified fall, initial encounter: Secondary | ICD-10-CM

## 2017-07-21 DIAGNOSIS — F17210 Nicotine dependence, cigarettes, uncomplicated: Secondary | ICD-10-CM | POA: Insufficient documentation

## 2017-07-21 DIAGNOSIS — F1014 Alcohol abuse with alcohol-induced mood disorder: Secondary | ICD-10-CM

## 2017-07-21 DIAGNOSIS — Y92012 Bathroom of single-family (private) house as the place of occurrence of the external cause: Secondary | ICD-10-CM | POA: Insufficient documentation

## 2017-07-21 DIAGNOSIS — S129XXA Fracture of neck, unspecified, initial encounter: Secondary | ICD-10-CM

## 2017-07-21 DIAGNOSIS — Y999 Unspecified external cause status: Secondary | ICD-10-CM | POA: Insufficient documentation

## 2017-07-21 DIAGNOSIS — Z23 Encounter for immunization: Secondary | ICD-10-CM | POA: Insufficient documentation

## 2017-07-21 DIAGNOSIS — Z79899 Other long term (current) drug therapy: Secondary | ICD-10-CM | POA: Insufficient documentation

## 2017-07-21 DIAGNOSIS — Y92009 Unspecified place in unspecified non-institutional (private) residence as the place of occurrence of the external cause: Secondary | ICD-10-CM

## 2017-07-21 DIAGNOSIS — Y906 Blood alcohol level of 120-199 mg/100 ml: Secondary | ICD-10-CM | POA: Insufficient documentation

## 2017-07-21 DIAGNOSIS — I10 Essential (primary) hypertension: Secondary | ICD-10-CM | POA: Insufficient documentation

## 2017-07-21 DIAGNOSIS — Z008 Encounter for other general examination: Secondary | ICD-10-CM

## 2017-07-21 DIAGNOSIS — Y9389 Activity, other specified: Secondary | ICD-10-CM | POA: Insufficient documentation

## 2017-07-21 DIAGNOSIS — W01198A Fall on same level from slipping, tripping and stumbling with subsequent striking against other object, initial encounter: Secondary | ICD-10-CM | POA: Insufficient documentation

## 2017-07-21 LAB — COMPREHENSIVE METABOLIC PANEL
ALT: 52 U/L (ref 17–63)
ANION GAP: 12 (ref 5–15)
AST: 39 U/L (ref 15–41)
Albumin: 3.9 g/dL (ref 3.5–5.0)
Alkaline Phosphatase: 95 U/L (ref 38–126)
BUN: 5 mg/dL — ABNORMAL LOW (ref 6–20)
CHLORIDE: 104 mmol/L (ref 101–111)
CO2: 26 mmol/L (ref 22–32)
CREATININE: 0.72 mg/dL (ref 0.61–1.24)
Calcium: 8.9 mg/dL (ref 8.9–10.3)
Glucose, Bld: 90 mg/dL (ref 65–99)
Potassium: 3.3 mmol/L — ABNORMAL LOW (ref 3.5–5.1)
SODIUM: 142 mmol/L (ref 135–145)
Total Bilirubin: 0.5 mg/dL (ref 0.3–1.2)
Total Protein: 6.7 g/dL (ref 6.5–8.1)

## 2017-07-21 LAB — RAPID URINE DRUG SCREEN, HOSP PERFORMED
Amphetamines: NOT DETECTED
Barbiturates: NOT DETECTED
Benzodiazepines: POSITIVE — AB
COCAINE: NOT DETECTED
Opiates: NOT DETECTED
Tetrahydrocannabinol: NOT DETECTED

## 2017-07-21 LAB — CBC WITH DIFFERENTIAL/PLATELET
BASOS ABS: 0 10*3/uL (ref 0.0–0.1)
BASOS PCT: 0 %
Eosinophils Absolute: 0.2 10*3/uL (ref 0.0–0.7)
Eosinophils Relative: 2 %
HCT: 43.6 % (ref 39.0–52.0)
HEMOGLOBIN: 15.9 g/dL (ref 13.0–17.0)
Lymphocytes Relative: 33 %
Lymphs Abs: 2.5 10*3/uL (ref 0.7–4.0)
MCH: 33.2 pg (ref 26.0–34.0)
MCHC: 36.5 g/dL — AB (ref 30.0–36.0)
MCV: 91 fL (ref 78.0–100.0)
MONO ABS: 0.9 10*3/uL (ref 0.1–1.0)
MONOS PCT: 12 %
NEUTROS PCT: 53 %
Neutro Abs: 4 10*3/uL (ref 1.7–7.7)
Platelets: 182 10*3/uL (ref 150–400)
RBC: 4.79 MIL/uL (ref 4.22–5.81)
RDW: 13 % (ref 11.5–15.5)
WBC: 7.5 10*3/uL (ref 4.0–10.5)

## 2017-07-21 LAB — ETHANOL: ALCOHOL ETHYL (B): 165 mg/dL — AB (ref <10)

## 2017-07-21 LAB — SALICYLATE LEVEL

## 2017-07-21 LAB — ACETAMINOPHEN LEVEL

## 2017-07-21 MED ORDER — POTASSIUM CHLORIDE CRYS ER 20 MEQ PO TBCR
20.0000 meq | EXTENDED_RELEASE_TABLET | Freq: Once | ORAL | Status: AC
  Administered 2017-07-21: 20 meq via ORAL
  Filled 2017-07-21: qty 1

## 2017-07-21 MED ORDER — IBUPROFEN 200 MG PO TABS
600.0000 mg | ORAL_TABLET | Freq: Three times a day (TID) | ORAL | Status: DC | PRN
  Administered 2017-07-21: 600 mg via ORAL
  Filled 2017-07-21: qty 3

## 2017-07-21 MED ORDER — TETANUS-DIPHTH-ACELL PERTUSSIS 5-2.5-18.5 LF-MCG/0.5 IM SUSP
0.5000 mL | Freq: Once | INTRAMUSCULAR | Status: AC
  Administered 2017-07-22: 0.5 mL via INTRAMUSCULAR
  Filled 2017-07-21: qty 0.5

## 2017-07-21 MED ORDER — VITAMIN B-1 100 MG PO TABS
100.0000 mg | ORAL_TABLET | Freq: Every day | ORAL | Status: DC
  Administered 2017-07-21 – 2017-07-22 (×2): 100 mg via ORAL
  Filled 2017-07-21 (×2): qty 1

## 2017-07-21 MED ORDER — LORAZEPAM 1 MG PO TABS
0.0000 mg | ORAL_TABLET | Freq: Four times a day (QID) | ORAL | Status: DC
  Administered 2017-07-21 – 2017-07-22 (×2): 1 mg via ORAL
  Administered 2017-07-22: 2 mg via ORAL
  Filled 2017-07-21 (×2): qty 1
  Filled 2017-07-21: qty 2

## 2017-07-21 MED ORDER — ADULT MULTIVITAMIN W/MINERALS CH
1.0000 | ORAL_TABLET | Freq: Once | ORAL | Status: AC
  Administered 2017-07-21: 1 via ORAL
  Filled 2017-07-21: qty 1

## 2017-07-21 MED ORDER — LORAZEPAM 2 MG/ML IJ SOLN: 0.0000 mg | Freq: Four times a day (QID) | INTRAMUSCULAR | Status: DC

## 2017-07-21 MED ORDER — THIAMINE HCL 100 MG/ML IJ SOLN: 100.0000 mg | Freq: Every day | INTRAMUSCULAR | Status: DC

## 2017-07-21 NOTE — Progress Notes (Signed)
TTS to assess pt once he is in a private room. Pt is in the hallway. No private rooms available at this time. EDP Maczis, Elmer Sow, PA-C notified TTS cannot complete the assessment for the pt in the hallway. Will have RN call back when the pt is in a private room and ready to be seen.  Princess Bruins, MSW, LCSW Therapeutic Triage Specialist  [phone]

## 2017-07-21 NOTE — ED Triage Notes (Signed)
Per EMS, pt complain of neck and back pain since falling. Pt was intoxicated upon EMS arrival. Pt states he would like help with quitting alcohol. EMS state the pt's house is in disarray, pt states he has been drinking heavily since he broke up with his girlfriend.

## 2017-07-21 NOTE — ED Provider Notes (Signed)
Alcoholic - fell yesterday TTS consultation today for SI In hallway bed - waiting for private area to do TTS  TTS consultation performed and patient recommended for morning psychiatric re-evaluation.    Elpidio Anis, PA-C 07/22/17 0351    Lorre Nick, MD 07/22/17 1705

## 2017-07-21 NOTE — ED Provider Notes (Signed)
[facility]-EMERGENCY DEPT Provider Note   CSN: [id number] Arrival date & time: 07/21/17  1552     History   Chief Complaint Chief Complaint  Patient presents with  . Fall  . Alcohol Intoxication    HPI Zachary [name] is a 57 y.o. male with a history of alcohol abuse, MDD, hypertension who presents the emergency department today for fall.  Patient states that last night he had drank in several pints of alcohol and went to use the restroom.  When he got off the commode he fell backwards and hit the back of his head on the tub.  He reports possible loss of consciousness but notes he is not able to remember.  Since that time he is complaining of headache as well as neck pain.  He reports that he does continue to drink alcohol and has had several pints today.  He has not taken anything for his symptoms.  He denies any vertigo, dizziness, visual changes, bowel/bladder incontinence, saddle anesthesia, urinary retention, focal weakness, chest pain, shortness of breath, abdominal pain or difficulty with gait.  Patient notes that he has had increasing sadness in his life since he recently separated with his girlfriend.  He reports that he has had increasing thoughts of harming himself and "cannot live like this anymore". The patient does not have a plan. He denies any drug use recently but notes he uses marijuana occasionally. No HI. Patient is not on anticoagulation medication.   HPI  Past Medical History:  Diagnosis Date  . Alcohol abuse   . Hypertension     Patient Active Problem List   Diagnosis Date Noted  . Adjustment disorder with depressed mood 07/09/2017  . Polysubstance abuse (HCC) 07/09/2017  . Prolonged Q-T interval on ECG   . Suicidal thoughts   . Drug overdose 07/08/2017  . Acute GI bleeding 07/08/2017  . Depression 07/08/2017  . Alcohol dependence with uncomplicated withdrawal (HCC) 12/30/2016  . Major depressive disorder, recurrent severe without  psychotic features (HCC) 12/30/2016  . Chest pain 10/28/2015  . Hypertension 10/28/2015  . Alcohol abuse 10/28/2015  . Hyperglycemia 10/28/2015  . Alcohol withdrawal (HCC) 10/28/2015  . Tobacco abuse 10/28/2015  . Chest pain at rest 10/28/2015  . HTN (hypertension), benign     Past Surgical History:  Procedure Laterality Date  . COLONOSCOPY    . MOUTH SURGERY    . NO PAST SURGERIES          Home Medications    Prior to Admission medications   Medication Sig Start Date End Date Taking? Authorizing Provider  aspirin-sod bicarb-citric acid (ALKA-SELTZER) 325 MG TBEF tablet Take 155 mg by mouth 2 (two) times daily as needed (acid reflux,headache).    [provider]  FLUoxetine (PROZAC) 20 MG capsule Take 60 mg by mouth daily.  06/18/17   [provider]  folic acid (FOLVITE) 1 MG tablet Take 1 tablet (1 mg total) by mouth daily. 07/11/17   Glade Lloyd, MD  gabapentin (NEURONTIN) 300 MG capsule Take 1 capsule (300 mg total) by mouth 2 (two) times daily. For agitation 01/03/17   Money, Gerlene Burdock, FNP  lisinopril (PRINIVIL,ZESTRIL) 20 MG tablet Take 1 tablet (20 mg total) by mouth daily. 07/11/17   Glade Lloyd, MD  Multiple Vitamin (MULTIVITAMIN WITH MINERALS) TABS tablet Take 1 tablet by mouth daily. 07/11/17   Glade Lloyd, MD  pantoprazole (PROTONIX) 40 MG tablet Take 1 tablet (40 mg total) by mouth daily. 07/11/17  Glade Lloyd, MD  risperiDONE (RISPERDAL) 0.5 MG tablet Take 0.5 mg by mouth 2 (two) times daily. 06/18/17   [provider]  thiamine 100 MG tablet Take 1 tablet (100 mg total) by mouth daily. 07/11/17   Glade Lloyd, MD    Family History Family History  Problem Relation Age of Onset  . Heart attack Paternal Grandfather   . Stroke Mother   . Heart failure Mother   . Heart failure Father     Social History Social History   Tobacco Use  . Smoking status: Current Some Day Smoker    Packs/day: 2.00    Types: Cigarettes  .  Smokeless tobacco: Never Used  Substance Use Topics  . Alcohol use: Yes    Comment: daily  . Drug use: Yes    Types: Marijuana     Allergies   Penicillins and Statins   Review of Systems Review of Systems  All other systems reviewed and are negative.    Physical Exam Updated Vital Signs BP 121/87 (BP Location: Left Arm)   Pulse 69   Temp 98.3 F (36.8 C) (Oral)   Resp 18   SpO2 93%   Physical Exam  Constitutional: He appears well-developed and well-nourished.  Disheveled  HENT:  Head: Normocephalic. Head is with abrasion. Head is without raccoon's eyes and without Battle's sign.    Right Ear: External ear normal.  Left Ear: External ear normal.  Nose: Nose normal.  Mouth/Throat: Uvula is midline, oropharynx is clear and moist and mucous membranes are normal. No tonsillar exudate.  Eyes: Pupils are equal, round, and reactive to light. Right eye exhibits no discharge. Left eye exhibits no discharge. No scleral icterus.  Neck: Trachea normal. Neck supple. No spinous process tenderness present. No neck rigidity. Normal range of motion present.  Patient with ttp and level of C5. No step offs noted.   Cardiovascular: Normal rate, regular rhythm and intact distal pulses.  No murmur heard. Pulses:      Radial pulses are 2+ on the right side, and 2+ on the left side.       Dorsalis pedis pulses are 2+ on the right side, and 2+ on the left side.       Posterior tibial pulses are 2+ on the right side, and 2+ on the left side.  No lower extremity swelling or edema. Calves symmetric in size bilaterally.  Pulmonary/Chest: Effort normal and breath sounds normal. He exhibits no tenderness.  Abdominal: Soft. Bowel sounds are normal. There is no tenderness. There is no rebound and no guarding.  Musculoskeletal: He exhibits no edema.  Lymphadenopathy:    He has no cervical adenopathy.  Neurological: He is alert.  Follows commands. No facial droop. PERRLA. EOM grossly intact. CN  III-XII grossly intact. Grossly moves all extremities 4 without ataxia. Able and appropriate strength for age to upper and lower extremities bilaterally.   Skin: Skin is warm and dry. No rash noted. He is not diaphoretic.  Psychiatric: He has a normal mood and affect.  Nursing note and vitals reviewed.    ED Treatments / Results  Labs (all labs ordered are listed, but only abnormal results are displayed) Labs Reviewed  COMPREHENSIVE METABOLIC PANEL - Abnormal; Notable for the following components:      Result Value   Potassium 3.3 (*)    BUN 5 (*)    All other components within normal limits  ETHANOL - Abnormal; Notable for the following components:   Alcohol, Ethyl (B)  165 (*)    All other components within normal limits  ACETAMINOPHEN LEVEL - Abnormal; Notable for the following components:   Acetaminophen (Tylenol), Serum <10 (*)    All other components within normal limits  CBC WITH DIFFERENTIAL/PLATELET - Abnormal; Notable for the following components:   MCHC 36.5 (*)    All other components within normal limits  RAPID URINE DRUG SCREEN, HOSP PERFORMED - Abnormal; Notable for the following components:   Benzodiazepines POSITIVE (*)    All other components within normal limits  SALICYLATE LEVEL    EKG None  Radiology Ct Head Wo Contrast  Result Date: 07/21/2017 CLINICAL DATA:  Fall with head trauma EXAM: CT HEAD WITHOUT CONTRAST CT CERVICAL SPINE WITHOUT CONTRAST TECHNIQUE: Multidetector CT imaging of the head and cervical spine was performed following the standard protocol without intravenous contrast. Multiplanar CT image reconstructions of the cervical spine were also generated. COMPARISON:  Head CT 12/29/2016 FINDINGS: CT HEAD FINDINGS Brain: No mass lesion, intraparenchymal hemorrhage or extra-axial collection. No evidence of acute cortical infarct. Atrophy greater than expected for age. Vascular: No hyperdense vessel or unexpected calcification. Skull: Normal visualized  skull base, calvarium and extracranial soft tissues. Sinuses/Orbits: No sinus fluid levels or advanced mucosal thickening. No mastoid effusion. Normal orbits. CT CERVICAL SPINE FINDINGS Alignment: No static subluxation. Facets are aligned. Occipital condyles are normally positioned. Skull base and vertebrae: Seen on the coronal and sagittal reformats only, there is a linear defect within the C5 posterior elements, predominantly involving the lamina and spinous process. There is no involvement of the articular processes. Soft tissues and spinal canal: No prevertebral fluid or swelling. No visible canal hematoma. Disc levels: Fusion of the right C4-5 facets. This severely narrows the right neural foramen at this level. Upper chest: No pneumothorax, pulmonary nodule or pleural effusion. Other: Normal visualized paraspinal cervical soft tissues. IMPRESSION: 1. Parenchymal atrophy greater than expected for age. No acute intracranial abnormality. 2. Minimally displaced fracture of the C5 spinous process and left lamina. No other acute fracture of the cervical spine. 3. No static subluxation of the cervical spine. Electronically Signed   By: Deatra Robinson M.D.   On: 07/21/2017 19:06   Ct Cervical Spine Wo Contrast  Result Date: 07/21/2017 CLINICAL DATA:  Fall with head trauma EXAM: CT HEAD WITHOUT CONTRAST CT CERVICAL SPINE WITHOUT CONTRAST TECHNIQUE: Multidetector CT imaging of the head and cervical spine was performed following the standard protocol without intravenous contrast. Multiplanar CT image reconstructions of the cervical spine were also generated. COMPARISON:  Head CT 12/29/2016 FINDINGS: CT HEAD FINDINGS Brain: No mass lesion, intraparenchymal hemorrhage or extra-axial collection. No evidence of acute cortical infarct. Atrophy greater than expected for age. Vascular: No hyperdense vessel or unexpected calcification. Skull: Normal visualized skull base, calvarium and extracranial soft tissues.  Sinuses/Orbits: No sinus fluid levels or advanced mucosal thickening. No mastoid effusion. Normal orbits. CT CERVICAL SPINE FINDINGS Alignment: No static subluxation. Facets are aligned. Occipital condyles are normally positioned. Skull base and vertebrae: Seen on the coronal and sagittal reformats only, there is a linear defect within the C5 posterior elements, predominantly involving the lamina and spinous process. There is no involvement of the articular processes. Soft tissues and spinal canal: No prevertebral fluid or swelling. No visible canal hematoma. Disc levels: Fusion of the right C4-5 facets. This severely narrows the right neural foramen at this level. Upper chest: No pneumothorax, pulmonary nodule or pleural effusion. Other: Normal visualized paraspinal cervical soft tissues. IMPRESSION: 1. Parenchymal atrophy greater than  expected for age. No acute intracranial abnormality. 2. Minimally displaced fracture of the C5 spinous process and left lamina. No other acute fracture of the cervical spine. 3. No static subluxation of the cervical spine. Electronically Signed   By: Deatra Robinson M.D.   On: 07/21/2017 19:06    Procedures Procedures (including critical care time)  Medications Ordered in ED Medications  LORazepam (ATIVAN) injection 0-4 mg ( Intravenous See Alternative 07/21/17 2034)    Or  LORazepam (ATIVAN) tablet 0-4 mg (1 mg Oral Given 07/21/17 2034)  thiamine (VITAMIN B-1) tablet 100 mg (100 mg Oral Given 07/21/17 2034)    Or  thiamine (B-1) injection 100 mg ( Intravenous See Alternative 07/21/17 2034)  ibuprofen (ADVIL,MOTRIN) tablet 600 mg (600 mg Oral Given 07/21/17 2034)  Tdap (BOOSTRIX) injection 0.5 mL (has no administration in time range)  multivitamin with minerals tablet 1 tablet (1 tablet Oral Given 07/21/17 2034)  potassium chloride SA (K-DUR,KLOR-CON) CR tablet 20 mEq (20 mEq Oral Given 07/21/17 2034)     Initial Impression / Assessment and Plan / ED Course  I have  reviewed the triage vital signs and the nursing notes.  Pertinent labs & imaging results that were available during my care of the patient were reviewed by me and considered in my medical decision making (see chart for details).     57 year old male with a history of alcohol abuse who presents the emergency department today for follow-up after he fell yesterday and hit the back of his head after drinking.  Patient notes that he has had a constant headache since the event.  He has continued to drink alcohol.  Patient is noted to have an abrasion to the posterior scalp.  Tetanus updated.  Patient able to move all extremities without difficulty.  He has stable gait.  Does have some tenderness palpation of the cervical spine at level of C5.  No step-offs noted.  No bowel/bladder incontinence, saddle anesthesia or urinary retention.  No concern for cauda equina.  CT of the head without any acute abnormality.  Patient does have a displaced fracture of the C5 spinous process and left lamina.  Patient placed in c-collar for comfort.  States his pain is currently controlled.  Patient also states that he has had thoughts of harming himself.  This began recently after he separated with his girlfriend.  He notes that he has been drinking more heavily over the last several days.  Patient was watched in the department and until clinically sober.  His blood work was overall reassuring.  His potassium was replaced orally.  Patient started on CIWA protocol.  Multivitamin and thiamine given.  TTS consulted.  With TTS consult pending, case signed out to [employer], PA-C.   Patient case discussed with Dr. Freida Busman who is in agreement with plan.  Final Clinical Impressions(s) / ED Diagnoses   Final diagnoses:  Closed fracture of spinous process of cervical vertebra, initial encounter (HCC)  Alcohol abuse  Fall in home, initial encounter  Encounter for medical clearance for patient hold    ED Discharge Orders     None       Princella Pellegrini 07/22/17 0003    Lorre Nick, MD 07/22/17 1705

## 2017-07-21 NOTE — ED Notes (Signed)
Pt ambulatory to restroom

## 2017-07-22 DIAGNOSIS — F1014 Alcohol abuse with alcohol-induced mood disorder: Secondary | ICD-10-CM

## 2017-07-22 NOTE — [facility] Suicide Risk Assessment (Cosign Needed Addendum)
Suicide Risk Assessment  Discharge Assessment   [facility] Discharge Suicide Risk Assessment   Principal Problem: Alcohol abuse with alcohol-induced mood disorder [facility]) Discharge Diagnoses:  Patient Active Problem List   Diagnosis Date Noted  . Alcohol abuse with alcohol-induced mood disorder (HCC) [F10.14] 07/22/2017  . Adjustment disorder with depressed mood [F43.21] 07/09/2017  . Polysubstance abuse (HCC) [F19.10] 07/09/2017  . Prolonged Q-T interval on ECG [R94.31]   . Suicidal thoughts [R45.851]   . Drug overdose [T50.901A] 07/08/2017  . Acute GI bleeding [K92.2] 07/08/2017  . Depression [F32.9] 07/08/2017  . Alcohol dependence with uncomplicated withdrawal (HCC) [F10.230] 12/30/2016  . Major depressive disorder, recurrent severe without psychotic features (HCC) [F33.2] 12/30/2016  . Chest pain [R07.9] 10/28/2015  . Hypertension [I10] 10/28/2015  . Alcohol abuse [F10.10] 10/28/2015  . Hyperglycemia [R73.9] 10/28/2015  . Alcohol withdrawal (HCC) [F10.239] 10/28/2015  . Tobacco abuse [Z72.0] 10/28/2015  . Chest pain at rest [R07.9] 10/28/2015  . HTN (hypertension), benign [I10]    Pt was seen and chart reviewed with Dr Jannifer Franklin. Pt has a long history of alcohol abuse and stated he has been drinking heavily since he and his girlfriend broke up. Pt completed substance abuse treatment program at [facility] last fall and stayed sober for 2 months. Pt's UDS positive for Benzos (hospital given), BAL 165. Pt wants to be seen by Peer Support for assistance getting back into a rehab program.  Pt denies suicidal/homicidal ideation, denies auditory/visual hallucinations and does not appear to be responding to internal stimuli. Pt is stable and psychiatrically clear for discharge.   Total Time spent with patient: 30 minutes  Musculoskeletal: Strength & Muscle Tone: within normal limits Gait & Station: normal Patient leans: N/A  Psychiatric Specialty Exam:   Blood pressure (!) 180/99, pulse 93,  temperature 98.3 F (36.8 C), temperature source Oral, resp. rate 18, SpO2 94 %.There is no height or weight on file to calculate BMI.  General Appearance: Casual  Eye Contact::  Good  Speech:  Clear and Coherent and Normal Rate409  Volume:  Normal  Mood:  Depressed  Affect:  Congruent and Depressed  Thought Process:  Coherent and Linear  Orientation:  Full (Time, Place, and Person)  Thought Content:  Logical  Suicidal Thoughts:  No  Homicidal Thoughts:  No  Memory:  Immediate;   Good Recent;   Good Remote;   Fair  Judgement:  Fair  Insight:  Fair  Psychomotor Activity:  Normal  Concentration:  Fair  Recall:  [employer] of Knowledge:Good  Language: Good  Akathisia:  No  Handed:  Right  AIMS (if indicated):     Assets:  Architect Housing  Sleep:     Cognition: WNL  ADL's:  Intact   Mental Status Per Nursing Assessment::   On Admission:     Demographic Factors:  Male, Caucasian, Living alone and Unemployed  Loss Factors: Legal issues and Financial problems/change in socioeconomic status  Historical Factors: Impulsivity  Risk Reduction Factors:   Sense of responsibility to family  Continued Clinical Symptoms:  Depression:   Comorbid alcohol abuse/dependence Impulsivity Alcohol/Substance Abuse/Dependencies  Cognitive Features That Contribute To Risk:  Closed-mindedness    Suicide Risk:  Minimal: No identifiable suicidal ideation.  Patients presenting with no risk factors but with morbid ruminations; may be classified as minimal risk based on the severity of the depressive symptoms    Plan Of Care/Follow-up recommendations:  Activity:  as tolerated Diet:  Heart healthy  Laveda Abbe,  NP 07/22/2017, 12:46 PM

## 2017-07-22 NOTE — BH Assessment (Addendum)
Assessment Note  Zachary [name] is an 57 y.o. male who presents to the ED voluntarily. Pt reports increased alcohol abuse and passive SI. Pt states he had thoughts of cutting his wrists yesterday. Pt states he got into an argument with his fiance' last weekend and they broke up. Pt states his ex-fiance' pressed charges against him and he now has to go to court for the incident. Pt states his rent is due and he does not have the funds to pay his rent. Pt states he was intoxicated and fell last week and is now wearing a neck brace. Pt states he feels helpless and he is tired of living this way.  Pt was recently assessed by TTS on 07/14/17 and 07/08/17 c/o similar concerns. Pt reports a hx of childhood trauma as he states his parents lied to him throughout his life about having a cleft lip and called him "disobedient" whenever he would ask about it. Pt states his parents lied to him and told him that he fell and cut his lip and he did not find out the truth about being born with a cleft lip until his sister was dying from cancer.   Pt often comes to the ED due to alcohol intoxication but states he does not follow up with any OPT providers upon d/c.    TTS consulted with Malachy Chamber, NP who recommends continued observation for safety and stabilization and to be reassessed in the AM by psych. EDP Elpidio Anis, PA-C and pt's nurse [facility], RN have been advised of the disposition.  Diagnosis: MDD, single episode, severe, w/o psychosis; Alcohol use disorder, severe  Past Medical History:  Past Medical History:  Diagnosis Date  . Alcohol abuse   . Hypertension     Past Surgical History:  Procedure Laterality Date  . COLONOSCOPY    . MOUTH SURGERY    . NO PAST SURGERIES      Family History:  Family History  Problem Relation Age of Onset  . Heart attack Paternal Grandfather   . Stroke Mother   . Heart failure Mother   . Heart failure Father     Social History:  reports that he has been  smoking cigarettes.  He has been smoking about 2.00 packs per day. He has never used smokeless tobacco. He reports that he drinks alcohol. He reports that he has current or past drug history. Drug: Marijuana.  Additional Social History:  Alcohol / Drug Use Pain Medications: See MAR Prescriptions: See MAR Over the Counter: See MAR History of alcohol / drug use?: Yes Longest period of sobriety (when/how long): none reported Negative Consequences of Use: Financial, Personal relationships, Work / School Withdrawal Symptoms: Sweats, Diarrhea, Tremors, Nausea / Vomiting, Fever / Chills Substance #1 Name of Substance 1: Alcohol 1 - Age of First Use: 11 1 - Amount (size/oz): binge drinking up to 2 fifths of vodka  1 - Frequency: daily 1 - Duration: ongoing 1 - Last Use / Amount: 07/21/17 Substance #2 Name of Substance 2: Cannabis 2 - Age of First Use: teens 2 - Amount (size/oz): varies 2 - Frequency: occasional 2 - Duration: ongoing 2 - Last Use / Amount: 07/21/17  CIWA: CIWA-Ar BP: (!) 150/81 Pulse Rate: (!) 59 Nausea and Vomiting: mild nausea with no vomiting Tactile Disturbances: none Tremor: not visible, but can be felt fingertip to fingertip Auditory Disturbances: not present Paroxysmal Sweats: no sweat visible Visual Disturbances: not present Anxiety: no anxiety, at ease Headache, Fullness in Head:  severe Agitation: normal activity Orientation and Clouding of Sensorium: oriented and can do serial additions CIWA-Ar Total: 7 COWS:    Allergies:  Allergies  Allergen Reactions  . Penicillins Other (See Comments)    Childhood allergy Reaction:  Unknown  Has patient had a PCN reaction causing immediate rash, facial/tongue/throat swelling, SOB or lightheadedness with hypotension: Unsure Has patient had a PCN reaction causing severe rash involving mucus membranes or skin necrosis: Unsure Has patient had a PCN reaction that required hospitalization Unsure Has patient had a PCN  reaction occurring within the last 10 years: No If all of the above answers are "NO", then may proceed with Cephalosporin use.  . Statins Palpitations    Home Medications:  (Not in a hospital admission)  OB/GYN Status:  No LMP for male patient.  General Assessment Data Assessment unable to be completed: Yes Reason for not completing assessment: TTS to assess pt once he is in a private room. Pt is in the hallway. No private rooms available at this time. EDP Maczis, Elmer Sow, PA-C notified TTS cannot complete the assessment for the pt in the hallway. Will have RN call back when the pt is in a private room and ready to be seen. Location of Assessment: [facility] TTS Assessment: In system Is this a Tele or Face-to-Face Assessment?: Face-to-Face Is this an Initial Assessment or a Re-assessment for this encounter?: Initial Assessment Marital status: Single Is patient pregnant?: No Pregnancy Status: No Living Arrangements: Alone Can pt return to current living arrangement?: Yes Admission Status: Voluntary Is patient capable of signing voluntary admission?: Yes Referral Source: Self/Family/Friend Insurance type: MEDICAID POTENTIAL     Crisis Care Plan Living Arrangements: Alone Name of Psychiatrist: none currently Name of Therapist: none currently  Education Status Is patient currently in school?: No Is the patient employed, unemployed or receiving disability?: Unemployed  Risk to self with the past 6 months Suicidal Ideation: Yes-Currently Present Has patient been a risk to self within the past 6 months prior to admission? : No Suicidal Intent: No Has patient had any suicidal intent within the past 6 months prior to admission? : No Is patient at risk for suicide?: Yes Suicidal Plan?: Yes-Currently Present Has patient had any suicidal plan within the past 6 months prior to admission? : Yes Specify Current Suicidal Plan: pt states he thought of cutting his wrists yesterday Access to  Means: Yes Specify Access to Suicidal Means: pt has access to knives What has been your use of drugs/alcohol within the last 12 months?: reports to heavy alcohol use and occasional cannabis  Previous Attempts/Gestures: No Triggers for Past Attempts: None known Intentional Self Injurious Behavior: None Family Suicide History: No Recent stressful life event(s): Conflict (Comment), Financial Problems, Legal Issues(broke up with fiance') Persecutory voices/beliefs?: No Depression: Yes Depression Symptoms: Insomnia, Feeling worthless/self pity, Feeling angry/irritable, Loss of interest in usual pleasures, Fatigue, Guilt Substance abuse history and/or treatment for substance abuse?: Yes Suicide prevention information given to non-admitted patients: Not applicable  Risk to Others within the past 6 months Homicidal Ideation: No Does patient have any lifetime risk of violence toward others beyond the six months prior to admission? : No Thoughts of Harm to Others: No Current Homicidal Intent: No Current Homicidal Plan: No Access to Homicidal Means: No History of harm to others?: No Assessment of Violence: None Noted Does patient have access to weapons?: Yes (Comment)(pellet gun ) Criminal Charges Pending?: Yes Describe Pending Criminal Charges: assault on male Does patient have a court  date: Yes Court Date: 07/31/17 Is patient on probation?: No  Psychosis Hallucinations: None noted Delusions: None noted  Mental Status Report Appearance/Hygiene: Disheveled Eye Contact: Poor Motor Activity: Unsteady Speech: Slurred Level of Consciousness: Quiet/awake, Drowsy Mood: Depressed, Helpless, Worthless, low self-esteem Affect: Depressed, Flat Anxiety Level: None Thought Processes: Relevant, Coherent Judgement: Impaired Orientation: Person, Place, Time, Situation, Appropriate for developmental age Obsessive Compulsive Thoughts/Behaviors: None  Cognitive Functioning Concentration:  Normal Memory: Remote Intact, Recent Intact Is patient IDD: No Is patient DD?: No Insight: Poor Impulse Control: Poor Appetite: Fair Have you had any weight changes? : No Change Sleep: Decreased Total Hours of Sleep: 3 Vegetative Symptoms: None  ADLScreening [facility]) Patient's cognitive ability adequate to safely complete daily activities?: Yes Patient able to express need for assistance with ADLs?: Yes Independently performs ADLs?: Yes (appropriate for developmental age)  Prior Inpatient Therapy Prior Inpatient Therapy: Yes Prior Therapy Dates: 2018, 2008 Prior Therapy Facilty/Provider(s): [facility], [facility], [place] Reason for Treatment: ALCOHOL DEPENDENCE   Prior Outpatient Therapy Prior Outpatient Therapy: No Does patient have an ACCT team?: No Does patient have Intensive In-House Services?  : No Does patient have [facility] services? : No Does patient have P4[facility] services?: No  ADL Screening (condition at time of admission) Patient's cognitive ability adequate to safely complete daily activities?: Yes Is the patient deaf or have difficulty hearing?: No Does the patient have difficulty seeing, even when wearing glasses/contacts?: No Does the patient have difficulty concentrating, remembering, or making decisions?: No Patient able to express need for assistance with ADLs?: Yes Does the patient have difficulty dressing or bathing?: No Independently performs ADLs?: Yes (appropriate for developmental age) Does the patient have difficulty walking or climbing stairs?: No Weakness of Legs: None Weakness of Arms/Hands: None  Home Assistive Devices/Equipment Home Assistive Devices/Equipment: Brace (specify type)    Abuse/Neglect Assessment (Assessment to be complete while patient is alone) Abuse/Neglect Assessment Can Be Completed: Yes Physical Abuse: Denies Verbal Abuse: Yes, past (Comment)(childhood) Sexual Abuse: Denies Exploitation of patient/patient's resources:  Denies Self-Neglect: Denies     Merchant navy officer (For Healthcare) Does Patient Have a Medical Advance Directive?: No Would patient like information on creating a medical advance directive?: No - Patient declined    Additional Information 1:1 In Past 12 Months?: No CIRT Risk: No Elopement Risk: No Does patient have medical clearance?: Yes     Disposition: TTS consulted with Malachy Chamber, NP who recommends continued observation for safety and stabilization and to be reassessed in the AM by psych. EDP Elpidio Anis, PA-C and pt's nurse [facility], RN have been advised of the disposition. Disposition Initial Assessment Completed for this Encounter: Yes Disposition of Patient: (overnight OBS pending AM psych eval) Patient refused recommended treatment: No  On Site Evaluation by:   Reviewed with Physician:    Karolee Ohs 07/22/2017 2:59 AM

## 2017-07-22 NOTE — Discharge Instructions (Signed)
Follow up with Peer Support specialist in the community for assistance with entry into a substance abuse program.   Follow-up Information    [facility] Follow up.   Specialty:  Addiction Medicine   Contact information: [address] [place] [address] [phone]        Services, [facility] Follow up.     Contact information: [address] [place] [address] [phone]        [facility] Follow up.   Specialty:  Behavioral Health Why:  Walk in within 3 days of hospital/rehab discharge to be assessed for outpatient mental health services including: medication management, counseling, and group therapy. Walk in hours: Mon-Fri 8am-9am. Thank you.  Contact information: [address] [place] [address] [phone]

## 2017-07-22 NOTE — Patient Outreach (Signed)
[facility] met with the patient and provided substance use recovery support. Patient plans to go to [facility] for residential treatment. Patient states that he has family supports for his substance use recovery and will help with getting admitted to [facility]. CPSS also provided other substance use recovery resources including residential/outpatient treatment list, AA meeting list, and [facility]PSS contact information. CPSS encouraged the patient to go to recovery support group meeting like AA for further substance use recovery support while waiting to get admitted to [facility]. CPSS strongly encouraged the patient to contact [facility] for continued substance use recovery support or help with recovery resources.

## 2017-07-22 NOTE — Progress Notes (Signed)
TTS spoke with Charge nurse Misty Stanley, RN who states they are placing the pt in a private room for the assessment.  Princess Bruins, MSW, LCSW Therapeutic Triage Specialist  [phone]

## 2017-07-22 NOTE — ED Notes (Signed)
TTS is in speaking with pt

## 2017-07-22 NOTE — Progress Notes (Signed)
TTS consulted with Malachy Chamber, NP who recommends continued observation for safety and stabilization and to be reassessed in the AM by psych. EDP Elpidio Anis, PA-C and pt's nurse [facility], RN have been advised of the disposition.  Princess Bruins, MSW, LCSW Therapeutic Triage Specialist  [phone]

## 2017-07-23 ENCOUNTER — Encounter (HOSPITAL_COMMUNITY): Payer: Self-pay

## 2017-07-23 ENCOUNTER — Emergency Department (HOSPITAL_COMMUNITY)
Admission: EM | Admit: 2017-07-23 | Discharge: 2017-07-24 | Disposition: A | Discharge status: Home / Self Care | Payer: Self-pay | Attending: Emergency Medicine | Admitting: Emergency Medicine

## 2017-07-23 ENCOUNTER — Other Ambulatory Visit: Payer: Self-pay

## 2017-07-23 DIAGNOSIS — T43592A Poisoning by other antipsychotics and neuroleptics, intentional self-harm, initial encounter: Secondary | ICD-10-CM | POA: Insufficient documentation

## 2017-07-23 DIAGNOSIS — I10 Essential (primary) hypertension: Secondary | ICD-10-CM | POA: Insufficient documentation

## 2017-07-23 DIAGNOSIS — T50902A Poisoning by unspecified drugs, medicaments and biological substances, intentional self-harm, initial encounter: Secondary | ICD-10-CM

## 2017-07-23 DIAGNOSIS — F10230 Alcohol dependence with withdrawal, uncomplicated: Secondary | ICD-10-CM | POA: Diagnosis present

## 2017-07-23 DIAGNOSIS — Z79899 Other long term (current) drug therapy: Secondary | ICD-10-CM | POA: Insufficient documentation

## 2017-07-23 DIAGNOSIS — F332 Major depressive disorder, recurrent severe without psychotic features: Secondary | ICD-10-CM | POA: Insufficient documentation

## 2017-07-23 DIAGNOSIS — R45851 Suicidal ideations: Secondary | ICD-10-CM | POA: Insufficient documentation

## 2017-07-23 DIAGNOSIS — F17210 Nicotine dependence, cigarettes, uncomplicated: Secondary | ICD-10-CM | POA: Insufficient documentation

## 2017-07-23 LAB — RAPID URINE DRUG SCREEN, HOSP PERFORMED
AMPHETAMINES: NOT DETECTED
BENZODIAZEPINES: POSITIVE — AB
Barbiturates: NOT DETECTED
COCAINE: NOT DETECTED
OPIATES: NOT DETECTED
TETRAHYDROCANNABINOL: NOT DETECTED

## 2017-07-23 LAB — COMPREHENSIVE METABOLIC PANEL
ALK PHOS: 99 U/L (ref 38–126)
ALT: 52 U/L (ref 17–63)
AST: 29 U/L (ref 15–41)
Albumin: 4 g/dL (ref 3.5–5.0)
Anion gap: 13 (ref 5–15)
BILIRUBIN TOTAL: 0.5 mg/dL (ref 0.3–1.2)
BUN: <5 mg/dL — ABNORMAL LOW (ref 6–20)
CALCIUM: 8.9 mg/dL (ref 8.9–10.3)
CO2: 23 mmol/L (ref 22–32)
Chloride: 103 mmol/L (ref 101–111)
Creatinine, Ser: 0.66 mg/dL (ref 0.61–1.24)
GFR calc non Af Amer: >60 mL/min (ref >60)
Glucose, Bld: 94 mg/dL (ref 65–99)
Potassium: 3.4 mmol/L — ABNORMAL LOW (ref 3.5–5.1)
SODIUM: 139 mmol/L (ref 135–145)
TOTAL PROTEIN: 6.6 g/dL (ref 6.5–8.1)

## 2017-07-23 LAB — CBC
HCT: 39.7 % (ref 39.0–52.0)
Hemoglobin: 14.7 g/dL (ref 13.0–17.0)
MCH: 33.3 pg (ref 26.0–34.0)
MCHC: 37 g/dL — ABNORMAL HIGH (ref 30.0–36.0)
MCV: 89.8 fL (ref 78.0–100.0)
Platelets: 177 10*3/uL (ref 150–400)
RBC: 4.42 MIL/uL (ref 4.22–5.81)
WBC: 6.2 10*3/uL (ref 4.0–10.5)

## 2017-07-23 LAB — CBG MONITORING, ED: Glucose-Capillary: 88 mg/dL (ref 65–99)

## 2017-07-23 LAB — ACETAMINOPHEN LEVEL: Acetaminophen (Tylenol), Serum: <10 ug/mL — ABNORMAL LOW (ref 10–30)

## 2017-07-23 LAB — ETHANOL: ALCOHOL ETHYL (B): 178 mg/dL — AB (ref <10)

## 2017-07-23 LAB — SALICYLATE LEVEL

## 2017-07-23 NOTE — ED Notes (Signed)
Patient ambulated to the restroom with assistance of 2 NT's.

## 2017-07-23 NOTE — ED Notes (Signed)
Poison Control called, no further recommendations.

## 2017-07-23 NOTE — ED Notes (Signed)
Bed: RESA Expected date: 07/23/17 Expected time: 5:58 PM Means of arrival: Ambulance Comments: SI drugs and Etoh

## 2017-07-23 NOTE — ED Triage Notes (Signed)
Pt comes from home. Pt called 911 threatening to kill himself. Pt threatened to cut himself and stated he wanted to die. Upon arrival of [facility] pt stated he took Risperdal and Prozac of unknown amounts and also admits to drinking ETOH. Pt stated that girlfriend dumped pt and that is why pt wants to kill himself. Pt is currently AOx4 however very sedated.

## 2017-07-23 NOTE — ED Notes (Signed)
Poison control has been called. Poison control advised that pt will need to go under 6H observation along with Monitoring Q-T interval, Acetaminophen level and salycitate level. Monitor pt for excessive sleepiness or Tachycardia.

## 2017-07-24 LAB — ACETAMINOPHEN LEVEL: Acetaminophen (Tylenol), Serum: <10 ug/mL — ABNORMAL LOW (ref 10–30)

## 2017-07-24 LAB — SALICYLATE LEVEL: Salicylate Lvl: <7 mg/dL (ref 2.8–30.0)

## 2017-07-24 NOTE — Discharge Instructions (Signed)
For your behavioral health needs, you are advised to follow up with [facility] Recovery Services:       [facility]      [address] [place], [place] [address]      [phone]

## 2017-07-24 NOTE — BHH Suicide Risk Assessment (Cosign Needed)
Suicide Risk Assessment  Discharge Assessment   [facility] Discharge Suicide Risk Assessment   Principal Problem: Alcohol dependence with uncomplicated withdrawal [facility]) Discharge Diagnoses:  Patient Active Problem List   Diagnosis Date Noted  . Alcohol dependence with uncomplicated withdrawal (HCC) [F10.230] 12/30/2016    Priority: High  . Alcohol abuse with alcohol-induced mood disorder (HCC) [F10.14] 07/22/2017  . Adjustment disorder with depressed mood [F43.21] 07/09/2017  . Polysubstance abuse (HCC) [F19.10] 07/09/2017  . Prolonged Q-T interval on ECG [R94.31]   . Suicidal thoughts [R45.851]   . Drug overdose [T50.901A] 07/08/2017  . Acute GI bleeding [K92.2] 07/08/2017  . Depression [F32.9] 07/08/2017  . Major depressive disorder, recurrent severe without psychotic features (HCC) [F33.2] 12/30/2016  . Chest pain [R07.9] 10/28/2015  . Hypertension [I10] 10/28/2015  . Alcohol abuse [F10.10] 10/28/2015  . Hyperglycemia [R73.9] 10/28/2015  . Alcohol withdrawal (HCC) [F10.239] 10/28/2015  . Tobacco abuse [Z72.0] 10/28/2015  . Chest pain at rest [R07.9] 10/28/2015  . HTN (hypertension), benign [I10]    Pt was seen and chart reviewed with treatment team and Dr Sharma Covert. Pt denies suicidal/homicidal ideation, denies auditory/visual hallucinations and does not appear to be responding to internal stimuli. Pt stated he has a long history of alcohol abuse and knows he needs to get help. 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 Pt has plans to go to [facility] as soon as he is discharged. Pt stated he has lost his hold on his life and just wants to get sober. Pt is able to contract for safety and is psychiatrically clear for discharge.   Total Time spent with patient: 30 minutes  Musculoskeletal: Strength & Muscle Tone: within normal limits Gait & Station: normal Patient leans: N/A  Psychiatric Specialty Exam:   Blood pressure (!)  161/84, pulse 86, resp. rate 17, height 6' (1.829 m), weight 190 lb (86.2 kg), SpO2 95 %.Body mass index is 25.77 kg/m.  General Appearance: Casual  Eye Contact::  Good  Speech:  Clear and Coherent and Normal Rate409  Volume:  Normal  Mood:  Depressed  Affect:  Congruent and Depressed  Thought Process:  Coherent, Goal Directed and Linear  Orientation:  Full (Time, Place, and Person)  Thought Content:  Logical  Suicidal Thoughts:  No  Homicidal Thoughts:  No  Memory:  Immediate;   Good Recent;   Good Remote;   Fair  Judgement:  Fair  Insight:  Fair  Psychomotor Activity:  Normal  Concentration:  Good  Recall:  Good  Fund of Knowledge:Good  Language: Good  Akathisia:  No  Handed:  Right  AIMS (if indicated):     Assets:  Communication Skills Desire for Improvement Financial Resources/Insurance Housing Social Support Transportation Vocational/Educational  Sleep:     Cognition: WNL  ADL's:  Intact   Mental Status Per Nursing Assessment::   On Admission:   Intoxicated  Demographic Factors:  Male, Caucasian and Living alone  Loss Factors: Legal issues  Historical Factors: Impulsivity  Risk Reduction Factors:   Sense of responsibility to family  Continued Clinical Symptoms:  Depression:   Impulsivity Alcohol/Substance Abuse/Dependencies  Cognitive Features That Contribute To Risk:  Closed-mindedness    Suicide Risk:  Minimal: No identifiable suicidal ideation.  Patients presenting with no risk factors but with morbid ruminations; may be classified as minimal risk based on the severity of the depressive symptoms    Plan Of Care/Follow-up recommendations:  Activity:  as tolerated  Diet:  Heart Healthy  Laveda Abbe, NP 07/24/2017, 11:04 AM

## 2017-07-24 NOTE — BH Assessment (Addendum)
Assessment Note  Zachary [name] is an 57 y.o. male who presents to the ED voluntarily. Pt reports he intentionally ingested an unknown amount of Rispiridone and prozac mixed with alcohol in a suicide attempt. Pt states he called 911 and told them that he was going to cut himself with his pocket knife. Pt states his son was at his home helping him clean the kitchen and his son became upset when the pt left to go purchase alcohol. Pt stated "I can't help it. I can't stop it." Pt states he drinks up to a fifth of alcohol daily. Pt's current BAL is 178 on arrival to ED. Pt states he got into an argument with his ex-fiance' weeks ago and she moved out and pressed charges against him. Pt states he has not spoken to her since the incident and he now has a 50-B taken out against him due to the incident with his ex-fiance'. Pt states he does not want to be in the home if his ex-fiance' is not there.   Pt has been assessed by TTS multiple times c/o similar concerns. Pt was evaluated on 07/08/17, 07/14/17 and 07/22/17 due to alcohol abuse and SI. Pt was given OPT resources to [facility] during his previous ED visit and he states he did not follow up with [facility] because he has to detox from alcohol. Pt soes not have a current provider and states he was supposed to follow up with [facility] in March but he never did.   TTS consulted with Donell Sievert, PA who recommends inpt treatment. EDP Leaphart, Lynann Beaver, PA-C has been advised. AC reviewing pt for possible admission to [facility]. TTS to monitor and seek inpt placement if [facility] does not have an appropriate bed.   Diagnosis: MDD, recurrent, severe, w/o psychosis; Alcohol use disorder, severe   Past Medical History:  Past Medical History:  Diagnosis Date  . Alcohol abuse   . Hypertension     Past Surgical History:  Procedure Laterality Date  . COLONOSCOPY    . MOUTH SURGERY    . NO PAST SURGERIES      Family History:  Family History  Problem Relation Age of Onset   . Heart attack Paternal Grandfather   . Stroke Mother   . Heart failure Mother   . Heart failure Father     Social History:  reports that he has been smoking cigarettes.  He has been smoking about 2.00 packs per day. He has never used smokeless tobacco. He reports that he drinks alcohol. He reports that he has current or past drug history. Drug: Marijuana.  Additional Social History:  Alcohol / Drug Use Pain Medications: See MAR Prescriptions: See MAR Over the Counter: See MAR History of alcohol / drug use?: Yes Longest period of sobriety (when/how long): none reported Negative Consequences of Use: Financial, Personal relationships, Work / School Withdrawal Symptoms: Sweats, Diarrhea, Tremors, Nausea / Vomiting, Fever / Chills Substance #1 Name of Substance 1: Alcohol 1 - Age of First Use: 11 1 - Amount (size/oz): binge drinking up to 2 fifths of vodka  1 - Frequency: daily 1 - Duration: ongoing 1 - Last Use / Amount: 07/23/17 Substance #2 Name of Substance 2: Cannabis 2 - Age of First Use: teens 2 - Amount (size/oz): varies 2 - Frequency: occasional 2 - Duration: ongoing 2 - Last Use / Amount: unknown  CIWA: CIWA-Ar BP: 92/69 Pulse Rate: 67 COWS:    Allergies:  Allergies  Allergen Reactions  . Penicillins  Other (See Comments)    Childhood allergy Reaction:  Unknown  Has patient had a PCN reaction causing immediate rash, facial/tongue/throat swelling, SOB or lightheadedness with hypotension: Unsure Has patient had a PCN reaction causing severe rash involving mucus membranes or skin necrosis: Unsure Has patient had a PCN reaction that required hospitalization Unsure Has patient had a PCN reaction occurring within the last 10 years: No If all of the above answers are "NO", then may proceed with Cephalosporin use.  . Statins Palpitations    Home Medications:  (Not in a hospital admission)  OB/GYN Status:  No LMP for male patient.  General Assessment  Data Location of Assessment: [facility] TTS Assessment: In system Is this a Tele or Face-to-Face Assessment?: Face-to-Face Is this an Initial Assessment or a Re-assessment for this encounter?: Initial Assessment Marital status: Single Is patient pregnant?: No Pregnancy Status: No Living Arrangements: Alone Can pt return to current living arrangement?: Yes Admission Status: Voluntary Is patient capable of signing voluntary admission?: Yes Referral Source: Self/Family/Friend Insurance type: medicaid potential      Crisis Care Plan Living Arrangements: Alone Name of Psychiatrist: none currently Name of Therapist: none currently  Education Status Is patient currently in school?: No Is the patient employed, unemployed or receiving disability?: Unemployed  Risk to self with the past 6 months Suicidal Ideation: Yes-Currently Present Has patient been a risk to self within the past 6 months prior to admission? : Yes Suicidal Intent: Yes-Currently Present Has patient had any suicidal intent within the past 6 months prior to admission? : Yes Is patient at risk for suicide?: Yes Suicidal Plan?: Yes-Currently Present Has patient had any suicidal plan within the past 6 months prior to admission? : Yes Specify Current Suicidal Plan: pt intentionally ingested an unknown amount of Rispiridone and Prozac  Access to Means: Yes Specify Access to Suicidal Means: pt has access to medication What has been your use of drugs/alcohol within the last 12 months?: heavy alcohol use, occasional marijuana use  Previous Attempts/Gestures: No Triggers for Past Attempts: None known Intentional Self Injurious Behavior: None Family Suicide History: No Recent stressful life event(s): Loss (Comment), Other (Comment)(substance abuse, ended relationship) Persecutory voices/beliefs?: No Depression: Yes Depression Symptoms: Despondent, Insomnia, Tearfulness, Isolating, Fatigue, Guilt, Feeling worthless/self pity, Loss  of interest in usual pleasures, Feeling angry/irritable Substance abuse history and/or treatment for substance abuse?: Yes Suicide prevention information given to non-admitted patients: Not applicable  Risk to Others within the past 6 months Homicidal Ideation: No Does patient have any lifetime risk of violence toward others beyond the six months prior to admission? : No Thoughts of Harm to Others: No Current Homicidal Intent: No Current Homicidal Plan: No Access to Homicidal Means: No History of harm to others?: No Assessment of Violence: None Noted Does patient have access to weapons?: Yes (Comment)(pellet guns, pocket knife ) Criminal Charges Pending?: Yes Describe Pending Criminal Charges: assault on male  Does patient have a court date: Yes Court Date: 07/31/17 Is patient on probation?: No  Psychosis Hallucinations: None noted Delusions: None noted  Mental Status Report Appearance/Hygiene: Unremarkable Eye Contact: Fair Motor Activity: Freedom of movement Speech: Logical/coherent Level of Consciousness: Alert Mood: Depressed, Worthless, low self-esteem, Helpless Affect: Depressed, Sad, Sullen Anxiety Level: None Thought Processes: Relevant, Coherent Judgement: Impaired Orientation: Person, Place, Appropriate for developmental age, Situation, Time Obsessive Compulsive Thoughts/Behaviors: None  Cognitive Functioning Concentration: Normal Memory: Remote Intact, Recent Intact Is patient IDD: No Is patient DD?: No Insight: Poor Impulse Control: Poor Appetite:  Fair Have you had any weight changes? : No Change Sleep: Decreased Total Hours of Sleep: 3 Vegetative Symptoms: None  ADLScreening [facility] Assessment Services) Patient's cognitive ability adequate to safely complete daily activities?: Yes Patient able to express need for assistance with ADLs?: Yes Independently performs ADLs?: Yes (appropriate for developmental age)  Prior Inpatient Therapy Prior Inpatient  Therapy: Yes Prior Therapy Dates: 2018, 2008 Prior Therapy Facilty/Provider(s): [facility], [facility] Reason for Treatment: ALCOHOL DEPENDENCE   Prior Outpatient Therapy Prior Outpatient Therapy: Yes Prior Therapy Dates: 2018 Prior Therapy Facilty/Provider(s): [facility] Reason for Treatment: med management Does patient have an ACCT team?: No Does patient have Intensive In-House Services?  : No Does patient have [facility] services? : No Does patient have P4CC services?: No  ADL Screening (condition at time of admission) Patient's cognitive ability adequate to safely complete daily activities?: Yes Is the patient deaf or have difficulty hearing?: No Does the patient have difficulty seeing, even when wearing glasses/contacts?: No Does the patient have difficulty concentrating, remembering, or making decisions?: No Patient able to express need for assistance with ADLs?: Yes Does the patient have difficulty dressing or bathing?: No Independently performs ADLs?: Yes (appropriate for developmental age) Does the patient have difficulty walking or climbing stairs?: No Weakness of Legs: None Weakness of Arms/Hands: None  Home Assistive Devices/Equipment Home Assistive Devices/Equipment: Brace (specify type)    Abuse/Neglect Assessment (Assessment to be complete while patient is alone) Abuse/Neglect Assessment Can Be Completed: Yes Physical Abuse: Denies Verbal Abuse: Yes, past (Comment)(childhood) Sexual Abuse: Denies Exploitation of patient/patient's resources: Denies Self-Neglect: Denies     Merchant navy officer (For Healthcare) Does Patient Have a Medical Advance Directive?: No Would patient like information on creating a medical advance directive?: No - Patient declined    Additional Information 1:1 In Past 12 Months?: No CIRT Risk: No Elopement Risk: No Does patient have medical clearance?: Yes     Disposition: TTS consulted with Donell Sievert, PA who recommends inpt treatment. EDP  Leaphart, Lynann Beaver, PA-C has been advised. AC reviewing pt for possible admission to [facility]. TTS to monitor and seek inpt placement if [facility] does not have an appropriate bed.   Disposition Initial Assessment Completed for this Encounter: Yes Disposition of Patient: Admit Type of inpatient treatment program: Adult(per Donell Sievert, PA ) Patient refused recommended treatment: No  On Site Evaluation by:   Reviewed with Physician:    Karolee Ohs 07/24/2017 1:40 AM

## 2017-07-24 NOTE — Progress Notes (Signed)
TTS consulted with Donell Sievert, PA who recommends inpt treatment. EDP Leaphart, Lynann Beaver, PA-C has been advised. AC reviewing pt for possible admission to [facility]. TTS to monitor and seek inpt placement if [facility] does not have an appropriate bed.   Princess Bruins, MSW, LCSW Therapeutic Triage Specialist  [phone]

## 2017-07-24 NOTE — ED Provider Notes (Cosign Needed Addendum)
[facility]-EMERGENCY DEPT Provider Note   CSN: [id number] Arrival date & time: 07/23/17  1813     History   Chief Complaint Chief Complaint  Patient presents with  . Drug Overdose  . Suicidal    HPI Zachary [name] is a 57 y.o. male.  HPI 57 year old Caucasian male past medical history significant for alcohol abuse, hypertension that presents to the emergency department today for overdose.  Patient reports that he called 911 threatening to kill himself.  Patient threatened to cut himself and stated he wanted to die.  Upon arrival of EMS patient states that he took a handful of Risperdal and Prozac.  Does not know them out.  Also admits to drinking EtOH today.  Patient states that his girlfriend dumped him and that this is why he wants to kill himself.  Patient states he was just discharged from [facility]. She reports chronic neck pain from her known fracture.  Not currently wearing his soft collar for comfort.  Denies any other pain at this time.  Denies any new injury.  Patient denies any other drug use.  Denies any auditory visual hallucinations.  Denies any homicidal ideations. Past Medical History:  Diagnosis Date  . Alcohol abuse   . Hypertension     Patient Active Problem List   Diagnosis Date Noted  . Alcohol abuse with alcohol-induced mood disorder (HCC) 07/22/2017  . Adjustment disorder with depressed mood 07/09/2017  . Polysubstance abuse (HCC) 07/09/2017  . Prolonged Q-T interval on ECG   . Suicidal thoughts   . Drug overdose 07/08/2017  . Acute GI bleeding 07/08/2017  . Depression 07/08/2017  . Alcohol dependence with uncomplicated withdrawal (HCC) 12/30/2016  . Major depressive disorder, recurrent severe without psychotic features (HCC) 12/30/2016  . Chest pain 10/28/2015  . Hypertension 10/28/2015  . Alcohol abuse 10/28/2015  . Hyperglycemia 10/28/2015  . Alcohol withdrawal (HCC) 10/28/2015  . Tobacco abuse 10/28/2015  . Chest pain at rest  10/28/2015  . HTN (hypertension), benign     Past Surgical History:  Procedure Laterality Date  . COLONOSCOPY    . MOUTH SURGERY    . NO PAST SURGERIES          Home Medications    Prior to Admission medications   Medication Sig Start Date End Date Taking? Authorizing Provider  FLUoxetine (PROZAC) 20 MG capsule Take 60 mg by mouth daily.  06/18/17  Yes [provider]  risperiDONE (RISPERDAL) 0.5 MG tablet Take 0.5 mg by mouth 2 (two) times daily. 06/18/17  Yes [provider]  folic acid (FOLVITE) 1 MG tablet Take 1 tablet (1 mg total) by mouth daily. Patient not taking: Reported on 07/23/2017 07/11/17   Glade Lloyd, MD  gabapentin (NEURONTIN) 300 MG capsule Take 1 capsule (300 mg total) by mouth 2 (two) times daily. For agitation Patient not taking: Reported on 07/23/2017 01/03/17   Money, Gerlene Burdock, FNP  lisinopril (PRINIVIL,ZESTRIL) 20 MG tablet Take 1 tablet (20 mg total) by mouth daily. Patient not taking: Reported on 07/23/2017 07/11/17   Glade Lloyd, MD  Multiple Vitamin (MULTIVITAMIN WITH MINERALS) TABS tablet Take 1 tablet by mouth daily. Patient not taking: Reported on 07/23/2017 07/11/17   Glade Lloyd, MD  pantoprazole (PROTONIX) 40 MG tablet Take 1 tablet (40 mg total) by mouth daily. Patient not taking: Reported on 07/23/2017 07/11/17   Glade Lloyd, MD  thiamine 100 MG tablet Take 1 tablet (100 mg total) by mouth daily. Patient not taking: Reported on  07/23/2017 07/11/17   Glade Lloyd, MD    Family History Family History  Problem Relation Age of Onset  . Heart attack Paternal Grandfather   . Stroke Mother   . Heart failure Mother   . Heart failure Father     Social History Social History   Tobacco Use  . Smoking status: Current Some Day Smoker    Packs/day: 2.00    Types: Cigarettes  . Smokeless tobacco: Never Used  Substance Use Topics  . Alcohol use: Yes    Comment: daily  . Drug use: Yes    Types: Marijuana     Allergies     Penicillins and Statins   Review of Systems Review of Systems  Constitutional: Negative for chills and fever.  HENT: Negative for congestion.   Eyes: Negative for visual disturbance.  Respiratory: Negative for shortness of breath.   Cardiovascular: Negative for chest pain.  Gastrointestinal: Negative for abdominal pain.  Musculoskeletal: Positive for neck pain (baseline).  Neurological: Negative for headaches.  Psychiatric/Behavioral: Positive for self-injury and suicidal ideas.     Physical Exam Updated Vital Signs BP 92/69   Pulse 67   Resp 16   Ht 6' (1.829 m)   Wt 86.2 kg (190 lb)   SpO2 90%   BMI 25.77 kg/m   Physical Exam  Constitutional: He is oriented to person, place, and time. He appears well-developed and well-nourished. No distress.  HENT:  Head: Normocephalic and atraumatic.  Eyes: Conjunctivae are normal. Right eye exhibits no discharge. Left eye exhibits no discharge. No scleral icterus.  Neck: Normal range of motion. Neck supple.  No c spine midline tenderness. No paraspinal tenderness. No deformities or step offs noted. Full ROM. Supple. No nuchal rigidity.    Cardiovascular: Normal rate, regular rhythm, normal heart sounds and intact distal pulses. Exam reveals no gallop and no friction rub.  No murmur heard. Pulmonary/Chest: Effort normal and breath sounds normal. No stridor. No respiratory distress. He has no wheezes. He has no rales. He exhibits no tenderness.  Musculoskeletal: Normal range of motion.  Neurological: He is alert and oriented to person, place, and time.  Patient moves all extremities any difficulties.  Patient is awake on my examination.  Follows commands appropriately.  Skin: Skin is warm and dry. Capillary refill takes less than 2 seconds. No pallor.  Psychiatric: His behavior is normal. Judgment and thought content normal.  Nursing note and vitals reviewed.    ED Treatments / Results  Labs (all labs ordered are listed, but  only abnormal results are displayed) Labs Reviewed  COMPREHENSIVE METABOLIC PANEL - Abnormal; Notable for the following components:      Result Value   Potassium 3.4 (*)    BUN <5 (*)    All other components within normal limits  ETHANOL - Abnormal; Notable for the following components:   Alcohol, Ethyl (B) 178 (*)    All other components within normal limits  CBC - Abnormal; Notable for the following components:   MCHC 37.0 (*)    All other components within normal limits  RAPID URINE DRUG SCREEN, HOSP PERFORMED - Abnormal; Notable for the following components:   Benzodiazepines POSITIVE (*)    All other components within normal limits  ACETAMINOPHEN LEVEL - Abnormal; Notable for the following components:   Acetaminophen (Tylenol), Serum <10 (*)    All other components within normal limits  SALICYLATE LEVEL  CBG MONITORING, ED    EKG EKG Interpretation  Date/Time:  Sunday Jul 23 2017 18:51:19 EDT Ventricular Rate:  68 PR Interval:    QRS Duration: 116 QT Interval:  418 QTC Calculation: 445 R Axis:   27 Text Interpretation:  Sinus rhythm Atrial premature complex Incomplete right bundle branch block When compared to prior, no significant changes seen.  NO STEMI Confirmed by Theda Belfast ([id number]) on 07/23/2017 7:14:02 PM   Radiology No results found.  Procedures Procedures (including critical care time)  Medications Ordered in ED Medications - No data to display   Initial Impression / Assessment and Plan / ED Course  I have reviewed the triage vital signs and the nursing notes.  Pertinent labs & imaging results that were available during my care of the patient were reviewed by me and considered in my medical decision making (see chart for details).     Patient presents to the emergency department for evaluation of overdose.  Poison control was notified.  They recommend 6-hour obvious with QT monitoring including acetaminophen level and salicylate level.  Monitor for  excessive sleepiness or tachycardia.  Patient vital signs remained reassuring.  He is not tachycardic.  Blood pressures are soft however patient is sleeping on his side.  Patient is afebrile.  Oxygen levels are slightly low however patient is sleeping.  Initial lab work is reassuring.  No significant electrolyte derangement.  Normal acetaminophen and salicylate level.  Ethanol is 178.  UDS is positive for benzos.  Initial EKG appears similar to prior tracing without any prolonged QT.  6-hour observation has been reached.  Patient is arousable to verbal stimuli.  Elect lites remain reassuring.  Awaiting repeat EKG, Tylenol and salicylate level.  If these are normal patient can be medically cleared and evaluated by TTS.  EKG was normal.  Awaiting Tylenol and salicylate levels.  Patient to be signed out to oncoming PA.  She will clear patient if indicated.    Final Clinical Impressions(s) / ED Diagnoses   Final diagnoses:  Suicidal ideation  Intentional drug overdose, initial encounter [facility])    ED Discharge Orders    None       Rise Mu, PA-C 07/24/17 0038    Rise Mu, PA-C 07/24/17 [phone]

## 2017-07-24 NOTE — ED Provider Notes (Signed)
Care assumed at shift change from [place], PA-C. See his note for full HPI and workup. Pt presenting w suicide attempt w overdose on Rispiridone and prozac with EtOH. Labs reassuring. Pt monitored 6hours. Repeat EKG normal. TTS recommending inpatient. Plan follow up repeat acetaminophen and salicylate. If normal, pt can be medically cleared for psych admission. Physical Exam  BP 92/69   Pulse 67   Resp 16   Ht 6' (1.829 m)   Wt 86.2 kg (190 lb)   SpO2 90%   BMI 25.77 kg/m   Physical Exam  Constitutional: He appears well-developed and well-nourished.  HENT:  Head: Normocephalic and atraumatic.  Eyes: Conjunctivae are normal.  Cardiovascular: Normal rate.  Pulmonary/Chest: Effort normal.  Psychiatric: He has a normal mood and affect. His behavior is normal.  Nursing note and vitals reviewed.   ED Course/Procedures   Clinical Course as of Jul 25 318  Mon Jul 24, 2017  0121 TTS recommending inpatient psych once cleared.   [JR]    Clinical Course User Index [JR] Johnnisha Forton, [place] N, PA-C    Procedures  MDM  Acetaminophen and salicylate level normal.  Patient is medically cleared for psychiatric admission.       Dalan Cowger, [place] N, PA-C 07/24/17 1610    Tegeler, Canary Brim, MD 07/24/17 1120

## 2017-07-24 NOTE — BH Assessment (Signed)
[facility] Assessment Progress Note  Per Juanetta Beets, DO, this pt does not require psychiatric hospitalization at this time.  Pt is to be discharged from [facility] with recommendation to follow through with plan to go to [facility] on [address].  This has been included in pt's discharge instructions.  Pt's nurse has been notified.  Doylene Canning, MA Triage Specialist [phone]

## 2017-09-17 ENCOUNTER — Other Ambulatory Visit: Payer: Self-pay

## 2017-09-17 ENCOUNTER — Emergency Department (HOSPITAL_COMMUNITY)
Admission: EM | Admit: 2017-09-17 | Discharge: 2017-09-17 | Disposition: A | Discharge status: Home / Self Care | Payer: Self-pay | Attending: Emergency Medicine | Admitting: Emergency Medicine

## 2017-09-17 ENCOUNTER — Encounter (HOSPITAL_COMMUNITY): Payer: Self-pay

## 2017-09-17 ENCOUNTER — Emergency Department (HOSPITAL_COMMUNITY): Payer: Self-pay

## 2017-09-17 DIAGNOSIS — I10 Essential (primary) hypertension: Secondary | ICD-10-CM | POA: Insufficient documentation

## 2017-09-17 DIAGNOSIS — F10930 Alcohol use, unspecified with withdrawal, uncomplicated: Secondary | ICD-10-CM

## 2017-09-17 DIAGNOSIS — F17210 Nicotine dependence, cigarettes, uncomplicated: Secondary | ICD-10-CM | POA: Insufficient documentation

## 2017-09-17 DIAGNOSIS — F10230 Alcohol dependence with withdrawal, uncomplicated: Secondary | ICD-10-CM | POA: Insufficient documentation

## 2017-09-17 LAB — CBC WITH DIFFERENTIAL/PLATELET
BASOS PCT: 1 %
Basophils Absolute: 0.1 10*3/uL (ref 0.0–0.1)
Eosinophils Absolute: 0.1 10*3/uL (ref 0.0–0.7)
Eosinophils Relative: 1 %
HEMATOCRIT: 47.4 % (ref 39.0–52.0)
HEMOGLOBIN: 17.4 g/dL — AB (ref 13.0–17.0)
LYMPHS ABS: 1.5 10*3/uL (ref 0.7–4.0)
Lymphocytes Relative: 22 %
MCH: 33.9 pg (ref 26.0–34.0)
MCHC: 36.7 g/dL — AB (ref 30.0–36.0)
MCV: 92.4 fL (ref 78.0–100.0)
MONO ABS: 1.2 10*3/uL — AB (ref 0.1–1.0)
MONOS PCT: 18 %
NEUTROS ABS: 3.9 10*3/uL (ref 1.7–7.7)
NEUTROS PCT: 58 %
Platelets: 211 10*3/uL (ref 150–400)
RBC: 5.13 MIL/uL (ref 4.22–5.81)
RDW: 12.8 % (ref 11.5–15.5)
WBC: 6.7 10*3/uL (ref 4.0–10.5)

## 2017-09-17 LAB — COMPREHENSIVE METABOLIC PANEL
ALBUMIN: 4.1 g/dL (ref 3.5–5.0)
ALK PHOS: 108 U/L (ref 38–126)
ALT: 77 U/L — ABNORMAL HIGH (ref 0–44)
ANION GAP: 11 (ref 5–15)
AST: 54 U/L — AB (ref 15–41)
BILIRUBIN TOTAL: 0.9 mg/dL (ref 0.3–1.2)
BUN: 9 mg/dL (ref 6–20)
CALCIUM: 8.9 mg/dL (ref 8.9–10.3)
CO2: 25 mmol/L (ref 22–32)
Chloride: 103 mmol/L (ref 98–111)
Creatinine, Ser: 0.68 mg/dL (ref 0.61–1.24)
GFR calc Af Amer: >60 mL/min (ref >60)
GFR calc non Af Amer: >60 mL/min (ref >60)
GLUCOSE: 131 mg/dL — AB (ref 70–99)
POTASSIUM: 4.1 mmol/L (ref 3.5–5.1)
SODIUM: 139 mmol/L (ref 135–145)
TOTAL PROTEIN: 7 g/dL (ref 6.5–8.1)

## 2017-09-17 LAB — MAGNESIUM: MAGNESIUM: 2.2 mg/dL (ref 1.7–2.4)

## 2017-09-17 LAB — ETHANOL: Alcohol, Ethyl (B): 130 mg/dL — ABNORMAL HIGH (ref <10)

## 2017-09-17 MED ORDER — LORAZEPAM 2 MG/ML IJ SOLN: 0.0000 mg | Freq: Two times a day (BID) | INTRAMUSCULAR | Status: DC

## 2017-09-17 MED ORDER — SODIUM CHLORIDE 0.9 % IV BOLUS
1000.0000 mL | Freq: Once | INTRAVENOUS | Status: AC
  Administered 2017-09-17: 1000 mL via INTRAVENOUS

## 2017-09-17 MED ORDER — LORAZEPAM 1 MG PO TABS: 0.0000 mg | ORAL_TABLET | Freq: Two times a day (BID) | ORAL | Status: DC

## 2017-09-17 MED ORDER — LORAZEPAM 2 MG/ML IJ SOLN
0.0000 mg | Freq: Four times a day (QID) | INTRAMUSCULAR | Status: DC
  Administered 2017-09-17: 2 mg via INTRAVENOUS
  Filled 2017-09-17: qty 1

## 2017-09-17 MED ORDER — VITAMIN B-1 100 MG PO TABS
100.0000 mg | ORAL_TABLET | Freq: Every day | ORAL | Status: DC
  Administered 2017-09-17: 100 mg via ORAL
  Filled 2017-09-17: qty 1

## 2017-09-17 MED ORDER — PROMETHAZINE HCL 25 MG PO TABS: 25.0000 mg | ORAL_TABLET | Freq: Four times a day (QID) | ORAL | 0 refills | Status: DC | PRN

## 2017-09-17 MED ORDER — CHLORDIAZEPOXIDE HCL 25 MG PO CAPS: ORAL_CAPSULE | ORAL | 0 refills | Status: DC

## 2017-09-17 MED ORDER — IPRATROPIUM-ALBUTEROL 0.5-2.5 (3) MG/3ML IN SOLN
3.0000 mL | Freq: Once | RESPIRATORY_TRACT | Status: DC
  Filled 2017-09-17: qty 3

## 2017-09-17 MED ORDER — THIAMINE HCL 100 MG/ML IJ SOLN: 100.0000 mg | Freq: Every day | INTRAMUSCULAR | Status: DC

## 2017-09-17 MED ORDER — LORAZEPAM 1 MG PO TABS
0.0000 mg | ORAL_TABLET | Freq: Four times a day (QID) | ORAL | Status: DC
  Administered 2017-09-17: 2 mg via ORAL
  Filled 2017-09-17: qty 2

## 2017-09-17 NOTE — ED Triage Notes (Signed)
Pt presents to ED via EMS for ETOH withdrawal. Pt reports that he drinks "a lot" of alcohol, and ran out yesterday. Symptoms include tremors, N/V, and headache that started early this morning.

## 2017-09-17 NOTE — ED Notes (Signed)
Pt given urinal and made aware of need for urine specimen 

## 2017-09-17 NOTE — ED Notes (Signed)
Pt ambulated to restroom but was unable to provide enough urine for specimen

## 2017-09-17 NOTE — ED Notes (Signed)
Pt reports drinking around 24 beers/day for the past week.

## 2017-09-17 NOTE — ED Provider Notes (Addendum)
[facility]-EMERGENCY DEPT Provider Note   CSN: [id number] Arrival date & time: 09/17/17  8295     History   Chief Complaint Chief Complaint  Patient presents with  . Withdrawal    HPI Zachary [name] is a 57 y.o. male.  Pt presents to the ED today with alcohol withdrawal sx.  The pt has a hx of alcohol abuse and was admitted to [facility] from 5/23-5/28 for voluntary detox.  The pt did well through detox, but started drinking shortly after release.  He wants to quit and tried to taper off alcohol on his own, but is unable to do so.  He drank last night around 2000 and around 0200.  The pt has had n/v and the zofran given by EMS has helped that.  He feels very shaky.  No seizures with w/dr in the past.     Past Medical History:  Diagnosis Date  . Alcohol abuse   . Hypertension     Patient Active Problem List   Diagnosis Date Noted  . Alcohol abuse with alcohol-induced mood disorder (HCC) 07/22/2017  . Adjustment disorder with depressed mood 07/09/2017  . Polysubstance abuse (HCC) 07/09/2017  . Prolonged Q-T interval on ECG   . Suicidal thoughts   . Drug overdose 07/08/2017  . Acute GI bleeding 07/08/2017  . Depression 07/08/2017  . Alcohol dependence with uncomplicated withdrawal (HCC) 12/30/2016  . Major depressive disorder, recurrent severe without psychotic features (HCC) 12/30/2016  . Chest pain 10/28/2015  . Hypertension 10/28/2015  . Alcohol abuse 10/28/2015  . Hyperglycemia 10/28/2015  . Alcohol withdrawal (HCC) 10/28/2015  . Tobacco abuse 10/28/2015  . Chest pain at rest 10/28/2015  . HTN (hypertension), benign     Past Surgical History:  Procedure Laterality Date  . COLONOSCOPY    . MOUTH SURGERY    . NO PAST SURGERIES          Home Medications    Prior to Admission medications   Medication Sig Start Date End Date Taking? Authorizing Provider  chlordiazePOXIDE (LIBRIUM) 25 MG capsule 50mg  PO TID x 1D, then 25-50mg  PO BID X 1D,  then 25-50mg  PO QD X 1D 09/17/17   Jacalyn Lefevre, MD  folic acid (FOLVITE) 1 MG tablet Take 1 tablet (1 mg total) by mouth daily. Patient not taking: Reported on 07/23/2017 07/11/17   Glade Lloyd, MD  gabapentin (NEURONTIN) 300 MG capsule Take 1 capsule (300 mg total) by mouth 2 (two) times daily. For agitation Patient not taking: Reported on 07/23/2017 01/03/17   Money, Gerlene Burdock, FNP  lisinopril (PRINIVIL,ZESTRIL) 20 MG tablet Take 1 tablet (20 mg total) by mouth daily. Patient not taking: Reported on 07/23/2017 07/11/17   Glade Lloyd, MD  Multiple Vitamin (MULTIVITAMIN WITH MINERALS) TABS tablet Take 1 tablet by mouth daily. Patient not taking: Reported on 07/23/2017 07/11/17   Glade Lloyd, MD  pantoprazole (PROTONIX) 40 MG tablet Take 1 tablet (40 mg total) by mouth daily. Patient not taking: Reported on 07/23/2017 07/11/17   Glade Lloyd, MD  promethazine (PHENERGAN) 25 MG tablet Take 1 tablet (25 mg total) by mouth every 6 (six) hours as needed for nausea or vomiting. 09/17/17   Jacalyn Lefevre, MD  thiamine 100 MG tablet Take 1 tablet (100 mg total) by mouth daily. Patient not taking: Reported on 07/23/2017 07/11/17   Glade Lloyd, MD    Family History Family History  Problem Relation Age of Onset  . Heart attack Paternal Grandfather   . Stroke  Mother   . Heart failure Mother   . Heart failure Father     Social History Social History   Tobacco Use  . Smoking status: Current Some Day Smoker    Packs/day: 2.00    Types: Cigarettes  . Smokeless tobacco: Never Used  Substance Use Topics  . Alcohol use: Yes    Comment: daily  . Drug use: Yes    Types: Marijuana     Allergies   Penicillins and Statins   Review of Systems Review of Systems  Gastrointestinal: Positive for nausea and vomiting.  Neurological: Positive for tremors.  All other systems reviewed and are negative.    Physical Exam Updated Vital Signs BP (!) 131/92 (BP Location: Right Arm)   Pulse 94    Temp 98.1 F (36.7 C) (Oral)   Resp 18   Ht 5\' 11"  (1.803 m)   Wt 86.2 kg (190 lb)   SpO2 95%   BMI 26.50 kg/m   Physical Exam  Constitutional: He is oriented to person, place, and time. He appears well-developed and well-nourished.  HENT:  Head: Normocephalic and atraumatic.  Right Ear: External ear normal.  Left Ear: External ear normal.  Nose: Nose normal.  Mouth/Throat: Mucous membranes are dry.  Eyes: Pupils are equal, round, and reactive to light. Conjunctivae and EOM are normal.  Neck: Normal range of motion. Neck supple.  Cardiovascular: Normal rate, regular rhythm, normal heart sounds and intact distal pulses.  Pulmonary/Chest: Effort normal and breath sounds normal.  Abdominal: Soft. Bowel sounds are normal.  Musculoskeletal: Normal range of motion.  Neurological: He is alert and oriented to person, place, and time. He displays tremor.  Skin: Skin is warm. Capillary refill takes less than 2 seconds.  Psychiatric: He has a normal mood and affect. His behavior is normal. Judgment and thought content normal.  Nursing note and vitals reviewed.    ED Treatments / Results  Labs (all labs ordered are listed, but only abnormal results are displayed) Labs Reviewed  COMPREHENSIVE METABOLIC PANEL - Abnormal; Notable for the following components:      Result Value   Glucose, Bld 131 (*)    AST 54 (*)    ALT 77 (*)    All other components within normal limits  ETHANOL - Abnormal; Notable for the following components:   Alcohol, Ethyl (B) 130 (*)    All other components within normal limits  CBC WITH DIFFERENTIAL/PLATELET - Abnormal; Notable for the following components:   Hemoglobin 17.4 (*)    MCHC 36.7 (*)    Monocytes Absolute 1.2 (*)    All other components within normal limits  MAGNESIUM  RAPID URINE DRUG SCREEN, HOSP PERFORMED  URINALYSIS, ROUTINE W REFLEX MICROSCOPIC    EKG EKG Interpretation  Date/Time:  Sunday September 17 2017 06:35:33 EDT Ventricular Rate:    83 PR Interval:    QRS Duration: 113 QT Interval:  375 QTC Calculation: 441 R Axis:   -46 Text Interpretation:  Sinus rhythm Incomplete RBBB and LAFB No significant change since last tracing Confirmed by Jacalyn LefevreHaviland, Riverlyn Kizziah [phone]([id number]) on 09/17/2017 8:47:35 AM Also confirmed by Jacalyn LefevreHaviland, Kenniel Bergsma [phone]), editor Josephine IgoBelcher, Jessica [phone])  on 09/17/2017 11:11:28 AM   Radiology Dg Chest 2 View  Result Date: 09/17/2017 CLINICAL DATA:  Alcohol withdrawal symptoms.  Tobacco use. EXAM: CHEST - 2 VIEW COMPARISON:  Jul 14, 2017 FINDINGS: Lungs are hyperexpanded. There is no edema or consolidation. Heart size and pulmonary vascularity are within normal limits. No adenopathy. No evident  bone lesions. IMPRESSION: Lungs hyperexpanded without edema or consolidation. Stable cardiac silhouette. Electronically Signed   By: Bretta Bang III M.D.   On: 09/17/2017 08:33    Procedures Procedures (including critical care time)  Medications Ordered in ED Medications  LORazepam (ATIVAN) injection 0-4 mg ( Intravenous See Alternative 09/17/17 0801)    Or  LORazepam (ATIVAN) tablet 0-4 mg (2 mg Oral Given 09/17/17 0801)  LORazepam (ATIVAN) injection 0-4 mg (has no administration in time range)    Or  LORazepam (ATIVAN) tablet 0-4 mg (has no administration in time range)  thiamine (VITAMIN B-1) tablet 100 mg (100 mg Oral Given 09/17/17 0801)    Or  thiamine (B-1) injection 100 mg ( Intravenous See Alternative 09/17/17 0801)  ipratropium-albuterol (DUONEB) 0.5-2.5 (3) MG/3ML nebulizer solution 3 mL (3 mLs Nebulization Refused 09/17/17 0801)  sodium chloride 0.9 % bolus 1,000 mL (1,000 mLs Intravenous New Bag/Given 09/17/17 1048)     Initial Impression / Assessment and Plan / ED Course  I have reviewed the triage vital signs and the nursing notes.  Pertinent labs & imaging results that were available during my care of the patient were reviewed by me and considered in my medical decision making (see chart for details).    Pt  looks much better.  He wants inpatient admission, but we don't do that here.  He is given a Facilities manager for outpatient options.  Return if worse.  Final Clinical Impressions(s) / ED Diagnoses   Final diagnoses:  Alcohol withdrawal syndrome without complication [facility])    ED Discharge Orders        Ordered    chlordiazePOXIDE (LIBRIUM) 25 MG capsule     09/17/17 1203    promethazine (PHENERGAN) 25 MG tablet  Every 6 hours PRN     09/17/17 1203       Jacalyn Lefevre, MD 09/17/17 1210    Jacalyn Lefevre, MD 09/26/17 1932

## 2017-09-17 NOTE — ED Notes (Signed)
Pt reminded of need for urine 

## 2017-09-17 NOTE — ED Notes (Signed)
Bed: WA20 Expected date:  Expected time:  Means of arrival:  Comments: 57 m n/v

## 2017-12-29 ENCOUNTER — Encounter (HOSPITAL_COMMUNITY): Payer: Self-pay | Admitting: Internal Medicine

## 2017-12-29 ENCOUNTER — Emergency Department (HOSPITAL_COMMUNITY)
Admission: EM | Admit: 2017-12-29 | Discharge: 2017-12-29 | Disposition: A | Discharge status: Home / Self Care | Payer: Self-pay | Attending: Emergency Medicine | Admitting: Emergency Medicine

## 2017-12-29 ENCOUNTER — Emergency Department (HOSPITAL_COMMUNITY): Payer: Self-pay

## 2017-12-29 DIAGNOSIS — F17210 Nicotine dependence, cigarettes, uncomplicated: Secondary | ICD-10-CM | POA: Insufficient documentation

## 2017-12-29 DIAGNOSIS — I10 Essential (primary) hypertension: Secondary | ICD-10-CM | POA: Insufficient documentation

## 2017-12-29 DIAGNOSIS — F1010 Alcohol abuse, uncomplicated: Secondary | ICD-10-CM | POA: Insufficient documentation

## 2017-12-29 DIAGNOSIS — R059 Cough, unspecified: Secondary | ICD-10-CM

## 2017-12-29 DIAGNOSIS — R05 Cough: Secondary | ICD-10-CM | POA: Insufficient documentation

## 2017-12-29 DIAGNOSIS — Z79899 Other long term (current) drug therapy: Secondary | ICD-10-CM | POA: Insufficient documentation

## 2017-12-29 LAB — I-STAT TROPONIN, ED: Troponin i, poc: 0.03 ng/mL (ref 0.00–0.08)

## 2017-12-29 LAB — CBC WITH DIFFERENTIAL/PLATELET
ABS IMMATURE GRANULOCYTES: 0.18 10*3/uL — AB (ref 0.00–0.07)
BASOS ABS: 0.1 10*3/uL (ref 0.0–0.1)
BASOS PCT: 1 %
Eosinophils Absolute: 0.1 10*3/uL (ref 0.0–0.5)
Eosinophils Relative: 1 %
HCT: 49.3 % (ref 39.0–52.0)
Hemoglobin: 17.8 g/dL — ABNORMAL HIGH (ref 13.0–17.0)
IMMATURE GRANULOCYTES: 2 %
LYMPHS ABS: 4 10*3/uL (ref 0.7–4.0)
Lymphocytes Relative: 36 %
MCH: 33.4 pg (ref 26.0–34.0)
MCHC: 36.1 g/dL — ABNORMAL HIGH (ref 30.0–36.0)
MCV: 92.5 fL (ref 80.0–100.0)
MONOS PCT: 15 %
Monocytes Absolute: 1.7 10*3/uL — ABNORMAL HIGH (ref 0.1–1.0)
NEUTROS ABS: 5.3 10*3/uL (ref 1.7–7.7)
NEUTROS PCT: 45 %
NRBC: 0 % (ref 0.0–0.2)
PLATELETS: 259 10*3/uL (ref 150–400)
RBC: 5.33 MIL/uL (ref 4.22–5.81)
RDW: 12 % (ref 11.5–15.5)
WBC: 11.3 10*3/uL — ABNORMAL HIGH (ref 4.0–10.5)

## 2017-12-29 LAB — I-STAT CHEM 8, ED
BUN: 5 mg/dL — ABNORMAL LOW (ref 6–20)
CALCIUM ION: 0.99 mmol/L — AB (ref 1.15–1.40)
Chloride: 103 mmol/L (ref 98–111)
Creatinine, Ser: 1.1 mg/dL (ref 0.61–1.24)
GLUCOSE: 157 mg/dL — AB (ref 70–99)
HCT: 48 % (ref 39.0–52.0)
HEMOGLOBIN: 16.3 g/dL (ref 13.0–17.0)
Potassium: 3.4 mmol/L — ABNORMAL LOW (ref 3.5–5.1)
SODIUM: 140 mmol/L (ref 135–145)
TCO2: 27 mmol/L (ref 22–32)

## 2017-12-29 LAB — COMPREHENSIVE METABOLIC PANEL
ALBUMIN: 3.9 g/dL (ref 3.5–5.0)
ALT: 51 U/L — ABNORMAL HIGH (ref 0–44)
AST: 46 U/L — ABNORMAL HIGH (ref 15–41)
Alkaline Phosphatase: 103 U/L (ref 38–126)
Anion gap: 10 (ref 5–15)
BUN: 7 mg/dL (ref 6–20)
CHLORIDE: 104 mmol/L (ref 98–111)
CO2: 24 mmol/L (ref 22–32)
Calcium: 8.9 mg/dL (ref 8.9–10.3)
Creatinine, Ser: 0.84 mg/dL (ref 0.61–1.24)
Glucose, Bld: 154 mg/dL — ABNORMAL HIGH (ref 70–99)
Potassium: 3.4 mmol/L — ABNORMAL LOW (ref 3.5–5.1)
SODIUM: 138 mmol/L (ref 135–145)
TOTAL PROTEIN: 6.5 g/dL (ref 6.5–8.1)
Total Bilirubin: 0.9 mg/dL (ref 0.3–1.2)

## 2017-12-29 LAB — TROPONIN I: Troponin I: <0.03 ng/mL (ref <0.03)

## 2017-12-29 LAB — BRAIN NATRIURETIC PEPTIDE: B NATRIURETIC PEPTIDE 5: 6.4 pg/mL (ref 0.0–100.0)

## 2017-12-29 LAB — ETHANOL: Alcohol, Ethyl (B): 224 mg/dL — ABNORMAL HIGH (ref <10)

## 2017-12-29 LAB — POC OCCULT BLOOD, ED: FECAL OCCULT BLD: NEGATIVE

## 2017-12-29 LAB — LIPASE, BLOOD: LIPASE: 31 U/L (ref 11–51)

## 2017-12-29 MED ORDER — VITAMIN B-1 100 MG PO TABS: 100.0000 mg | ORAL_TABLET | Freq: Every day | ORAL | Status: DC

## 2017-12-29 MED ORDER — LACTATED RINGERS IV BOLUS
1000.0000 mL | Freq: Once | INTRAVENOUS | Status: AC
  Administered 2017-12-29: 1000 mL via INTRAVENOUS

## 2017-12-29 MED ORDER — LORAZEPAM 1 MG PO TABS: 0.0000 mg | ORAL_TABLET | Freq: Two times a day (BID) | ORAL | Status: DC

## 2017-12-29 MED ORDER — THIAMINE HCL 100 MG/ML IJ SOLN
100.0000 mg | Freq: Every day | INTRAMUSCULAR | Status: DC
  Administered 2017-12-29: 100 mg via INTRAVENOUS
  Filled 2017-12-29: qty 2

## 2017-12-29 MED ORDER — LORAZEPAM 1 MG PO TABS: 0.0000 mg | ORAL_TABLET | Freq: Four times a day (QID) | ORAL | Status: DC

## 2017-12-29 MED ORDER — LORAZEPAM 2 MG/ML IJ SOLN: 0.0000 mg | Freq: Two times a day (BID) | INTRAMUSCULAR | Status: DC

## 2017-12-29 MED ORDER — LORAZEPAM 2 MG/ML IJ SOLN
0.0000 mg | Freq: Four times a day (QID) | INTRAMUSCULAR | Status: DC
  Administered 2017-12-29: 2 mg via INTRAVENOUS
  Filled 2017-12-29: qty 1

## 2017-12-29 NOTE — ED Notes (Addendum)
Pt given urinal and instructed to use it for the urine sample.

## 2017-12-29 NOTE — ED Notes (Signed)
Patient transported to X-ray 

## 2017-12-29 NOTE — ED Provider Notes (Signed)
[facility] EMERGENCY DEPARTMENT Provider Note   CSN: [id number] Arrival date & time: 12/29/17  1701     History   Chief Complaint Chief Complaint  Patient presents with  . Shortness of Breath    HPI Zachary [name] is a 57 y.o. male.  HPI   Patient is a 57 year old male with PMHx of HTN, EtOH abuse, and depression who presents with worsening SOB and productive cough x 4 days.  Patient states "he feels like he's been battling this since August and is concerned he has pneumonia."  Patient c/o associated chills, nausea, NBNB emesis, and decreased PO intake. He states he hasn't eaten anything in 4 days.  He also reports diffuse abdominal pain and notes dark black stools.  No hx of varices or GI bleeds.  He also has been noncompliant with his medications for several months as "lisinopril isn't doing anything."  He has been trying to cut back on EtOH intake as he typically consumes 12 beers plus a day.  Last drink at 1400 today.  No prior hx of DTs or seizures.  Per chart review patient with multiple presentations for EtOH withdrawal and intoxication most recently in July.  EMS gave patient full dose ASA PTA along with albuterol 10mg  and solumedrol for asthma exacerbation concerns as he was wheezing.  Past Medical History:  Diagnosis Date  . Alcohol abuse   . Hypertension     Patient Active Problem List   Diagnosis Date Noted  . Alcohol abuse with alcohol-induced mood disorder (HCC) 07/22/2017  . Adjustment disorder with depressed mood 07/09/2017  . Polysubstance abuse (HCC) 07/09/2017  . Prolonged Q-T interval on ECG   . Suicidal thoughts   . Drug overdose 07/08/2017  . Acute GI bleeding 07/08/2017  . Depression 07/08/2017  . Alcohol dependence with uncomplicated withdrawal (HCC) 12/30/2016  . Major depressive disorder, recurrent severe without psychotic features (HCC) 12/30/2016  . Chest pain 10/28/2015  . Hypertension 10/28/2015  . Alcohol abuse  10/28/2015  . Hyperglycemia 10/28/2015  . Alcohol withdrawal (HCC) 10/28/2015  . Tobacco abuse 10/28/2015  . Chest pain at rest 10/28/2015  . HTN (hypertension), benign     Past Surgical History:  Procedure Laterality Date  . COLONOSCOPY    . MOUTH SURGERY    . NO PAST SURGERIES          Home Medications    Prior to Admission medications   Medication Sig Start Date End Date Taking? Authorizing Provider  Aspirin Effervescent (ALKA-SELTZER ORIGINAL PO) Take 4 tablets by mouth daily. For headache and heartburn   Yes [provider]  chlordiazePOXIDE (LIBRIUM) 25 MG capsule 50mg  PO TID x 1D, then 25-50mg  PO BID X 1D, then 25-50mg  PO QD X 1D Patient not taking: Reported on 12/29/2017 09/17/17   Jacalyn Lefevre, MD  folic acid (FOLVITE) 1 MG tablet Take 1 tablet (1 mg total) by mouth daily. Patient not taking: Reported on 07/23/2017 07/11/17   Glade Lloyd, MD  gabapentin (NEURONTIN) 300 MG capsule Take 1 capsule (300 mg total) by mouth 2 (two) times daily. For agitation Patient not taking: Reported on 07/23/2017 01/03/17   Money, Gerlene Burdock, FNP  lisinopril (PRINIVIL,ZESTRIL) 20 MG tablet Take 1 tablet (20 mg total) by mouth daily. Patient not taking: Reported on 07/23/2017 07/11/17   Glade Lloyd, MD  Multiple Vitamin (MULTIVITAMIN WITH MINERALS) TABS tablet Take 1 tablet by mouth daily. Patient not taking: Reported on 07/23/2017 07/11/17   Glade Lloyd, MD  pantoprazole (  PROTONIX) 40 MG tablet Take 1 tablet (40 mg total) by mouth daily. Patient not taking: Reported on 07/23/2017 07/11/17   Glade Lloyd, MD  promethazine (PHENERGAN) 25 MG tablet Take 1 tablet (25 mg total) by mouth every 6 (six) hours as needed for nausea or vomiting. Patient not taking: Reported on 12/29/2017 09/17/17   Jacalyn Lefevre, MD  thiamine 100 MG tablet Take 1 tablet (100 mg total) by mouth daily. Patient not taking: Reported on 07/23/2017 07/11/17   Glade Lloyd, MD    Family History Family History   Problem Relation Age of Onset  . Heart attack Paternal Grandfather   . Stroke Mother   . Heart failure Mother   . Heart failure Father     Social History Social History   Tobacco Use  . Smoking status: Current Some Day Smoker    Packs/day: 2.00    Types: Cigarettes  . Smokeless tobacco: Never Used  Substance Use Topics  . Alcohol use: Yes    Comment: daily  . Drug use: Yes    Types: Marijuana     Allergies   Penicillins and Statins   Review of Systems Review of Systems  Constitutional: Positive for activity change, appetite change, chills and fatigue. Negative for fever.  HENT: Positive for congestion and rhinorrhea.   Eyes: Negative for pain and visual disturbance.  Respiratory: Positive for cough and shortness of breath.   Cardiovascular: Positive for chest pain (lower left). Negative for palpitations.  Gastrointestinal: Positive for nausea and vomiting. Negative for abdominal pain, constipation and diarrhea.       +Dark stools  Genitourinary: Negative for dysuria and hematuria.  Musculoskeletal: Negative for arthralgias and back pain.  Skin: Negative for color change and rash.  Neurological: Negative for seizures and syncope.  All other systems reviewed and are negative.    Physical Exam Updated Vital Signs BP (!) 160/83   Pulse 93   Temp 98.2 F (36.8 C) (Oral)   Resp 17   SpO2 92%   Physical Exam  Constitutional: He is oriented to person, place, and time. He appears well-developed and well-nourished.  Dishelved appearing male.  HENT:  Head: Normocephalic and atraumatic.  Dry mucous membranes. Oropharynx clear.  Eyes: Pupils are equal, round, and reactive to light. Conjunctivae and EOM are normal.  Neck: Normal range of motion. Neck supple.  Cardiovascular: Normal rate and regular rhythm.  No murmur heard. Pulmonary/Chest: Effort normal. Tachypnea (mild) noted. No respiratory distress. He has wheezes (faint left lower). He has no rhonchi. He has no  rales.  Abdominal: Soft. He exhibits no distension. There is tenderness (epigastric). There is no guarding.  No peritoneal signs.  Genitourinary: Rectal exam shows guaiac negative stool.  Genitourinary Comments: No melanotic stools or gross blood on rectal exam.  Musculoskeletal: Normal range of motion. He exhibits no edema.       Right lower leg: He exhibits no edema.       Left lower leg: He exhibits no edema.  Neurological: He is alert and oriented to person, place, and time.  Skin: Skin is warm and dry. Capillary refill takes less than 2 seconds. No rash noted.  Psychiatric: His mood appears anxious.  Nursing note and vitals reviewed.    ED Treatments / Results  Labs (all labs ordered are listed, but only abnormal results are displayed) Labs Reviewed  CBC WITH DIFFERENTIAL/PLATELET - Abnormal; Notable for the following components:      Result Value   WBC 11.3 (*)  Hemoglobin 17.8 (*)    MCHC 36.1 (*)    Monocytes Absolute 1.7 (*)    Abs Immature Granulocytes 0.18 (*)    All other components within normal limits  COMPREHENSIVE METABOLIC PANEL - Abnormal; Notable for the following components:   Potassium 3.4 (*)    Glucose, Bld 154 (*)    AST 46 (*)    ALT 51 (*)    All other components within normal limits  ETHANOL - Abnormal; Notable for the following components:   Alcohol, Ethyl (B) 224 (*)    All other components within normal limits  I-STAT CHEM 8, ED - Abnormal; Notable for the following components:   Potassium 3.4 (*)    BUN 5 (*)    Glucose, Bld 157 (*)    Calcium, Ion 0.99 (*)    All other components within normal limits  CULTURE, BLOOD (ROUTINE X 2)  CULTURE, BLOOD (ROUTINE X 2)  URINE CULTURE  BRAIN NATRIURETIC PEPTIDE  LIPASE, BLOOD  TROPONIN I  URINALYSIS, ROUTINE W REFLEX MICROSCOPIC  I-STAT TROPONIN, ED  POC OCCULT BLOOD, ED    EKG EKG Interpretation  Date/Time:  Friday December 29 2017 17:04:08 EDT Ventricular Rate:  83 PR Interval:    QRS  Duration: 108 QT Interval:  360 QTC Calculation: 421 R Axis:   -146 Text Interpretation:  Sinus rhythm  Markedly posterior QRS axis No STEMI.  Confirmed by Alona Bene [phone]) on 12/29/2017 5:14:39 PM   Radiology Dg Chest 2 View  Result Date: 12/29/2017 CLINICAL DATA:  Shortness of breath with wheezing EXAM: CHEST - 2 VIEW COMPARISON:  None. FINDINGS: The heart size and mediastinal contours are within normal limits. Both lungs are clear. The visualized skeletal structures are unremarkable. IMPRESSION: No active cardiopulmonary disease. Electronically Signed   By: Jasmine Pang M.D.   On: 12/29/2017 18:11    Procedures Procedures (including critical care time)  Medications Ordered in ED Medications  thiamine (VITAMIN B-1) tablet 100 mg ( Oral See Alternative 12/29/17 1745)    Or  thiamine (B-1) injection 100 mg (100 mg Intravenous Given 12/29/17 1745)  lactated ringers bolus 1,000 mL (0 mLs Intravenous Stopped 12/29/17 2041)     Initial Impression / Assessment and Plan / ED Course  I have reviewed the triage vital signs and the nursing notes.  Pertinent labs & imaging results that were available during my care of the patient were reviewed by me and considered in my medical decision making (see chart for details).     Patient is a 57 year old male with PMHx of HTN, EtOH abuse, and depression who presents with worsening SOB and productive cough x 4 days.  Given albuterol, solumedrol, and ASA by EMS PTA.  Patient with multiple other complaints including vomiting, black stools, and decreased PO intake.  On arrival he is HDS and afebrile.  Exam as above.  IVF given.  EKG shows NSR with a rate of 83 and no evidence of acute ischemic changes, abnormal intervals, or dysrhythmia.   Labs obtained and significant for mild hypokalemia of 3.5 and mild LFT elevation as AST 46 and ALT 51.  Kidney function stable.  Lipase wnl therefore doubt acute pancreatitis.  Patient appears hemoconcentrated  2/2 dehydration and poor PO intake as WBC 11.3 and Hgb 17.8.  No source of infection identified as CXR without focal consolidation to suggest PNA.  Doubt acute bleed as fecal occult negative without gross melena or blood on rectal exam.  Hgb stable at 17.9.  No  hematemesis or hemoptysis reported.  Abdomen grossly benign.  With regards to left lower chest pain, doubt ACS as it has been constant since August, EKG without acute ischemia, and troponin x 2 negative.  No signs of acute heart failure on exam, CXR, or BNP.  Unlikely pneumothorax, no findings on CXR. Unlikely pericarditis/myocarditis, does not fit clinical picture, chest pain not exertional, no EKG findings to support.  Unlikely dissection as no ripping tearing back pain, no neck pain, no pulse deficit, and no concerning FHx.  Initial concern for EtOH withdrawal as CIWA 11 therefore patient given thiamine and ativan.  Ethanol level resulted at 224 and patient now calm.  Question albuterol as inciting cause of anxiety and tremors.  Etiology of symptoms likely 2/2 untreated COPD, alcohol abuse, and dehydration.  Patient able to tolerate PO and requesting to leave.  Stable for d/c home.  Old records reviewed.  Imaging and labs reviewed and interpreted by myself and attending and used in the MDM.  Addressed patient question and concerns.  Reviewed discharged instructions with strict precautions given.  Advised patient to schedule follow-up with primary care provider.  Patient verbalized understanding and agrees with plan.  Patient stable at discharge.  The plan for this patient was discussed with Dr. Jacqulyn Bath who voiced agreement and who oversaw evaluation and treatment of this patient.  Final Clinical Impressions(s) / ED Diagnoses   Final diagnoses:  Cough  Alcohol abuse    ED Discharge Orders    None       Abelardo Diesel, MD 12/30/17 1610    Maia Plan, MD 12/30/17 1015

## 2017-12-29 NOTE — Discharge Instructions (Signed)
Follow up with primary care doctor in 2 days.  If you desire alcohol detox discuss with your primary care doctor or detox center.  Take home medications as previously prescribed.  Drink plenty of fluids.  Return to the ED for any worsening or other concerns.

## 2017-12-29 NOTE — ED Triage Notes (Signed)
Pt here from home c/o shortness of breath, chest pain, nausea and vomiting. Pt also c/o productive cough. Reports not eating since Monday. Given 324 asa for CP by EMS. Also given total of 10mg  albuterol, 125 solumedrol. Per EMS, pt is non compliant with meds and states "I don't believe in meds prescribed." Pt reports "trying to taper off normal intake of alcohol." VSS at this time.

## 2018-01-03 LAB — CULTURE, BLOOD (ROUTINE X 2)
CULTURE: NO GROWTH
Culture: NO GROWTH
Special Requests: ADEQUATE
Special Requests: ADEQUATE

## 2018-02-22 DIAGNOSIS — F1010 Alcohol abuse, uncomplicated: Secondary | ICD-10-CM

## 2018-02-22 DIAGNOSIS — R69 Illness, unspecified: Secondary | ICD-10-CM

## 2018-02-22 DIAGNOSIS — Z720 Tobacco use: Secondary | ICD-10-CM

## 2018-02-22 DIAGNOSIS — J441 Chronic obstructive pulmonary disease with (acute) exacerbation: Secondary | ICD-10-CM

## 2018-02-22 DIAGNOSIS — I10 Essential (primary) hypertension: Secondary | ICD-10-CM

## 2018-07-08 ENCOUNTER — Inpatient Hospital Stay (HOSPITAL_COMMUNITY)
Admission: AD | Admit: 2018-07-08 | Discharge: 2018-07-10 | DRG: 885 | Disposition: A | Discharge status: Home / Self Care | Payer: No Typology Code available for payment source | Source: Other Acute Inpatient Hospital | Attending: Psychiatry | Admitting: Psychiatry

## 2018-07-08 ENCOUNTER — Other Ambulatory Visit: Payer: Self-pay

## 2018-07-08 ENCOUNTER — Encounter (HOSPITAL_COMMUNITY): Payer: Self-pay | Admitting: Behavioral Health

## 2018-07-08 DIAGNOSIS — F17210 Nicotine dependence, cigarettes, uncomplicated: Secondary | ICD-10-CM | POA: Diagnosis present

## 2018-07-08 DIAGNOSIS — J449 Chronic obstructive pulmonary disease, unspecified: Secondary | ICD-10-CM | POA: Diagnosis present

## 2018-07-08 DIAGNOSIS — F411 Generalized anxiety disorder: Secondary | ICD-10-CM | POA: Diagnosis present

## 2018-07-08 DIAGNOSIS — Z818 Family history of other mental and behavioral disorders: Secondary | ICD-10-CM

## 2018-07-08 DIAGNOSIS — F10239 Alcohol dependence with withdrawal, unspecified: Secondary | ICD-10-CM | POA: Diagnosis present

## 2018-07-08 DIAGNOSIS — Z811 Family history of alcohol abuse and dependence: Secondary | ICD-10-CM | POA: Diagnosis not present

## 2018-07-08 DIAGNOSIS — R45851 Suicidal ideations: Secondary | ICD-10-CM | POA: Diagnosis present

## 2018-07-08 DIAGNOSIS — F331 Major depressive disorder, recurrent, moderate: Secondary | ICD-10-CM | POA: Diagnosis present

## 2018-07-08 DIAGNOSIS — Z880 Allergy status to penicillin: Secondary | ICD-10-CM | POA: Diagnosis not present

## 2018-07-08 DIAGNOSIS — F1020 Alcohol dependence, uncomplicated: Secondary | ICD-10-CM | POA: Diagnosis present

## 2018-07-08 DIAGNOSIS — F332 Major depressive disorder, recurrent severe without psychotic features: Principal | ICD-10-CM

## 2018-07-08 DIAGNOSIS — Z888 Allergy status to other drugs, medicaments and biological substances status: Secondary | ICD-10-CM

## 2018-07-08 DIAGNOSIS — Z8249 Family history of ischemic heart disease and other diseases of the circulatory system: Secondary | ICD-10-CM

## 2018-07-08 DIAGNOSIS — I10 Essential (primary) hypertension: Secondary | ICD-10-CM | POA: Diagnosis present

## 2018-07-08 DIAGNOSIS — Z823 Family history of stroke: Secondary | ICD-10-CM

## 2018-07-08 HISTORY — DX: Chronic obstructive pulmonary disease, unspecified: J44.9

## 2018-07-08 MED ORDER — ACETAMINOPHEN 325 MG PO TABS: 650.0000 mg | ORAL_TABLET | Freq: Four times a day (QID) | ORAL | Status: DC | PRN

## 2018-07-08 MED ORDER — TRAZODONE HCL 50 MG PO TABS: 50.0000 mg | ORAL_TABLET | Freq: Every evening | ORAL | Status: DC | PRN

## 2018-07-08 MED ORDER — VITAMIN B-1 100 MG PO TABS
100.0000 mg | ORAL_TABLET | Freq: Every day | ORAL | Status: DC
  Administered 2018-07-09 – 2018-07-10 (×2): 100 mg via ORAL
  Filled 2018-07-08 (×5): qty 1

## 2018-07-08 MED ORDER — HYDROCHLOROTHIAZIDE 25 MG PO TABS
25.0000 mg | ORAL_TABLET | Freq: Every day | ORAL | Status: DC
  Administered 2018-07-09 – 2018-07-10 (×2): 25 mg via ORAL
  Filled 2018-07-08 (×5): qty 1

## 2018-07-08 MED ORDER — VITAMIN D 25 MCG (1000 UNIT) PO TABS
1000.0000 [IU] | ORAL_TABLET | Freq: Every day | ORAL | Status: DC
  Administered 2018-07-09 – 2018-07-10 (×2): 1000 [IU] via ORAL
  Filled 2018-07-08 (×3): qty 1
  Filled 2018-07-08: qty 7
  Filled 2018-07-08: qty 1

## 2018-07-08 MED ORDER — ERGOCALCIFEROL 200 MCG/ML PO SOLN: 1250.0000 ug | Freq: Every day | ORAL | Status: DC

## 2018-07-08 MED ORDER — NICOTINE POLACRILEX 2 MG MT GUM: 2.0000 mg | CHEWING_GUM | OROMUCOSAL | Status: DC | PRN

## 2018-07-08 MED ORDER — VITAMIN D 25 MCG (1000 UNIT) PO TABS
1000.0000 [IU] | ORAL_TABLET | Freq: Every day | ORAL | Status: DC
  Filled 2018-07-08 (×2): qty 1

## 2018-07-08 MED ORDER — LISINOPRIL 10 MG PO TABS
10.0000 mg | ORAL_TABLET | Freq: Every day | ORAL | Status: DC
  Administered 2018-07-09 – 2018-07-10 (×2): 10 mg via ORAL
  Filled 2018-07-08 (×5): qty 1

## 2018-07-08 MED ORDER — FOLIC ACID 1 MG PO TABS
1.0000 mg | ORAL_TABLET | Freq: Every day | ORAL | Status: DC
  Administered 2018-07-08 – 2018-07-10 (×3): 1 mg via ORAL
  Filled 2018-07-08 (×6): qty 1

## 2018-07-08 MED ORDER — LORAZEPAM 1 MG PO TABS
1.0000 mg | ORAL_TABLET | Freq: Four times a day (QID) | ORAL | Status: DC | PRN
  Administered 2018-07-08 – 2018-07-09 (×2): 1 mg via ORAL
  Filled 2018-07-08 (×2): qty 1

## 2018-07-08 MED ORDER — ALBUTEROL SULFATE HFA 108 (90 BASE) MCG/ACT IN AERS: 1.0000 | INHALATION_SPRAY | Freq: Four times a day (QID) | RESPIRATORY_TRACT | Status: DC | PRN

## 2018-07-08 MED ORDER — TRAZODONE HCL 100 MG PO TABS
100.0000 mg | ORAL_TABLET | Freq: Every evening | ORAL | Status: DC | PRN
  Administered 2018-07-08 – 2018-07-09 (×2): 100 mg via ORAL
  Filled 2018-07-08: qty 7
  Filled 2018-07-08 (×2): qty 1

## 2018-07-08 MED ORDER — SERTRALINE HCL 25 MG PO TABS
25.0000 mg | ORAL_TABLET | Freq: Every day | ORAL | Status: DC
  Administered 2018-07-08 – 2018-07-09 (×2): 25 mg via ORAL
  Filled 2018-07-08 (×4): qty 1

## 2018-07-08 MED ORDER — HYDROXYZINE HCL 25 MG PO TABS
25.0000 mg | ORAL_TABLET | Freq: Four times a day (QID) | ORAL | Status: DC | PRN
  Administered 2018-07-09 – 2018-07-10 (×2): 25 mg via ORAL
  Filled 2018-07-08 (×2): qty 1

## 2018-07-08 MED ORDER — ALUM & MAG HYDROXIDE-SIMETH 200-200-20 MG/5ML PO SUSP: 30.0000 mL | ORAL | Status: DC | PRN

## 2018-07-08 MED ORDER — MAGNESIUM HYDROXIDE 400 MG/5ML PO SUSP: 30.0000 mL | Freq: Every day | ORAL | Status: DC | PRN

## 2018-07-08 NOTE — BH Assessment (Signed)
Assessment Note  Zachary [name] is a 58 y.o. male who presented to [facility] on 07/05/2018 with complaint of despondency, alcohol use, and intentional overdose.  Pt is accepted to [facility] 302-2.  Accepting is Molli Knock, NP.  Attending is Dr. Jola Babinski.  Below is the narrative completed by TTS staff on 07/05/2018.  The pt is a 58 year old male who came in after overdosing on Lisinopril and cutting his arm.  He stated he was upset because he is having severe anxiety.  He was previously taking Klonopin and ran out about a week or two ago according to the Patient.  He has been drinking three bottles of wine a day.  Today he had one bottle of wine before coming to the emergency room.  He stated he has been drinking heavily for the past few years.  He isn't seeing a counselor currently.  He was last inpatient at [facility] 07/2017.  The pt lives alone.  He denies self-harm, HI and legal issues.  When asked about abuse, he paused and stated, ''Not to speak of.''  The patient stated he has had hallucinations in the past that were due to alcohol withdrawal.  He isn't currently having hallucinations.  He stated that he isn't sleeping or eating well.  He reports feeling depressed and having little interest in pleasurable things.  The pt stated he has been drinking heavily for the past 2.5 years.  He denies any seizures in the past when he has detoxed.  The pt is dressed in a hospital gown. He is oriented.  His speech is unremarkable.  He was cooperative during session.  Diagnosis: F33.2 Major Depressive Disorder, Recurrent, Severe; F10.20 Alcohol Use Disorder, Severe   Past Medical History:  Past Medical History:  Diagnosis Date  . Alcohol abuse   . Hypertension     Past Surgical History:  Procedure Laterality Date  . COLONOSCOPY    . MOUTH SURGERY    . NO PAST SURGERIES      Family History:  Family History  Problem Relation Age of Onset  . Heart attack Paternal Grandfather   . Stroke Mother   .  Heart failure Mother   . Heart failure Father     Social History:  reports that he has been smoking cigarettes. He has been smoking about 2.00 packs per day. He has never used smokeless tobacco. He reports current alcohol use. He reports current drug use. Drug: Marijuana.  Additional Social History:  Alcohol / Drug Use Pain Medications: See MAR Prescriptions: See MAR Over the Counter: See MAR History of alcohol / drug use?: Yes Substance #1 Name of Substance 1: Alcohol 1 - Last Use / Amount: 07/04/2018 Substance #2 Name of Substance 2: Marijuana  CIWA:   COWS:    Allergies:  Allergies  Allergen Reactions  . Penicillins Other (See Comments)    Childhood allergy Reaction:  Unknown  Has patient had a PCN reaction causing immediate rash, facial/tongue/throat swelling, SOB or lightheadedness with hypotension: Unknown Has patient had a PCN reaction causing severe rash involving mucus membranes or skin necrosis: Unknown Has patient had a PCN reaction that required hospitalization Unknown Has patient had a PCN reaction occurring within the last 10 years: No If all of the above answers are "NO", then may proceed with Cephalosporin use.  . Statins Palpitations    Home Medications:  No medications prior to admission.    OB/GYN Status:  No LMP for male patient.  General Assessment Data  [facility] TTS Assessment: Out of system Is this a Tele or Face-to-Face Assessment?: Tele Assessment Is this an Initial Assessment or a Re-assessment for this encounter?: Initial Assessment Patient Accompanied by:: N/A Language Other than English: No Living Arrangements: Other (Comment) What gender do you identify as?: Male Marital status: Divorced Pregnancy Status: No Living Arrangements: Alone Can pt return to current living arrangement?: Yes Admission Status: Voluntary Is patient capable of signing voluntary admission?: Yes Referral Source:  Self/Family/Friend Insurance type: [facility]     Crisis Care Plan Living Arrangements: Alone Name of Psychiatrist: None currently Name of Therapist: None currently  Education Status Is patient currently in school?: No Is the patient employed, unemployed or receiving disability?: Employed  Risk to self with the past 6 months Suicidal Ideation: Yes-Currently Present Has patient been a risk to self within the past 6 months prior to admission? : No Suicidal Intent: Yes-Currently Present Has patient had any suicidal intent within the past 6 months prior to admission? : No Is patient at risk for suicide?: Yes Suicidal Plan?: Yes-Currently Present Has patient had any suicidal plan within the past 6 months prior to admission? : No Specify Current Suicidal Plan: OD; cutting self Access to Means: Yes Specify Access to Suicidal Means: Prescribed meds; blades What has been your use of drugs/alcohol within the last 12 months?: Alcohol Other Self Harm Risks: Alcohol use; history of drug use Intentional Self Injurious Behavior: Cutting Recent stressful life event(s): Other (Comment)(COVID-19; isolation) Persecutory voices/beliefs?: No Depression: Yes Depression Symptoms: Despondent, Feeling worthless/self pity, Fatigue, Insomnia, Loss of interest in usual pleasures, Isolating Substance abuse history and/or treatment for substance abuse?: Yes Suicide prevention information given to non-admitted patients: Not applicable  Risk to Others within the past 6 months Homicidal Ideation: No Does patient have any lifetime risk of violence toward others beyond the six months prior to admission? : No Thoughts of Harm to Others: No Current Homicidal Intent: No Current Homicidal Plan: No Access to Homicidal Means: No History of harm to others?: No Assessment of Violence: None Noted Does patient have access to weapons?: No Criminal Charges Pending?: No Does patient have a court date: No Is  patient on probation?: No  Psychosis Hallucinations: None noted Delusions: None noted  Mental Status Report Eye Contact: Good Motor Activity: Freedom of movement, Unremarkable Speech: Logical/coherent Level of Consciousness: Alert Mood: Depressed Affect: Appropriate to circumstance Anxiety Level: Moderate Thought Processes: Relevant, Coherent Judgement: Partial Orientation: Person, Place, Time, Situation Obsessive Compulsive Thoughts/Behaviors: None  Cognitive Functioning Concentration: Normal Memory: Remote Intact, Recent Intact Is patient IDD: No Insight: Fair Impulse Control: Poor  ADLScreening [facility] Assessment Services) Patient's cognitive ability adequate to safely complete daily activities?: Yes Patient able to express need for assistance with ADLs?: Yes Independently performs ADLs?: Yes (appropriate for developmental age)  Prior Inpatient Therapy Prior Inpatient Therapy: Yes Prior Therapy Dates: 07/2017 Prior Therapy [facility]ilty[facility]Provider([facility] Reason for Treatment: Depression, alcohol use  Prior Outpatient Therapy Prior Outpatient Therapy: Yes Does patient have an ACCT team?: No Does patient have Intensive In-House Services?  : No Does patient have [facility] services? : No Does patient have P4CC services?: No  ADL Screening (condition at time of admission) Patient's cognitive ability adequate to safely complete daily activities?: Yes Is the patient deaf or have difficulty hearing?: No Does the patient have difficulty seeing, even when wearing glasses/contacts?: No Does the patient have difficulty concentrating, remembering, or making decisions?: No Patient able to express need for assistance  with ADLs?: Yes Does the patient have difficulty dressing or bathing?: No Independently performs ADLs?: Yes (appropriate for developmental age) Does the patient have difficulty walking or climbing stairs?: No Weakness of Legs: None Weakness of Arms/Hands:  None  Home Assistive Devices/Equipment Home Assistive Devices/Equipment: None  Therapy Consults (therapy consults require a physician order) PT Evaluation Needed: No OT Evalulation Needed: No SLP Evaluation Needed: No Abuse/Neglect Assessment (Assessment to be complete while patient is alone) Abuse/Neglect Assessment Can Be Completed: Yes Physical Abuse: Denies Verbal Abuse: Denies Sexual Abuse: Denies Exploitation of patient/patient's resources: Denies Self-Neglect: Denies Values / Beliefs Cultural Requests During Hospitalization: None, Other (comment) Spiritual Requests During Hospitalization: None Consults Spiritual Care Consult Needed: No Social Work Consult Needed: No Merchant navy officer (For Healthcare) Does Patient Have a Medical Advance Directive?: No          Disposition:  Disposition Initial Assessment Completed for this Encounter: Yes Disposition of Patient: Admit Type of inpatient treatment program: Adult(Pt accepted to [facility] 302-2)  On Site Evaluation by:   Reviewed with Physician:    Dorris Fetch Donnalyn Juran 07/08/2018 10:24 AM

## 2018-07-08 NOTE — BHH Counselor (Signed)
Adult Comprehensive Assessment  Patient ID: Zachary [name], male   DOB: 11-02-1960, 58 y.o.   MRN: [id number]  Information Source: Information source: Patient  Current Stressors:  Patient states their primary concerns and needs for treatment are:: "Can't get on top of my anxiety, it drives me to drink." Patient states their goals for this hospitilization and ongoing recovery are:: "Don't want to drink any more.  Was sober for 8 years prior to 2018.  Want to walk away with better tools for anxiety.  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  A lot of cancellations due to COVID-19, but now picking up again. Family Relationships: Often strained with older son.  Cannot talk to father. Financial / Lack of resources (include bankruptcy): Less income because of COVID-19 Housing / Lack of housing: Moved into abandoned house Feb 11, 2018.  It was completely wrecked and infested with mice.  He and son have been working on it while he has been living in it since then.  Lived with no heat throughout winter. Physical health (include injuries & life threatening diseases): Some back pain, less than before.  Hypertension. Social relationships: Had a relationship with another addict that did not go well.  Is missing his dog. Substance abuse: Having trouble staying away from alcohol.  Will drink even when does not get drunk, but knows he cannot drink at all.  Recently was overusing a Klonopin prescription given to him by PCP for anxiety also. Bereavement / Loss: Mother died 12 years ago - as he has been clearing out the abandoned house where she had previously lived, he has been going through her possessions.  Found a suicide note that listed who would get what possessions, did not mention him, which was painflu.  Living/Environment/Situation:  Living Arrangements: Alone Living  conditions (as described by patient or guardian): Poor conditions in an abandoned house where he is having to take tons of trash out, get rid of mice, rebuild, etc. Who else lives in the home?: Nobody How long has patient lived in current situation?: Since 02/11/18 What is atmosphere in current home: Other (Comment)(Undergoing positive changes, but difficult)  Family History:  Marital status: Divorced Divorced, when?: 2002 What types of issues is patient dealing with in the relationship?: N/A Additional relationship information: Broke up with a male in the recent past due to shared addictions. Are you sexually active?: Yes What is your sexual orientation?: heterosexual Has your sexual activity been affected by drugs, alcohol, medication, or emotional stress?: no Does patient have children?: Yes How many children?: 3 How is patient's relationship with their children?: Often strained with older son.  Younger son relationship is much improved, although he enables pt. Relationship w daughter is wonderful although long distant.  Childhood History:  By whom was/is the patient raised?: Both parents Additional childhood history information: born w cleft lip; "it was the family secret, they would hide me when company would come over; I was told that I hurt my lip while climbing in the closet - they were Catholic and a birth defect was evidence of sin - my sister told me when I was 27 what had really happened" struggled w self acceptance due to cleft lip - difficulty w peer relationships "girls didn't want to date me", felt shamed by family, "I blamed myself for my lip all these years and then I found out  it wasn't my fault" Description of patient's relationship with caregiver when they were a child: not great w father, ok w mother Patient's description of current relationship with people who raised him/her: Mother is deceased.  Poor relationship with father who let mother's house decay to the point it  is in now. How were you disciplined when you got in trouble as a child/adolescent?: yelling Does patient have siblings?: Yes Number of Siblings: 2 Description of patient's current relationship with siblings: one sister died of cancer; remaining sister is supportive but overwhelmed with her own problems including current foreclosure Did patient suffer any verbal/emotional/physical/sexual abuse as a child?: Yes(Emotional - hidden by parents due to their shame over his cleft palate.) Did patient suffer from severe childhood neglect?: No Has patient ever been sexually abused/assaulted/raped as an adolescent or adult?: No Was the patient ever a victim of a crime or a disaster?: No Witnessed domestic violence?: No Has patient been effected by domestic violence as an adult?: No  Education:  Highest grade of school patient has completed: High school diploma Currently a student?: No Learning disability?: No  Employment/Work Situation:   Employment situation: Employed 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  Skipping out on work. 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  Klonopin was prescribed, has been overusing. Alcohol/Substance  Abuse Treatment Hx: Past detox, Past Tx, Inpatient, Past Tx, Outpatient If yes, describe treatment: [facility] multiple times, [facility] multiple times, [facility], [facility], [facility] 2018 Has alcohol/substance abuse ever caused legal problems?: Yes  Social Support System:   Patient's Community Support System: Fair Describe Community Support System: Youngest son now very supportive, oldest son sometimes supportive, daughter supportive long-distance, sister at times Type of faith/religion: [employer] Kn[employer], improving from earlier disillusionment How does patient's faith help to cope with current illness?: Rely on Christian tenets, prayer, reads the Bible, believes God has a Archivistplan  Leisure/Recreation:   Leisure and Hobbies: Fixing up house, spend time w dog  Strengths/Needs:   What is the patient's perception of their strengths?: Highly productive, love kids & grandkids, generous, Programmer, multimediagreat worker, coordinating, Community education officercraftsmanship, often can manage stress & turmoil, insightful Patient states they can use these personal strengths during their treatment to contribute to their recovery: Temper expectations of self and others Patient states these barriers may affect/interfere with their treatment: None Patient states these barriers may affect their return to the community: None Other important information patient would like considered in planning for their treatment: None  Discharge Plan:   Currently receiving community mental health services: No(PCP did prescribe Klonopin, but does not want to return there.) Patient states concerns and preferences for aftercare planning are: [facility] for outpatient med mgmt and therapy Patient states they will know when they are safe and ready for discharge  when: Feels ready now, has signed 72-hour discharge 4/26 at 1340. Does patient have access to transportation?: Yes(One of sons) Does patient have financial barriers related to discharge medications?:  Yes Patient description of barriers related to discharge medications: Limited income lately, no insurance Will patient be returning to same living situation after discharge?: Yes  Summary/Recommendations:   Summary and Recommendations (to be completed by the evaluator): Patient is a 58yo male admitted with psychiatric history significant for alcohol dependence, generalized anxiety and depression who presented to [facility] 07/05/2018 due to suicidal ideation and a deliberate overdose on his hypertension medication.  Primary stressors include loss of income due to COVID-19, relapse on alcohol, overuse of a Klonopin prescription from PCP he had received for his anxiety, strained relationship with father and one son, and his housing situation.  In December 2019 he had to move into his deceased mother's abandoned home and since then has been living there while trying to remove the detritus including mice and make it habitable once again.  Patient will benefit from crisis stabilization, medication evaluation, group therapy and psychoeducation, in addition to case management for discharge planning. At discharge it is recommended that Patient adhere to the established discharge plan and continue in treatment.  Lynnell Chad. 07/08/2018

## 2018-07-08 NOTE — Progress Notes (Signed)
[facility] Admission Note: Pt is a pleasant, divorced, 58 year old recovering from alcohol addiction.  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  PMH includes COPD, HTN, anxiety, and depression.  He verbalizes wanting help and states that his sons are supportive of him.  Pt is able to contract for safety and currently denies SI/HI/AVH  Pt admitted to the unit per routine, searched, and introduced into the milieu.   Level 3 checks initiated and maintained.  EKG (sinus rhythm) done and placed on the chart.  Pt receptive to interventions.  Safety maintained.

## 2018-07-08 NOTE — Progress Notes (Signed)
[facility] Group Notes:  (Nursing/MHT/Case Management/Adjunct)  Date:  07/08/2018  Time:  2030  Type of Therapy:  wrap up group  Participation Level:  Active  Participation Quality:  Appropriate, Attentive, Sharing and Supportive  Affect:  Anxious and Depressed  Cognitive:  Appropriate  Insight:  Improving  Engagement in Group:  Engaged  Modes of Intervention:  Clarification, Education and Support  Summary of Progress/Problems: Pt reported being happy he was transferred to behavioral health and he realizes that although he had wanted to go back home he is not ready to be at home. If pt could change any one thing about his life it would be to change how his brain responds to stress and anxiety. Pt is grateful for his dog best friend.   Johann Capers S 07/08/2018, 10:02 PM

## 2018-07-08 NOTE — H&P (Signed)
Psychiatric Admission Assessment Adult  Patient Identification: Zachary [name] MRN:  [id number] Date of Evaluation:  07/08/2018 Chief Complaint:  MDD ETOH Principal Diagnosis: Major depressive disorder, recurrent severe without psychotic features (HCC) Diagnosis:  Principal Problem:   Major depressive disorder, recurrent severe without psychotic features (HCC) Active Problems:   Alcohol dependence with withdrawal (HCC)  History of Present Illness: Patient is seen and examined.  Patient is a 58 year old male with a longstanding past psychiatric history significant for alcohol dependence, generalized anxiety and depression who presented to the [facility] emergency department on 07/05/2018 with suicidal ideation.  The patient stated that he had relapsed on alcohol, was feeling significantly anxious, and was feeling helpless and hopeless.  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  He has a longstanding history of alcohol dependence, and had recent elapsed.  He had recently [place] for health issues, and he had been prescribed clonazepam 1 mg p.o. 3 times daily as needed.  He stated that he used those up early, and was combining those with alcohol.  He stated that recently he had been drinking 3 bottles of wine a day.  He had also had psychosocial stressors of having to move from a home that he lived in for many years to take over home that his family had a banded years ago.  He stated he took approximately 7 lisinopril/hydrochlorothiazide tablets and an overdose.  He was brought to the [facility] for evaluation and stabilization.  He was kept there for approximately 2 to 3 days before to being transferred to our facility.  He was admitted to the hospital for evaluation and stabilization.  Associated Signs/Symptoms: Depression Symptoms:  depressed mood, anhedonia, insomnia, psychomotor  agitation, fatigue, feelings of worthlessness/guilt, difficulty concentrating, hopelessness, suicidal thoughts without plan, suicidal attempt, anxiety, panic attacks, loss of energy/fatigue, (Hypo) Manic Symptoms:  Impulsivity, Irritable Mood, Anxiety Symptoms:  Excessive Worry, Psychotic Symptoms:  denied PTSD Symptoms: Negative Total Time spent with patient: 45 minutes  Past Psychiatric History: Patient has had multiple psychiatric hospitalizations in the past.  Review of the electronic medical record revealed his last hospitalization at our facility was on 12/30/2016.  His last psychiatric hospitalization in general was at [facility] in May 2019.  He has been treated with multiple medications in the past.  He has received Paxil in the past.  Is the patient at risk to self? Yes.    Has the patient been a risk to self in the past 6 months? Yes.    Has the patient been a risk to self within the distant past? Yes.    Is the patient a risk to others? No.  Has the patient been a risk to others in the past 6 months? No.  Has the patient been a risk to others within the distant past? No.   Prior Inpatient Therapy: Prior Inpatient Therapy: Yes Prior Therapy Dates: 07/2017 Prior Therapy Facilty/Provider(s): [facility] Reason for Treatment: Depression, alcohol use Prior Outpatient Therapy: Prior Outpatient Therapy: Yes Does patient have an ACCT team?: No Does patient have Intensive In-House Services?  : No Does patient have [facility] services? : No Does patient have P4CC services?: No  Alcohol Screening:   Substance Abuse History in the last 12 months:  Yes.   Consequences of Substance Abuse: Negative Previous Psychotropic Medications: Yes  Psychological Evaluations: Yes  Past Medical History:  Past Medical History:  Diagnosis Date  . Alcohol abuse   . Hypertension     Past Surgical History:  Procedure Laterality Date  . COLONOSCOPY    . MOUTH SURGERY    . NO PAST  SURGERIES     Family History:  Family History  Problem Relation Age of Onset  . Heart attack Paternal Grandfather   . Stroke Mother   . Heart failure Mother   . Heart failure Father    Family Psychiatric  History: Patient stated that there is alcoholism on his mother side of the family, and depression as well. Tobacco Screening:   Social History:  Social History   Substance and Sexual Activity  Alcohol Use Yes   Comment: BAC was .23 on admission     Social History   Substance and Sexual Activity  Drug Use Yes  . Types: Marijuana   Comment: UDS was negative    Additional Social History: Marital status: Divorced    Pain Medications: See MAR Prescriptions: See MAR Over the Counter: See MAR History of alcohol / drug use?: Yes Name of Substance 1: Alcohol 1 - Last Use / Amount: 07/04/2018 Name of Substance 2: Marijuana                Allergies:   Allergies  Allergen Reactions  . Penicillins Other (See Comments)    Childhood allergy Reaction:  Unknown  Has patient had a PCN reaction causing immediate rash, facial/tongue/throat swelling, SOB or lightheadedness with hypotension: Unknown Has patient had a PCN reaction causing severe rash involving mucus membranes or skin necrosis: Unknown Has patient had a PCN reaction that required hospitalization Unknown Has patient had a PCN reaction occurring within the last 10 years: No If all of the above answers are "NO", then may proceed with Cephalosporin use.  . Statins Palpitations   Lab Results: No results found for this or any previous visit (from the past 48 hour(s)).  Blood Alcohol level:  Lab Results  Component Value Date   ETH 224 (H) 12/29/2017   ETH 130 (H) 09/17/2017    Metabolic Disorder Labs:  Lab Results  Component Value Date   HGBA1C 5.3 10/28/2015   MPG 105 10/28/2015   MPG 111 04/18/2007   No results found for: PROLACTIN Lab Results  Component Value Date   CHOL 309 (H) 10/29/2015   TRIG 153  (H) 10/29/2015   HDL 94 10/29/2015   CHOLHDL 3.3 10/29/2015   VLDL 31 10/29/2015   LDLCALC 184 (H) 10/29/2015   LDLCALC (H) 04/19/2007    142        Total Cholesterol/HDL:CHD Risk Coronary Heart Disease Risk Table                     Men   Women  1/2 Average Risk   3.4   3.3    Current Medications: Current Facility-Administered Medications  Medication Dose Route Frequency Provider Last Rate Last Dose  . acetaminophen (TYLENOL) tablet 650 mg  650 mg Oral Q6H PRN Antonieta Pertlary, Greg Lawson, MD      . albuterol (VENTOLIN HFA) 108 (90 Base) MCG/ACT inhaler 1-2 puff  1-2 puff Inhalation Q6H PRN Antonieta Pertlary, Greg Lawson, MD      . alum & mag hydroxide-simeth (MAALOX/MYLANTA) 200-200-20 MG/5ML suspension 30 mL  30 mL Oral Q4H PRN Antonieta Pertlary, Greg Lawson, MD      . cholecalciferol (VITAMIN D3) tablet 1,000 Units  1,000 Units Oral Daily Antonieta Pertlary, Greg Lawson, MD      .  hydrochlorothiazide (HYDRODIURIL) tablet 25 mg  25 mg Oral Daily Antonieta Pert, MD      . hydrOXYzine (ATARAX/VISTARIL) tablet 25 mg  25 mg Oral Q6H PRN Antonieta Pert, MD      . lisinopril (ZESTRIL) tablet 10 mg  10 mg Oral Daily Antonieta Pert, MD      . LORazepam (ATIVAN) tablet 1 mg  1 mg Oral Q6H PRN Antonieta Pert, MD      . magnesium hydroxide (MILK OF MAGNESIA) suspension 30 mL  30 mL Oral Daily PRN Antonieta Pert, MD      . nicotine polacrilex (NICORETTE) gum 2 mg  2 mg Oral PRN Antonieta Pert, MD      . traZODone (DESYREL) tablet 50 mg  50 mg Oral QHS PRN Antonieta Pert, MD       PTA Medications: Medications Prior to Admission  Medication Sig Dispense Refill Last Dose  . cholecalciferol (VITAMIN D3) 25 MCG (1000 UT) tablet Take 1,000 Units by mouth daily.   Past Week at Unknown time  . clonazePAM (KLONOPIN) 0.5 MG tablet Take 1 tablet by mouth 2 (two) times a day.   Past Week at Unknown time  . lisinopril-hydrochlorothiazide (ZESTORETIC) 10-12.5 MG tablet Take 1 tablet by mouth daily.   Past Week at Unknown  time  . traZODone (DESYREL) 100 MG tablet Take 1 tablet by mouth daily.   Past Week at Unknown time  . Vitamin D, Ergocalciferol, (DRISDOL) 1.25 MG (50000 UT) CAPS capsule Take 1 capsule by mouth every 7 (seven) days. Monday   Past Week at Unknown time    Musculoskeletal: Strength & Muscle Tone: within normal limits Gait & Station: normal Patient leans: N/A  Psychiatric Specialty Exam: Physical Exam  Nursing note and vitals reviewed. Constitutional: He is oriented to person, place, and time. He appears well-developed and well-nourished.  HENT:  Head: Normocephalic and atraumatic.  Respiratory: Effort normal.  Neurological: He is alert and oriented to person, place, and time.    ROS  There were no vitals taken for this visit.There is no height or weight on file to calculate BMI.  General Appearance: Disheveled  Eye Contact:  Fair  Speech:  Normal Rate  Volume:  Normal  Mood:  Anxious, Depressed and Dysphoric  Affect:  Congruent  Thought Process:  Coherent and Descriptions of Associations: Intact  Orientation:  Full (Time, Place, and Person)  Thought Content:  Logical  Suicidal Thoughts:  No  Homicidal Thoughts:  No  Memory:  Immediate;   Fair Recent;   Fair Remote;   Fair  Judgement:  Impaired  Insight:  Fair  Psychomotor Activity:  Increased  Concentration:  Concentration: Fair and Attention Span: Fair  Recall:  [employer] of Knowledge:  Fair  Language:  Good  Akathisia:  Negative  Handed:  Right  AIMS (if indicated):     Assets:  Desire for Improvement Resilience  ADL's:  Intact  Cognition:  WNL  Sleep:       Treatment Plan Summary: Daily contact with patient to assess and evaluate symptoms and progress in treatment, Medication management and Plan :Patient is seen and examined.  Patient is a 58 year old male with a longstanding past psychiatric history significant for alcohol dependence, generalized anxiety disorder and major depression.  He was transferred from  the [facility] for continued treatment for his alcoholism, depression and anxiety.  He will be admitted to the hospital.  He will be integrated into the milieu.  He will be encouraged to attend groups.  He will be continued on his lisinopril hydrochlorothiazide.  He will be given lorazepam 1 mg p.o. every 6 hours as needed a CIWA greater than 10.  He will also be started on Zoloft 25 mg p.o. daily for anxiety and depression.  He will also continue his vitamin D3 and D2.  He will also continue trazodone 100 mg p.o. nightly for sleep.  Review of his laboratories revealed a mildly increased INR at 1.0 and a mildly increased PTT at 23.6.  His sodium was mildly low at 133.  Creatinine is normal at 1.0.  His AST and ALT were in the normal range.  His urinalysis showed 1+ ketones, 1+ blood, 2-5 red blood cells, 10-20 white blood cells.  We will repeat that urinalysis.  Chest x-ray was essentially negative.  Observation Level/Precautions:  Detox 15 minute checks Seizure  Laboratory:  Chemistry Profile  Psychotherapy:    Medications:    Consultations:    Discharge Concerns:    Estimated LOS:  Other:     Physician Treatment Plan for Primary Diagnosis: Major depressive disorder, recurrent severe without psychotic features (HCC) Long Term Goal(s): Improvement in symptoms so as ready for discharge  Short Term Goals: Ability to identify changes in lifestyle to reduce recurrence of condition will improve, Ability to verbalize feelings will improve, Ability to disclose and discuss suicidal ideas, Ability to demonstrate self-control will improve, Ability to identify and develop effective coping behaviors will improve, Ability to maintain clinical measurements within normal limits will improve and Ability to identify triggers associated with substance abuse/mental health issues will improve  Physician Treatment Plan for Secondary Diagnosis: Principal Problem:   Major depressive disorder, recurrent severe  without psychotic features (HCC) Active Problems:   Alcohol dependence with withdrawal (HCC)  Long Term Goal(s): Improvement in symptoms so as ready for discharge  Short Term Goals: Ability to identify changes in lifestyle to reduce recurrence of condition will improve, Ability to verbalize feelings will improve, Ability to disclose and discuss suicidal ideas, Ability to demonstrate self-control will improve, Ability to identify and develop effective coping behaviors will improve, Ability to maintain clinical measurements within normal limits will improve and Ability to identify triggers associated with substance abuse/mental health issues will improve  I certify that inpatient services furnished can reasonably be expected to improve the patient's condition.    Antonieta Pert, MD 4/26/20202:30 PM

## 2018-07-08 NOTE — [facility] Suicide Risk Assessment (Signed)
[facility] Admission Suicide Risk Assessment   Nursing information obtained from:    Demographic factors:    Current Mental Status:    Loss Factors:    Historical Factors:    Risk Reduction Factors:     Total Time spent with patient: 30 minutes Principal Problem: Major depressive disorder, recurrent severe without psychotic features (HCC) Diagnosis:  Principal Problem:   Major depressive disorder, recurrent severe without psychotic features (HCC) Active Problems:   Alcohol dependence with withdrawal (HCC)  Subjective Data: Patient is seen and examined.  Patient is a 58 year old male with a longstanding past psychiatric history significant for alcohol dependence, generalized anxiety and depression who presented to the [facility] emergency department on 07/05/2018 with suicidal ideation.  The patient stated that he had relapsed on alcohol, was feeling significantly anxious, and was feeling helpless and hopeless.  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  He has a longstanding history of alcohol dependence, and had recent elapsed.  He had recently seen a provider [place] for health issues, and he had been prescribed clonazepam 1 mg p.o. 3 times daily as needed.  He stated that he used those up early, and was combining those with alcohol.  He stated that recently he had been drinking 3 bottles of wine a day.  He had also had psychosocial stressors of having to move from a home that he lived in for many years to take over home that his family had a banded years ago.  He stated he took approximately 7 lisinopril/hydrochlorothiazide tablets and an overdose.  He was brought to the [facility] for evaluation and stabilization.  He was kept there for approximately 2 to 3 days before to being transferred to our facility.  He was admitted to the hospital for evaluation and stabilization.  Continued Clinical Symptoms:    The  "Alcohol Use Disorders Identification Test", Guidelines for Use in Primary Care, Second Edition.  World Science writer [facility]). Score between 0-7:  no or low risk or alcohol related problems. Score between 8-15:  moderate risk of alcohol related problems. Score between 16-19:  high risk of alcohol related problems. Score 20 or above:  warrants further diagnostic evaluation for alcohol dependence and treatment.   CLINICAL FACTORS:   Severe Anxiety and/or Agitation Depression:   Anhedonia Comorbid alcohol abuse/dependence Hopelessness Impulsivity Insomnia Alcohol/Substance Abuse/Dependencies   Musculoskeletal: Strength & Muscle Tone: within normal limits Gait & Station: normal Patient leans: N/A  Psychiatric Specialty Exam: Physical Exam  Nursing note and vitals reviewed. Constitutional: He is oriented to person, place, and time. He appears well-developed and well-nourished.  HENT:  Head: Normocephalic and atraumatic.  Respiratory: Effort normal.  Neurological: He is alert and oriented to person, place, and time.    ROS  There were no vitals taken for this visit.There is no height or weight on file to calculate BMI.  General Appearance: Disheveled  Eye Contact:  Fair  Speech:  Normal Rate  Volume:  Increased  Mood:  Anxious and Depressed  Affect:  Congruent  Thought Process:  Coherent and Descriptions of Associations: Intact  Orientation:  Full (Time, Place, and Person)  Thought Content:  Logical  Suicidal Thoughts:  No  Homicidal Thoughts:  No  Memory:  Immediate;   Fair Recent;   Fair Remote;   Fair  Judgement:  Impaired  Insight:  Fair  Psychomotor Activity:  Increased  Concentration:  Concentration: Fair  and Attention Span: Fair  Recall:  [employer]  Fund of Knowledge:  Fair  Language:  Fair  Akathisia:  Negative  Handed:  Right  AIMS (if indicated):     Assets:  Desire for Improvement Resilience  ADL's:  Intact  Cognition:  WNL  Sleep:          COGNITIVE FEATURES THAT CONTRIBUTE TO RISK:  None    SUICIDE RISK:   Mild:  Suicidal ideation of limited frequency, intensity, duration, and specificity.  There are no identifiable plans, no associated intent, mild dysphoria and related symptoms, good self-control (both objective and subjective assessment), few other risk factors, and identifiable protective factors, including available and accessible social support.  PLAN OF CARE: Patient is seen and examined.  Patient is a 58 year old male with a longstanding past psychiatric history significant for alcohol dependence, generalized anxiety disorder and major depression.  [facility] for continued treatment for his alcoholism, depression and anxiety.  He will be admitted to the hospital.  He will be integrated into the milieu.  He will be encouraged to attend groups.  He will be continued on his lisinopril hydrochlorothiazide.  He will be given lorazepam 1 mg p.o. every 6 hours as needed a CIWA greater than 10.  He will also be started on Zoloft 25 mg p.o. daily for anxiety and depression.  He will also continue his vitamin D3 and D2.  He will also continue trazodone 100 mg p.o. nightly for sleep.  Review of his laboratories revealed a mildly increased INR at 1.0 and a mildly increased PTT at 23.6.  His sodium was mildly low at 133.  Creatinine is normal at 1.0.  His AST and ALT were in the normal range.  His urinalysis showed 1+ ketones, 1+ blood, 2-5 red blood cells, 10-20 white blood cells.  We will repeat that urinalysis.  Chest x-ray was essentially negative.  I certify that inpatient services furnished can reasonably be expected to improve the patient's condition.   Antonieta PertGreg Lawson Glessie Eustice, MD 07/08/2018, 2:21 PM

## 2018-07-09 LAB — COMPREHENSIVE METABOLIC PANEL
ALT: 51 U/L — ABNORMAL HIGH (ref 0–44)
AST: 28 U/L (ref 15–41)
Albumin: 4.4 g/dL (ref 3.5–5.0)
Alkaline Phosphatase: 86 U/L (ref 38–126)
Anion gap: 9 (ref 5–15)
BUN: 17 mg/dL (ref 6–20)
CO2: 25 mmol/L (ref 22–32)
Calcium: 9.5 mg/dL (ref 8.9–10.3)
Chloride: 102 mmol/L (ref 98–111)
Creatinine, Ser: 0.82 mg/dL (ref 0.61–1.24)
GFR calc Af Amer: >60 mL/min (ref >60)
GFR calc non Af Amer: >60 mL/min (ref >60)
Glucose, Bld: 98 mg/dL (ref 70–99)
Potassium: 4.3 mmol/L (ref 3.5–5.1)
Sodium: 136 mmol/L (ref 135–145)
Total Bilirubin: 0.7 mg/dL (ref 0.3–1.2)
Total Protein: 7 g/dL (ref 6.5–8.1)

## 2018-07-09 LAB — TSH: TSH: 3.439 u[IU]/mL (ref 0.350–4.500)

## 2018-07-09 MED ORDER — GABAPENTIN 300 MG PO CAPS
300.0000 mg | ORAL_CAPSULE | Freq: Three times a day (TID) | ORAL | Status: DC
  Administered 2018-07-09 – 2018-07-10 (×4): 300 mg via ORAL
  Filled 2018-07-09: qty 1
  Filled 2018-07-09: qty 21
  Filled 2018-07-09: qty 1
  Filled 2018-07-09: qty 21
  Filled 2018-07-09 (×2): qty 1
  Filled 2018-07-09: qty 21
  Filled 2018-07-09 (×2): qty 1

## 2018-07-09 MED ORDER — SERTRALINE HCL 50 MG PO TABS
50.0000 mg | ORAL_TABLET | Freq: Every day | ORAL | Status: DC
  Administered 2018-07-10: 50 mg via ORAL
  Filled 2018-07-09 (×3): qty 1

## 2018-07-09 NOTE — Progress Notes (Signed)
Patient ID: Zachary [name], male   DOB: 1961-03-02, 58 y.o.   MRN: [id number] [place] NOVEL CORONAVIRUS (COVID-19) DAILY CHECK-OFF SYMPTOMS - answer yes or no to each - every day NO YES  Have you had a fever in the past 24 hours?  . Fever (Temp > 37.80C / 100F) X   Have you had any of these symptoms in the past 24 hours? . New Cough .  Sore Throat  .  Shortness of Breath .  Difficulty Breathing .  Unexplained Body Aches   X   Have you had any one of these symptoms in the past 24 hours not related to allergies?   . Runny Nose .  Nasal Congestion .  Sneezing   X   If you have had runny nose, nasal congestion, sneezing in the past 24 hours, has it worsened?  X   EXPOSURES - check yes or no X   Have you traveled outside the state in the past 14 days?  X   Have you been in contact with someone with a confirmed diagnosis of COVID-19 or PUI in the past 14 days without wearing appropriate PPE?  X   Have you been living in the same home as a person with confirmed diagnosis of COVID-19 or a PUI (household contact)?    X   Have you been diagnosed with COVID-19?    X              What to do next: Answered NO to all: Answered YES to anything:   Proceed with unit schedule Follow the [facility] Inpatient Flowsheet.

## 2018-07-09 NOTE — Progress Notes (Signed)
Adult Psychoeducational Group Note  Date:  07/09/2018 Time:  6:22 PM  Group Topic/Focus:  Goals Group:   The focus of this group is to help patients establish daily goals to achieve during treatment and discuss how the patient can incorporate goal setting into their daily lives to aide in recovery.  Participation Level:  Active  Participation Quality:  Appropriate  Affect:  Appropriate  Cognitive:  Alert  Insight: Appropriate  Engagement in Group:  Engaged  Modes of Intervention:  Discussion  Additional Comments:  Pt attended group and participated in group discussion.  Edessa Jakubowicz R Pranit Owensby 07/09/2018, 6:22 PM

## 2018-07-09 NOTE — Progress Notes (Signed)
[facility] MD Progress Note  07/09/2018 12:51 PM Zachary [name]  MRN:  [id number] Subjective:    Patient states that he really does not need further detox measures he only binged for 3 days he is also focused on clonazepam this is not going to be a long-term prescription he is informed we do not provide that for substance abusers but he will take Neurontin for anxiety he misses his dog wants to going denies wanting to harm self and fears going home and relapsing so we can wait 1 more day continue cognitive therapy add gabapentin for cravings and anxiety continue to monitor and current precautions Principal Problem: Major depressive disorder, recurrent severe without psychotic features (HCC) Diagnosis: Principal Problem:   Major depressive disorder, recurrent severe without psychotic features (HCC) Active Problems:   Alcohol dependence with withdrawal (HCC)  Total Time spent with patient: 20 minutes   Past Medical History:  Past Medical History:  Diagnosis Date  . Alcohol abuse   . COPD (chronic obstructive pulmonary disease) (HCC)   . Hypertension     Past Surgical History:  Procedure Laterality Date  . COLONOSCOPY    . MOUTH SURGERY    . NO PAST SURGERIES     Family History:  Family History  Problem Relation Age of Onset  . Heart attack Paternal Grandfather   . Stroke Mother   . Heart failure Mother   . Heart failure Father     Social History:  Social History   Substance and Sexual Activity  Alcohol Use Yes   Comment: BAC was .23 on admission     Social History   Substance and Sexual Activity  Drug Use Yes  . Types: Marijuana   Comment: UDS was negative    Social History   Socioeconomic History  . Marital status: Divorced    Spouse name: Not on file  . Number of children: 2  . Years of education: Not on file  . Highest education level: Not on file  Occupational History  . Occupation: Banker  . Financial resource strain: Not on file   . Food insecurity:    Worry: Not on file    Inability: Not on file  . Transportation needs:    Medical: Not on file    Non-medical: Not on file  Tobacco Use  . Smoking status: Current Some Day Smoker    Packs/day: 2.00    Types: Cigarettes  . Smokeless tobacco: Never Used  Substance and Sexual Activity  . Alcohol use: Yes    Comment: BAC was .23 on admission  . Drug use: Yes    Types: Marijuana    Comment: UDS was negative  . Sexual activity: Not Currently  Lifestyle  . Physical activity:    Days per week: Not on file    Minutes per session: Not on file  . Stress: Not on file  Relationships  . Social connections:    Talks on phone: Not on file    Gets together: Not on file    Attends religious service: Not on file    Active member of club or organization: Not on file    Attends meetings of clubs or organizations: Not on file    Relationship status: Not on file  Other Topics Concern  . Not on file  Social History Narrative   Pt stated that he lives alone; works as a Water engineer Social History:    Pain Medications: See [facility] Prescriptions:  See MAR Over the Counter: See MAR History of alcohol / drug use?: Yes Name of Substance 1: Alcohol 1 - Last Use / Amount: 07/04/2018 Name of Substance 2: Marijuana                Sleep: Good  Appetite:  Good  Current Medications: Current Facility-Administered Medications  Medication Dose Route Frequency Provider Last Rate Last Dose  . acetaminophen (TYLENOL) tablet 650 mg  650 mg Oral Q6H PRN Antonieta Pertlary, Greg Lawson, MD      . albuterol (VENTOLIN HFA) 108 (90 Base) MCG/ACT inhaler 1-2 puff  1-2 puff Inhalation Q6H PRN Antonieta Pertlary, Greg Lawson, MD      . alum & mag hydroxide-simeth (MAALOX/MYLANTA) 200-200-20 MG/5ML suspension 30 mL  30 mL Oral Q4H PRN Antonieta Pertlary, Greg Lawson, MD      . cholecalciferol (VITAMIN D3) tablet 1,000 Units  1,000 Units Oral Daily Antonieta Pertlary, Greg Lawson, MD   1,000 Units at 07/09/18 0857  .  folic acid (FOLVITE) tablet 1 mg  1 mg Oral Daily Antonieta Pertlary, Greg Lawson, MD   1 mg at 07/09/18 0858  . gabapentin (NEURONTIN) capsule 300 mg  300 mg Oral TID Malvin JohnsFarah, Yanis Juma, MD      . hydrochlorothiazide (HYDRODIURIL) tablet 25 mg  25 mg Oral Daily Antonieta Pertlary, Greg Lawson, MD   25 mg at 07/09/18 0858  . hydrOXYzine (ATARAX/VISTARIL) tablet 25 mg  25 mg Oral Q6H PRN Antonieta Pertlary, Greg Lawson, MD      . lisinopril (ZESTRIL) tablet 10 mg  10 mg Oral Daily Antonieta Pertlary, Greg Lawson, MD   10 mg at 07/09/18 0857  . LORazepam (ATIVAN) tablet 1 mg  1 mg Oral Q6H PRN Antonieta Pertlary, Greg Lawson, MD   1 mg at 07/09/18 [phone]  . magnesium hydroxide (MILK OF MAGNESIA) suspension 30 mL  30 mL Oral Daily PRN Antonieta Pertlary, Greg Lawson, MD      . nicotine polacrilex (NICORETTE) gum 2 mg  2 mg Oral PRN Antonieta Pertlary, Greg Lawson, MD      . Melene Muller[START ON 07/10/2018] sertraline (ZOLOFT) tablet 50 mg  50 mg Oral Daily Malvin JohnsFarah, Jhan Conery, MD      . thiamine (VITAMIN B-1) tablet 100 mg  100 mg Oral Daily Antonieta Pertlary, Greg Lawson, MD   100 mg at 07/09/18 0857  . traZODone (DESYREL) tablet 100 mg  100 mg Oral QHS PRN Antonieta Pertlary, Greg Lawson, MD   100 mg at 07/08/18 2131    Lab Results:  Results for orders placed or performed during the hospital encounter of 07/08/18 (from the past 48 hour(s))  Comprehensive metabolic panel     Status: Abnormal   Collection Time: 07/09/18  6:26 AM  Result Value Ref Range   Sodium 136 135 - 145 mmol/L   Potassium 4.3 3.5 - 5.1 mmol/L   Chloride 102 98 - 111 mmol/L   CO2 25 22 - 32 mmol/L   Glucose, Bld 98 70 - 99 mg/dL   BUN 17 6 - 20 mg/dL   Creatinine, Ser 7.840.82 0.61 - 1.24 mg/dL   Calcium 9.5 8.9 - 69.610.3 mg/dL   Total Protein 7.0 6.5 - 8.1 g/dL   Albumin 4.4 3.5 - 5.0 g/dL   AST 28 15 - 41 U/L   ALT 51 (H) 0 - 44 U/L   Alkaline Phosphatase 86 38 - 126 U/L   Total Bilirubin 0.7 0.3 - 1.2 mg/dL   GFR calc non Af Amer >60 >60 mL/min   GFR calc Af Amer >60 >60 mL/min  Anion gap 9 5 - 15    Comment: Performed at [facility],  [address], [place], [place] [address]  TSH     Status: None   Collection Time: 07/09/18  6:26 AM  Result Value Ref Range   TSH 3.439 0.350 - 4.500 uIU/mL    Comment: Performed by a 3rd Generation assay with a functional sensitivity of <=0.01 uIU/mL. Performed at [facility], [address], [place], [place] [address]     Blood Alcohol level:  Lab Results  Component Value Date   ETH 224 (H) 12/29/2017   ETH 130 (H) 09/17/2017    Metabolic Disorder Labs: Lab Results  Component Value Date   HGBA1C 5.3 10/28/2015   MPG 105 10/28/2015   MPG 111 04/18/2007   No results found for: PROLACTIN Lab Results  Component Value Date   CHOL 309 (H) 10/29/2015   TRIG 153 (H) 10/29/2015   HDL 94 10/29/2015   CHOLHDL 3.3 10/29/2015   VLDL 31 10/29/2015   LDLCALC 184 (H) 10/29/2015   LDLCALC (H) 04/19/2007    142        Total Cholesterol/HDL:CHD Risk Coronary Heart Disease Risk Table                     Men   Women  1/2 Average Risk   3.4   3.3    Physical Findings: AIMS:  , ,  ,  ,    CIWA:  CIWA-Ar Total: 12 COWS:     Musculoskeletal: Strength & Muscle Tone: within normal limits Gait & Station: normal Patient leans: N/A  Psychiatric Specialty Exam: Physical Exam  ROS  Blood pressure 122/82, pulse 93, temperature 98 F (36.7 C), temperature source Oral, resp. rate 17, height 5' 7.72" (1.72 m), weight 80.3 kg.Body mass index is 27.14 kg/m.  General Appearance: Casual  Eye Contact:  Fair  Speech:  Clear and Coherent  Volume:  Normal  Mood:  Dysphoric  Affect:  Appropriate and Congruent  Thought Process:  Coherent and Goal Directed  Orientation:  Full (Time, Place, and Person)  Thought Content:  Logical and Tangential  Suicidal Thoughts:  No  Homicidal Thoughts:  No  Memory:  Immediate;   Fair  Judgement:  Fair  Insight:  Fair  Psychomotor Activity:  Normal  Concentration:  Concentration: Fair  Recall:  [employer] of Knowledge:  Fair   Language:  Good  Akathisia:  Negative  Handed:  Right  AIMS (if indicated):     Assets:  Communication Skills Desire for Improvement Leisure Time Physical Health  ADL's:  Intact  Cognition:  WNL  Sleep:  Number of Hours: 5.25     Treatment Plan Summary: Daily contact with patient to assess and evaluate symptoms and progress in treatment, Medication management and Plan Continue current precautions and measures add gabapentin escalate Zoloft probable discharge tomorrow  Malvin Johns, MD 07/09/2018, 12:51 PM

## 2018-07-09 NOTE — Progress Notes (Signed)
Patient ID: Zachary [name], male   DOB: Feb 17, 1961, 58 y.o.   MRN: [id number] D) Pt has been anxious and depressed this shift. Primarily focused on anxiety and "withdrawal". Pt becomes irritable with peers in milieu.  Pt rates his anxiety a 6/10 and depression a 4/10.  positive for groups and activities with prompting. Pt c/o feeling "shaky and tired" frequently asking for "anxiety. Medication". CIWA not high enough to warrant administration of Ativan. Pt is working on calming and relaxing coping skills as his goal today. Wants to get back home to his dog and job. A) Level 3 obs for safety, support and encouragement provided. Encourage use of coping skills. Med ed reinforced. R) Cooperative.

## 2018-07-09 NOTE — Progress Notes (Signed)
Recreation Therapy Notes  Date:  4.27.20 Time: 0930 Location: [address] Dayroom  Group Topic: Stress Management  Goal Area(s) Addresses:  Patient will identify positive stress management techniques. Patient will identify benefits of using stress management post d/c.  Behavioral Response:  Engaged     Intervention: Stress Management  Activity :  Meditation.  LRT introduced the stress management technique of meditation.  LRT played a meditation that dealt with being resilient in the face of adversity.  Patients were to listen and follow along as meditation played.  Education:  Stress Management, Discharge Planning.   Education Outcome: Acknowledges Education  Clinical Observations/Feedback:  Pt attended and participated in group.     Caroll Rancher, LRT/CTRS         Caroll Rancher A 07/09/2018 10:58 AM

## 2018-07-09 NOTE — Progress Notes (Signed)
Pt attended spiritual care group on grief and loss facilitated by chaplain Burnis Kingfisher   Group opened with brief discussion and psycho-social ed around grief and loss in relationships and in relation to self - identifying life patterns, circumstances, changes that cause losses. Established group norm of speaking from own life experience. Group goal of establishing open and affirming space for members to share loss and experience with grief, normalize grief experience and provide psycho social education and grief support.      Norm was present throughout group.  Engaged in group discussion voluntarily.   Stated he has been living in his father's abandoned house.  Norm's mother experienced stroke several years ago.  Norm's father cared for mother in this house.  He had not taken care of house during this time and abandoned it after Norm's mother's death.  Norm has been sorting through [employer] possessions - relating that he was having to burn furniture and other memories from childhood because they were ruined by neglect.  Related not having support for his grief during this period.  Speaks with his son, who has a different connection to these items.   Norm spoke with group about values and hopes he is holding on to in the midst of doing this hard work.  He is hopeful that his grandchildren will be able to enjoy the land and auto shop at the space.  Spoke about "wanting to return to an old normal" but recognizes things are different now - especially related to substance use.  Thought with group about ways to cope when he feels overwhelmed by tasks of cleaning out his parent's house.     Burnis Kingfisher, MDiv, [facility]

## 2018-07-09 NOTE — Tx Team (Signed)
Interdisciplinary Treatment and Diagnostic Plan Update  07/09/2018 Time of Session: 0849 Zachary [name] MRN: [id number]  Principal Diagnosis: Major depressive disorder, recurrent severe without psychotic features [facility](HCC)  Secondary Diagnoses: Principal Problem:   Major depressive disorder, recurrent severe without psychotic features (HCC) Active Problems:   Alcohol dependence with withdrawal (HCC)   Current Medications:  Current Facility-Administered Medications  Medication Dose Route Frequency Provider Last Rate Last Dose  . acetaminophen (TYLENOL) tablet 650 mg  650 mg Oral Q6H PRN Antonieta Pertlary, Greg Lawson, MD      . albuterol (VENTOLIN HFA) 108 (90 Base) MCG/ACT inhaler 1-2 puff  1-2 puff Inhalation Q6H PRN Antonieta Pertlary, Greg Lawson, MD      . alum & mag hydroxide-simeth (MAALOX/MYLANTA) 200-200-20 MG/5ML suspension 30 mL  30 mL Oral Q4H PRN Antonieta Pertlary, Greg Lawson, MD      . cholecalciferol (VITAMIN D3) tablet 1,000 Units  1,000 Units Oral Daily Antonieta Pertlary, Greg Lawson, MD   1,000 Units at 07/09/18 0857  . folic acid (FOLVITE) tablet 1 mg  1 mg Oral Daily Antonieta Pertlary, Greg Lawson, MD   1 mg at 07/09/18 0858  . hydrochlorothiazide (HYDRODIURIL) tablet 25 mg  25 mg Oral Daily Antonieta Pertlary, Greg Lawson, MD   25 mg at 07/09/18 0858  . hydrOXYzine (ATARAX/VISTARIL) tablet 25 mg  25 mg Oral Q6H PRN Antonieta Pertlary, Greg Lawson, MD      . lisinopril (ZESTRIL) tablet 10 mg  10 mg Oral Daily Antonieta Pertlary, Greg Lawson, MD   10 mg at 07/09/18 0857  . LORazepam (ATIVAN) tablet 1 mg  1 mg Oral Q6H PRN Antonieta Pertlary, Greg Lawson, MD   1 mg at 07/09/18 [phone]  . magnesium hydroxide (MILK OF MAGNESIA) suspension 30 mL  30 mL Oral Daily PRN Antonieta Pertlary, Greg Lawson, MD      . nicotine polacrilex (NICORETTE) gum 2 mg  2 mg Oral PRN Antonieta Pertlary, Greg Lawson, MD      . sertraline (ZOLOFT) tablet 25 mg  25 mg Oral Daily Antonieta Pertlary, Greg Lawson, MD   25 mg at 07/09/18 0858  . thiamine (VITAMIN B-1) tablet 100 mg  100 mg Oral Daily Antonieta Pertlary, Greg Lawson, MD   100 mg at 07/09/18 0857  .  traZODone (DESYREL) tablet 100 mg  100 mg Oral QHS PRN Antonieta Pertlary, Greg Lawson, MD   100 mg at 07/08/18 2131   PTA Medications: Medications Prior to Admission  Medication Sig Dispense Refill Last Dose  . cholecalciferol (VITAMIN D3) 25 MCG (1000 UT) tablet Take 1,000 Units by mouth daily.   Past Week at Unknown time  . clonazePAM (KLONOPIN) 0.5 MG tablet Take 1 tablet by mouth 2 (two) times a day.   Past Week at Unknown time  . lisinopril-hydrochlorothiazide (ZESTORETIC) 10-12.5 MG tablet Take 1 tablet by mouth daily.   Past Week at Unknown time  . traZODone (DESYREL) 100 MG tablet Take 1 tablet by mouth daily.   Past Week at Unknown time  . Vitamin D, Ergocalciferol, (DRISDOL) 1.25 MG (50000 UT) CAPS capsule Take 1 capsule by mouth every 7 (seven) days. Monday   Past Week at Unknown time    Patient Stressors:    Patient Strengths:    Treatment Modalities: Medication Management, Group therapy, Case management,  1 to 1 session with clinician, Psychoeducation, Recreational therapy.   Physician Treatment Plan for Primary Diagnosis: Major depressive disorder, recurrent severe without psychotic features (HCC) Long Term Goal(s): Improvement in symptoms so as ready for discharge Improvement in symptoms so as ready for discharge  Short Term Goals: Ability to identify changes in lifestyle to reduce recurrence of condition will improve Ability to verbalize feelings will improve Ability to disclose and discuss suicidal ideas Ability to demonstrate self-control will improve Ability to identify and develop effective coping behaviors will improve Ability to maintain clinical measurements within normal limits will improve Ability to identify triggers associated with substance abuse/mental health issues will improve Ability to identify changes in lifestyle to reduce recurrence of condition will improve Ability to verbalize feelings will improve Ability to disclose and discuss suicidal ideas Ability to  demonstrate self-control will improve Ability to identify and develop effective coping behaviors will improve Ability to maintain clinical measurements within normal limits will improve Ability to identify triggers associated with substance abuse/mental health issues will improve  Medication Management: Evaluate patient's response, side effects, and tolerance of medication regimen.  Therapeutic Interventions: 1 to 1 sessions, Unit Group sessions and Medication administration.  Evaluation of Outcomes: Progressing  Physician Treatment Plan for Secondary Diagnosis: Principal Problem:   Major depressive disorder, recurrent severe without psychotic features (HCC) Active Problems:   Alcohol dependence with withdrawal (HCC)  Long Term Goal(s): Improvement in symptoms so as ready for discharge Improvement in symptoms so as ready for discharge   Short Term Goals: Ability to identify changes in lifestyle to reduce recurrence of condition will improve Ability to verbalize feelings will improve Ability to disclose and discuss suicidal ideas Ability to demonstrate self-control will improve Ability to identify and develop effective coping behaviors will improve Ability to maintain clinical measurements within normal limits will improve Ability to identify triggers associated with substance abuse/mental health issues will improve Ability to identify changes in lifestyle to reduce recurrence of condition will improve Ability to verbalize feelings will improve Ability to disclose and discuss suicidal ideas Ability to demonstrate self-control will improve Ability to identify and develop effective coping behaviors will improve Ability to maintain clinical measurements within normal limits will improve Ability to identify triggers associated with substance abuse/mental health issues will improve     Medication Management: Evaluate patient's response, side effects, and tolerance of medication  regimen.  Therapeutic Interventions: 1 to 1 sessions, Unit Group sessions and Medication administration.  Evaluation of Outcomes: Progressing   RN Treatment Plan for Primary Diagnosis: Major depressive disorder, recurrent severe without psychotic features (HCC) Long Term Goal(s): Knowledge of disease and therapeutic regimen to maintain health will improve  Short Term Goals: Ability to participate in decision making will improve, Ability to verbalize feelings will improve, Ability to disclose and discuss suicidal ideas, Ability to identify and develop effective coping behaviors will improve and Compliance with prescribed medications will improve  Medication Management: RN will administer medications as ordered by provider, will assess and evaluate patient's response and provide education to patient for prescribed medication. RN will report any adverse and/or side effects to prescribing provider.  Therapeutic Interventions: 1 on 1 counseling sessions, Psychoeducation, Medication administration, Evaluate responses to treatment, Monitor vital signs and CBGs as ordered, Perform/monitor CIWA, COWS, AIMS and Fall Risk screenings as ordered, Perform wound care treatments as ordered.  Evaluation of Outcomes: Progressing   LCSW Treatment Plan for Primary Diagnosis: Major depressive disorder, recurrent severe without psychotic features (HCC) Long Term Goal(s): Safe transition to appropriate next level of care at discharge, Engage patient in therapeutic group addressing interpersonal concerns.  Short Term Goals: Engage patient in aftercare planning with referrals and resources and Increase skills for wellness and recovery  Therapeutic Interventions: Assess for all discharge needs, 1 to  1 time with Child psychotherapist, Explore available resources and support systems, Assess for adequacy in community support network, Educate family and significant other(s) on suicide prevention, Complete Psychosocial Assessment,  Interpersonal group therapy.  Evaluation of Outcomes: Progressing   Progress in Treatment: Attending groups: Yes. Participating in groups: Yes. Taking medication as prescribed: Yes. Toleration medication: Yes. Family/Significant other contact made: No, will contact:  consent for son, continuating to try to contact son Patient understands diagnosis: Yes. Discussing patient identified problems/goals with staff: Yes. Medical problems stabilized or resolved: Yes. Denies suicidal/homicidal ideation: Yes. Issues/concerns per patient self-inventory: No. Other:   New problem(s) identified: No, Describe:  None  New Short Term/Long Term Goal(s):  Patient Goals:  "To get a grip on my anxiety"  Discharge Plan or Barriers:   Reason for Continuation of Hospitalization: Anxiety Medication stabilization  Estimated Length of Stay: 3-5 days  Attendees: Patient: 07/09/2018   Physician: Dr. Malvin Johns, MD 07/09/2018   Nursing: Dewayne Shorter, RN 07/09/2018   RN Care Manager: 07/09/2018   Social Worker: Stephannie Peters, LCSW 07/09/2018   Recreational Therapist:  07/09/2018   Other:  07/09/2018   Other:  07/09/2018   Other: 07/09/2018      Scribe for Treatment Team: Delphia Grates, LCSW 07/09/2018 11:12 AM

## 2018-07-09 NOTE — [facility] Suicide Risk Assessment (Signed)
[facility] INPATIENT:  Family/Significant Other Suicide Prevention Education  Suicide Prevention Education:  Education Completed; Pt's son, Zachary [name] [phone], has been identified by the patient as the family member/significant other with whom the patient will be residing, and identified as the person(s) who will aid the patient in the event of a mental health crisis (suicidal ideations/suicide attempt).  With written consent from the patient, the family member/significant other has been provided the following suicide prevention education, prior to the and/or following the discharge of the patient.  The suicide prevention education provided includes the following:  Suicide risk factors  Suicide prevention and interventions  National Suicide Hotline telephone number  [facility] assessment telephone number  [facility] Emergency Assistance 911  [facility] and/or Residential Mobile Crisis Unit telephone number  Request made of family/significant other to:  Remove weapons (e.g., guns, rifles, knives), all items previously/currently identified as safety concern.    Remove drugs/medications (over-the-counter, prescriptions, illicit drugs), all items previously/currently identified as a safety concern.  The family member/significant other verbalizes understanding of the suicide prevention education information provided.  The family member/significant other agrees to remove the items of safety concern listed above.  CSW contacted pt's son, Zachary [name]. Pt's son stated that the pt is supposed to be discharging tomorrow and that he plans to come pick him up. Pt's son stated that he was just at the [facility] to obtain the Pt's phone so that he can call some of the Pt's clients for work. Pt's son stated that the Pt should be going to [facility] in [place] for aftercare. Pt does not live with his son but pt's son verified that the pt does not have any weapons in  the home. Pt's son stated that the pt should not have any extra medications in his house because he ran out a few weeks ago which is why he is in the hospital now.   Delphia Grates 07/09/2018, 3:38 PM

## 2018-07-10 MED ORDER — SERTRALINE HCL 100 MG PO TABS
100.0000 mg | ORAL_TABLET | Freq: Every day | ORAL | Status: DC
  Administered 2018-07-10: 08:00:00 100 mg via ORAL
  Filled 2018-07-10: qty 1
  Filled 2018-07-10 (×3): qty 7

## 2018-07-10 MED ORDER — GABAPENTIN 300 MG PO CAPS: 300.0000 mg | ORAL_CAPSULE | Freq: Three times a day (TID) | ORAL | 2 refills | Status: DC

## 2018-07-10 MED ORDER — ALBUTEROL SULFATE HFA 108 (90 BASE) MCG/ACT IN AERS: 1.0000 | INHALATION_SPRAY | Freq: Four times a day (QID) | RESPIRATORY_TRACT | 2 refills | Status: DC | PRN

## 2018-07-10 MED ORDER — TRAZODONE HCL 100 MG PO TABS: 100.0000 mg | ORAL_TABLET | Freq: Every evening | ORAL | 1 refills | Status: DC | PRN

## 2018-07-10 MED ORDER — SERTRALINE HCL 100 MG PO TABS: 100.0000 mg | ORAL_TABLET | Freq: Every day | ORAL | 1 refills | Status: DC

## 2018-07-10 NOTE — Discharge Summary (Signed)
Physician Discharge Summary Note  Patient:  Zachary [name] is an 58 y.o., male MRN:  [id number] DOB:  1960-04-15 Patient phone:  [phone] (home)  Patient address:   [address] [place] [address],  Total Time spent with patient: 15 minutes  Date of Admission:  07/08/2018 Date of Discharge: 07/10/18  Reason for Admission:  Suicidal ideation  Principal Problem: Major depressive disorder, recurrent severe without psychotic features [facility]) Discharge Diagnoses: Principal Problem:   Major depressive disorder, recurrent severe without psychotic features (HCC) Active Problems:   Alcohol dependence with withdrawal [facility])   Past Psychiatric History: Per admission H&P: Patient has had multiple psychiatric hospitalizations in the past.  Review of the electronic medical record revealed his last hospitalization at our facility was on 12/30/2016.  His last psychiatric hospitalization in general was at [facility] in May 2019.  He has been treated with multiple medications in the past.  He has received Paxil in the past.  Past Medical History:  Past Medical History:  Diagnosis Date  . Alcohol abuse   . COPD (chronic obstructive pulmonary disease) (HCC)   . Hypertension     Past Surgical History:  Procedure Laterality Date  . COLONOSCOPY    . MOUTH SURGERY    . NO PAST SURGERIES     Family History:  Family History  Problem Relation Age of Onset  . Heart attack Paternal Grandfather   . Stroke Mother   . Heart failure Mother   . Heart failure Father    Family Psychiatric  History: Per admission H&P: Patient stated that there is alcoholism on his mother side of the family, and depression as well. Social History:  Social History   Substance and Sexual Activity  Alcohol Use Yes   Comment: BAC was .23 on admission     Social History   Substance and Sexual Activity  Drug Use Yes  . Types: Marijuana   Comment: UDS was negative    Social History   Socioeconomic  History  . Marital status: Divorced    Spouse name: Not on file  . Number of children: 2  . Years of education: Not on file  . Highest education level: Not on file  Occupational History  . Occupation: Banker  . Financial resource strain: Not on file  . Food insecurity:    Worry: Not on file    Inability: Not on file  . Transportation needs:    Medical: Not on file    Non-medical: Not on file  Tobacco Use  . Smoking status: Current Some Day Smoker    Packs/day: 2.00    Types: Cigarettes  . Smokeless tobacco: Never Used  Substance and Sexual Activity  . Alcohol use: Yes    Comment: BAC was .23 on admission  . Drug use: Yes    Types: Marijuana    Comment: UDS was negative  . Sexual activity: Not Currently  Lifestyle  . Physical activity:    Days per week: Not on file    Minutes per session: Not on file  . Stress: Not on file  Relationships  . Social connections:    Talks on phone: Not on file    Gets together: Not on file    Attends religious service: Not on file    Active member of club or organization: Not on file    Attends meetings of clubs or organizations: Not on file    Relationship status: Not on file  Other  Topics Concern  . Not on file  Social History Narrative   Pt stated that he lives alone; works as a Futures trader Course:  From admission H&P:Patient is a 58 year old male with a longstanding past psychiatric history significant for alcohol dependence, generalized anxiety and depression who presented to the [facility] emergency department on 07/05/2018 with suicidal ideation. The patient stated that he had relapsed on alcohol, was feeling significantly anxious, and was feeling helpless and hopeless. 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 He has a longstanding history of alcohol dependence, and had recent elapsed. He had recently  seen a provider [place] for health issues, and he had been prescribed clonazepam 1 mg p.o. 3 times daily as needed. He stated that he used those up early, and was combining those with alcohol. He stated that recently he had been drinking 3 bottles of wine a day. He had also had psychosocial stressors of having to move from a home that he lived in for many years to take over home that his family had a banded years ago. He stated he took approximately 7 lisinopril/hydrochlorothiazide tablets and an overdose. He was brought to the [facility] for evaluation and stabilization. He was kept there for approximately 2 to 3 days before to being transferred to our facility. He was admitted to the hospital for evaluation and stabilization.  Mr. Rammel was admitted for suicidal ideation after a recent relapse on alcohol. He also reported abuse of prescribed Klonopin with the alcohol. CIWA protocol was started with Ativan PRN CIWA>10. He was started on Zoloft and Neurontin. Trazodone was continued. He participated in group therapy on the unit. He responded well to treatment with no adverse effects reported. He remained on the [facility] unit for 2 days. He stabilized with medication and therapy. He was discharged on the medications listed below. He has shown improvement with improved mood, affect, sleep, appetite, and interaction. He denies any SI/HI/AVH and contracts for safety. 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 He denies cravings or withdrawal symptoms. He agrees to follow up at [facility] (see below). He is provided with prescriptions and medication samples upon discharge. His son is picking him up for discharge home.  Physical Findings: AIMS:  , ,  ,  ,    CIWA:  CIWA-Ar Total: 12 COWS:     Musculoskeletal: Strength & Muscle Tone: within normal limits Gait & Station:  normal Patient leans: N/A  Psychiatric Specialty Exam: Physical Exam  Nursing note and vitals reviewed. Constitutional: He is oriented to person, place, and time. He appears well-developed and well-nourished.  Cardiovascular: Normal rate.  Respiratory: Effort normal.  Neurological: He is alert and oriented to person, place, and time.    Review of Systems  Constitutional: Negative.   Psychiatric/Behavioral: Positive for depression (improving) and substance abuse (ETOH). Negative for hallucinations, memory loss and suicidal ideas. The patient is not nervous/anxious and does not have insomnia.     Blood pressure 112/78, pulse 92, temperature 98.1 F (36.7 C), temperature source Oral, resp. rate 20, height 5' 7.72" (1.72 m), weight 80.3 kg, SpO2 98 %.Body mass index is 27.14 kg/m.  General Appearance: Casual  Eye Contact:  Good  Speech:  Clear and Coherent and Normal Rate  Volume:  Normal  Mood:  Euthymic  Affect:  Appropriate and Congruent  Thought Process:  Coherent and Goal Directed  Orientation:  Full (Time, Place, and Person)  Thought Content:  WDL  Suicidal Thoughts:  No  Homicidal Thoughts:  No  Memory:  Immediate;   Fair Recent;   Fair  Judgement:  Intact  Insight:  Fair  Psychomotor Activity:  Normal  Concentration:  Concentration: Good and Attention Span: Fair  Recall:  Fis[employer]Fair  Fund of Knowledge:  Fair  Language:  Good  Akathisia:  No  Handed:  Right  AIMS (if indicated):     Assets:  Communication Skills Desire for Improvement Housing Social Support  ADL's:  Intact  Cognition:  WNL  Sleep:  Number of Hours: 6.75        Has this patient used any form of tobacco in the last 30 days? (Cigarettes, Smokeless Tobacco, Cigars, and/or Pipes)  Yes, A prescription for an FDA-approved tobacco cessation medication was offered at discharge and the patient refused  Blood Alcohol level:  Lab Results  Component Value Date   ETH 224 (H) 12/29/2017   ETH 130 (H)  09/17/2017    Metabolic Disorder Labs:  Lab Results  Component Value Date   HGBA1C 5.3 10/28/2015   MPG 105 10/28/2015   MPG 111 04/18/2007   No results found for: PROLACTIN Lab Results  Component Value Date   CHOL 309 (H) 10/29/2015   TRIG 153 (H) 10/29/2015   HDL 94 10/29/2015   CHOLHDL 3.3 10/29/2015   VLDL 31 10/29/2015   LDLCALC 184 (H) 10/29/2015   LDLCALC (H) 04/19/2007    142        Total Cholesterol/HDL:CHD Risk Coronary Heart Disease Risk Table                     Men   Women  1/2 Average Risk   3.4   3.3    See Psychiatric Specialty Exam and Suicide Risk Assessment completed by Attending Physician prior to discharge.  Discharge destination:  Home  Is patient on multiple antipsychotic therapies at discharge:  No   Has Patient had three or more failed trials of antipsychotic monotherapy by history:  No  Recommended Plan for Multiple Antipsychotic Therapies: NA   Allergies as of 07/10/2018      Reactions   Penicillins Other (See Comments)   Childhood allergy Reaction:  Unknown  Has patient had a PCN reaction causing immediate rash, facial/tongue/throat swelling, SOB or lightheadedness with hypotension: Unknown Has patient had a PCN reaction causing severe rash involving mucus membranes or skin necrosis: Unknown Has patient had a PCN reaction that required hospitalization Unknown Has patient had a PCN reaction occurring within the last 10 years: No If all of the above answers are "NO", then may proceed with Cephalosporin use.   Statins Palpitations      Medication List    STOP taking these medications   clonazePAM 0.5 MG tablet Commonly known as:  KLONOPIN   Vitamin D (Ergocalciferol) 1.25 MG (50000 UT) Caps capsule Commonly known as:  DRISDOL     TAKE these medications     Indication  albuterol 108 (90 Base) MCG/ACT inhaler Commonly known as:  VENTOLIN HFA Inhale 1-2 puffs into the lungs every 6 (six) hours as needed for wheezing or shortness of  breath.  Indication:  Spasm of Lung Air Passages   cholecalciferol 25 MCG (1000 UT) tablet Commonly known as:  VITAMIN D3 Take 1,000 Units by mouth daily.  Indication:  21-Hydroxylase  Deficiency   gabapentin 300 MG capsule Commonly known as:  NEURONTIN Take 1 capsule (300 mg total) by mouth 3 (three) times daily.  Indication:  Abuse or Misuse of Alcohol   lisinopril-hydrochlorothiazide 10-12.5 MG tablet Commonly known as:  ZESTORETIC Take 1 tablet by mouth daily.  Indication:  High Blood Pressure Disorder   sertraline 100 MG tablet Commonly known as:  ZOLOFT Take 1 tablet (100 mg total) by mouth daily. Start taking on:  July 11, 2018  Indication:  Major Depressive Disorder   traZODone 100 MG tablet Commonly known as:  DESYREL Take 1 tablet (100 mg total) by mouth at bedtime as needed for sleep. What changed:    when to take this  reasons to take this  Indication:  Trouble Sleeping      Follow-up [facility] Follow up on 07/11/2018.   Why:  Hospital follow up appointment is Wednesday, 4/29 at 2:30p.  The appointment will be held over the phone. Office will call you after discharge to complete paperwork.  Contact information: [address] [place] [address] [phone]           Follow-up recommendations: Activity as tolerated. Diet as recommended by primary care physician. Keep all scheduled follow-up appointments as recommended.   Comments:   Patient is instructed to take all prescribed medications as recommended. Report any side effects or adverse reactions to your outpatient psychiatrist. Patient is instructed to abstain from alcohol and illegal drugs while on prescription medications. In the event of worsening symptoms, patient is instructed to call the crisis hotline, 911, or go to the nearest emergency department for evaluation and treatment.  Signed: Aldean Baker, NP 07/10/2018, 4:11 PM

## 2018-07-10 NOTE — [facility] Suicide Risk Assessment (Signed)
[facility] Discharge Suicide Risk Assessment   Principal Problem: Major depressive disorder, recurrent severe without psychotic features [facility]) Discharge Diagnoses: Principal Problem:   Major depressive disorder, recurrent severe without psychotic features (HCC) Active Problems:   Alcohol dependence with withdrawal (HCC)   Total Time spent with patient: 45 minutes Patient alert oriented cooperative no cravings tremors or withdrawal no thoughts of harming self or others no acute psychosis stable for release  Mental Status Per Nursing Assessment::   On Admission:  NA  Demographic Factors:  Male, Caucasian and Unemployed  Loss Factors: NA  Historical Factors: NA  Risk Reduction Factors:   Sense of responsibility to family and Religious beliefs about death  Continued Clinical Symptoms:  Alcohol/Substance Abuse/Dependencies  Cognitive Features That Contribute To Risk:  None    Suicide Risk:  Minimal: No identifiable suicidal ideation.  Patients presenting with no risk factors but with morbid ruminations; may be classified as minimal risk based on the severity of the depressive symptoms  Follow-up Information    [facility] Follow up.   Why:  Appointment needed for new patient  Contact information: [address] [place] [address] [phone]           Plan Of Care/Follow-up recommendations:  Activity:  full  Zachary Trulson, MD 07/10/2018, 7:54 AM

## 2018-07-10 NOTE — Plan of Care (Signed)
Discharge note  Patient verbalizes readiness for discharge. Follow up plan explained, AVS, Transition record and SRA given. Prescriptions and teaching provided. Belongings returned and signed for. Suicide safety plan completed and signed. Patient verbalizes understanding. Patient denies SI/HI and assures this Clinical research associate he will seek assistance should that change. Patient discharged to lobby where son was waiting.  Problem: Education: Goal: Utilization of techniques to improve thought processes will improve Outcome: Adequate for Discharge Goal: Knowledge of the prescribed therapeutic regimen will improve Outcome: Adequate for Discharge   Problem: Activity: Goal: Interest or engagement in leisure activities will improve Outcome: Adequate for Discharge Goal: Imbalance in normal sleep/wake cycle will improve Outcome: Adequate for Discharge   Problem: Coping: Goal: Coping ability will improve Outcome: Adequate for Discharge Goal: Will verbalize feelings Outcome: Adequate for Discharge   Problem: Health Behavior/Discharge Planning: Goal: Ability to make decisions will improve Outcome: Adequate for Discharge Goal: Compliance with therapeutic regimen will improve Outcome: Adequate for Discharge   Problem: Role Relationship: Goal: Will demonstrate positive changes in social behaviors and relationships Outcome: Adequate for Discharge   Problem: Safety: Goal: Ability to disclose and discuss suicidal ideas will improve Outcome: Adequate for Discharge Goal: Ability to identify and utilize support systems that promote safety will improve Outcome: Adequate for Discharge   Problem: Self-Concept: Goal: Will verbalize positive feelings about self Outcome: Adequate for Discharge Goal: Level of anxiety will decrease Outcome: Adequate for Discharge   Problem: Education: Goal: Ability to make informed decisions regarding treatment will improve Outcome: Adequate for Discharge   Problem:  Coping: Goal: Coping ability will improve Outcome: Adequate for Discharge   Problem: Health Behavior/Discharge Planning: Goal: Identification of resources available to assist in meeting health care needs will improve Outcome: Adequate for Discharge   Problem: Medication: Goal: Compliance with prescribed medication regimen will improve Outcome: Adequate for Discharge   Problem: Self-Concept: Goal: Ability to disclose and discuss suicidal ideas will improve Outcome: Adequate for Discharge Goal: Will verbalize positive feelings about self Outcome: Adequate for Discharge

## 2018-07-10 NOTE — Progress Notes (Signed)
The focus of this group is to help patients establish daily goals to achieve during treatment and discuss how the patient can incorporate goal setting into their daily lives to aide in recovery. 

## 2019-12-08 ENCOUNTER — Emergency Department (HOSPITAL_COMMUNITY): Payer: Medicaid Other

## 2019-12-08 ENCOUNTER — Other Ambulatory Visit: Payer: Self-pay

## 2019-12-08 ENCOUNTER — Encounter (HOSPITAL_COMMUNITY): Payer: Self-pay | Admitting: Emergency Medicine

## 2019-12-08 ENCOUNTER — Inpatient Hospital Stay (HOSPITAL_COMMUNITY)
Admission: EM | Admit: 2019-12-08 | Discharge: 2019-12-10 | DRG: 641 | Disposition: A | Discharge status: Home / Self Care | Payer: Medicaid Other | Attending: Student in an Organized Health Care Education/Training Program | Admitting: Student in an Organized Health Care Education/Training Program

## 2019-12-08 DIAGNOSIS — F1020 Alcohol dependence, uncomplicated: Secondary | ICD-10-CM

## 2019-12-08 DIAGNOSIS — K760 Fatty (change of) liver, not elsewhere classified: Secondary | ICD-10-CM | POA: Diagnosis present

## 2019-12-08 DIAGNOSIS — Z791 Long term (current) use of non-steroidal anti-inflammatories (NSAID): Secondary | ICD-10-CM

## 2019-12-08 DIAGNOSIS — E860 Dehydration: Secondary | ICD-10-CM | POA: Diagnosis present

## 2019-12-08 DIAGNOSIS — Z20822 Contact with and (suspected) exposure to covid-19: Secondary | ICD-10-CM | POA: Diagnosis present

## 2019-12-08 DIAGNOSIS — Z880 Allergy status to penicillin: Secondary | ICD-10-CM

## 2019-12-08 DIAGNOSIS — R1084 Generalized abdominal pain: Secondary | ICD-10-CM

## 2019-12-08 DIAGNOSIS — Z8249 Family history of ischemic heart disease and other diseases of the circulatory system: Secondary | ICD-10-CM

## 2019-12-08 DIAGNOSIS — F1010 Alcohol abuse, uncomplicated: Secondary | ICD-10-CM | POA: Diagnosis present

## 2019-12-08 DIAGNOSIS — R000 Tachycardia, unspecified: Secondary | ICD-10-CM

## 2019-12-08 DIAGNOSIS — Y906 Blood alcohol level of 120-199 mg/100 ml: Secondary | ICD-10-CM

## 2019-12-08 DIAGNOSIS — D72829 Elevated white blood cell count, unspecified: Secondary | ICD-10-CM | POA: Diagnosis present

## 2019-12-08 DIAGNOSIS — Z823 Family history of stroke: Secondary | ICD-10-CM

## 2019-12-08 DIAGNOSIS — K219 Gastro-esophageal reflux disease without esophagitis: Secondary | ICD-10-CM | POA: Diagnosis present

## 2019-12-08 DIAGNOSIS — Z79899 Other long term (current) drug therapy: Secondary | ICD-10-CM

## 2019-12-08 DIAGNOSIS — R7989 Other specified abnormal findings of blood chemistry: Secondary | ICD-10-CM

## 2019-12-08 DIAGNOSIS — Z833 Family history of diabetes mellitus: Secondary | ICD-10-CM

## 2019-12-08 DIAGNOSIS — G43909 Migraine, unspecified, not intractable, without status migrainosus: Secondary | ICD-10-CM | POA: Diagnosis present

## 2019-12-08 DIAGNOSIS — F13239 Sedative, hypnotic or anxiolytic dependence with withdrawal, unspecified: Secondary | ICD-10-CM | POA: Diagnosis present

## 2019-12-08 DIAGNOSIS — M549 Dorsalgia, unspecified: Secondary | ICD-10-CM

## 2019-12-08 DIAGNOSIS — F419 Anxiety disorder, unspecified: Secondary | ICD-10-CM | POA: Diagnosis present

## 2019-12-08 DIAGNOSIS — F329 Major depressive disorder, single episode, unspecified: Secondary | ICD-10-CM | POA: Diagnosis present

## 2019-12-08 DIAGNOSIS — Z888 Allergy status to other drugs, medicaments and biological substances status: Secondary | ICD-10-CM

## 2019-12-08 DIAGNOSIS — R3911 Hesitancy of micturition: Secondary | ICD-10-CM

## 2019-12-08 DIAGNOSIS — K2970 Gastritis, unspecified, without bleeding: Secondary | ICD-10-CM | POA: Diagnosis present

## 2019-12-08 DIAGNOSIS — N400 Enlarged prostate without lower urinary tract symptoms: Secondary | ICD-10-CM | POA: Diagnosis present

## 2019-12-08 DIAGNOSIS — E8729 Other acidosis: Secondary | ICD-10-CM | POA: Diagnosis present

## 2019-12-08 DIAGNOSIS — I4510 Unspecified right bundle-branch block: Secondary | ICD-10-CM | POA: Diagnosis present

## 2019-12-08 DIAGNOSIS — I10 Essential (primary) hypertension: Secondary | ICD-10-CM | POA: Diagnosis present

## 2019-12-08 DIAGNOSIS — H538 Other visual disturbances: Secondary | ICD-10-CM

## 2019-12-08 DIAGNOSIS — G8929 Other chronic pain: Secondary | ICD-10-CM | POA: Diagnosis present

## 2019-12-08 DIAGNOSIS — F17210 Nicotine dependence, cigarettes, uncomplicated: Secondary | ICD-10-CM

## 2019-12-08 DIAGNOSIS — E872 Acidosis, unspecified: Secondary | ICD-10-CM | POA: Diagnosis present

## 2019-12-08 DIAGNOSIS — J449 Chronic obstructive pulmonary disease, unspecified: Secondary | ICD-10-CM

## 2019-12-08 DIAGNOSIS — R1010 Upper abdominal pain, unspecified: Secondary | ICD-10-CM

## 2019-12-08 DIAGNOSIS — R6510 Systemic inflammatory response syndrome (SIRS) of non-infectious origin without acute organ dysfunction: Secondary | ICD-10-CM | POA: Diagnosis present

## 2019-12-08 DIAGNOSIS — R109 Unspecified abdominal pain: Secondary | ICD-10-CM

## 2019-12-08 DIAGNOSIS — R112 Nausea with vomiting, unspecified: Secondary | ICD-10-CM

## 2019-12-08 HISTORY — DX: Depression, unspecified: F32.A

## 2019-12-08 LAB — COMPREHENSIVE METABOLIC PANEL
ALT: 61 U/L — ABNORMAL HIGH (ref 0–44)
AST: 42 U/L — ABNORMAL HIGH (ref 15–41)
Albumin: 4.4 g/dL (ref 3.5–5.0)
Alkaline Phosphatase: 74 U/L (ref 38–126)
Anion gap: 23 — ABNORMAL HIGH (ref 5–15)
BUN: 42 mg/dL — ABNORMAL HIGH (ref 8–23)
CO2: 18 mmol/L — ABNORMAL LOW (ref 22–32)
Calcium: 9.2 mg/dL (ref 8.9–10.3)
Chloride: 90 mmol/L — ABNORMAL LOW (ref 98–111)
Creatinine, Ser: 1.22 mg/dL (ref 0.61–1.24)
GFR calc Af Amer: >60 mL/min (ref >60)
GFR calc non Af Amer: >60 mL/min (ref >60)
Glucose, Bld: 124 mg/dL — ABNORMAL HIGH (ref 70–99)
Potassium: 3.9 mmol/L (ref 3.5–5.1)
Sodium: 131 mmol/L — ABNORMAL LOW (ref 135–145)
Total Bilirubin: 0.9 mg/dL (ref 0.3–1.2)
Total Protein: 7 g/dL (ref 6.5–8.1)

## 2019-12-08 LAB — MAGNESIUM: Magnesium: 2 mg/dL (ref 1.7–2.4)

## 2019-12-08 LAB — URINALYSIS, ROUTINE W REFLEX MICROSCOPIC
Bilirubin Urine: NEGATIVE
Glucose, UA: NEGATIVE mg/dL
Hgb urine dipstick: NEGATIVE
Ketones, ur: 20 mg/dL — AB
Leukocytes,Ua: NEGATIVE
Nitrite: NEGATIVE
Protein, ur: NEGATIVE mg/dL
Specific Gravity, Urine: 1.035 — ABNORMAL HIGH (ref 1.005–1.030)
pH: 5 (ref 5.0–8.0)

## 2019-12-08 LAB — LIPASE, BLOOD: Lipase: 35 U/L (ref 11–51)

## 2019-12-08 LAB — HIV ANTIBODY (ROUTINE TESTING W REFLEX): HIV Screen 4th Generation wRfx: NONREACTIVE

## 2019-12-08 LAB — CBC
HCT: 49.1 % (ref 39.0–52.0)
Hemoglobin: 17.7 g/dL — ABNORMAL HIGH (ref 13.0–17.0)
MCH: 32.8 pg (ref 26.0–34.0)
MCHC: 36 g/dL (ref 30.0–36.0)
MCV: 90.9 fL (ref 80.0–100.0)
Platelets: 284 10*3/uL (ref 150–400)
RBC: 5.4 MIL/uL (ref 4.22–5.81)
RDW: 12.9 % (ref 11.5–15.5)
WBC: 19.3 10*3/uL — ABNORMAL HIGH (ref 4.0–10.5)
nRBC: 0 % (ref 0.0–0.2)

## 2019-12-08 LAB — RESPIRATORY PANEL BY RT PCR (FLU A&B, COVID)
Influenza A by PCR: NEGATIVE
Influenza B by PCR: NEGATIVE
SARS Coronavirus 2 by RT PCR: NEGATIVE

## 2019-12-08 LAB — PROCALCITONIN: Procalcitonin: <0.1 ng/mL

## 2019-12-08 LAB — PHOSPHORUS: Phosphorus: 2 mg/dL — ABNORMAL LOW (ref 2.5–4.6)

## 2019-12-08 LAB — ETHANOL: Alcohol, Ethyl (B): 177 mg/dL — ABNORMAL HIGH (ref <10)

## 2019-12-08 LAB — LACTIC ACID, PLASMA
Lactic Acid, Venous: 8.3 mmol/L (ref 0.5–1.9)
Lactic Acid, Venous: 6.8 mmol/L (ref 0.5–1.9)
Lactic Acid, Venous: 3.3 mmol/L (ref 0.5–1.9)

## 2019-12-08 MED ORDER — ALBUTEROL SULFATE HFA 108 (90 BASE) MCG/ACT IN AERS
1.0000 | INHALATION_SPRAY | Freq: Four times a day (QID) | RESPIRATORY_TRACT | Status: DC | PRN
  Filled 2019-12-08: qty 6.7

## 2019-12-08 MED ORDER — NICOTINE 21 MG/24HR TD PT24
21.0000 mg | MEDICATED_PATCH | Freq: Every day | TRANSDERMAL | Status: DC
  Administered 2019-12-08 – 2019-12-10 (×3): 21 mg via TRANSDERMAL
  Filled 2019-12-08 (×3): qty 1

## 2019-12-08 MED ORDER — VANCOMYCIN HCL IN DEXTROSE 1-5 GM/200ML-% IV SOLN: 1000.0000 mg | Freq: Two times a day (BID) | INTRAVENOUS | Status: DC

## 2019-12-08 MED ORDER — LORAZEPAM 1 MG PO TABS
1.0000 mg | ORAL_TABLET | ORAL | Status: DC | PRN
  Administered 2019-12-10: 3 mg via ORAL
  Filled 2019-12-08: qty 3

## 2019-12-08 MED ORDER — ACETAMINOPHEN 500 MG PO TABS
1000.0000 mg | ORAL_TABLET | Freq: Once | ORAL | Status: AC
  Administered 2019-12-08: 1000 mg via ORAL
  Filled 2019-12-08: qty 2

## 2019-12-08 MED ORDER — IOHEXOL 300 MG/ML  SOLN
100.0000 mL | Freq: Once | INTRAMUSCULAR | Status: AC | PRN
  Administered 2019-12-08: 100 mL via INTRAVENOUS

## 2019-12-08 MED ORDER — SODIUM CHLORIDE 0.9 % IV BOLUS
1000.0000 mL | Freq: Once | INTRAVENOUS | Status: AC
  Administered 2019-12-08: 1000 mL via INTRAVENOUS

## 2019-12-08 MED ORDER — ACETAMINOPHEN 325 MG PO TABS
650.0000 mg | ORAL_TABLET | Freq: Four times a day (QID) | ORAL | Status: DC | PRN
  Administered 2019-12-09: 650 mg via ORAL
  Filled 2019-12-08 (×2): qty 2

## 2019-12-08 MED ORDER — ACETAMINOPHEN 650 MG RE SUPP: 650.0000 mg | Freq: Four times a day (QID) | RECTAL | Status: DC | PRN

## 2019-12-08 MED ORDER — ONDANSETRON HCL 4 MG/2ML IJ SOLN
4.0000 mg | Freq: Once | INTRAMUSCULAR | Status: AC
  Administered 2019-12-08: 4 mg via INTRAVENOUS
  Filled 2019-12-08: qty 2

## 2019-12-08 MED ORDER — VANCOMYCIN HCL IN DEXTROSE 1-5 GM/200ML-% IV SOLN: 1000.0000 mg | Freq: Once | INTRAVENOUS | Status: DC

## 2019-12-08 MED ORDER — SODIUM CHLORIDE 0.9 % IV SOLN
2.0000 g | Freq: Three times a day (TID) | INTRAVENOUS | Status: DC
  Administered 2019-12-08: 2 g via INTRAVENOUS
  Filled 2019-12-08: qty 2

## 2019-12-08 MED ORDER — LACTATED RINGERS IV BOLUS (SEPSIS)
1000.0000 mL | Freq: Once | INTRAVENOUS | Status: AC
  Administered 2019-12-08: 1000 mL via INTRAVENOUS

## 2019-12-08 MED ORDER — LACTATED RINGERS IV BOLUS (SEPSIS)
500.0000 mL | Freq: Once | INTRAVENOUS | Status: AC
  Administered 2019-12-08: 500 mL via INTRAVENOUS

## 2019-12-08 MED ORDER — THIAMINE HCL 100 MG/ML IJ SOLN
100.0000 mg | Freq: Every day | INTRAMUSCULAR | Status: DC
  Administered 2019-12-09: 100 mg via INTRAVENOUS
  Filled 2019-12-08: qty 2

## 2019-12-08 MED ORDER — ENOXAPARIN SODIUM 40 MG/0.4ML ~~LOC~~ SOLN
40.0000 mg | SUBCUTANEOUS | Status: DC
  Administered 2019-12-08 – 2019-12-09 (×2): 40 mg via SUBCUTANEOUS
  Filled 2019-12-08 (×2): qty 0.4

## 2019-12-08 MED ORDER — METRONIDAZOLE IN NACL 5-0.79 MG/ML-% IV SOLN
500.0000 mg | Freq: Once | INTRAVENOUS | Status: DC
  Filled 2019-12-08: qty 100

## 2019-12-08 MED ORDER — THIAMINE HCL 100 MG PO TABS
100.0000 mg | ORAL_TABLET | Freq: Every day | ORAL | Status: DC
  Administered 2019-12-08 – 2019-12-10 (×2): 100 mg via ORAL
  Filled 2019-12-08 (×3): qty 1

## 2019-12-08 MED ORDER — ONDANSETRON HCL 4 MG/2ML IJ SOLN: 4.0000 mg | Freq: Four times a day (QID) | INTRAMUSCULAR | Status: DC | PRN

## 2019-12-08 MED ORDER — ADULT MULTIVITAMIN W/MINERALS CH
1.0000 | ORAL_TABLET | Freq: Every day | ORAL | Status: DC
  Administered 2019-12-08 – 2019-12-10 (×2): 1 via ORAL
  Filled 2019-12-08 (×3): qty 1

## 2019-12-08 MED ORDER — LORAZEPAM 2 MG/ML IJ SOLN
2.0000 mg | Freq: Once | INTRAMUSCULAR | Status: AC
  Administered 2019-12-08: 2 mg via INTRAVENOUS
  Filled 2019-12-08: qty 1

## 2019-12-08 MED ORDER — POLYETHYLENE GLYCOL 3350 17 G PO PACK: 17.0000 g | PACK | Freq: Every day | ORAL | Status: DC | PRN

## 2019-12-08 MED ORDER — SODIUM CHLORIDE 0.9 % IV SOLN: 2.0000 g | Freq: Once | INTRAVENOUS | Status: DC

## 2019-12-08 MED ORDER — LACTATED RINGERS IV SOLN: INTRAVENOUS | Status: AC

## 2019-12-08 MED ORDER — FOLIC ACID 1 MG PO TABS
1.0000 mg | ORAL_TABLET | Freq: Every day | ORAL | Status: DC
  Administered 2019-12-08 – 2019-12-10 (×2): 1 mg via ORAL
  Filled 2019-12-08 (×3): qty 1

## 2019-12-08 MED ORDER — LORAZEPAM 2 MG/ML IJ SOLN
1.0000 mg | INTRAMUSCULAR | Status: DC | PRN
  Administered 2019-12-09: 1 mg via INTRAVENOUS
  Administered 2019-12-09 (×2): 2 mg via INTRAVENOUS
  Administered 2019-12-09 (×2): 3 mg via INTRAVENOUS
  Administered 2019-12-09: 1 mg via INTRAVENOUS
  Administered 2019-12-09: 2 mg via INTRAVENOUS
  Administered 2019-12-10: 3 mg via INTRAVENOUS
  Administered 2019-12-10: 2 mg via INTRAVENOUS
  Filled 2019-12-08 (×2): qty 2
  Filled 2019-12-08: qty 1
  Filled 2019-12-08: qty 2
  Filled 2019-12-08 (×6): qty 1

## 2019-12-08 MED ORDER — ONDANSETRON HCL 4 MG PO TABS: 4.0000 mg | ORAL_TABLET | Freq: Four times a day (QID) | ORAL | Status: DC | PRN

## 2019-12-08 MED ORDER — VANCOMYCIN HCL 1750 MG/350ML IV SOLN
1750.0000 mg | Freq: Once | INTRAVENOUS | Status: AC
  Administered 2019-12-08: 1750 mg via INTRAVENOUS
  Filled 2019-12-08 (×2): qty 350

## 2019-12-08 NOTE — ED Triage Notes (Signed)
Patient arrives to ED with complaints of generalized abdominal pain since Tuesday 9/21. Pt states the pain is constant and has not improved or worsen over this week. States that he has not been able to eat. Started vomiting yesterday. States drinks ETOH most nights.

## 2019-12-08 NOTE — ED Provider Notes (Signed)
[facility] Provider Note   CSN: [id number] Arrival date & time: 12/08/19  1154     History Chief Complaint  Patient presents with  . Abdominal Pain    Georgina Peerorman E Semple is a 59 y.o. male 6 presented for evaluation nausea, vomiting, abdominal pain.  Patient states on Tuesday, 5 days ago, he developed acute onset epigastric abdominal pain after eating.  Since then, he has had persistent nausea, states he is unable to eat due to the amount of pain and nausea he is in.  He tells me he has been sober for a year and a half, however to help him sleep through the symptoms he has been drinking tequila, which is caused him to vomit.  He denies hematemesis.  He also reports associated headache for 2 weeks, nothing makes it better or worse.  He denies fevers, chills, chest pain, shortness breath, cough, urinary symptoms, normal bowel movements.  Additional history obtained from chart review.  Patient with a history of alcohol abuse (last admitted/seen in the ED in 06/2018 for this), polysubstance abuse, GI bleed, depression, hypertension, COPD.  HPI     Past Medical History:  Diagnosis Date  . Alcohol abuse   . COPD (chronic obstructive pulmonary disease) (HCC)   . Hypertension     Patient Active Problem List   Diagnosis Date Noted  . Alcohol dependence with withdrawal (HCC) 07/08/2018  . Alcohol abuse with alcohol-induced mood disorder (HCC) 07/22/2017  . Adjustment disorder with depressed mood 07/09/2017  . Polysubstance abuse (HCC) 07/09/2017  . Prolonged Q-T interval on ECG   . Suicidal thoughts   . Drug overdose 07/08/2017  . Acute GI bleeding 07/08/2017  . Depression 07/08/2017  . Alcohol dependence with uncomplicated withdrawal (HCC) 12/30/2016  . Major depressive disorder, recurrent severe without psychotic features (HCC) 12/30/2016  . Chest pain 10/28/2015  . Hypertension 10/28/2015  . Alcohol abuse 10/28/2015  . Hyperglycemia  10/28/2015  . Alcohol withdrawal (HCC) 10/28/2015  . Tobacco abuse 10/28/2015  . Chest pain at rest 10/28/2015  . HTN (hypertension), benign     Past Surgical History:  Procedure Laterality Date  . COLONOSCOPY    . MOUTH SURGERY    . NO PAST SURGERIES         Family History  Problem Relation Age of Onset  . Heart attack Paternal Grandfather   . Stroke Mother   . Heart failure Mother   . Heart failure Father     Social History   Tobacco Use  . Smoking status: Current Some Day Smoker    Packs/day: 2.00    Types: Cigarettes  . Smokeless tobacco: Never Used  Vaping Use  . Vaping Use: Never assessed  Substance Use Topics  . Alcohol use: Yes    Comment: BAC was .23 on admission  . Drug use: Yes    Types: Marijuana    Comment: UDS was negative    Home Medications Prior to Admission medications   Medication Sig Start Date End Date Taking? Authorizing Provider  albuterol (VENTOLIN HFA) 108 (90 Base) MCG/ACT inhaler Inhale 1-2 puffs into the lungs every 6 (six) hours as needed for wheezing or shortness of breath. 07/10/18   Malvin JohnsFarah, Brian, MD  cholecalciferol (VITAMIN D3) 25 MCG (1000 UT) tablet Take 1,000 Units by mouth daily.    [provider]  gabapentin (NEURONTIN) 300 MG capsule Take 1 capsule (300 mg total) by mouth 3 (three) times daily. 07/10/18   Malvin JohnsFarah, Brian, MD  lisinopril-hydrochlorothiazide (ZESTORETIC) 10-12.5 MG tablet Take 1 tablet by mouth daily. 05/09/18   [provider]  sertraline (ZOLOFT) 100 MG tablet Take 1 tablet (100 mg total) by mouth daily. 07/11/18   Malvin Johns, MD  traZODone (DESYREL) 100 MG tablet Take 1 tablet (100 mg total) by mouth at bedtime as needed for sleep. 07/10/18   Malvin Johns, MD    Allergies    Penicillins and Statins  Review of Systems   Review of Systems  Gastrointestinal: Positive for abdominal pain, nausea and vomiting.  Neurological: Positive for headaches.  All other systems reviewed and are  negative.   Physical Exam Updated Vital Signs BP (!) 146/84   Pulse (!) 112   Temp 98.9 F (37.2 C) (Oral)   Resp 20   Ht 5\' 11"  (1.803 m)   Wt 81 kg   SpO2 96%   BMI 24.91 kg/m   Physical Exam Vitals and nursing note reviewed.  Constitutional:      General: He is not in acute distress.    Appearance: He is well-developed.     Comments: Appears uncomfortable due to pain, otherwise nontoxic  HENT:     Head: Normocephalic and atraumatic.  Eyes:     Conjunctiva/sclera: Conjunctivae normal.     Pupils: Pupils are equal, round, and reactive to light.  Cardiovascular:     Rate and Rhythm: Normal rate and regular rhythm.     Pulses: Normal pulses.  Pulmonary:     Effort: Pulmonary effort is normal. No respiratory distress.     Breath sounds: Normal breath sounds. No wheezing.     Comments: Speaking in full sentences.  Clear lung sounds in all fields. Abdominal:     General: Bowel sounds are normal. There is no distension.     Palpations: Abdomen is soft.     Tenderness: There is abdominal tenderness in the epigastric area.     Comments: TTP of the epigastric abdomen.  No rigidity, guarding, distention.  Negative rebound.  No peritonitis.  Musculoskeletal:        General: Normal range of motion.     Cervical back: Normal range of motion and neck supple.  Skin:    General: Skin is warm and dry.     Capillary Refill: Capillary refill takes less than 2 seconds.  Neurological:     Mental Status: He is alert and oriented to person, place, and time.     ED Results / Procedures / Treatments   Labs (all labs ordered are listed, but only abnormal results are displayed) Labs Reviewed  COMPREHENSIVE METABOLIC PANEL - Abnormal; Notable for the following components:      Result Value   Sodium 131 (*)    Chloride 90 (*)    CO2 18 (*)    Glucose, Bld 124 (*)    BUN 42 (*)    AST 42 (*)    ALT 61 (*)    Anion gap 23 (*)    All other components within normal limits  CBC -  Abnormal; Notable for the following components:   WBC 19.3 (*)    Hemoglobin 17.7 (*)    All other components within normal limits  URINALYSIS, ROUTINE W REFLEX MICROSCOPIC - Abnormal; Notable for the following components:   Specific Gravity, Urine 1.035 (*)    Ketones, ur 20 (*)    All other components within normal limits  ETHANOL - Abnormal; Notable for the following components:   Alcohol, Ethyl (B) 177 (*)  All other components within normal limits  LACTIC ACID, PLASMA - Abnormal; Notable for the following components:   Lactic Acid, Venous 8.3 (*)    All other components within normal limits  LACTIC ACID, PLASMA - Abnormal; Notable for the following components:   Lactic Acid, Venous 6.8 (*)    All other components within normal limits  RESPIRATORY PANEL BY RT PCR (FLU A&B, COVID)  CULTURE, BLOOD (ROUTINE X 2)  CULTURE, BLOOD (ROUTINE X 2)  LIPASE, BLOOD  PROCALCITONIN  LACTIC ACID, PLASMA  LACTIC ACID, PLASMA    EKG EKG Interpretation  Date/Time:  Sunday December 08 2019 16:32:24 EDT Ventricular Rate:  97 PR Interval:  136 QRS Duration: 100 QT Interval:  376 QTC Calculation: 477 R Axis:   -64 Text Interpretation: Normal sinus rhythm with sinus arrhythmia Left axis deviation Incomplete right bundle branch block Abnormal ECG Since last tracing Left axis deviation and incomplete RBBB are new Confirmed by Susy Frizzle [phone]) on 12/08/2019 4:40:51 PM   Radiology DG Chest 2 View  Result Date: 12/08/2019 CLINICAL DATA:  Sepsis. EXAM: CHEST - 2 VIEW COMPARISON:  October 26, 2019 FINDINGS: The heart size and mediastinal contours are within normal limits. Both lungs are clear. The visualized skeletal structures are unremarkable. IMPRESSION: No active cardiopulmonary disease. Electronically Signed   By: Ted Mcalpine M.D.   On: 12/08/2019 17:03   CT ABDOMEN PELVIS W CONTRAST  Result Date: 12/08/2019 CLINICAL DATA:  Generalized abdominal pain, nausea and vomiting.  EXAM: CT ABDOMEN AND PELVIS WITH CONTRAST TECHNIQUE: Multidetector CT imaging of the abdomen and pelvis was performed using the standard protocol following bolus administration of intravenous contrast. CONTRAST:  OMNIPAQUE IOHEXOL 300 MG/ML  SOLN COMPARISON:  None. FINDINGS: Lower chest: No acute abnormality. Hepatobiliary: No focal liver abnormality is seen. Hepatic steatosis. No gallstones, gallbladder wall thickening, or biliary dilatation. Pancreas: Unremarkable. No pancreatic ductal dilatation or surrounding inflammatory changes. Spleen: Normal in size without focal abnormality. Adrenals/Urinary Tract: Adrenal glands are unremarkable. Kidneys are without renal calculi, focal lesion, or hydronephrosis. Bilateral perinephric fat stranding. Bladder is unremarkable. Stomach/Bowel: Stomach is within normal limits. Appendix appears normal. No evidence of bowel wall thickening, distention, or inflammatory changes. Vascular/Lymphatic: Aortic atherosclerosis. No enlarged abdominal or pelvic lymph nodes. Reproductive: Mild enlargement of the prostate gland. Right testis is in the inguinal canal. Other: No abdominal wall hernia or abnormality. No abdominopelvic ascites. Diffuse fat stranding of the mesenteric fat. Musculoskeletal: Spondylosis of the lumbosacral spine. IMPRESSION: 1. No evidence of acute abnormalities within the abdomen or pelvis. 2. Hepatic steatosis. 3. Mild enlargement of the prostate gland. Please correlate to serum PSA values. 4. Right testis is in the inguinal canal. Please correlate to patient's symptoms. Aortic Atherosclerosis (ICD10-I70.0). Electronically Signed   By: Ted Mcalpine M.D.   On: 12/08/2019 18:10    Procedures .Critical Care Performed by: Alveria Apley, PA-C Authorized by: Alveria Apley, PA-C   Critical care provider statement:    Critical care time (minutes):  45   Critical care time was exclusive of:  Separately billable procedures and treating other  patients and teaching time   Critical care was necessary to treat or prevent imminent or life-threatening deterioration of the following conditions:  Sepsis and dehydration   Critical care was time spent personally by me on the following activities:  Blood draw for specimens, development of treatment plan with patient or surrogate, evaluation of patient's response to treatment, examination of patient, obtaining history from patient or surrogate, ordering and review  of laboratory studies, ordering and performing treatments and interventions, ordering and review of radiographic studies, pulse oximetry, re-evaluation of patient's condition and review of old charts   I assumed direction of critical care for this patient from another provider in my specialty: no   Comments:     Pt with elevated lactic of 8.3, consider sepsis vs alcoholic ketoacidosis. started ivf and abx.    (including critical care time)  Medications Ordered in ED Medications  lactated ringers infusion ( Intravenous New Bag/Given 12/08/19 1724)  ceFEPIme (MAXIPIME) 2 g in sodium chloride 0.9 % 100 mL IVPB (0 g Intravenous Stopped 12/08/19 1722)  vancomycin (VANCOREADY) IVPB 1750 mg/350 mL (1,750 mg Intravenous New Bag/Given 12/08/19 1905)  vancomycin (VANCOCIN) IVPB 1000 mg/200 mL premix (has no administration in time range)  sodium chloride 0.9 % bolus 1,000 mL (0 mLs Intravenous Stopped 12/08/19 1722)  ondansetron (ZOFRAN) injection 4 mg (4 mg Intravenous Given 12/08/19 1611)  acetaminophen (TYLENOL) tablet 1,000 mg (1,000 mg Oral Given 12/08/19 1611)  lactated ringers bolus 1,000 mL (0 mLs Intravenous Stopped 12/08/19 1854)    And  lactated ringers bolus 1,000 mL (0 mLs Intravenous Stopped 12/08/19 1854)    And  lactated ringers bolus 500 mL (0 mLs Intravenous Stopped 12/08/19 1722)  LORazepam (ATIVAN) injection 2 mg (2 mg Intravenous Given 12/08/19 1857)  iohexol (OMNIPAQUE) 300 MG/ML solution 100 mL (100 mLs Intravenous Contrast Given  12/08/19 1734)    ED Course  I have reviewed the triage vital signs and the nursing notes.  Pertinent labs & imaging results that were available during my care of the patient were reviewed by me and considered in my medical decision making (see chart for details).    MDM Rules/Calculators/A&P                          Patient presenting for evaluation of nausea, vomiting, abdominal pain.  On exam, patient appears uncomfortable due to pain.  He does have diffuse tenderness palpation of the abdomen, worse in the epigastric region.  Labs obtained from triage show elevated white count of 19.  Patient with low-grade fever 99.1, heart rate high normal in the upper 90s.  Concern for infection, will obtain CT abdomen pelvis for further evaluation.  We will add on lactic due to patient's report of severe pain.  Lactic acid elevated at 8.3.  30 cc/kg LR bolus given. As pt meets SIRS criteria, code sepsis called. Broad-spectrum antibiotics started to cover for possible bacterial infection, though consider alcoholic ketoacidosis as cause for elevated lactic.  Patient without history of diabetes, doubt DKA.  CT abdomen pelvis negative for acute findings.  Chest x-ray viewed interpreted by me, no pneumonia, proximal effusion.  EKG without significantly prolonged QTC.  Patient becoming mildly tachycardic, consider alcohol withdrawal complicating the picture.  Repeat lactic improved to 6.8.  Patient will need to be admitted for continued observation, symptom management and trending lactic down.  Discussed with internal medicine teaching service, patient to be admitted.  Final Clinical Impression(s) / ED Diagnoses Final diagnoses:  Non-intractable vomiting with nausea, unspecified vomiting type  Upper abdominal pain  Elevated lactic acid level  SIRS (systemic inflammatory response syndrome) [facility])    Rx / DC Orders ED Discharge Orders    None       Alveria Apley, PA-C 12/08/19 2028    Pollyann Savoy, MD 12/08/19 2123

## 2019-12-08 NOTE — Hospital Course (Addendum)
   High Anion Gap Metabolic Acidosis due to Lactic acidosis Patient presented with significant abdominal pain with nausea. Patient had tylenol 650 q 6 PRN for pain. Patient received 1 round of vancomycin and cefepime due to concern for infection with elevated LA of 8.3. Patient also had significant leukocytosis. Patient was also treated with fluids. Lactic Acid trended downward. Phos was low at presentation and replenished.There was no clear etiology for infection. CXR found no active cardiopulmonary disease. CT Abdomen Pelvis found no evidence of acute abnormalities with incidental finding that patient's right testis was noted to be in the inguinal canal.  Therefore abx were held after 1 dose and patient continued to receive fluids and some PO intake. Lactic acid and leukocytosis resolved. RUQ [place] was completed and gallbladder was insignificant but there was an incidental finding regarding patient's liver.  There was increased echogenicity of hepatic parenchyma suggesting hepatic steatosis and 57mm echogenic focus in R hepatic lobe likely representing hemangioma in the absence of malignancy. Patient's diet was advanced to full after RUQ.  GERD Patient abdominal pain was likely 2/2 GERD. Placed on Protonix.    Alcohol use disorder CT Abdomen Pelvis found evidence of hepatic steatosis and a mildly enlarged prostate. Patient reported sobriety for 1.5 years that was broken 2 days prior to presentation due to the pain. Patient also reported that he had recently self-titrated down on his klonopin about 4-6 weeks prior to presentation. Patient was placed on CIWA with Ativan and did require Ativan during hospitalization. Patient was also placed on multivitamins, folate, and thiamine. Concern that patient's presentation may have been 2/2 withdrawal from sobriety and discontinuation of klonopin in combination with severe dehydration. Patient was restarted on Klonopin with decreased frequency at discharge with  instructions to taper with the assistance of his PCP. Patient was also started on Lexapro due to his reports of significant anxiety, PMH of anxiety, wanting to discontinue klonopin, and history of use of SSRI in the past. Patient did not report a reason for discontinuation of the SSRI in the past instead reporting that he had had success on it.   HTN HTN was stable once treated for pain and with CIWA.    Tobacco use disorder Smoked about 3 pack/day for 33 years, quite for 8 years and started smoking 1 pack.day 3 years ago. Quit on his own the first time and would like to quit again. Nicotine patch 21mg  provided inpatient.

## 2019-12-08 NOTE — H&P (Addendum)
Date: 12/08/2019               Patient Name:  Zachary [name] MRN: [id number]  DOB: Sep 11, 1960 Age / Sex: 59 y.o., male   PCP: Patient, No Pcp Per         Medical Service: Internal Medicine Teaching Service         Attending Physician: Dr. Oswaldo Done, Marquita Palms, *    First Contact: Dr. Eliseo Gum, MD Pager: [phone]  Second Contact: Dr. Dellia Cloud, DO Pager: [phone]       After Hours (After 5p/  First Contact Pager: [phone]  weekends / holidays): Second Contact Pager: [phone]   Chief Complaint: Abdominal pain  History of Present Illness: [name] is a 59 y.o. male w/ PMH of alcohol abuse (last admitted/seen in the ED in 06/2018 for this), polysubstance abuse, GI bleed, depression, hypertension, COPD who presented to the ED for an evaluation of abdominal pain. Pt states he was in his usual state of health until this Tuesday when he had a meal consisting of peanut butter and quinoa and had acute onset abdominal pain. He describes his abdominal pain as 10/10 pinpoint pain that is intermittent, non-positional, and is primarily in the epigastric region. He mentions associated nausea, vomiting and loss of appetite. States he had history of heartburn but his abdominal pain felt very different from his heartburn. Denies any blood or bilious emesis. His abdominal pain gradually worsened later the week and exacerbated his chronic anxiety. States he had difficulty picking up his anxiety meds due to his primary care office being close this past Friday so he bought a bottle of Tequila which helped his anxiety as well as his abdominal pain. He finished a fifth of Tequila and finished that bottle and had another bottle on Saturday. He mentions the Brendia Sacks helped his anxiety but did not help his abdominal pain. He mentions that he has not been able to eat for 4 days so he came to the ED for evaluation at the urging of his son.  On ROS, he mentions recurrent migraines which he endorsed at  admission. Also had some chronic back pain but not in acute exacerbation. Denies any fevers, chills, chest pain, dyspnea, leg swelling, dysuria, diarrhea, or constipation. Has chronic blurry vision. Mentions some urinary hesitancy.   Of note, patient mentions that he works in [employer] and prior to onset of systems, he had been working in the heat and had 'heat exhaustion' 6 weeks ago requiring medical attention. He also states he had "heat stroke" 15 years ago.   Meds:  Current Meds  Medication Sig   albuterol (VENTOLIN HFA) 108 (90 Base) MCG/ACT inhaler Inhale 1-2 puffs into the lungs every 6 (six) hours as needed for wheezing or shortness of breath.   cholecalciferol (VITAMIN D3) 25 MCG (1000 UT) tablet Take 1,000 Units by mouth daily.   gabapentin (NEURONTIN) 300 MG capsule Take 1 capsule (300 mg total) by mouth 3 (three) times daily.   ibuprofen (ADVIL) 600 MG tablet Take 600 mg by mouth 3 (three) times daily.   lisinopril-hydrochlorothiazide (ZESTORETIC) 10-12.5 MG tablet Take 1 tablet by mouth daily.   traZODone (DESYREL) 100 MG tablet Take 1 tablet (100 mg total) by mouth at bedtime as needed for sleep.    Allergies: Allergies as of 12/08/2019 - Review Complete 12/08/2019  Allergen Reaction Noted   Penicillins Other (See Comments) 10/28/2015   Statins Palpitations 10/28/2015   Past Medical History:  Diagnosis Date   Alcohol abuse    COPD (chronic obstructive pulmonary disease) (HCC)    Depression    Hypertension     Family History: Brother had diabetes. Father has heart disease with a history of heart attacks. Mother had heart failure, stroke, brain tumor as well as brain aneurysm. Sister had a brain tumor.  Social History: Works in [employer]. 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 Lives alone with 2 dogs but children lives nearby. Smokes 3 pack daily for 33 years and had quit for 8 years. However have resumed the habit for the last 3 years at 1 pack daily.  Mentions drinking a fifth of hard liquor + 12 pack of beer daily until about a year and a half ago when he became sober and had been sober until this weekend. Mentions using CBD but no other illicit drug use.  Review of Systems: A complete ROS was negative except as per HPI.  15  Physical Exam: Blood pressure (!) 162/88, pulse (!) 114, temperature 98.9 F (37.2 C), temperature source Oral, resp. rate 17, height 5\' 11"  (1.803 m), weight 81 kg, SpO2 97 %.  General: Pleasant, well-appearing middle-age man laying in bed. No acute distress. Head: Normocephalic. Atraumatic. CV: RRR. No murmurs, rubs, or gallops. No LE edema Pulmonary: Lungs CTAB. Normal effort. No wheezing or rales. Abdominal: Soft, nontender, nondistended. Mild tenderness to palpation of the epigastric area. No rebound tenderness. Negative Murphy's sign. Normal bowel sounds.  Extremities: Palpable pulses. Normal ROM. Skin: Warm and dry. No obvious rash or lesions. Neuro: A&Ox3. Moves all extremities. Normal sensation. No focal deficit. Psych: Normal mood and affect   EKG: personally reviewed my interpretation is NSR, LAD and incomplete LBBB.  CXR: personally reviewed my interpretation is no cardiopulmonary disease  CT abd/pelvis: Hepatic steatosis. No acute abnormalities. Mild prostate gland enlargement.   Assessment & Plan by Problem:  Principal Problem:   High anion gap metabolic acidosis Active Problems:   Alcohol use disorder   Lactic acidosis  Zachary [name] is a 59 y.o. male w/ PMH of alcohol abuse, polysubstance abuse, GI bleed, depression, hypertension, COPD who presented to the ED for an evaluation of abdominal pain and found to have gastritis. Hospital course complicated by elevated lactic acid and white count with no source of infection.  #High Anion Gap Metabolic Acidosis due to Lactic acidosis Patient with hx of alcohol abuse presented to the ED with 6 days of abdominal pain w/ associated N/V and loss  appetite but no constipation or diarrhea. Exam significant with mild ttp of epigastric area, no murphy sign or rebound tenderness. Severely elevated lactic of 8.3, white count of 19.3, normal lipase and UA showing ketones. Pt is afebrile w/ no source of infection. Abd pain improved with dilaudid in the ED. Differential diagnosis include PUD, gastritis, mesenteric ischemia. Would likely require outpatient EGD for further evaluation of symptoms.  --S/p tylenol 1000 mg x1 dose --S/p IVF bolus --LR 500 mL/hr for 20 hrs --Tylenol 650 mg Q6H prn for pain --Zofran 4 mg PO or IV Q6H prn --Outpatient GI follow for possible EGD.  #SIRS Patient was found to have an isolated white count of 19.3, a lactic of 8.3, hypertensive and tachycardic on admission. UA does not look infected. No current source of infection. Normal mag and mildly low phos. Lactate trending down. Will continue to watch.  --S/p IV Vancomycin 1750 mg --F/u bcx --F/u pro-calcitonin --Monitor vitals --AM CBC  #Alcohol abuse Patient significant ETOH  use disorder and withdraw who was sober for 1.5 years until the weekend. Last drink was Saturday.  --Ethanol positive (177 mg/dL) --CIWA w/ ativan protocol --Folic acid 1 mg po daily --IV Thiamine 100 mg daily --Multivitamin 1 tab daily  #HTN Patient found to be hypertensive to a high of 162/88 in the ED. On Lisinopril-HCTZ 10-12.5 mg at home. Reports chronic blurry vision and migraines.  --Control pain --Daily vitals.   #COPD Stable. Satting well on RA. No wheezing or rales on exam. On albuterol as needed at home. --Albuterol 1-2 puffs q6h prn --Pulse Ox  #Depression Stable. On trazodone 100 mg at home  #Smoking Smoked about 3 pack/day for 33 years, quite for 8 years and started smoking 1 pack.day 3 years ago. Quite on his own the first time and would like to quite again. --Nicotine patch --Outpatient smoking cessation resources   CODE STATUS: Full Code DIET: Regular PPx:  Lovenox 40 mg SubQ daily  Dispo: Admit patient to Observation with expected length of stay less than 2 midnights.   Signed: Steffanie Rainwater, MD 12/08/2019, 9:53 PM  Pager: [phone] Internal Medicine Teaching Service After 5pm on weekdays and 1pm on weekends: On Call pager: [phone]

## 2019-12-08 NOTE — Progress Notes (Signed)
Communicated with bedside ED RN who obtained 1st BC and then gave Abx's as ordered per MD as to not to delay Abx than obtained 2nd [facility]

## 2019-12-08 NOTE — Progress Notes (Signed)
Pharmacy Antibiotic Note  Zachary [name] is a 59 y.o. male admitted on 12/08/2019 with sepsis.  Pharmacy has been consulted for Cefepime and Vancomycin dosing.   Height: 5\' 11"  (180.3 cm) Weight: 81 kg (178 lb 9.2 oz) IBW/kg (Calculated) : 75.3  Temp (24hrs), Avg:98.8 F (37.1 C), Min:98.4 F (36.9 C), Max:99.1 F (37.3 C)  Recent Labs  Lab 12/08/19 1225 12/08/19 1519  WBC 19.3*  --   CREATININE 1.22  --   LATICACIDVEN  --  8.3*    Estimated Creatinine Clearance: 67.7 mL/min (by C-G formula based on SCr of 1.22 mg/dL).    Allergies  Allergen Reactions  . Penicillins Other (See Comments)    Childhood allergy Reaction:  Unknown  Has patient had a PCN reaction causing immediate rash, facial/tongue/throat swelling, SOB or lightheadedness with hypotension: Unknown Has patient had a PCN reaction causing severe rash involving mucus membranes or skin necrosis: Unknown Has patient had a PCN reaction that required hospitalization Unknown Has patient had a PCN reaction occurring within the last 10 years: No If all of the above answers are "NO", then may proceed with Cephalosporin use.  . Statins Palpitations    Antimicrobials this admission: 9/26 Cefepime >>  9/26 Vancomycin >>   Dose adjustments this admission: N/a  Microbiology results: Pending   Plan:  - Cefepime 2g IV q8h - Vancomycin 1750mg  IV x 1 dose  - Followed by Vancomycin 1000mg  IV q12h - Goal trough ~ 15 - Monitor patients renal function and urine output  - De-escalate ABX when appropriate   Thank you for allowing pharmacy to be a part of this patient's care.  10/26 PharmD. BCPS 12/08/2019 4:35 PM

## 2019-12-08 NOTE — ED Notes (Signed)
Pt transported to CT at this time.

## 2019-12-08 NOTE — ED Notes (Signed)
Date and time results received: 12/08/19 1615 (use smartphrase ".now" to insert current time)  Test: lactic Critical Value: 8.3  Name of Provider Notified: Bernette Mayers MD  Orders Received? Or Actions Taken?: awaiting orders

## 2019-12-09 ENCOUNTER — Other Ambulatory Visit (HOSPITAL_COMMUNITY): Payer: Self-pay

## 2019-12-09 ENCOUNTER — Observation Stay (HOSPITAL_COMMUNITY): Payer: Medicaid Other

## 2019-12-09 DIAGNOSIS — I10 Essential (primary) hypertension: Secondary | ICD-10-CM | POA: Diagnosis present

## 2019-12-09 DIAGNOSIS — G8929 Other chronic pain: Secondary | ICD-10-CM | POA: Diagnosis present

## 2019-12-09 DIAGNOSIS — E860 Dehydration: Secondary | ICD-10-CM

## 2019-12-09 DIAGNOSIS — Z791 Long term (current) use of non-steroidal anti-inflammatories (NSAID): Secondary | ICD-10-CM | POA: Diagnosis not present

## 2019-12-09 DIAGNOSIS — E872 Acidosis, unspecified: Secondary | ICD-10-CM | POA: Diagnosis present

## 2019-12-09 DIAGNOSIS — Z79899 Other long term (current) drug therapy: Secondary | ICD-10-CM | POA: Diagnosis not present

## 2019-12-09 DIAGNOSIS — F1010 Alcohol abuse, uncomplicated: Secondary | ICD-10-CM | POA: Diagnosis present

## 2019-12-09 DIAGNOSIS — D72829 Elevated white blood cell count, unspecified: Secondary | ICD-10-CM | POA: Diagnosis present

## 2019-12-09 DIAGNOSIS — Z833 Family history of diabetes mellitus: Secondary | ICD-10-CM | POA: Diagnosis not present

## 2019-12-09 DIAGNOSIS — Z8249 Family history of ischemic heart disease and other diseases of the circulatory system: Secondary | ICD-10-CM | POA: Diagnosis not present

## 2019-12-09 DIAGNOSIS — J449 Chronic obstructive pulmonary disease, unspecified: Secondary | ICD-10-CM | POA: Diagnosis present

## 2019-12-09 DIAGNOSIS — E8729 Other acidosis: Secondary | ICD-10-CM | POA: Diagnosis present

## 2019-12-09 DIAGNOSIS — F419 Anxiety disorder, unspecified: Secondary | ICD-10-CM | POA: Diagnosis present

## 2019-12-09 DIAGNOSIS — Z20822 Contact with and (suspected) exposure to covid-19: Secondary | ICD-10-CM | POA: Diagnosis present

## 2019-12-09 DIAGNOSIS — K2970 Gastritis, unspecified, without bleeding: Secondary | ICD-10-CM | POA: Diagnosis not present

## 2019-12-09 DIAGNOSIS — Z880 Allergy status to penicillin: Secondary | ICD-10-CM | POA: Diagnosis not present

## 2019-12-09 DIAGNOSIS — K219 Gastro-esophageal reflux disease without esophagitis: Secondary | ICD-10-CM | POA: Diagnosis present

## 2019-12-09 DIAGNOSIS — K760 Fatty (change of) liver, not elsewhere classified: Secondary | ICD-10-CM | POA: Diagnosis present

## 2019-12-09 DIAGNOSIS — M549 Dorsalgia, unspecified: Secondary | ICD-10-CM | POA: Diagnosis present

## 2019-12-09 DIAGNOSIS — F13239 Sedative, hypnotic or anxiolytic dependence with withdrawal, unspecified: Secondary | ICD-10-CM | POA: Diagnosis present

## 2019-12-09 DIAGNOSIS — R6510 Systemic inflammatory response syndrome (SIRS) of non-infectious origin without acute organ dysfunction: Secondary | ICD-10-CM | POA: Diagnosis present

## 2019-12-09 DIAGNOSIS — G43909 Migraine, unspecified, not intractable, without status migrainosus: Secondary | ICD-10-CM | POA: Diagnosis present

## 2019-12-09 DIAGNOSIS — N400 Enlarged prostate without lower urinary tract symptoms: Secondary | ICD-10-CM | POA: Diagnosis present

## 2019-12-09 DIAGNOSIS — F17210 Nicotine dependence, cigarettes, uncomplicated: Secondary | ICD-10-CM | POA: Diagnosis present

## 2019-12-09 DIAGNOSIS — F329 Major depressive disorder, single episode, unspecified: Secondary | ICD-10-CM | POA: Diagnosis present

## 2019-12-09 DIAGNOSIS — Z888 Allergy status to other drugs, medicaments and biological substances status: Secondary | ICD-10-CM | POA: Diagnosis not present

## 2019-12-09 LAB — COMPREHENSIVE METABOLIC PANEL
ALT: 38 U/L (ref 0–44)
AST: 36 U/L (ref 15–41)
Albumin: 3.1 g/dL — ABNORMAL LOW (ref 3.5–5.0)
Alkaline Phosphatase: 40 U/L (ref 38–126)
Anion gap: 8 (ref 5–15)
BUN: 17 mg/dL (ref 8–23)
CO2: 26 mmol/L (ref 22–32)
Calcium: 8.3 mg/dL — ABNORMAL LOW (ref 8.9–10.3)
Chloride: 101 mmol/L (ref 98–111)
Creatinine, Ser: 0.65 mg/dL (ref 0.61–1.24)
GFR calc Af Amer: >60 mL/min (ref >60)
GFR calc non Af Amer: >60 mL/min (ref >60)
Glucose, Bld: 104 mg/dL — ABNORMAL HIGH (ref 70–99)
Potassium: 3.4 mmol/L — ABNORMAL LOW (ref 3.5–5.1)
Sodium: 135 mmol/L (ref 135–145)
Total Bilirubin: 1.2 mg/dL (ref 0.3–1.2)
Total Protein: 4.9 g/dL — ABNORMAL LOW (ref 6.5–8.1)

## 2019-12-09 LAB — CBC
HCT: 29.4 % — ABNORMAL LOW (ref 39.0–52.0)
Hemoglobin: 10.6 g/dL — ABNORMAL LOW (ref 13.0–17.0)
MCH: 33 pg (ref 26.0–34.0)
MCHC: 36.1 g/dL — ABNORMAL HIGH (ref 30.0–36.0)
MCV: 91.6 fL (ref 80.0–100.0)
Platelets: 127 10*3/uL — ABNORMAL LOW (ref 150–400)
RBC: 3.21 MIL/uL — ABNORMAL LOW (ref 4.22–5.81)
RDW: 13 % (ref 11.5–15.5)
WBC: 9.1 10*3/uL (ref 4.0–10.5)
nRBC: 0 % (ref 0.0–0.2)

## 2019-12-09 LAB — LACTIC ACID, PLASMA
Lactic Acid, Venous: 1 mmol/L (ref 0.5–1.9)
Lactic Acid, Venous: 3.8 mmol/L (ref 0.5–1.9)

## 2019-12-09 LAB — PHOSPHORUS
Phosphorus: 1.5 mg/dL — ABNORMAL LOW (ref 2.5–4.6)
Phosphorus: 1 mg/dL — CL (ref 2.5–4.6)

## 2019-12-09 LAB — TROPONIN I (HIGH SENSITIVITY)
Troponin I (High Sensitivity): 35 ng/L — ABNORMAL HIGH (ref <18)
Troponin I (High Sensitivity): 29 ng/L — ABNORMAL HIGH (ref <18)

## 2019-12-09 LAB — MAGNESIUM: Magnesium: 1.9 mg/dL (ref 1.7–2.4)

## 2019-12-09 LAB — GLUCOSE, CAPILLARY: Glucose-Capillary: 103 mg/dL — ABNORMAL HIGH (ref 70–99)

## 2019-12-09 LAB — CK: Total CK: 210 U/L (ref 49–397)

## 2019-12-09 MED ORDER — PANTOPRAZOLE SODIUM 40 MG IV SOLR: 40.0000 mg | Freq: Once | INTRAVENOUS | Status: DC

## 2019-12-09 MED ORDER — POTASSIUM PHOSPHATES 15 MMOLE/5ML IV SOLN
45.0000 mmol | Freq: Once | INTRAVENOUS | Status: AC
  Administered 2019-12-09: 45 mmol via INTRAVENOUS
  Filled 2019-12-09: qty 15

## 2019-12-09 MED ORDER — POTASSIUM CHLORIDE CRYS ER 20 MEQ PO TBCR: 40.0000 meq | EXTENDED_RELEASE_TABLET | Freq: Once | ORAL | Status: DC

## 2019-12-09 MED ORDER — SODIUM PHOSPHATES 45 MMOLE/15ML IV SOLN
15.0000 mmol | Freq: Once | INTRAVENOUS | Status: AC
  Administered 2019-12-09: 15 mmol via INTRAVENOUS
  Filled 2019-12-09: qty 5

## 2019-12-09 MED ORDER — LACTATED RINGERS IV BOLUS
1000.0000 mL | Freq: Once | INTRAVENOUS | Status: AC
  Administered 2019-12-09: 1000 mL via INTRAVENOUS

## 2019-12-09 MED ORDER — PANTOPRAZOLE SODIUM 40 MG IV SOLR
40.0000 mg | Freq: Every day | INTRAVENOUS | Status: DC
  Administered 2019-12-09 (×2): 40 mg via INTRAVENOUS
  Filled 2019-12-09 (×3): qty 40

## 2019-12-09 MED ORDER — PHENOL 1.4 % MT LIQD
1.0000 | OROMUCOSAL | Status: DC | PRN
  Administered 2019-12-09: 1 via OROMUCOSAL
  Filled 2019-12-09: qty 177

## 2019-12-09 NOTE — Plan of Care (Signed)

## 2019-12-09 NOTE — Progress Notes (Signed)
Subjective: Patient doing well this AM. He reports significant improvement in his abdominal pain. Patient had no overnight acute events. Patient does report that he was attempting to contact his PCP on Friday, but they were closed. He reports that he was trying to get a rx of klonopin. He used to take klononpin 1mg  QID but reports "I tapered myself" and completely stopped 1 month ago.   Objective:  Vital signs in last 24 hours: Vitals:   12/09/19 0328 12/09/19 0349 12/09/19 0507 12/09/19 1019  BP: (!) 112/54 140/79 135/80 (!) 154/73  Pulse: 95 91 90 91  Resp: (!) 22 18 18 17   Temp: 99.2 F (37.3 C) 98.1 F (36.7 C) 98 F (36.7 C) 98.3 F (36.8 C)  TempSrc: Oral  Oral Oral  SpO2: 96% 98% 98% 98%  Weight:      Height:       Physical Exam Constitutional:      Appearance: He is well-developed and normal weight.  Cardiovascular:     Rate and Rhythm: Normal rate and regular rhythm.  Pulmonary:     Effort: Pulmonary effort is normal.     Breath sounds: Normal breath sounds.  Abdominal:     General: Abdomen is flat.     Palpations: Abdomen is soft. There is no mass.     Tenderness: There is no abdominal tenderness.  Skin:    General: Skin is warm and dry.  Neurological:     General: No focal deficit present.     Mental Status: He is alert.  Psychiatric:        Mood and Affect: Mood normal.     Assessment/Plan:  Principal Problem:   High anion gap metabolic acidosis Active Problems:   Alcohol use disorder   Lactic acidosis  Mr. Dooly a 59 y.o.male w/ PMH of alcohol abuse, polysubstance abuse, GI bleed, depression, hypertension, COPD who presented to the ED for an evaluation of abdominal pain and found to have gastritis. Hospital course complicated by elevated lactic acid and white count with no source of infection.  #High Anion Gap Metabolic Acidosis due to Lactic acidosis Trending Lactic Acid 8.3>6.8>3.3>3.8>1.0. Likely etiology for elevated Lactic acidosis,  leukocytosis, and elevated RR cyrptogenic at this time. Patient received Phos for low phosphorous of 2.0. Patient Mg 2+ was WNL. Bcx has no growth at 12 h. Patient has been afebrile. Patient does not have tachypnea this AM. Patient did not appear septic or ill on exam. Patient also reported drinking fluids this AM. Patient has responded well to 5L fluids over the past 24h and may have mIVF discontinued is patient is able to continue advancing diet. Patient will be temporarily NPO for RUQ Cristino Martes as there is concern that patient's presentation may be related to cholecystitis. Patient had father who had emergent gallbladder removal. There is also concern that patient's initial presentation may have been a symptom of withdrawal. Patient has a history of being on klonopin scheduled and recently reports stopping the medication. He also has a history of EtOH use disorder and is known to the behavioral health hospital for being treated for withdrawal, depression, and SI. Patient EKG was NSR at admission. Another possible etiology is GERD.  -- F/u CK -- F/u Trp x2 --F/u Mg and Phos -- F/u CMP --S/p tylenol 1000 mg x1 dose -- IVF bolus --LR  34mL/hr for 20 hrs --Tylenol 650 mg Q6H prn for pain --Zofran 4 mg PO  Q6H prn --Outpatient GI follow for possible EGD.   #  Alcohol abuse Patient significant ETOH use disorder and withdraw who was sober for 1.5 years until the weekend. Last drink was Saturday. Patient has a hx of hospitalization at [facility] for Alcohol Withdrawal. Patient also has a psychiatric history of MDD w/ psychosis and SI.  May need to consider medication for anxiety for this patient.  --CIWA w/ ativan protocol --Folic acid 1 mg po daily --IV Thiamine 100 mg daily --Multivitamin 1 tab daily  #HTN BP 145/75-154/73. On Lisinopril-HCTZ 10-12.5 mg at home. Reports chronic blurry vision and migraines.  --Control pain --Daily vitals.    #COPD Stable. Satting well on RA. No wheezing or rales on exam.  On albuterol as needed at home. --Albuterol 1-2 puffs q6h prn --Pulse Ox  #Depression Stable. On trazodone 100 mg at home  #Smoking Smoked about 3 pack/day for 33 years, quite for 8 years and started smoking 1 pack.day 3 years ago. Quit on his own the first time and would like to quit again. --Nicotine patch 21mg  --Outpatient smoking cessation resources  Prior to Admission Living Arrangement: Home Anticipated Discharge Location: Home Barriers to Discharge: Treatment Dispo: Anticipated discharge in approximately 1-2 day(s).   , MD 12/09/2019, 11:11 AM [phone] After 5pm on weekdays and 1pm on weekends: On Call pager [phone]

## 2019-12-10 DIAGNOSIS — R1011 Right upper quadrant pain: Secondary | ICD-10-CM

## 2019-12-10 LAB — BASIC METABOLIC PANEL
Anion gap: 7 (ref 5–15)
BUN: 12 mg/dL (ref 8–23)
CO2: 27 mmol/L (ref 22–32)
Calcium: 8.1 mg/dL — ABNORMAL LOW (ref 8.9–10.3)
Chloride: 102 mmol/L (ref 98–111)
Creatinine, Ser: 0.61 mg/dL (ref 0.61–1.24)
GFR calc Af Amer: >60 mL/min (ref >60)
GFR calc non Af Amer: >60 mL/min (ref >60)
Glucose, Bld: 101 mg/dL — ABNORMAL HIGH (ref 70–99)
Potassium: 4 mmol/L (ref 3.5–5.1)
Sodium: 136 mmol/L (ref 135–145)

## 2019-12-10 LAB — CBC WITH DIFFERENTIAL/PLATELET
Abs Immature Granulocytes: 0.07 10*3/uL (ref 0.00–0.07)
Basophils Absolute: 0 10*3/uL (ref 0.0–0.1)
Basophils Relative: 0 %
Eosinophils Absolute: 0.1 10*3/uL (ref 0.0–0.5)
Eosinophils Relative: 1 %
HCT: 28.7 % — ABNORMAL LOW (ref 39.0–52.0)
Hemoglobin: 10.3 g/dL — ABNORMAL LOW (ref 13.0–17.0)
Immature Granulocytes: 1 %
Lymphocytes Relative: 24 %
Lymphs Abs: 1.6 10*3/uL (ref 0.7–4.0)
MCH: 33.4 pg (ref 26.0–34.0)
MCHC: 35.9 g/dL (ref 30.0–36.0)
MCV: 93.2 fL (ref 80.0–100.0)
Monocytes Absolute: 0.9 10*3/uL (ref 0.1–1.0)
Monocytes Relative: 13 %
Neutro Abs: 4.1 10*3/uL (ref 1.7–7.7)
Neutrophils Relative %: 61 %
Platelets: 122 10*3/uL — ABNORMAL LOW (ref 150–400)
RBC: 3.08 MIL/uL — ABNORMAL LOW (ref 4.22–5.81)
RDW: 13.1 % (ref 11.5–15.5)
WBC: 6.8 10*3/uL (ref 4.0–10.5)
nRBC: 0 % (ref 0.0–0.2)

## 2019-12-10 LAB — PHOSPHORUS: Phosphorus: 3.5 mg/dL (ref 2.5–4.6)

## 2019-12-10 MED ORDER — CLONAZEPAM 1 MG PO TABS: 1.0000 mg | ORAL_TABLET | Freq: Two times a day (BID) | ORAL | 0 refills | Status: DC

## 2019-12-10 MED ORDER — ONDANSETRON HCL 4 MG PO TABS: 4.0000 mg | ORAL_TABLET | Freq: Four times a day (QID) | ORAL | 0 refills | Status: DC | PRN

## 2019-12-10 MED ORDER — PANTOPRAZOLE SODIUM 40 MG PO TBEC: 40.0000 mg | DELAYED_RELEASE_TABLET | Freq: Every day | ORAL | Status: DC

## 2019-12-10 MED ORDER — ESCITALOPRAM OXALATE 10 MG PO TABS
10.0000 mg | ORAL_TABLET | Freq: Every day | ORAL | 0 refills | Status: DC
Start: 2019-12-11 — End: 2021-07-24

## 2019-12-10 MED ORDER — ESCITALOPRAM OXALATE 10 MG PO TABS
10.0000 mg | ORAL_TABLET | Freq: Every day | ORAL | Status: DC
  Administered 2019-12-10: 10 mg via ORAL
  Filled 2019-12-10: qty 1

## 2019-12-10 MED ORDER — PANTOPRAZOLE SODIUM 40 MG PO TBEC
40.0000 mg | DELAYED_RELEASE_TABLET | Freq: Every day | ORAL | 0 refills | Status: DC
Start: 2019-12-10 — End: 2021-07-24

## 2019-12-10 MED FILL — CLONAZEPAM 1 MG TABS: 1 | 15 days supply | Qty: 30 | Fill #0

## 2019-12-10 MED FILL — ESCITALOPRAM 10 MG TABLET: 10 | 30 days supply | Qty: 30 | Fill #0

## 2019-12-10 MED FILL — PANTOPRAZOLE SOD DR 40 MG T: 40 | 30 days supply | Qty: 30 | Fill #0

## 2019-12-10 MED FILL — ONDANSETRON HCL 4 MG TABLET: 4 | 2 days supply | Qty: 10 | Fill #0

## 2019-12-10 NOTE — Plan of Care (Signed)

## 2019-12-10 NOTE — TOC CAGE-AID Note (Signed)
Transition of Care [facility]) - CAGE-AID Screening   Patient Details  Name: Zachary [name] MRN: [id number] Date of Birth: 06/01/1960  Transition of Care [facility]) CM/SW Contact:    Emeterio Reeve, [place] Phone Number: 12/10/2019, 11:31 AM   Clinical Narrative:  CSW met with pt at bedside. CSW introduced self and explained her role at the hospital.  Pt stated "I used to drink but I don't anymore, that was just a one time thing because I was sick". Pt denies substance use.  Pt declined resources, education and counseling.   CAGE-AID Screening:    Have You Ever Felt You Ought to Cut Down on Your Drinking or Drug Use?: Yes Have People Annoyed You By Critizing Your Drinking Or Drug Use?: Yes Have You Felt Bad Or Guilty About Your Drinking Or Drug Use?: No Have You Ever Had a Drink or Used Drugs First Thing In The Morning to Steady Your Nerves or to Get Rid of a Hangover?: No CAGE-AID Score: 2  Substance Abuse Education Offered: Yes  Substance abuse interventions: Patient Counseling   Emeterio Reeve, Latanya Presser, [place] Social Worker [phone]

## 2019-12-10 NOTE — Discharge Instructions (Signed)
Dear Georgina Peer,   Thank you so much for allowing [place] to be part of your care!  You were admitted to [facility] for you abdominal pain complicated by elevated lab values suggestive of possible infection. No source of infection was found and you responded well to increased fluid intake.   POST-HOSPITAL & CARE INSTRUCTIONS 1. Please follow up with your primary care provider following this hospitalization to address: "slow taper off klonopin", "response to starting Lexapro," " continuing sobriety", and "seeking therapy for anxiety." 2. Please let PCP/Specialists know of any changes that were made.  3. Please see medications section of this packet for any medication changes.   DOCTOR'S APPOINTMENT & FOLLOW UP CARE INSTRUCTIONS  No future appointments.  RETURN PRECAUTIONS:   Take care and be well!  Internal Medicine Teaching Service Inpatient Team [facility]  [facility]  [address] [place], [place] [address] [phone]

## 2019-12-10 NOTE — Progress Notes (Addendum)
Subjective: Patient had no acute overnight events. He continues to have CIWA scores and require Ativan for anxiety and headaches. He overall reports feeling better and ready to go home.    Objective:  Vital signs in last 24 hours: Vitals:   12/09/19 0507 12/09/19 1019 12/09/19 1224 12/09/19 2006  BP: 135/80 (!) 154/73 (!) 145/75 137/66  Pulse: 90 91 72 76  Resp: 18 17  17   Temp: 98 F (36.7 C) 98.3 F (36.8 C) 99.1 F (37.3 C) 98.5 F (36.9 C)  TempSrc: Oral Oral Oral Oral  SpO2: 98% 98% 97% 98%  Weight:      Height:       Physical Exam Constitutional:      Appearance: He is not ill-appearing or toxic-appearing.  HENT:     Head: Normocephalic and atraumatic.  Cardiovascular:     Rate and Rhythm: Normal rate and regular rhythm.     Heart sounds: Normal heart sounds.  Pulmonary:     Effort: Pulmonary effort is normal.     Breath sounds: Normal breath sounds.  Abdominal:     General: Abdomen is flat.     Palpations: Abdomen is soft.     Tenderness: There is no abdominal tenderness.  Skin:    General: Skin is warm and dry.     Comments: No piloerection noted.  Neurological:     Mental Status: He is alert.     Comments: No tremors noted.  Psychiatric:     Comments: Patient is verbose when he talks about the stressors in his life, but remains optimistic     Assessment/Plan:  Principal Problem:   High anion gap metabolic acidosis Active Problems:   Alcohol use disorder   Lactic acidosis Zachary [name] is a 59 y.o. male w/ PMH of alcohol abuse, polysubstance abuse, GI bleed, depression, hypertension, COPD who presented to the ED for an evaluation of abdominal pain and found to have gastritis. Hospital course complicated by elevated lactic acid and white count with no source of infection.   #High Anion Gap Metabolic Acidosis due to Lactic acidosis- resolved AG has closed and LA has resolved. Patient CK was not elevated. Trp 35>29. Patient's initial phos level was 1.0,  but was replenished and increased to 3.5. Patient Mg was WNL. Patient's AG and elevated LA resolved with significant fluid intake suggesting etiology was 2/2 severe dehydration. Patient RUQ suggested follow up in 6 mon. Patient can continue to advance diet and we will discontinue mIVF. It is possible this could be related to benzo withdrawal syndrome, or perhaps a self limited illness that caused profound dehydration.  --Tylenol 650 mg Q6H prn for pain --Zofran 4 mg PO  Q6H prn  #Incidental Liver Lesion on Ultrasound: Repeat liver ultrasound in 6 months for increased echogenicity of hepatic parenchyma suggesting hepatic steatosis and 49mm echogenic focus in R hepatic lobe likely representing hemangioma in the absence of malignancy.      #Alcohol use disorder, hx Patient was sober 1.5 years until this past Saturday when he drank "a fifth of Tequila" in response to significant pain and anxiety, Patient continues to have CIWA scores, with reports of headache, anxiety, tremor, and nausea. Patient required 6 mg Ativan overnight and 16mg  total in the past 24 h. Will restart patient Klonopin 1mg  BID at discharge will recommend that PCP manage a slow taper. Concern that patients exhibiting symptoms of withdrawal from his Klonopin discontinuation. --CIWA w/ ativan protocol --Folic acid 1 mg po daily --IV Thiamine  100 mg daily --Multivitamin 1 tab daily   #HTN BP 137/66-154/73. On Lisinopril-HCTZ 10-12.5 mg at home.  --Control pain --Daily vitals.      #COPD Stable. Satting well on RA. No wheezing or rales on exam. On albuterol as needed at home. --Albuterol 1-2 puffs q6h prn --Pulse Ox  Anxiety Patient has a history of anxiety that was being treated with 1mg  Klonopin TID. Patient reports self tapering about 4-6 weeks ago, but continues to have significant levels of anxiety. He reports having success with Lexapro in the past and does not recall why it was stopped. - Lexapro 10, start - Restart  Klonopin 1mg  BID  #Depression Stable. On trazodone 100 mg at home   #Smoking Smoked about 3 pack/day for 33 years, quite for 8 years and started smoking 1 pack.day 3 years ago. Quit on his own the first time and would like to quit again. --Nicotine patch 21mg  --Outpatient smoking cessation resources  Prior to Admission Living Arrangement: Home Anticipated Discharge Location: Home Barriers to Discharge: Treatment Dispo: Anticipated discharge in approximately 1-2 day(s).   , MD 12/10/2019, 7:37 AM Pager: [phone] After 5pm on weekdays and 1pm on weekends: On Call pager [phone]

## 2019-12-10 NOTE — Discharge Summary (Addendum)
Name: Zachary [name] MRN: [id number] DOB: 1960/08/03 59 y.o. PCP: Patient, No Pcp Per  Date of Admission: 12/08/2019 11:57 AM Date of Discharge: 12/10/2019 Attending Physician: Tyson Alias, *  Discharge Diagnosis: Principal Problem:   High anion gap metabolic acidosis Active Problems:   Alcohol use disorder   Lactic acidosis   Discharge Medications: Allergies as of 12/10/2019       Reactions   Penicillins Other (See Comments)   Childhood allergy Reaction:  Unknown  Has patient had a PCN reaction causing immediate rash, facial/tongue/throat swelling, SOB or lightheadedness with hypotension: Unknown Has patient had a PCN reaction causing severe rash involving mucus membranes or skin necrosis: Unknown Has patient had a PCN reaction that required hospitalization Unknown Has patient had a PCN reaction occurring within the last 10 years: No If all of the above answers are "NO", then may proceed with Cephalosporin use.   Statins Palpitations        Medication List     STOP taking these medications    ibuprofen 600 MG tablet Commonly known as: ADVIL       TAKE these medications    albuterol 108 (90 Base) MCG/ACT inhaler Commonly known as: VENTOLIN HFA Inhale 1-2 puffs into the lungs every 6 (six) hours as needed for wheezing or shortness of breath.   cholecalciferol 25 MCG (1000 UNIT) tablet Commonly known as: VITAMIN D3 Take 1,000 Units by mouth daily.   clonazePAM 1 MG tablet Commonly known as: KlonoPIN Take 1 tablet (1 mg total) by mouth 2 (two) times daily for 15 days. Primary care provider will refill and manage taper.   escitalopram 10 MG tablet Commonly known as: LEXAPRO Take 1 tablet (10 mg total) by mouth daily. Start taking on: December 11, 2019   gabapentin 300 MG capsule Commonly known as: NEURONTIN Take 1 capsule (300 mg total) by mouth 3 (three) times daily.   lisinopril-hydrochlorothiazide 10-12.5 MG tablet Commonly known as:  ZESTORETIC Take 1 tablet by mouth daily.   ondansetron 4 MG tablet Commonly known as: ZOFRAN Take 1 tablet (4 mg total) by mouth every 6 (six) hours as needed for nausea.   pantoprazole 40 MG tablet Commonly known as: PROTONIX Take 1 tablet (40 mg total) by mouth daily.   traZODone 100 MG tablet Commonly known as: DESYREL Take 1 tablet (100 mg total) by mouth at bedtime as needed for sleep.        Disposition and follow-up:   [name] was discharged from [facility] in Stable condition.  At the hospital follow up visit please address:  1. -Please follow up with patient to manage a slow taper of klonopin. Concerned that patient may have been having symptoms of benzodiazepine withdrawal.  - Please reevaluate patient for any negative side effects with Lexapro - Consider reassessing patient anxiety and depression at follow-up. - Please assess patient readiness for smoking cessation - Please reassess patient EtOH use  2.  Labs / imaging needed at time of follow-up: 6 months reevaluate RUQ [place], liver echogenicity.  3.  Pending labs/ test needing follow-up: None  Follow-up Appointments:  Follow-up Information     [facility] Schedule an appointment as soon as possible for a visit.   Specialty: Family Medicine Contact information: [address] [place] [address] [phone]                 Hospital Course by problem list:   High Anion Gap Metabolic Acidosis  due to Lactic acidosis Patient presented with significant abdominal pain with nausea. Patient had tylenol 650 q 6 PRN for pain. Patient received 1 round of vancomycin and cefepime due to concern for infection with elevated LA of 8.3. Patient also had significant leukocytosis. Patient was also treated with fluids. Lactic Acid trended downward. Phos was low at presentation and replenished.There was no clear etiology for infection. CXR found no active cardiopulmonary  disease. CT Abdomen Pelvis found no evidence of acute abnormalities with incidental finding that patient's right testis was noted to be in the inguinal canal.  Therefore abx were held after 1 dose and patient continued to receive fluids and some PO intake. Lactic acid and leukocytosis resolved. RUQ [place] was completed and gallbladder was insignificant but there was an incidental finding regarding patient's liver.  There was increased echogenicity of hepatic parenchyma suggesting hepatic steatosis and 7mm echogenic focus in R hepatic lobe likely representing hemangioma in the absence of malignancy. Patient's diet was advanced to full after RUQ.  GERD Patient abdominal pain was likely 2/2 GERD. Placed on Protonix.    Alcohol use disorder CT Abdomen Pelvis found evidence of hepatic steatosis and a mildly enlarged prostate. Patient reported sobriety for 1.5 years that was broken 2 days prior to presentation due to the pain. Patient also reported that he had recently self-titrated down on his klonopin about 4-6 weeks prior to presentation. Patient was placed on CIWA with Ativan and did require Ativan during hospitalization. Patient was also placed on multivitamins, folate, and thiamine. Concern that patient's presentation may have been 2/2 withdrawal from sobriety and discontinuation of klonopin in combination with severe dehydration. Patient was restarted on Klonopin with decreased frequency at discharge with instructions to taper with the assistance of his PCP. Patient was also started on Lexapro due to his reports of significant anxiety, PMH of anxiety, wanting to discontinue klonopin, and history of use of SSRI in the past. Patient did not report a reason for discontinuation of the SSRI in the past instead reporting that he had had success on it.   HTN HTN was stable once treated for pain and with CIWA.    Tobacco use disorder Smoked about 3 pack/day for 33 years, quite for 8 years and started smoking 1  pack.day 3 years ago. Quit on his own the first time and would like to quit again. Nicotine patch 21mg  provided inpatient.   Discharge Vitals:   BP 137/66 (BP Location: Left Arm)   Pulse 76   Temp 98.5 F (36.9 C) (Oral)   Resp 17   Ht 5\' 11"  (1.803 m)   Wt 81 kg   SpO2 98%   BMI 24.91 kg/m   Pertinent Labs, Studies, and Procedures:   Lactic Acid trend:8.3>6.8>3.3>3.8>1.0  DG Chest 2 View  Result Date: 12/08/2019 CLINICAL DATA:  Sepsis. EXAM: CHEST - 2 VIEW COMPARISON:  October 26, 2019 FINDINGS: The heart size and mediastinal contours are within normal limits. Both lungs are clear. The visualized skeletal structures are unremarkable. IMPRESSION: No active cardiopulmonary disease. Electronically Signed   By: 12/10/2019 M.D.   On: 12/08/2019 17:03   CT ABDOMEN PELVIS W CONTRAST  Result Date: 12/08/2019 CLINICAL DATA:  Generalized abdominal pain, nausea and vomiting. EXAM: CT ABDOMEN AND PELVIS WITH CONTRAST TECHNIQUE: Multidetector CT imaging of the abdomen and pelvis was performed using the standard protocol following bolus administration of intravenous contrast. CONTRAST:  12/10/2019 OMNIPAQUE IOHEXOL 300 MG/ML  SOLN COMPARISON:  None. FINDINGS: Lower chest: No acute  abnormality. Hepatobiliary: No focal liver abnormality is seen. Hepatic steatosis. No gallstones, gallbladder wall thickening, or biliary dilatation. Pancreas: Unremarkable. No pancreatic ductal dilatation or surrounding inflammatory changes. Spleen: Normal in size without focal abnormality. Adrenals/Urinary Tract: Adrenal glands are unremarkable. Kidneys are without renal calculi, focal lesion, or hydronephrosis. Bilateral perinephric fat stranding. Bladder is unremarkable. Stomach/Bowel: Stomach is within normal limits. Appendix appears normal. No evidence of bowel wall thickening, distention, or inflammatory changes. Vascular/Lymphatic: Aortic atherosclerosis. No enlarged abdominal or pelvic lymph nodes. Reproductive: Mild  enlargement of the prostate gland. Right testis is in the inguinal canal. Other: No abdominal wall hernia or abnormality. No abdominopelvic ascites. Diffuse fat stranding of the mesenteric fat. Musculoskeletal: Spondylosis of the lumbosacral spine. IMPRESSION: 1. No evidence of acute abnormalities within the abdomen or pelvis. 2. Hepatic steatosis. 3. Mild enlargement of the prostate gland. Please correlate to serum PSA values. 4. Right testis is in the inguinal canal. Please correlate to patient's symptoms. Aortic Atherosclerosis (ICD10-I70.0). Electronically Signed   By: Ted Mcalpine M.D.   On: 12/08/2019 18:10   US Abdomen Limited RUQ  Result Date: 12/09/2019 CLINICAL DATA:  Abdominal pain. EXAM: ULTRASOUND ABDOMEN LIMITED RIGHT UPPER QUADRANT COMPARISON:  December 08, 2019.  Aug 07, 2016. FINDINGS: Gallbladder: No gallstones or wall thickening visualized. No sonographic Murphy sign noted by sonographer. Common bile duct: Diameter: 6 mm which is within normal limits. Liver: Increased echogenicity of hepatic parenchyma is noted suggesting hepatic steatosis. 8 mm echogenic focus is noted in right hepatic lobe which most likely represents hemangioma in the absence of any history of malignancy. Portal vein is patent on color Doppler imaging with normal direction of blood flow towards the liver. Other: None. IMPRESSION: Increased echogenicity of hepatic parenchyma is noted suggesting hepatic steatosis. 8 mm echogenic focus is noted in right hepatic lobe which most likely represents hemangioma in the absence of any history of malignancy. Follow-up ultrasound in 6 months is recommended to ensure stability. Electronically Signed   By: Lupita Raider M.D.   On: 12/09/2019 15:21    Discharge Instructions: Discharge Instructions     Increase activity slowly   Complete by: As directed      Please speak with your PCP about SLOWLY tapering off of klonopin.   Signed: Bobbye Morton, MD 12/10/2019,  3:58 PM   Pager: [phone]

## 2019-12-13 LAB — CULTURE, BLOOD (ROUTINE X 2)
Culture: NO GROWTH
Culture: NO GROWTH
Special Requests: ADEQUATE

## 2021-02-26 ENCOUNTER — Telehealth (HOSPITAL_COMMUNITY): Payer: Self-pay | Admitting: Emergency Medicine

## 2021-02-26 ENCOUNTER — Ambulatory Visit (HOSPITAL_COMMUNITY)
Admission: EM | Admit: 2021-02-26 | Discharge: 2021-02-26 | Disposition: A | Discharge status: Home / Self Care | Payer: Medicaid Other

## 2021-02-26 ENCOUNTER — Ambulatory Visit (HOSPITAL_COMMUNITY)
Admission: EM | Admit: 2021-02-26 | Discharge: 2021-02-26 | Discharge status: Absent without leave | Payer: Medicaid Other

## 2021-02-26 ENCOUNTER — Encounter (HOSPITAL_COMMUNITY): Payer: Self-pay | Admitting: Emergency Medicine

## 2021-02-26 ENCOUNTER — Other Ambulatory Visit: Payer: Self-pay

## 2021-02-26 DIAGNOSIS — Z760 Encounter for issue of repeat prescription: Secondary | ICD-10-CM

## 2021-02-26 DIAGNOSIS — F419 Anxiety disorder, unspecified: Secondary | ICD-10-CM

## 2021-02-26 MED ORDER — CLONAZEPAM 1 MG PO TABS
1.0000 mg | ORAL_TABLET | Freq: Four times a day (QID) | ORAL | 0 refills | Status: DC | PRN
Start: 2021-02-26 — End: 2021-02-26

## 2021-02-26 MED ORDER — CLONAZEPAM 1 MG PO TABS
1.0000 mg | ORAL_TABLET | Freq: Four times a day (QID) | ORAL | 0 refills | Status: DC | PRN
Start: 2021-02-26 — End: 2021-07-24

## 2021-02-26 MED ORDER — CLONAZEPAM 1 MG PO TABS: 1.0000 mg | ORAL_TABLET | Freq: Four times a day (QID) | ORAL | 0 refills | Status: DC

## 2021-02-26 NOTE — ED Provider Notes (Signed)
[facility]    CSN: [id number] Arrival date & time: 02/26/21  0805      History   Chief Complaint Chief Complaint  Patient presents with   Medication Refill    HPI Zachary [name] is a 60 y.o. male.   Patient presents requesting refill for Klonopin, endorses that he has been taking medication for years due to anxiety.  Endorses that he attempted to reach out to his primary care office but they have been having staffing issues and he has not been able to contact them.  History of alcohol abuse, COPD, depression, prior hypertension.  Past Medical History:  Diagnosis Date   Alcohol abuse    COPD (chronic obstructive pulmonary disease) (HCC)    Depression    Hypertension     Patient Active Problem List   Diagnosis Date Noted   High anion gap metabolic acidosis 12/09/2019   Lactic acidosis 12/09/2019   COPD (chronic obstructive pulmonary disease) (HCC)    Prolonged Q-T interval on ECG    Hypertension 10/28/2015   Alcohol use disorder 10/28/2015   Hyperglycemia 10/28/2015   Tobacco abuse 10/28/2015    Past Surgical History:  Procedure Laterality Date   COLONOSCOPY     MOUTH SURGERY     NO PAST SURGERIES         Home Medications    Prior to Admission medications   Medication Sig Start Date End Date Taking? Authorizing Provider  albuterol (VENTOLIN HFA) 108 (90 Base) MCG/ACT inhaler Inhale 1-2 puffs into the lungs every 6 (six) hours as needed for wheezing or shortness of breath. 07/10/18   Malvin Johns, MD  cholecalciferol (VITAMIN D3) 25 MCG (1000 UT) tablet Take 1,000 Units by mouth daily.    [provider]  clonazePAM (KLONOPIN) 1 MG tablet Take 1 tablet (1 mg total) by mouth 4 (four) times daily as needed. 02/26/21   Jema Deegan, Elita Boone, NP  escitalopram (LEXAPRO) 10 MG tablet Take 1 tablet (10 mg total) by mouth daily. 12/11/19   Bobbye Morton, MD  gabapentin (NEURONTIN) 300 MG capsule Take 1 capsule (300 mg total) by mouth 3 (three) times  daily. 07/10/18   Malvin Johns, MD  lisinopril-hydrochlorothiazide (ZESTORETIC) 10-12.5 MG tablet Take 1 tablet by mouth daily. 05/09/18   [provider]  ondansetron (ZOFRAN) 4 MG tablet Take 1 tablet (4 mg total) by mouth every 6 (six) hours as needed for nausea. 12/10/19   Bobbye Morton, MD  pantoprazole (PROTONIX) 40 MG tablet Take 1 tablet (40 mg total) by mouth daily. 12/10/19   Bobbye Morton, MD  traZODone (DESYREL) 100 MG tablet Take 1 tablet (100 mg total) by mouth at bedtime as needed for sleep. 07/10/18   Malvin Johns, MD    Family History Family History  Problem Relation Age of Onset   Heart attack Paternal Grandfather    Stroke Mother    Heart failure Mother    Heart failure Father     Social History Social History   Tobacco Use   Smoking status: Some Days    Packs/day: 2.00    Types: Cigarettes   Smokeless tobacco: Never  Substance Use Topics   Alcohol use: Yes    Comment: BAC was .23 on admission   Drug use: Yes    Types: Marijuana    Comment: UDS was negative     Allergies   Penicillins and Statins   Review of Systems Review of Systems  Respiratory: Negative.  Cardiovascular: Negative.   Skin: Negative.   Psychiatric/Behavioral: Negative.      Physical Exam Triage Vital Signs ED Triage Vitals  Enc Vitals Group     BP 02/26/21 0830 (!) 158/88     Pulse Rate 02/26/21 0830 93     Resp 02/26/21 0830 18     Temp 02/26/21 0830 98.3 F (36.8 C)     Temp Source 02/26/21 0830 Oral     SpO2 02/26/21 0830 97 %     Weight --      Height --      Head Circumference --      Peak Flow --      Pain Score 02/26/21 0827 0     Pain Loc --      Pain Edu? --      Excl. in GC? --    No data found.  Updated Vital Signs BP (!) 158/88 (BP Location: Left Arm)    Pulse 93    Temp 98.3 F (36.8 C) (Oral)    Resp 18    SpO2 97%   Visual Acuity Right Eye Distance:   Left Eye Distance:   Bilateral Distance:    Right Eye Near:   Left Eye Near:     Bilateral Near:     Physical Exam Constitutional:      Appearance: Normal appearance. He is normal weight.  Eyes:     Extraocular Movements: Extraocular movements intact.  Pulmonary:     Effort: Pulmonary effort is normal.  Skin:    General: Skin is warm and dry.  Neurological:     Mental Status: He is alert and oriented to person, place, and time. Mental status is at baseline.  Psychiatric:        Mood and Affect: Mood normal.        Behavior: Behavior normal.     UC Treatments / Results  Labs (all labs ordered are listed, but only abnormal results are displayed) Labs Reviewed - No data to display  EKG   Radiology No results found.  Procedures Procedures (including critical care time)  Medications Ordered in UC Medications - No data to display  Initial Impression / Assessment and Plan / UC Course  I have reviewed the triage vital signs and the nursing notes.  Pertinent labs & imaging results that were available during my care of the patient were reviewed by me and considered in my medical decision making (see chart for details).  Anxiety Encounter for medication refill  Klonopin has been refilled for 1 month, PCP referral has been placed for this patient to reestablish care as he endorses that his primary care office is in the process of closing, PDMP reviewed, patient has been getting medicine monthly as he endorsed, patient is calm and cooperative during exam, does not appear to be anxious or in signs of distress at this time Final Clinical Impressions(s) / UC Diagnoses   Final diagnoses:  Anxiety  Encounter for medication refill     Discharge Instructions      Continue to take your medication as prescribed  A primary care referral has been placed for you to help you to find a new doctor  There is a number highlighted in your packet that you may call as well to help you find a new doctor   ED Prescriptions     Medication Sig Dispense Auth.  Provider   clonazePAM (KLONOPIN) 1 MG tablet Take 1 tablet (1 mg total) by mouth 4 (  four) times daily as needed. 30 tablet Emogene Muratalla, Elita Boone, NP      I have reviewed the PDMP during this encounter.   Valinda Hoar, [place] 02/26/21 [phone]

## 2021-02-26 NOTE — ED Triage Notes (Signed)
Pt reports that needing refill on clonazepam 1mg  that takes 4 times a day. Called PCP but not getting response.

## 2021-02-26 NOTE — Discharge Instructions (Signed)
Continue to take your medication as prescribed  A primary care referral has been placed for you to help you to find a new doctor  There is a number highlighted in your packet that you may call as well to help you find a new doctor

## 2021-07-19 ENCOUNTER — Other Ambulatory Visit: Payer: Self-pay

## 2021-07-19 ENCOUNTER — Emergency Department (HOSPITAL_COMMUNITY): Payer: Medicaid Other

## 2021-07-19 ENCOUNTER — Emergency Department (HOSPITAL_COMMUNITY)
Admission: EM | Admit: 2021-07-19 | Discharge: 2021-07-22 | Disposition: A | Discharge status: Other Acute Inpatient Hospital | Payer: Medicaid Other | Attending: Emergency Medicine | Admitting: Emergency Medicine

## 2021-07-19 DIAGNOSIS — X788XXA Intentional self-harm by other sharp object, initial encounter: Secondary | ICD-10-CM | POA: Diagnosis not present

## 2021-07-19 DIAGNOSIS — F1010 Alcohol abuse, uncomplicated: Secondary | ICD-10-CM | POA: Insufficient documentation

## 2021-07-19 DIAGNOSIS — F1014 Alcohol abuse with alcohol-induced mood disorder: Secondary | ICD-10-CM | POA: Insufficient documentation

## 2021-07-19 DIAGNOSIS — F411 Generalized anxiety disorder: Secondary | ICD-10-CM | POA: Diagnosis not present

## 2021-07-19 DIAGNOSIS — T1491XA Suicide attempt, initial encounter: Secondary | ICD-10-CM

## 2021-07-19 DIAGNOSIS — S61512A Laceration without foreign body of left wrist, initial encounter: Secondary | ICD-10-CM | POA: Insufficient documentation

## 2021-07-19 DIAGNOSIS — Y901 Blood alcohol level of 20-39 mg/100 ml: Secondary | ICD-10-CM | POA: Diagnosis not present

## 2021-07-19 DIAGNOSIS — F1094 Alcohol use, unspecified with alcohol-induced mood disorder: Secondary | ICD-10-CM | POA: Diagnosis present

## 2021-07-19 DIAGNOSIS — Z20822 Contact with and (suspected) exposure to covid-19: Secondary | ICD-10-CM | POA: Diagnosis not present

## 2021-07-19 DIAGNOSIS — Z79899 Other long term (current) drug therapy: Secondary | ICD-10-CM | POA: Diagnosis not present

## 2021-07-19 DIAGNOSIS — J449 Chronic obstructive pulmonary disease, unspecified: Secondary | ICD-10-CM | POA: Insufficient documentation

## 2021-07-19 DIAGNOSIS — I10 Essential (primary) hypertension: Secondary | ICD-10-CM | POA: Insufficient documentation

## 2021-07-19 DIAGNOSIS — F332 Major depressive disorder, recurrent severe without psychotic features: Secondary | ICD-10-CM | POA: Insufficient documentation

## 2021-07-19 DIAGNOSIS — F331 Major depressive disorder, recurrent, moderate: Secondary | ICD-10-CM

## 2021-07-19 DIAGNOSIS — R45851 Suicidal ideations: Secondary | ICD-10-CM | POA: Insufficient documentation

## 2021-07-19 DIAGNOSIS — F1090 Alcohol use, unspecified, uncomplicated: Secondary | ICD-10-CM

## 2021-07-19 DIAGNOSIS — X789XXA Intentional self-harm by unspecified sharp object, initial encounter: Secondary | ICD-10-CM | POA: Diagnosis present

## 2021-07-19 DIAGNOSIS — S6992XA Unspecified injury of left wrist, hand and finger(s), initial encounter: Secondary | ICD-10-CM | POA: Diagnosis present

## 2021-07-19 LAB — CBC WITH DIFFERENTIAL/PLATELET
Abs Immature Granulocytes: 0.14 10*3/uL — ABNORMAL HIGH (ref 0.00–0.07)
Basophils Absolute: 0.1 10*3/uL (ref 0.0–0.1)
Basophils Relative: 1 %
Eosinophils Absolute: 0.1 10*3/uL (ref 0.0–0.5)
Eosinophils Relative: 1 %
HCT: 38.2 % — ABNORMAL LOW (ref 39.0–52.0)
Hemoglobin: 13.7 g/dL (ref 13.0–17.0)
Immature Granulocytes: 2 %
Lymphocytes Relative: 23 %
Lymphs Abs: 2 10*3/uL (ref 0.7–4.0)
MCH: 32.9 pg (ref 26.0–34.0)
MCHC: 35.9 g/dL (ref 30.0–36.0)
MCV: 91.8 fL (ref 80.0–100.0)
Monocytes Absolute: 1.3 10*3/uL — ABNORMAL HIGH (ref 0.1–1.0)
Monocytes Relative: 15 %
Neutro Abs: 5.2 10*3/uL (ref 1.7–7.7)
Neutrophils Relative %: 58 %
Platelets: 164 10*3/uL (ref 150–400)
RBC: 4.16 MIL/uL — ABNORMAL LOW (ref 4.22–5.81)
RDW: 12.3 % (ref 11.5–15.5)
WBC: 8.8 10*3/uL (ref 4.0–10.5)
nRBC: 0 % (ref 0.0–0.2)

## 2021-07-19 LAB — COMPREHENSIVE METABOLIC PANEL
ALT: 28 U/L (ref 0–44)
AST: 38 U/L (ref 15–41)
Albumin: 3.7 g/dL (ref 3.5–5.0)
Alkaline Phosphatase: 80 U/L (ref 38–126)
Anion gap: 9 (ref 5–15)
BUN: 7 mg/dL — ABNORMAL LOW (ref 8–23)
CO2: 26 mmol/L (ref 22–32)
Calcium: 9.1 mg/dL (ref 8.9–10.3)
Chloride: 104 mmol/L (ref 98–111)
Creatinine, Ser: 0.84 mg/dL (ref 0.61–1.24)
GFR, Estimated: >60 mL/min (ref >60)
Glucose, Bld: 86 mg/dL (ref 70–99)
Potassium: 3.9 mmol/L (ref 3.5–5.1)
Sodium: 139 mmol/L (ref 135–145)
Total Bilirubin: 1.1 mg/dL (ref 0.3–1.2)
Total Protein: 6.1 g/dL — ABNORMAL LOW (ref 6.5–8.1)

## 2021-07-19 LAB — RAPID URINE DRUG SCREEN, HOSP PERFORMED
Amphetamines: NOT DETECTED
Barbiturates: NOT DETECTED
Benzodiazepines: POSITIVE — AB
Cocaine: NOT DETECTED
Opiates: NOT DETECTED
Tetrahydrocannabinol: NOT DETECTED

## 2021-07-19 LAB — RESP PANEL BY RT-PCR (FLU A&B, COVID) ARPGX2
Influenza A by PCR: NEGATIVE
Influenza B by PCR: NEGATIVE
SARS Coronavirus 2 by RT PCR: NEGATIVE

## 2021-07-19 LAB — ETHANOL: Alcohol, Ethyl (B): 33 mg/dL — ABNORMAL HIGH (ref <10)

## 2021-07-19 MED ORDER — LISINOPRIL 10 MG PO TABS
10.0000 mg | ORAL_TABLET | Freq: Every day | ORAL | Status: DC
  Administered 2021-07-20: 10 mg via ORAL
  Filled 2021-07-19: qty 1

## 2021-07-19 MED ORDER — PANTOPRAZOLE SODIUM 40 MG PO TBEC
40.0000 mg | DELAYED_RELEASE_TABLET | Freq: Every day | ORAL | Status: DC
  Administered 2021-07-20 – 2021-07-21 (×2): 40 mg via ORAL
  Filled 2021-07-19 (×2): qty 1

## 2021-07-19 MED ORDER — LIDOCAINE-EPINEPHRINE 2 %-1:200000 IJ SOLN
10.0000 mL | Freq: Once | INTRAMUSCULAR | Status: AC
Start: 2021-07-19 — End: 2021-07-19
  Administered 2021-07-19: 10 mL
  Filled 2021-07-19: qty 20

## 2021-07-19 MED ORDER — LORAZEPAM 1 MG PO TABS
0.0000 mg | ORAL_TABLET | Freq: Four times a day (QID) | ORAL | Status: AC
  Administered 2021-07-19 – 2021-07-21 (×5): 1 mg via ORAL
  Filled 2021-07-19 (×5): qty 1

## 2021-07-19 MED ORDER — LISINOPRIL-HYDROCHLOROTHIAZIDE 10-12.5 MG PO TABS: 1.0000 | ORAL_TABLET | Freq: Every day | ORAL | Status: DC

## 2021-07-19 MED ORDER — THIAMINE HCL 100 MG/ML IJ SOLN: 100.0000 mg | Freq: Every day | INTRAMUSCULAR | Status: DC

## 2021-07-19 MED ORDER — LORAZEPAM 2 MG/ML IJ SOLN: 0.0000 mg | Freq: Two times a day (BID) | INTRAMUSCULAR | Status: DC

## 2021-07-19 MED ORDER — ALBUTEROL SULFATE HFA 108 (90 BASE) MCG/ACT IN AERS: 1.0000 | INHALATION_SPRAY | Freq: Four times a day (QID) | RESPIRATORY_TRACT | Status: DC | PRN

## 2021-07-19 MED ORDER — CLONAZEPAM 0.5 MG PO TABS
1.0000 mg | ORAL_TABLET | Freq: Four times a day (QID) | ORAL | Status: DC | PRN
  Administered 2021-07-20 – 2021-07-21 (×3): 1 mg via ORAL
  Filled 2021-07-19 (×3): qty 2

## 2021-07-19 MED ORDER — GABAPENTIN 300 MG PO CAPS
300.0000 mg | ORAL_CAPSULE | Freq: Three times a day (TID) | ORAL | Status: DC
  Administered 2021-07-19 – 2021-07-21 (×7): 300 mg via ORAL
  Filled 2021-07-19 (×7): qty 1

## 2021-07-19 MED ORDER — HYDROCHLOROTHIAZIDE 12.5 MG PO TABS
12.5000 mg | ORAL_TABLET | Freq: Every day | ORAL | Status: DC
Start: 2021-07-20 — End: 2021-07-22
  Administered 2021-07-20: 12.5 mg via ORAL
  Filled 2021-07-19: qty 1

## 2021-07-19 MED ORDER — ONDANSETRON HCL 4 MG PO TABS: 4.0000 mg | ORAL_TABLET | Freq: Four times a day (QID) | ORAL | Status: DC | PRN

## 2021-07-19 MED ORDER — ALBUTEROL SULFATE (2.5 MG/3ML) 0.083% IN NEBU: 2.5000 mg | INHALATION_SOLUTION | Freq: Four times a day (QID) | RESPIRATORY_TRACT | Status: DC | PRN

## 2021-07-19 MED ORDER — THIAMINE HCL 100 MG PO TABS
100.0000 mg | ORAL_TABLET | Freq: Every day | ORAL | Status: DC
  Administered 2021-07-19 – 2021-07-21 (×3): 100 mg via ORAL
  Filled 2021-07-19 (×3): qty 1

## 2021-07-19 MED ORDER — LORAZEPAM 2 MG/ML IJ SOLN: 0.0000 mg | Freq: Four times a day (QID) | INTRAMUSCULAR | Status: AC

## 2021-07-19 MED ORDER — LORAZEPAM 1 MG PO TABS: 0.0000 mg | ORAL_TABLET | Freq: Two times a day (BID) | ORAL | Status: DC

## 2021-07-19 NOTE — ED Notes (Signed)
Pt's belongings removed and pt now in purple scrubs. Pt was wanded by security.  ?

## 2021-07-19 NOTE — ED Provider Notes (Signed)
Patient here after suicide attempt.  Shared resident visit.  Patient with history of alcohol abuse, anxiety, depression.  Patient cut his left forearm with a razor blade and attempt to kill himself.  Continues to endorse that he does not want to live anymore.  Vital signs are normal.  Medical clearance labs including CBC, CMP, alcohol level ordered.  Per my review and interpretation these are unremarkable.  Patient placed on CIWA protocol.  Home medications ordered.  IVC was placed.  Pending TTS.  Suture was repaired by resident.  X-rays of his hand and wrist per my review and interpretation showed no acute injury. ?  Virgina Norfolk, DO ?07/19/21 2251 ? ?

## 2021-07-19 NOTE — ED Triage Notes (Signed)
Pt BIB EMS from home after cutting L forearm with razor blade, bleeding controlled. ETOH onboard. Pt stated he doesn't want to live anymore since his dad is sick and girlfriend just broke up with him. A&Ox4 ?HX- SI attempt in 2020, COPD ?

## 2021-07-19 NOTE — ED Notes (Signed)
IVC papers served, 3 copies given to Gean Birchwood, RN, copy placed in medical records drawer, and original in red folder.  ?

## 2021-07-19 NOTE — ED Notes (Signed)
Pt talking with TTS  

## 2021-07-19 NOTE — BH Assessment (Signed)
Comprehensive Clinical Assessment (CCA) Note ? ?07/19/2021 ?Zachary [name] ?[id number] ? ?DISPOSITION: Gave clinical report to Zachary ConnJason Berry, FNP who determined Pt meets criteria for inpatient psychiatric treatment. Facilities will be contacted for placement. Notified Dr Zachary [name] and Zachary CrewsAlison Dechambeau, RN of recommendation via secure message. ? ?The patient demonstrates the following risk factors for suicide: Chronic risk factors for suicide include: psychiatric disorder of major depressive disorder, GAD, substance use disorder, previous suicide attempts by cutting his wrist, and demographic factors (male, 20>60 y/o). Acute risk factors for suicide include: family or marital conflict, social withdrawal/isolation, and loss (financial, interpersonal, professional). Protective factors for this patient include: positive therapeutic relationship and religious beliefs against suicide. Considering these factors, the overall suicide risk at this point appears to be high. Patient is not appropriate for outpatient follow up. ? ?[facility]  ?C-SSRS RISK CATEGORY High Risk No Risk No Risk  ? ?  ? ?Patient is a 61 year old single male who [facility]ac[facility] via EMS after cutting his left forearm with a knife in a suicide attempt. He is reporting symptoms of depression, anxiety, suicidal ideation, and alcohol abuse. Patient reports he has a history of severe anxiety and ran out of his prescribed clonazepam in January of this year. He says he has a history of alcohol abuse and had been sober for three years. He explains he thought he could manage without clonazepam but his anxiety became more severe. He says at times his anxiety leaves him ?paralyzed? and unable to make  decisions or drive his car. He states last week his father's wife fell and was seriously injured and his supervisor at work threatened to leave the company. He says he felt overwhelmed and relapsed on alcohol on May 4th. He says since relapsing he has been drinking continuously including entire bottles of wine, tequila, and Listerine. He reports he has experienced blackouts this past weekend, describing waking up in his car and not remembering how he got there. Patient states he does not want to live anymore. While [facility] night, with a sitter in the room, he attempted suicide by cutting his wrist with a metal bracelet. He reports he has attempted suicide twice in the past by cutting his wrist.  ? ?Yesterday, inpatient psychiatric treatment was recommended. Today, Patient was evaluated by psychiatry and cleared for discharge. He says he went to a pharmacy to refill his clonazepam but they would not fill a partial prescription. He says he went home and drank 6 cans of hard cider. He called his girlfriend and she told him she was ending their relationship. He says he cut his forearm with a knife in an attempt to kill himself "but the blood was clotting and I knew it would not work." He says he called 911. Laceration required 10 sutures. ? ?Patient describes his mood as depressed and anxious and acknowledges symptoms including crying spells, decreased concentration, racing thoughts, and feelings of hopelessness. He says he is sleeping 4-5 hours per night. He denies homicidal ideation or history of aggression. He denies experiencing auditory or visual hallucinations. Patient says in the past when withdrawing from alcohol he has experienced anxiety, tremors, sweats, nausea, and vomiting. He denies history of seizures. He denies use of substances other than alcohol. ? ?  Patient reports that he lives alone and works in Therapist, sports. He says he has three adult children and  identifies his sister as his primary support. He denies current legal problems. He says he has no current mental health providers and is not on any medications. He reports receiving substance use treatment three years ago at [facility] in [place]. His most [facility] to [facility] was in April 2020. ? ?Pt is dressed in hospital scrubs, alert and oriented x4. Pt speaks in a clear tone, at moderate volume and normal pace. Motor behavior appears normal. Eye contact is fair. Pt's mood is depressed and anxious, affect is congruent with mood. Thought process is coherent and relevant. There is no indication Pt is currently responding to internal stimuli or experiencing delusional thought content. He is cooperative and says he is willing to sign voluntarily into a psychiatric facility. ? ? ?Chief Complaint:  ?Chief Complaint  ?Patient presents with  ? Suicide Attempt  ? ?Visit Diagnosis:  ?F33.2 Major depressive disorder, Recurrent episode, Severe ?F41.1 Generalized anxiety disorder ?F10.20 Alcohol use disorder, Severe ? ? ?CCA Screening, Triage and Referral (STR) ? ?Patient Reported Information ?How did you hear about [place]? Self ? ?What Is the Reason for Your Visit/Call Today? Pt has a history of depression, anxiety, and alcohol abuse. He stopped taking clonazepam in January and recently has been more anxious and depressed. He relapsed on alcohol on 07/15/2021 and has been drinking large quantites daily. Today he cut his forearm with a knife, requiring 10 sutures. He continuous to express suicidal ideation. ? ?How Long Has This Been Causing You Problems? 1 wk - 1 month ? ?What Do You Feel Would Help You the Most Today? Alcohol or Drug Use Treatment; Treatment for Depression or other mood problem; Medication(s) ? ? ?Have You Recently Had Any Thoughts About Hurting Yourself? Yes ? ?Are You Planning to Commit Suicide/Harm Yourself At This time? Yes ? ? ?Have you Recently Had Thoughts About Hurting Someone Karolee Ohs?  No ? ?Are You Planning to Harm Someone at This Time? No ? ?Explanation: No data recorded ? ?Have You Used Any Alcohol or Drugs in the Past 24 Hours? Yes ? ?How Long Ago Did You Use Drugs or Alcohol? No data recorded ?What Did You Use and How Much? Today drank 6 cans of hard cider. ? ? ?Do You Currently Have a Therapist/Psychiatrist? No ? ?Name of Therapist/Psychiatrist: No data recorded ? ?Have You Been Recently Discharged From Any Office Practice or Programs? Yes ? ?Explanation of Discharge From Practice/Program: Pt was discharged from [facility] ED yesterday after cutting his wrists in a suicide attempt. ? ? ?  ?CCA Screening Triage Referral Assessment ?Type of Contact: Tele-Assessment ? ?Telemedicine Service Delivery: Telemedicine service delivery: This service was provided via telemedicine using a 2-way, interactive audio and video technology ? ?Is this Initial or Reassessment? Initial Assessment ? ?Date Telepsych consult ordered in [facility]:  07/19/21 ? ?Time Telepsych consult ordered in [facility]:  2031 ? ?[facility] ? ?Provider Location: [facility] Assessment Services ? ? ?Collateral Involvement: Medical record ? ? ?Does Patient Have a Automotive engineer Guardian? No data recorded ?Name and Contact of Legal Guardian: No data recorded ?If Minor and Not Living with Parent(s), Who has Custody? NA ? ?Is CPS involved or ever been involved? Never ? ?Is APS involved or ever been involved? Never ? ? ?Patient Determined To Be At Risk for Harm To Self or Others Based on Review of  Patient Reported Information or Presenting Complaint? Yes, for Self-Harm ? ?Method: No data recorded ?Availability of Means: No data recorded ?Intent: No data recorded ?Notification Required: No data recorded ?Additional Information for Danger to Others Potential: No data recorded ?Additional Comments for Danger to Others Potential: No data recorded ?Are There Guns or Other Weapons in Your Home? No data recorded ?Types of  Guns/Weapons: No data recorded ?Are These Weapons Safely Secured?                            No data recorded ?Who Could Verify You Are Able To Have These Secured: No data recorded ?Do You Have any Building surveyor

## 2021-07-19 NOTE — ED Notes (Signed)
XR at bedside

## 2021-07-19 NOTE — ED Provider Notes (Signed)
?[facility] ?Provider Note ? ? ?CSN: [id number] ?Arrival date & time: 07/19/21  1920 ? ?  ? ?History ? ?Chief Complaint  ?Patient presents with  ? Suicide Attempt  ? ? ?Zachary [name] is a 61 y.o. male. ? ?HPI ?[age] year old male with a history of depression, HTN, COPD, alcohol abuse presented to the ED after a suicide attempt.  Patient states that he has had multiple recent stressors including heavy drinking, his father being sick, and the fact that his girlfriend just broke up with him.  He states that he was seen at an outside ED yesterday and evaluated via telepsych and discharged.  Today, he continued to have worsening suicidal ideation and attempted suicide by inflicting a laceration to his left wrist with a razor.  He continues to endorse suicidal ideation in the ED.  Denies any HI or AVH.  States he has been drinking multiple bottles of wine, liquor, and Listerine over the past 2 days.  He does endorse some tenderness over the left hand due to an injury he reports he sustained yesterday. ? ?  ? ?Home Medications ?Prior to Admission medications   ?Medication Sig Start Date End Date Taking? Authorizing Provider  ?albuterol (VENTOLIN HFA) 108 (90 Base) MCG/ACT inhaler Inhale 1-2 puffs into the lungs every 6 (six) hours as needed for wheezing or shortness of breath. 07/10/18   Malvin JohnsFarah, Brian, MD  ?cholecalciferol (VITAMIN D3) 25 MCG (1000 UT) tablet Take 1,000 Units by mouth daily.    [provider]  ?clonazePAM (KLONOPIN) 1 MG tablet Take 1 tablet (1 mg total) by mouth 4 (four) times daily as needed. 02/26/21   Valinda HoarWhite, Adrienne R, NP  ?clonazePAM (KLONOPIN) 1 MG tablet Take 1 tablet (1 mg total) by mouth in the morning, at noon, in the evening, and at bedtime. 02/26/21   Valinda HoarWhite, Adrienne R, NP  ?escitalopram (LEXAPRO) 10 MG tablet Take 1 tablet (10 mg total) by mouth daily. 12/11/19   Bobbye MortonMcQuilla, Jai B, MD  ?gabapentin (NEURONTIN) 300 MG capsule Take 1 capsule (300 mg  total) by mouth 3 (three) times daily. 07/10/18   Malvin JohnsFarah, Brian, MD  ?lisinopril-hydrochlorothiazide (ZESTORETIC) 10-12.5 MG tablet Take 1 tablet by mouth daily. 05/09/18   [provider]  ?ondansetron (ZOFRAN) 4 MG tablet Take 1 tablet (4 mg total) by mouth every 6 (six) hours as needed for nausea. 12/10/19   Bobbye MortonMcQuilla, Jai B, MD  ?pantoprazole (PROTONIX) 40 MG tablet Take 1 tablet (40 mg total) by mouth daily. 12/10/19   Bobbye MortonMcQuilla, Jai B, MD  ?traZODone (DESYREL) 100 MG tablet Take 1 tablet (100 mg total) by mouth at bedtime as needed for sleep. 07/10/18   Malvin JohnsFarah, Brian, MD  ?   ? ?Allergies    ?Penicillins and Statins   ? ?Review of Systems   ?Review of Systems  ?Psychiatric/Behavioral:  Positive for suicidal ideas. Negative for hallucinations.   ? ?Physical Exam ?Updated Vital Signs ?BP (!) 157/96   Pulse 83   Temp 98 ?F (36.7 ?C) (Oral)   Resp 15   SpO2 95%  ?Physical Exam ?Constitutional:   ?   Appearance: He is not toxic-appearing or diaphoretic.  ?   Comments: Chronically ill-appearing.  ?HENT:  ?   Head: Normocephalic and atraumatic.  ?   Nose: Nose normal.  ?Eyes:  ?   General: No scleral icterus. ?Cardiovascular:  ?   Pulses: Normal pulses.  ?   Comments: 2+ radial pulses bilaterally. ?Pulmonary:  ?  Effort: Pulmonary effort is normal. No respiratory distress.  ?Abdominal:  ?   Tenderness: There is no abdominal tenderness. There is no guarding or rebound.  ?Musculoskeletal:  ?   Comments: Mild swelling and tenderness over the dorsal aspect of the left hand.  He has full range of motion of all fingers of the left hand as well as full range of motion of the left wrist.  ?Skin: ?   General: Skin is warm and dry.  ?   Comments: 6 cm laceration over the ventral aspect of the left wrist.  Bleeding is controlled on my evaluation.  Tendon is visible, but intact and does not appear injured.  ?Neurological:  ?   General: No focal deficit present.  ? ? ?ED Results / Procedures / Treatments   ?Labs ?(all labs  ordered are listed, but only abnormal results are displayed) ?Labs Reviewed  ?COMPREHENSIVE METABOLIC PANEL - Abnormal; Notable for the following components:  ?    Result Value  ? BUN 7 (*)   ? Total Protein 6.1 (*)   ? All other components within normal limits  ?ETHANOL - Abnormal; Notable for the following components:  ? Alcohol, Ethyl (B) 33 (*)   ? All other components within normal limits  ?RAPID URINE DRUG SCREEN, HOSP PERFORMED - Abnormal; Notable for the following components:  ? Benzodiazepines POSITIVE (*)   ? All other components within normal limits  ?CBC WITH DIFFERENTIAL/PLATELET - Abnormal; Notable for the following components:  ? RBC 4.16 (*)   ? HCT 38.2 (*)   ? Monocytes Absolute 1.3 (*)   ? Abs Immature Granulocytes 0.14 (*)   ? All other components within normal limits  ?RESP PANEL BY RT-PCR (FLU A&B, COVID) [id number]  ? ? ?EKG ?EKG Interpretation ? ?Date/Time:  Monday Jul 19 2021 19:21:15 EDT ?Ventricular Rate:  83 ?PR Interval:  144 ?QRS Duration: 113 ?QT Interval:  393 ?QTC Calculation: 462 ?R Axis:   50 ?Text Interpretation: Sinus rhythm Incomplete right bundle branch block Confirmed by Lockie Mola, Adam ([id number]) on 07/19/2021 10:47:59 PM ? ?Radiology ?DG Wrist Complete Left ? ?Result Date: 07/19/2021 ?CLINICAL DATA:  Cut forearm with razor blade. EXAM: LEFT WRIST - COMPLETE 3+ VIEW COMPARISON:  None Available. FINDINGS: Advanced degenerative changes at the 1st carpometacarpal joint and mild degenerative changes at the radiocarpal joint. No acute bony abnormality. Specifically, no fracture, subluxation, or dislocation. No radiopaque foreign body. IMPRESSION: No fracture or foreign body. Electronically Signed   By: Charlett Nose M.D.   On: 07/19/2021 20:34  ? ?DG Hand 2 View Left ? ?Result Date: 07/19/2021 ?CLINICAL DATA:  Hand pain, CT left forearm with razor blade. EXAM: LEFT HAND - 2 VIEW COMPARISON:  02/21/2006. FINDINGS: There is no evidence of fracture or dislocation. Degenerative changes are present at  the interphalangeal joints, first carpometacarpal joint, first and third metacarpal phalangeal joints and wrist. Mild soft tissue swelling is present over the dorsum of the hand metacarpal phalangeal joints. No radiopaque foreign body is seen. IMPRESSION: No acute fracture or dislocation. Electronically Signed   By: Thornell Sartorius M.D.   On: 07/19/2021 20:36   ? ?Procedures ?Marland Kitchen.Laceration Repair ? ?Date/Time: 07/19/2021 10:47 PM ?Performed by: Laurence Compton, MD ?Authorized by: Virgina Norfolk, DO  ? ?Consent:  ?  Consent obtained:  Verbal ?  Consent given by:  Patient ?  Risks, benefits, and alternatives were discussed: yes   ?  Risks discussed:  Infection, pain and need for additional repair ?  Alternatives  discussed:  No treatment ?Universal protocol:  ?  Patient identity confirmed:  Verbally with patient ?Anesthesia:  ?  Anesthesia method:  Local infiltration ?  Local anesthetic:  Lidocaine 1% WITH epi ?Laceration details:  ?  Location:  Shoulder/arm ?  Shoulder/arm location:  L lower arm ?  Length (cm):  6 ?Pre-procedure details:  ?  Preparation:  Patient was prepped and draped in usual sterile fashion and imaging obtained to evaluate for foreign bodies ?Treatment:  ?  Area cleansed with:  Saline ?  Amount of cleaning:  Standard ?  Irrigation solution:  Sterile saline ?  Irrigation method:  Syringe ?  Visualized foreign bodies/material removed: no   ?  Debridement:  None ?  Undermining:  None ?Skin repair:  ?  Repair method:  Sutures ?  Suture size:  4-0 ?  Suture material:  Prolene ?  Suture technique:  Simple interrupted ?  Number of sutures:  10 ?Approximation:  ?  Approximation:  Close ?Repair type:  ?  Repair type:  Simple ?Post-procedure details:  ?  Dressing:  Non-adherent dressing ?  Procedure completion:  Tolerated  ? ? ?Medications Ordered in ED ?Medications  ?LORazepam (ATIVAN) injection 0-4 mg ( Intravenous See Alternative 07/19/21 2128)  ?  Or  ?LORazepam (ATIVAN) tablet 0-4 mg (1 mg Oral Given 07/19/21  2128)  ?LORazepam (ATIVAN) injection 0-4 mg (has no administration in time range)  ?  Or  ?LORazepam (ATIVAN) tablet 0-4 mg (has no administration in time range)  ?thiamine tablet 100 mg (100 mg Oral Given 07/19/21

## 2021-07-20 MED ORDER — ACETAMINOPHEN 500 MG PO TABS
1000.0000 mg | ORAL_TABLET | Freq: Four times a day (QID) | ORAL | Status: DC | PRN
  Administered 2021-07-20: 1000 mg via ORAL
  Filled 2021-07-20: qty 2

## 2021-07-20 MED ORDER — IBUPROFEN 400 MG PO TABS
600.0000 mg | ORAL_TABLET | Freq: Four times a day (QID) | ORAL | Status: DC | PRN
  Administered 2021-07-20: 600 mg via ORAL
  Filled 2021-07-20: qty 1

## 2021-07-20 MED ORDER — ZOLPIDEM TARTRATE 5 MG PO TABS
10.0000 mg | ORAL_TABLET | Freq: Every evening | ORAL | Status: DC | PRN
  Administered 2021-07-20 – 2021-07-21 (×3): 10 mg via ORAL
  Filled 2021-07-20 (×3): qty 2

## 2021-07-20 NOTE — ED Notes (Signed)
Pt allowed a pastor friend, Aram Beecham, to visit for a little bit. This pastor friend has an Educational psychologist and would love to take the pt in.  I explained that placement is facilitated by Social Work and [facility] and that the pt would most likely have to be reevaluated via TTS before considering if placement with this ministry would be appropriate.   ? ?The ministry is called "[employer]".   ?Executive Director Aram Beecham.   ?Phone: [phone] ?[address] [place], [place] [address] ?Email: [email or web address] ?

## 2021-07-20 NOTE — ED Notes (Addendum)
Pt used the restroom and is now using phone for the first time today as far as I know.  Pt was allowed to use cell phone long enough to get some numbers to make calls.  Cell phone being locked up with security after phone calls along with a phone charger and a blue metal box containing ID and some cards. ?

## 2021-07-20 NOTE — ED Notes (Addendum)
Pt awake given apple sauce, pt requesting meds for anxiety. [place] RN made aware. ?

## 2021-07-20 NOTE — ED Notes (Signed)
Sitter not available per charge nurse ?

## 2021-07-20 NOTE — ED Notes (Signed)
Breakfast order placed ?

## 2021-07-20 NOTE — ED Notes (Signed)
Pharmacy tech at bedside 

## 2021-07-20 NOTE — ED Notes (Signed)
Pt has not endorsed SI during my shift.  Continuing High risk precautions until cleared by [facility]. ?

## 2021-07-20 NOTE — ED Notes (Signed)
Pt requested bible. 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  Bible provided.  ?

## 2021-07-20 NOTE — ED Notes (Signed)
Pt received breakfast tray 

## 2021-07-20 NOTE — ED Provider Notes (Signed)
Emergency Medicine Observation Re-evaluation Note ? ?Zachary [name] is a 61 y.o. male, seen on rounds today.  Pt initially presented to the ED for complaints of Suicide Attempt ?Currently, the patient is resting comfortably on the bed. ? ?Physical Exam  ?BP 112/61 (BP Location: Right Arm)   Pulse 65   Temp 98.6 ?F (37 ?C) (Oral)   Resp 18   SpO2 97%  ?Physical Exam ?General: No acute distress ?Cardiac: Regular rate ?Lungs: No respiratory distress ?Psych: Calm ? ?ED Course / MDM  ?EKG:EKG Interpretation ? ?Date/Time:  Monday Jul 19 2021 19:21:15 EDT ?Ventricular Rate:  83 ?PR Interval:  144 ?QRS Duration: 113 ?QT Interval:  393 ?QTC Calculation: 462 ?R Axis:   50 ?Text Interpretation: Sinus rhythm Incomplete right bundle branch block Confirmed by Lockie Mola, Adam ([id number]) on 07/19/2021 10:47:59 PM ? ?I have reviewed the labs performed to date as well as medications administered while in observation.  Recent changes in the last 24 hours include no acute changes.  Patient was assessed by psychiatry last night, they recommended inpatient psychiatry admission. ? ?Plan  ?Current plan is for patient to be managed in the ED while they await placement. ?Zachary [name] is under involuntary commitment. ?  ? ?  ?Derwood Kaplan, MD ?07/20/21 1009 ? ?

## 2021-07-21 ENCOUNTER — Encounter (HOSPITAL_COMMUNITY): Payer: Self-pay | Admitting: Registered Nurse

## 2021-07-21 DIAGNOSIS — X789XXA Intentional self-harm by unspecified sharp object, initial encounter: Secondary | ICD-10-CM

## 2021-07-21 DIAGNOSIS — R45851 Suicidal ideations: Secondary | ICD-10-CM

## 2021-07-21 DIAGNOSIS — F1090 Alcohol use, unspecified, uncomplicated: Secondary | ICD-10-CM

## 2021-07-21 DIAGNOSIS — F1094 Alcohol use, unspecified with alcohol-induced mood disorder: Secondary | ICD-10-CM

## 2021-07-21 NOTE — Progress Notes (Signed)
Inpatient Behavioral Health Placement ? ?Pt meets inpatient criteria per Nira Conn, NP. There are no available beds at [facility] per [facility] Fransico Michael, RN.  Pt has been denied at [facility]. Referral was sent to the following facilities;  ? ? ?Destination ?Service Provider Address Phone Fax  ?[facility]  [address] [place] [place] [address] [phone]  ?[facility]  [address] [place] [address] [phone]  ?[facility]  [address], [place] [address] [phone]  ?[facility]  [address] [place] [address] [phone]  ?[facility]  [address] [place] [address] [phone]  ?[facility]  [address] [place] [place] [address] [phone]  ?[facility]  [address], [place] [address] [phone]  ?[facility]  [address] [place] [address] [phone]  ?[facility]  [address] [place], [place] [address] [phone]  ?[facility]  [address] [place] [address] [phone]  ?[facility]  [address], [place] [address] [phone]  ?[facility]  [address] [place], [place] [address] [phone]  ?[facility] [address] [place] [address] [phone]  ?[facility]  [address] [place] [address] [phone]  ?[facility]  [address] [place] [address] [phone]  ? ?Discharge Information ? ?Situation ongoing,  CSW will follow up. ? ? ?Maryjean Ka, MSW, LCSWA ?07/21/2021  @ 12:54 AM ? ?

## 2021-07-21 NOTE — ED Notes (Signed)
Breakfast orders placed 

## 2021-07-21 NOTE — ED Notes (Addendum)
Pt resting in bed lying on back with eyes closed. NAD noted. Equal and unlabored respirations noted. Sitter at doorway. Care plan continued. ?

## 2021-07-21 NOTE — ED Notes (Signed)
Pt requested that his pastor friend who visited be updated tomorrow about reassessment results and plans for inpatient placement/DC based on that reassessment.   ? ?His name is Zachary [name] and he can be reached at [phone] ?

## 2021-07-21 NOTE — Consult Note (Addendum)
Telepsych Consultation  ? ?Reason for Consult:  Suicidal ?Referring Physician:  Lennice Sites, DO ?Location of Patient: [facility] ED ?Location of Provider: Other: [facility] ? ?Patient Identification: Zachary [name] ?MRN:  [id number] ?Principal Diagnosis: Alcohol-induced mood disorder with depressive symptoms ([place]) ?Diagnosis:  Principal Problem: ?  Alcohol-induced mood disorder with depressive symptoms ([place]) ?Active Problems: ?  Suicidal thoughts ?  Suicide and self-inflicted injury by cutting and piercing instrument [facility]) ?  Alcohol use disorder ? ? ?Total Time spent with patient: 45 minutes ? ?Subjective:   ?Zachary [name] is a 61 y.o. male patient admitted to [facility] ED after presenting unaccompanied via EMS after cutting his left forearm with a knife in a suicide attempt. He is reporting symptoms of depression, anxiety, suicidal ideation, and alcohol abuse. ? ?HPI:  Zachary [name], 61 y.o., male patient seen via tele health by this provider, consulted with Dr. Hampton Abbot; and chart reviewed on 07/21/21.  On evaluation Zachary [name] reports he came to the hospital after cutting himself.  Patient states he has been doing well recently found out that his 64 year old father's partner fell and hit her head.  "I want to pick up my father with plans to drop him off in [facility] at my sisters.  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  My father has a house that he abandon and I went there with the intentions of getting it cleaned the and ready so that we could move in but after getting there I just got so overwhelmed with the state that the property was in.  It was overrun with weeds and mice in the ER was a disaster.  I have been sober for 3 years with no drinking at all at first I was taking the Klonopin and it helped but being in the ministry help more than anything.  After started drinking on Saturday I went to [place] because I was having  suicidal thoughts after my relapse.  When I was discharged from there I went straight and got another bottle of alcohol and started drinking.  The drinking just because my depression and anxiety to get worse and I feel because of my relapse that I was going to be accepted back into the ministry and I started having the suicidal thoughts and cut my arm.  I do not think I would have and then that if I was not intoxicated."  Patient reports that he is feeling a lot better and he is feeling hopeful since he has spoken with one of the program directors at the ministry a pastor Cira Servant at [phone].  Reports that Creed Copper came down to see him yesterday and told him that he would be accepted back into the program and that he is willing to pick him up from the hospital.  Patient states "I'm hopeful because I did not think I will respect that I would be accepted back into a faith-based program.  I will have did not there.  Now I am hopeful because I will be able to go back."  At this time patient denies suicidal/self-harm/homicidal ideation, psychosis, and paranoia.  Reports that Maud Deed will be able to pick him up once he is discharged.  Patient gave permission to speak with Creed Copper for collateral information and safety planning. ?During evaluation Zachary [name] is sitting on side of bed in no acute distress.  He  is alert/oriented x 4; calm/cooperative; and mood congruent with affect.  He is speaking in a clear tone at moderate volume, and normal pace; with good eye contact.  His thought process is coherent and relevant; There is no indication that he is currently responding to internal/external stimuli or experiencing delusional thought content; and he has denied suicidal/self-harm/homicidal ideation, psychosis, and paranoia.   ?Patient has remained calm throughout assessment and has answered questions appropriately.   ? ?Collateral information: Spoke with Maud Deed at  [phone].  Mrs. Sellers states that he did visit patient last night and he is aware of what going on.  Reports prior to seeing the patient last night he did not know that the patient had cut himself vertically.  Mr. Tora Perches states that he is the Development worker, international aid of the ministry program and that Mr. Triton was also a board member and a Mudlogger.  "He was on the board and a Mudlogger also he was an Regulatory affairs officer.  Now that I know this did not of everything that is happening I have got to talk to our board to let them know exactly what is going on to see what would happen."  Mr. Tora Perches states he feels the patient will be able to come back in any services the patient needs would be able to happen.  Reports he just wants to talk it over with everyone to make sure everyone is on the same page prior to patient coming back.  Reports he will give a call back once he is spoken with everyone and he will be the one that would be picking patient up.  Reports he has patient was to come back in a program would not be any safety issues. ?  ? ?Past Psychiatric History: Polysubstance abuse, alcohol use disorder, suicidal ideations, depression, anxiety ? ?Risk to Self: Denies at this time ?Risk to Others: Denies ?Prior Inpatient Therapy: Denies ?Prior Outpatient Therapy: Yes ? ?Past Medical History:  ?Past Medical History:  ?Diagnosis Date  ? Alcohol abuse   ? COPD (chronic obstructive pulmonary disease) ([place])   ? Depression   ? Hypertension   ?  ?Past Surgical History:  ?Procedure Laterality Date  ? COLONOSCOPY    ? MOUTH SURGERY    ? NO PAST SURGERIES    ? ?Family History:  ?Family History  ?Problem Relation Age of Onset  ? Heart attack Paternal Grandfather   ? Stroke Mother   ? Heart failure Mother   ? Heart failure Father   ? ?Family Psychiatric  History: None reported ?Social History:  ?Social History  ? ?Substance and Sexual Activity  ?Alcohol Use Yes  ? Comment: BAC was .23 on admission  ?   ?Social History   ? ?Substance and Sexual Activity  ?Drug Use Yes  ? Types: Marijuana  ? Comment: UDS was negative  ?  ?Social History  ? ?Socioeconomic History  ? Marital status: Divorced  ?  Spouse name: Not on file  ? Number of children: 2  ? Years of education: Not on file  ? Highest education level: Not on file  ?Occupational History  ? Occupation: Clinical biochemist  ?Tobacco Use  ? Smoking status: Some Days  ?  Packs/day: 2.00  ?  Types: Cigarettes  ? Smokeless tobacco: Never  ?Vaping Use  ? Vaping Use: Not on file  ?Substance and Sexual Activity  ? Alcohol use: Yes  ?  Comment: BAC was .23 on admission  ? Drug use: Yes  ?  Types: Marijuana  ?  Comment: UDS was negative  ? Sexual activity: Not Currently  ?Other Topics Concern  ? Not on file  ?Social History Narrative  ? Pt stated that he lives alone; works as a Clinical biochemist  ? ?Social Determinants of Health  ? ?Financial Resource Strain: Not on file  ?Food Insecurity: Not on file  ?Transportation Needs: Not on file  ?Physical Activity: Not on file  ?Stress: Not on file  ?Social Connections: Not on file  ? ?Additional Social History: ?  ? ?Allergies:   ?Allergies  ?Allergen Reactions  ? Penicillins Other (See Comments)  ?  Childhood allergy ?Reaction:  Unknown  ?Has patient had a PCN reaction causing immediate rash, facial/tongue/throat swelling, SOB or lightheadedness with hypotension: Unknown ?Has patient had a PCN reaction causing severe rash involving mucus membranes or skin necrosis: Unknown ?Has patient had a PCN reaction that required hospitalization Unknown ?Has patient had a PCN reaction occurring within the last 10 years: No ?If all of the above answers are "NO", then may proceed with Cephalosporin use.  ? Statins Palpitations  ? ? ?Labs:  ?Results for orders placed or performed during the hospital encounter of 07/19/21 (from the past 48 hour(s))  ?Urine rapid drug screen (hosp performed)     Status: Abnormal  ? Collection Time: 07/19/21  8:31 PM  ?Result Value  Ref Range  ? Opiates NONE DETECTED NONE DETECTED  ? Cocaine NONE DETECTED NONE DETECTED  ? Benzodiazepines POSITIVE (A) NONE DETECTED  ? Amphetamines NONE DETECTED NONE DETECTED  ? Tetrahydrocannabinol NONE DETECTED N

## 2021-07-21 NOTE — Progress Notes (Signed)
[facility] LCSW Progress Note ?  ?07/21/2021    3:55 PM ? ?Zachary [name]  ? ?[id number]  ? ?Type of Contact and Topic:  Psychiatric Bed Placement  ? ?Pt accepted to [facility]   ? ?Patient meets inpatient criteria per Earleen Newport, NP   ? ?The attending provider will be Viann Fish, MD  ? ?Call report to [phone]   ? ?Satira Mccallum, RN @ [facility] ED notified.    ? ?Pt scheduled  to arrive at [place] @ 2100.  ? ? ?Zachary [name], MSW, LCSW-A  ?3:57 PM 07/21/2021   ?  ? ?  ?  ? ? ? ? ?  ?

## 2021-07-21 NOTE — ED Provider Notes (Signed)
Emergency Medicine Observation Re-evaluation Note ? ?Zachary [name] is a 61 y.o. male, seen on rounds today.  Pt initially presented to the ED for complaints of Suicide Attempt ?Currently, the patient is resting comfortably while awaiting placement. ? ?Physical Exam  ?BP 115/72 (BP Location: Right Arm)   Pulse 60   Temp 97.7 ?F (36.5 ?C) (Oral)   Resp 16   SpO2 97%  ?Physical Exam ?General: Resting ?Cardiac: No murmur ?Lungs: Clear ?Psych: Resting comfortably.  No complaints ? ?ED Course / MDM  ?EKG:EKG Interpretation ? ?Date/Time:  Monday Jul 19 2021 19:21:15 EDT ?Ventricular Rate:  83 ?PR Interval:  144 ?QRS Duration: 113 ?QT Interval:  393 ?QTC Calculation: 462 ?R Axis:   50 ?Text Interpretation: Sinus rhythm Incomplete right bundle branch block Confirmed by Lockie Mola, Adam ([id number]) on 07/19/2021 10:47:59 PM ? ?I have reviewed the labs performed to date as well as medications administered while in observation.  Recent changes in the last 24 hours include none. ? ?Plan  ?Current plan is for awaiting placement. ?LAFAYETTE DUNLEVY is under involuntary commitment. ?  ? ?  ?Seymore Brodowski, Canary Brim, MD ?07/21/21 [phone] ? ?

## 2021-07-22 ENCOUNTER — Other Ambulatory Visit: Payer: Self-pay

## 2021-07-22 ENCOUNTER — Encounter (HOSPITAL_COMMUNITY): Payer: Self-pay | Admitting: Psychiatry

## 2021-07-22 ENCOUNTER — Inpatient Hospital Stay (HOSPITAL_COMMUNITY)
Admission: RE | Admit: 2021-07-22 | Discharge: 2021-07-24 | DRG: 885 | Disposition: A | Discharge status: Home / Self Care | Payer: Medicaid Other | Source: Intra-hospital | Attending: Psychiatry | Admitting: Psychiatry

## 2021-07-22 DIAGNOSIS — F1024 Alcohol dependence with alcohol-induced mood disorder: Secondary | ICD-10-CM | POA: Diagnosis present

## 2021-07-22 DIAGNOSIS — Z823 Family history of stroke: Secondary | ICD-10-CM | POA: Diagnosis not present

## 2021-07-22 DIAGNOSIS — K219 Gastro-esophageal reflux disease without esophagitis: Secondary | ICD-10-CM | POA: Diagnosis present

## 2021-07-22 DIAGNOSIS — Z8249 Family history of ischemic heart disease and other diseases of the circulatory system: Secondary | ICD-10-CM

## 2021-07-22 DIAGNOSIS — F1020 Alcohol dependence, uncomplicated: Secondary | ICD-10-CM | POA: Diagnosis not present

## 2021-07-22 DIAGNOSIS — I4510 Unspecified right bundle-branch block: Secondary | ICD-10-CM | POA: Diagnosis present

## 2021-07-22 DIAGNOSIS — S51812A Laceration without foreign body of left forearm, initial encounter: Secondary | ICD-10-CM | POA: Diagnosis present

## 2021-07-22 DIAGNOSIS — Y901 Blood alcohol level of 20-39 mg/100 ml: Secondary | ICD-10-CM | POA: Diagnosis present

## 2021-07-22 DIAGNOSIS — F1094 Alcohol use, unspecified with alcohol-induced mood disorder: Secondary | ICD-10-CM | POA: Diagnosis not present

## 2021-07-22 DIAGNOSIS — I10 Essential (primary) hypertension: Secondary | ICD-10-CM | POA: Diagnosis present

## 2021-07-22 DIAGNOSIS — Z20822 Contact with and (suspected) exposure to covid-19: Secondary | ICD-10-CM | POA: Diagnosis present

## 2021-07-22 DIAGNOSIS — S61512A Laceration without foreign body of left wrist, initial encounter: Secondary | ICD-10-CM | POA: Diagnosis present

## 2021-07-22 DIAGNOSIS — F332 Major depressive disorder, recurrent severe without psychotic features: Principal | ICD-10-CM | POA: Diagnosis present

## 2021-07-22 DIAGNOSIS — X789XXA Intentional self-harm by unspecified sharp object, initial encounter: Secondary | ICD-10-CM | POA: Diagnosis present

## 2021-07-22 DIAGNOSIS — J449 Chronic obstructive pulmonary disease, unspecified: Secondary | ICD-10-CM | POA: Diagnosis present

## 2021-07-22 DIAGNOSIS — Z79899 Other long term (current) drug therapy: Secondary | ICD-10-CM | POA: Diagnosis not present

## 2021-07-22 DIAGNOSIS — R45851 Suicidal ideations: Secondary | ICD-10-CM | POA: Diagnosis not present

## 2021-07-22 LAB — TSH: TSH: 2.302 u[IU]/mL (ref 0.350–4.500)

## 2021-07-22 LAB — LIPID PANEL
Cholesterol: 224 mg/dL — ABNORMAL HIGH (ref 0–200)
HDL: 95 mg/dL (ref >40)
LDL Cholesterol: 94 mg/dL (ref 0–99)
Total CHOL/HDL Ratio: 2.4 RATIO
Triglycerides: 173 mg/dL — ABNORMAL HIGH (ref <150)
VLDL: 35 mg/dL (ref 0–40)

## 2021-07-22 LAB — HEMOGLOBIN A1C
Hgb A1c MFr Bld: 4.9 % (ref 4.8–5.6)
Mean Plasma Glucose: 93.93 mg/dL

## 2021-07-22 MED ORDER — ALBUTEROL SULFATE HFA 108 (90 BASE) MCG/ACT IN AERS: 1.0000 | INHALATION_SPRAY | Freq: Four times a day (QID) | RESPIRATORY_TRACT | Status: DC | PRN

## 2021-07-22 MED ORDER — LORAZEPAM 1 MG PO TABS: 1.0000 mg | ORAL_TABLET | Freq: Four times a day (QID) | ORAL | Status: DC | PRN

## 2021-07-22 MED ORDER — LISINOPRIL 10 MG PO TABS
10.0000 mg | ORAL_TABLET | Freq: Every day | ORAL | Status: DC
  Administered 2021-07-22 – 2021-07-24 (×3): 10 mg via ORAL
  Filled 2021-07-22 (×4): qty 1

## 2021-07-22 MED ORDER — MAGNESIUM HYDROXIDE 400 MG/5ML PO SUSP: 30.0000 mL | Freq: Every day | ORAL | Status: DC | PRN

## 2021-07-22 MED ORDER — LOPERAMIDE HCL 2 MG PO CAPS: 2.0000 mg | ORAL_CAPSULE | ORAL | Status: DC | PRN

## 2021-07-22 MED ORDER — THIAMINE HCL 100 MG PO TABS
100.0000 mg | ORAL_TABLET | Freq: Every day | ORAL | Status: DC
  Administered 2021-07-23 – 2021-07-24 (×2): 100 mg via ORAL
  Filled 2021-07-22 (×3): qty 1

## 2021-07-22 MED ORDER — HYDROXYZINE HCL 25 MG PO TABS: 25.0000 mg | ORAL_TABLET | Freq: Four times a day (QID) | ORAL | Status: DC | PRN

## 2021-07-22 MED ORDER — PANTOPRAZOLE SODIUM 40 MG PO TBEC
40.0000 mg | DELAYED_RELEASE_TABLET | Freq: Every day | ORAL | Status: DC
  Administered 2021-07-22 – 2021-07-24 (×3): 40 mg via ORAL
  Filled 2021-07-22 (×5): qty 1

## 2021-07-22 MED ORDER — GABAPENTIN 300 MG PO CAPS
300.0000 mg | ORAL_CAPSULE | Freq: Three times a day (TID) | ORAL | Status: DC
  Administered 2021-07-22 – 2021-07-24 (×6): 300 mg via ORAL
  Filled 2021-07-22 (×10): qty 1

## 2021-07-22 MED ORDER — ALUM & MAG HYDROXIDE-SIMETH 200-200-20 MG/5ML PO SUSP
30.0000 mL | ORAL | Status: DC | PRN
Start: 2021-07-22 — End: 2021-07-24

## 2021-07-22 MED ORDER — ACETAMINOPHEN 500 MG PO TABS: 1000.0000 mg | ORAL_TABLET | Freq: Four times a day (QID) | ORAL | Status: DC | PRN

## 2021-07-22 MED ORDER — HYDROCHLOROTHIAZIDE 12.5 MG PO TABS
12.5000 mg | ORAL_TABLET | Freq: Every day | ORAL | Status: DC
  Administered 2021-07-22 – 2021-07-24 (×3): 12.5 mg via ORAL
  Filled 2021-07-22 (×5): qty 1

## 2021-07-22 MED ORDER — ADULT MULTIVITAMIN W/MINERALS CH
1.0000 | ORAL_TABLET | Freq: Every day | ORAL | Status: DC
  Administered 2021-07-22 – 2021-07-24 (×3): 1 via ORAL
  Filled 2021-07-22 (×5): qty 1

## 2021-07-22 NOTE — BHH Counselor (Signed)
Adult Comprehensive Assessment ? ?Patient ID: Zachary [name], male   DOB: [birth year], 61 y.o.   MRN: [id number] ? ?Information Source: ?Information source: Patient ?  ?Current Stressors:  ?Patient states their primary concerns and needs for treatment are:: "Suicide attempt by cutting left arm". ?Patient states their goals for this hospitilization and ongoing recovery are:: "To get clear about my discharge plans and organize my thoughts". ?Educational / Learning stressors: Pt reports having a 12th grade education and graduating from [employer].  ?Employment / Job issues: Pt reports quitting his job 1 week ago.  ?Family Relationships: Pt reports some conflict with father due to father's recent diagnosis of Dementia. ?Financial / Lack of resources (include bankruptcy): Pt reports receiving SSI and Medicaid. ?Housing / Lack of housing: Pt reports being "between homes" but can live with sister or girlfriend. ?Physical health (include injuries & life threatening diseases): Pt reports no stressors. ?Social relationships: Pt reports having few social relationships. ?Substance abuse: Pt reports relapse on Alcohol for 4 days. Reports 3 years sober prior to this relapse.  ?Bereavement / Loss: Pt reports mother passed away in 11-Jul-2006 and 1 sister passed away in 1994-07-11. ?  ?Living/Environment/Situation:  ?Living Arrangements: "Between Homes" ?Living conditions (as described by patient or guardian): "I have places to live.  I just need to make a decision". ?Who else lives in the home?: Alone ?How long has patient lived in current situation?: 1 week ?What is atmosphere in current home: Temporary ?  ?Family History:  ?Marital status: Long-Term Relationship, 10 months ?Divorced, when?: 07-10-00 ?What types of issues is patient dealing with in the relationship?: N/A ?Additional relationship information: Broke up with a girlfriend 1 week ago but states they are back together now.  ?Are you sexually active?: Yes ?What is your sexual  orientation?: Heterosexual ?Has your sexual activity been affected by drugs, alcohol, medication, or emotional stress?: no ?Does patient have children?: Yes ?How many children?: 3 ?How is patient's relationship with their children?: Ages 3, 63, and 68 "We get along really good now".  ?  ?Childhood History:  ?By whom was/is the patient raised?: Both parents ?Additional childhood history information: born w cleft lip; "it was the family secret, they would hide me when company would come over; I was told that I hurt my lip while climbing in the closet - they were Catholic and a birth defect was evidence of sin - my sister told me when I was 62 what had really happened" struggled w self acceptance due to cleft lip - difficulty w peer relationships "girls didn't want to date me", felt shamed by family, "I blamed myself for my lip all these years and then I found out it wasn't my fault" ?Description of patient's relationship with caregiver when they were a child: not great w father, ok w mother ?Patient's description of current relationship with people who raised him/her: Mother is deceased.  Poor relationship with father who let mother's house decay to the point it is in now and has Dementia.  Pt reports his father is often angry and it is difficult to manage. ?How were you disciplined when you got in trouble as a child/adolescent?: yelling ?Does patient have siblings?: Yes ?Number of Siblings: 2 ?Description of patient's current relationship with siblings: one sister died of cancer; remaining sister is supportive but overwhelmed with her own problems including current foreclosure ?Did patient suffer any verbal/emotional/physical/sexual abuse as a child?: Yes(Emotional - hidden by parents due to their shame over his cleft palate.) ?  Did patient suffer from severe childhood neglect?: No ?Has patient ever been sexually abused/assaulted/raped as an adolescent or adult?: No ?Was the patient ever a victim of a crime or a  disaster?: No ?Witnessed domestic violence?: No ?Has patient been effected by domestic violence as an adult?: No ?  ?Education:  ?Highest grade of school patient has completed: High school diploma and graduated from [employer].  ?Currently a student?: No ?Learning disability?: No ?  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  ?Financial Resources:   ?Financial resources: Income from [place] and Medicaid  ?Does patient have a representative payee or guardian?: No ?  ?Alcohol/Substance Abuse:   ?What has been your use of drugs/alcohol within the last 12 months?: Pt reports relapse on Alcohol for 4 days.  States he was sober 3 years prior to his relapse.  ?Alcohol/Substance Abuse Treatment Hx: Past detox, Past Tx, Inpatient, Past Tx, Outpatient ?If yes, describe treatment: [facility] multiple times, [facility] multiple times, [facility], [facility], [facility] ?Has alcohol/substance abuse ever caused legal problems?: No current concerns  ?  ?Social Support System:   ?Patient's Community Support System: Good ?Describe Community Support System: Sister, Girlfriend, and 3 grown children. ?Type of faith/religion: Darrick Meigs ?How does patient's faith help to cope with current illness?: Church, prayer, reading the Bible ?  ?Leisure/Recreation:   ?Leisure and Hobbies: Music, playing Guitar, studying the Bible, board games, and gardening  ?  ?Strengths/Needs:   ?What is the patient's perception  of their strengths?: Leadership and Communication ?Patient states they can use these personal strengths during their treatment to contribute to their recovery: "I can encourage myself and talk things out with other people". ?Patient states these barriers may affect/interfere with their treatment: None ?Patient states these barriers may affect their return to the community: None ?Other important information patient would like considered in planning for their treatment: None ?  ?Discharge Plan:   ?Currently receiving community mental health services: No  ?Patient states concerns and preferences for aftercare planning are: Pt is interested in therapy and medication management in the [place] or [place] areas.  ?Patient states they will know when they are safe and ready for discharge when: "I feel that I am ready now". ?Does patient have access to transportation?: Yes (Own car at home in [place]) ?Does patient have financial barriers related to discharge medications?: No ?Patient description of barriers related to discharge medications: N/A ?Will patient be returning to same living situation after discharge?: No, Sister or Girlfriend's home.  ?  ? ?Summary/Recommendations:   ?Summary and Recommendations (to be completed by the evaluator): Zachary [name] is a 61 year old, male, who was admitted to the hospital due to a suicide attempt by cutting his left arm.  The Pt denies depression and anxiety and states that he was overwhelmed with life stressors prior to cutting himself.  The Pt states that he was experiencing work stress due to the Director's indecision, taking care of his 15 year old  father and his father's house, and going through a break-up with his girlfriend.  The Pt reports that since then he has gotten back wtih his girlfriend and repaired that relationship.  He states that he is currently "between homes" and plans to live with either his siter or girlfriend after discharge.  He reports having a  support system of his sister, girlfriend, and 3 children (ages 28, 38 and 66).  He states that his mother passed away in 2006/06/22 and his other sister passed away in 06/22/94.  The Pt reports his father is often angry and

## 2021-07-22 NOTE — Progress Notes (Signed)
[facility] Group Notes:  (Nursing/MHT/Case Management/Adjunct) ? ?Date:  07/22/2021  ?Time:  2015 ?Type of Therapy:   wrap up group ? ?Participation Level:  Active ? ?Participation Quality:  Appropriate, Attentive, Sharing, and Supportive ? ?Affect:  Appropriate ? ?Cognitive:  Alert ? ?Insight:  Improving ? ?Engagement in Group:  Engaged ? ?Modes of Intervention:  Clarification, Education, and Support ? ?Summary of Progress/Problems: Positive thinking and positive change were discussed.  ? ?Johann Capers S ?07/22/2021, 9:13 PM ?

## 2021-07-22 NOTE — [facility] Suicide Risk Assessment (Signed)
B[facility] INPATIENT:  Family/Significant Other Suicide Prevention Education ? ?Suicide Prevention Education:  ?Education Completed; Felicity Coyer [phone] (Sister) has been identified by the patient as the family member/significant other with whom the patient will be residing, and identified as the person(s) who will aid the patient in the event of a mental health crisis (suicidal ideations/suicide attempt).  With written consent from the patient, the family member/significant other has been provided the following suicide prevention education, prior to the and/or following the discharge of the patient. ? ?The suicide prevention education provided includes the following: ?Suicide risk factors ?Suicide prevention and interventions ?National Suicide Hotline telephone number ?[facility] assessment telephone number ?[facility] Emergency Assistance 911 ?[place] and/or Residential Mobile Crisis Unit telephone number ? ?Request made of family/significant other to: ?Remove weapons (e.g., guns, rifles, knives), all items previously/currently identified as safety concern.   ?Remove drugs/medications (over-the-counter, prescriptions, illicit drugs), all items previously/currently identified as a safety concern. ? ?The family member/significant other verbalizes understanding of the suicide prevention education information provided.  The family member/significant other agrees to remove the items of safety concern listed above. ? ?CSW spoke with Mrs. Lew Dawes who states that her brother has been experiencing many life stressors recently.  She states that her brother has been overwhelmed and that he has expressed those concerns to her.  She confirms that her brother will be coming to live with her after discharge and that she will continue to be a support for him.  She states that there are no firearms or weapons in the home.  CSW completed SPE with Mrs. Shephard.  ? ?Aram Beecham ?07/22/2021, 3:29 PM ?

## 2021-07-22 NOTE — [facility] Suicide Risk Assessment (Cosign Needed)
Suicide Risk Assessment ? ?Admission Assessment    ?[facility] Admission Suicide Risk Assessment ? ? ?Nursing information obtained from:  Patient ? ?Demographic factors:  Male, Caucasian ? ?Current Mental Status:  NA ? ?Loss Factors:  Loss of significant relationship, Decrease in vocational status, Financial problems / change in socioeconomic status ? ?Historical Factors:  Prior suicide attempts, Impulsivity ? ?Risk Reduction Factors:  Employed ? ?Total Time spent with patient: 1 hour ? ?Principal Problem: Alcohol use disorder, severe, dependence (HCC) ? ?Diagnosis:  Principal Problem: ?  Alcohol use disorder, severe, dependence (HCC) ?Active Problems: ?  MDD (major depressive disorder), recurrent episode, severe (HCC) ? ?Subjective Data: (Per H&P):  This is one of several psychiatric admission in this [facility] for this 61 year old Caucasian male with hx of alcoholism & other mental health issues. He is know in this [facility] from his previous admissions for alcohol intoxications, suicidal ideations or attempt & worsening symptoms of depression/anxiety/ Patient was last admitted/treated in the [facility] in April of 2020. After stabilization, he was discharged with medications & a referral for further psychiatric services. Patient has also been to other psychiatric hospitals with the area with same complaints. He is being admitted to the [facility] this time around with complaint of suicide attempt by self-inflicted lacerations to his inner left wrist. He received 10 stitches at the ED. He was also intoxicated with a BAL of 33 upon his arrival at the ED.  ? ?Continued Clinical Symptoms:  ?Alcohol Use Disorder Identification Test Final Score (AUDIT): 16 ?The "Alcohol Use Disorders Identification Test", Guidelines for Use in Primary Care, Second Edition.  World Science writer [facility]). ?Score between 0-7:  no or low risk or alcohol related problems. ?Score between 8-15:  moderate risk of alcohol related problems. ?Score between 16-19:  high risk  of alcohol related problems. ?Score 20 or above:  warrants further diagnostic evaluation for alcohol dependence and treatment. ? ?CLINICAL FACTORS:  ? Depression:   Impulsivity ?Alcohol/Substance Abuse/Dependencies ?More than one psychiatric diagnosis ?Previous Psychiatric Diagnoses and Treatments ? ? ?Musculoskeletal: ?Strength & Muscle Tone: within normal limits ?Gait & Station: normal ?Patient leans: N/A ? ?Psychiatric Specialty Exam: ? ?Presentation  ?General Appearance: Appropriate for Environment; Casual; Disheveled ? ?Eye Contact:Good ? ?Speech:Clear and Coherent; Normal Rate ? ?Speech Volume:Normal ? ?Handedness:Right ? ? ?Mood and Affect  ?Mood:Depressed; Anxious ? ?Affect:Congruent; Depressed ? ? ?Thought Process  ?Thought Processes:Coherent ? ?Descriptions of Associations:Intact ? ?Orientation:Full (Time, Place and Person) ? ?Thought Content:Logical ? ?History of Schizophrenia/Schizoaffective disorder:No ? ?Duration of Psychotic Symptoms:No data recorded ?Hallucinations:Hallucinations: None ? ?Ideas of Reference:None ? ?Suicidal Thoughts:Suicidal Thoughts: No ? ?Homicidal Thoughts:Homicidal Thoughts: No ? ? ?Sensorium  ?Memory:Recent Good; Immediate Good; Remote Good ? ?Judgment:Fair ? ?Insight:Fair ? ? ?Executive Functions  ?Concentration:Fair ? ?Attention Span:Fair ? ?Recall:Good ? ?Fund of Knowledge:Good ? ?Language:Good ? ? ?Psychomotor Activity  ?Psychomotor Activity:Psychomotor Activity: Normal ? ?Assets  ?Assets:Communication Skills ? ?Sleep  ?Sleep:Sleep: Fair ?Number of Hours of Sleep: 5.5 ? ?Physical Exam: ?Blood pressure 132/78, pulse (!) 59, temperature 97.6 ?F (36.4 ?C), temperature source Oral, resp. rate 18, height 5\' 10"  (1.778 m), weight 72.6 kg, SpO2 98 %. Body mass index is 22.96 kg/m?. ? ?COGNITIVE FEATURES THAT CONTRIBUTE TO RISK:  ?Closed-mindedness, Polarized thinking, and Thought constriction (tunnel vision)   ? ?SUICIDE RISK:  ? Severe:  Frequent, intense, and enduring suicidal  ideation, specific plan, no subjective intent, but some objective markers of intent (i.e., choice of lethal method), the method is accessible, some limited  preparatory behavior, evidence of impaired self-control, severe dysphoria/symptomatology, multiple risk factors present, and few if any protective factors, particularly a lack of social support. ? ?PLAN OF CARE: See treatment plan on H&P. ? ?I certify that inpatient services furnished can reasonably be expected to improve the patient's condition.  ? ?Armandina Stammer, NP, pmhnp, fnp-bc. ?07/22/2021, 1:17 PM ?

## 2021-07-22 NOTE — Progress Notes (Signed)
EDMUND HOLCOMB is a 61 y.o. male involuntarily admitted for suicide attempt by cutting his left wrist with a knife. Pt has a history of severe anxiety, run out of his anxiety medication in January this year, did not refill because he thought he would manage without meds. Relapsed after 3 years of sober from alcohol, due to this his girl friend told him to leave. He states last week his father's wife fell and was seriously injured and his supervisor at work threatened to leave the company. He says he felt overwhelmed and relapsed on alcohol on May 4th. He says since relapsing he has been drinking continuously including entire bottles of wine, tequila, and Listerine. He reports he has experienced blackouts this past weekend, describing waking up in his car and not remembering how he got there. Pt denies SI/HI and contracted for safety. Consents signed, skin/belongings search completed and pt oriented to unit. Pt stable at this time. Pt given the opportunity to express concerns and ask questions. Pt given toiletries. Will continue to monitor.  ? ?

## 2021-07-22 NOTE — Progress Notes (Signed)
?   07/22/21 6761  ?Psych Admission Type (Psych Patients Only)  ?Admission Status Involuntary  ?Psychosocial Assessment  ?Patient Complaints Anxiety  ?Eye Contact Fair  ?Facial Expression Animated  ?Affect Appropriate to circumstance  ?Speech Logical/coherent  ?Interaction Assertive  ?Motor Activity Slow  ?Appearance/Hygiene In scrubs  ?Behavior Characteristics Cooperative;Appropriate to situation  ?Mood Pleasant  ?Thought Process  ?Coherency WDL  ?Content WDL  ?Delusions None reported or observed  ?Perception WDL  ?Hallucination None reported or observed  ?Judgment Poor  ?Confusion None  ?Danger to Self  ?Current suicidal ideation? Denies  ?Danger to Others  ?Danger to Others None reported or observed  ? ? ?

## 2021-07-22 NOTE — Plan of Care (Signed)
?  Problem: Coping: ?Goal: Coping ability will improve ?Outcome: Progressing ?  ?Problem: Activity: ?Goal: Interest or engagement in leisure activities will improve ?Outcome: Progressing ?Goal: Imbalance in normal sleep/wake cycle will improve ?Outcome: Progressing ?  ?

## 2021-07-22 NOTE — ED Notes (Signed)
ED transfer consent completed, patient is unable to refuse transfer at this time due to IVC court order.  ?

## 2021-07-22 NOTE — ED Notes (Signed)
Downtime was from 0100-0145, during this time transfer of this patient to [facility] was initiated and completed. All appropriate paperwork and IVC paperwork was provided to [facility] officers; [place] and [place], upon arrival for transfer to [facility]. Envelope created for [facility] and appropriate paperwork provided for facility. All of patients belongings were given to [employer] for transport to next facility. Patient VSS with no complaints at this time and all questions answered. Patient in no acute distress.  ?

## 2021-07-22 NOTE — H&P (Signed)
Psychiatric Admission Assessment Adult ? ?Patient Identification: RAYSHARD SCHIRTZINGER ? ?MRN:  [id number] ? ?Date of Evaluation:  07/22/2021 ? ?Chief Complaint: Suicide attempt by self-inflicted lacerations to inner left wrist, received 10 stitches at the ED. ? ?Principal Diagnosis: Alcohol use disorder, severe, dependence (HCC) ? ?Diagnosis:  Principal Problem: ?  Alcohol use disorder, severe, dependence (HCC) ?Active Problems: ?  MDD (major depressive disorder), recurrent episode, severe (HCC) ? ?History of Present Illness: This is one of several psychiatric admission in this [facility] for this 61 year old Caucasian male with hx of alcoholism & other mental health issues. He is know in this [facility] from his previous admissions for alcohol intoxications, suicidal ideations or attempt & worsening symptoms of depression/anxiety/ Patient was last admitted/treated in the [facility] in April of 2020. After stabilization, he was discharged with medications & a referral for further psychiatric services. Patient has also been to other psychiatric hospitals with the area with same complaints. He is being admitted to the [facility] this time around with complaint of suicide attempt by self-inflicted lacerations to his inner left wrist. He received 10 stitches at the ED. He was also intoxicated with a BAL of 33 upon his arrival at the ED.  ? ? (Per the ED consult note): (Below information from behavioral health assessment has been reviewed by me and I agreed with the findings).Georgina Peer, 61 y.o., male patient seen via tele health by this provider, consulted with Dr. Nelly Rout; and chart reviewed on 07/21/21.  On evaluation YOUSSEF FOOTMAN reports he came to the hospital after cutting himself.  Patient states he has been doing well recently found out that his 22 year old father's partner fell and hit her head.  "I want to pick up my father with plans to drop him off in [facility] at my sisters.  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  My father has a house that he abandon and I went there with the intentions of getting it cleaned the and ready so that we could move in but after getting there I just got so overwhelmed with the state that the property was in.  It was overrun with weeds and mice in the ER was a disaster.  I have been sober for 3 years with no drinking at all at first I was taking the Klonopin and it helped but being in the ministry help more than anything.  After started drinking on Saturday I went to [place] because I was having suicidal thoughts after my relapse.  When I was discharged from there I went straight and got another bottle of alcohol and started drinking.  The drinking just because my depression and anxiety to get worse and I feel because of my relapse that I was going to be accepted back into the ministry and I started having the suicidal thoughts and cut my arm.  I do not think I would have and then that if I was not intoxicated."  Patient reports that he is feeling a lot better and he is feeling hopeful since he has spoken with one of the program directors at the ministry a pastor Aram Beecham at [phone].  Reports that Terisa Starr came down to see him yesterday and told him that he would be accepted back into the program and that he is willing to pick him up from the hospital.  Patient states "I'm hopeful because I did  not think I will respect that I would be accepted back into a faith-based program.  I will have did not there.  Now I am hopeful because I will be able to go back."  At this time patient denies suicidal/self-harm/homicidal ideation, psychosis, and paranoia.  Reports that Truett Mainland will be able to pick him up once he is discharged.  Patient gave permission to speak with Terisa Starr for collateral information and safety planning. ? ?During this evaluation, Jibran reports that he is currently doing well &  feeling better. He denies any symptoms of depression or anxiety. He denies any alcohol withdrawal symptoms. He denies any new issues or concerns. He says he is ready to be discharged as he planned to go home to check on his dogs. He says he got stuff to do. He says he knows how to plan his life for the better moving forward after his self-mutilating experience. 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 He says he has a job waiting for him after discharge as his son is willing to start working with him. He says he was considered depressed because other people made him to believe as such. He says his problems has always been anxiety & clonazepam helped in in the past to feel better & happy. He says was on it for 3 years until his outpatient provider retired in January of 2023. He says being here, we have not made any attempt to give him any klonopin since being here. He currently denies any SIHI, AVH, delusional thoughts or paranoia. He does not appear to be responding to any internal stimuli. The wound to his inner left wrist remains intact without any swellings, drainage or odor.  ? ?Associated Signs/Symptoms: ? ?Depression Symptoms:   Patient at this time denies any symptoms of depression or anxiety.  ? ?(Hypo) Manic Symptoms:  Impulsivity, ? ?Anxiety Symptoms:  Excessive Worry, ? ?Psychotic Symptoms:   Patient denies any hallucinations, delusions or paranoia. ? ?PTSD Symptoms: Patient denies any PTSD symptoms or events. ?NA ? ?Total Time spent with patient: 1 hour ? ?Past Psychiatric History: Patient has had multiple psychiatric hospitalizations here at [facility] in the past.  Review of the electronic medical record revealed his last hospitalization at our facility was on 07-08-18. Has been a patient at the [facility], [facility] health care, [facility] & [facility].  He has been treated with multiple medications in the past, but was never compliant. ? ?Is the patient at risk  to self? No.  ?Has the patient been a risk to self in the past 6 months? Yes.    ?Has the patient been a risk to self within the distant past? Yes.    ?Is the patient a risk to others? No.  ?Has the patient been a risk to others in the past 6 months? No.  ?Has the patient been a risk to others within the distant past? No.  ? ?Prior Inpatient Therapy: Yes, [facility] x numerous times. ?Prior Outpatient Therapy: Yes ? ?Alcohol Screening: Patient refused Alcohol Screening Tool: Yes ?1. How often do you have a drink containing alcohol?: Monthly or less ?2. How many drinks containing alcohol do you have on a typical day when you are drinking?: 5 or 6 ?3. How often do you have six or more drinks on one occasion?: Monthly ?AUDIT-C Score: 5 ?4. How often during the last year have you found that you were not able  to stop drinking once you had started?: Less than monthly ?5. How often during the last year have you failed to do what was normally expected from you because of drinking?: Less than monthly ?6. How often during the last year have you needed a first drink in the morning to get yourself going after a heavy drinking session?: Less than monthly ?7. How often during the last year have you had a feeling of guilt of remorse after drinking?: Weekly ?8. How often during the last year have you been unable to remember what happened the night before because you had been drinking?: Less than monthly ?9. Have you or someone else been injured as a result of your drinking?: No ?10. Has a relative or friend or a doctor or another health worker been concerned about your drinking or suggested you cut down?: Yes, during the last year ?Alcohol Use Disorder Identification Test Final Score (AUDIT): 16 ?Alcohol Brief Interventions/Follow-up: Alcohol education/Brief advice ? ?Substance Abuse History in the last 12 months:  Yes.   ? ?Consequences of Substance Abuse: Discussed witg patient during this admission evaluation. ?Medical Consequences:   Liver damage, Possible death by overdose ?Legal Consequences:  Arrests, jail time, Loss of driving privilege. ?Family Consequences:  Family discord, divorce and or separation.  ? ?Previous Psychotropic Medica

## 2021-07-22 NOTE — ED Notes (Signed)
Patient discharge @ 0119 from [facility] facility.  ?

## 2021-07-22 NOTE — Tx Team (Signed)
Initial Treatment Plan ?07/22/2021 ?3:44 AM ?Zachary [name] ?[id number] ? ? ? ?PATIENT STRESSORS: ?Financial difficulties   ?Marital or family conflict   ?Occupational concerns   ?Substance abuse   ? ? ?PATIENT STRENGTHS: ?Ability for insight  ?Active sense of humor  ?Supportive family/friends  ?Work skills  ? ? ?PATIENT IDENTIFIED PROBLEMS: ?Substance abuse  ?Depression  ?Anxiety  ?"Help me feel confidence going out"  ?"To get out of here as soon as possible"  ?  ?  ?  ?  ?  ? ?DISCHARGE CRITERIA:  ?Ability to meet basic life and health needs ?Adequate post-discharge living arrangements ?Improved stabilization in mood, thinking, and/or behavior ?Reduction of life-threatening or endangering symptoms to within safe limits ?Verbal commitment to aftercare and medication compliance ? ?PRELIMINARY DISCHARGE PLAN: ?Attend aftercare/continuing care group ?Attend PHP/IOP ?Attend 12-step recovery group ?Outpatient therapy ?Return to previous living arrangement ? ?PATIENT/FAMILY INVOLVEMENT: ?This treatment plan has been presented to and reviewed with the patient, Zachary [name], and/or family member, su.  The patient and family have been given the opportunity to ask questions and make suggestions. ? ?Wolfgang Phoenix, RN ?07/22/2021, 3:44 AM ?

## 2021-07-23 ENCOUNTER — Encounter (HOSPITAL_COMMUNITY): Payer: Self-pay

## 2021-07-23 NOTE — BH IP Treatment Plan (Signed)
Interdisciplinary Treatment and Diagnostic Plan Update ? ?07/23/2021 ?Time of Session: 10:10am ?CHICO CAWOOD ?MRN: [id number] ? ?Principal Diagnosis: Alcohol use disorder, severe, dependence (HCC) ? ?Secondary Diagnoses: Principal Problem: ?  Alcohol use disorder, severe, dependence (HCC) ?Active Problems: ?  MDD (major depressive disorder), recurrent episode, severe (HCC) ? ? ?Current Medications:  ?Current Facility-Administered Medications  ?Medication Dose Route Frequency Provider Last Rate Last Admin  ? acetaminophen (TYLENOL) tablet 1,000 mg  1,000 mg Oral Q6H PRN Nira Conn A, NP      ? albuterol (VENTOLIN HFA) 108 (90 Base) MCG/ACT inhaler 1-2 puff  1-2 puff Inhalation Q6H PRN Armandina Stammer I, NP      ? alum & mag hydroxide-simeth (MAALOX/MYLANTA) 200-200-20 MG/5ML suspension 30 mL  30 mL Oral Q4H PRN Nira Conn A, NP      ? gabapentin (NEURONTIN) capsule 300 mg  300 mg Oral TID Armandina Stammer I, NP   300 mg at 07/23/21 1355  ? lisinopril (ZESTRIL) tablet 10 mg  10 mg Oral Daily Nira Conn A, NP   10 mg at 07/23/21 0805  ? And  ? hydrochlorothiazide (HYDRODIURIL) tablet 12.5 mg  12.5 mg Oral Daily Nira Conn A, NP   12.5 mg at 07/23/21 0623  ? hydrOXYzine (ATARAX) tablet 25 mg  25 mg Oral Q6H PRN Nira Conn A, NP      ? loperamide (IMODIUM) capsule 2-4 mg  2-4 mg Oral PRN Jackelyn Poling, NP      ? LORazepam (ATIVAN) tablet 1 mg  1 mg Oral Q6H PRN Nira Conn A, NP      ? magnesium hydroxide (MILK OF MAGNESIA) suspension 30 mL  30 mL Oral Daily PRN Jackelyn Poling, NP      ? multivitamin with minerals tablet 1 tablet  1 tablet Oral Daily Nira Conn A, NP   1 tablet at 07/23/21 0805  ? pantoprazole (PROTONIX) EC tablet 40 mg  40 mg Oral Daily Nira Conn A, NP   40 mg at 07/23/21 0805  ? thiamine tablet 100 mg  100 mg Oral Daily Nira Conn A, NP   100 mg at 07/23/21 7628  ? ?PTA Medications: ?Medications Prior to Admission  ?Medication Sig Dispense Refill Last Dose  ? albuterol (VENTOLIN HFA) 108  (90 Base) MCG/ACT inhaler Inhale 1-2 puffs into the lungs every 6 (six) hours as needed for wheezing or shortness of breath. 1 Inhaler 2   ? chlordiazePOXIDE (LIBRIUM) 25 MG capsule Take by mouth. (Patient not taking: Reported on 07/20/2021)     ? cholecalciferol (VITAMIN D3) 25 MCG (1000 UT) tablet Take 1,000 Units by mouth daily. (Patient not taking: Reported on 07/20/2021)     ? clonazePAM (KLONOPIN) 1 MG tablet Take 1 tablet (1 mg total) by mouth 4 (four) times daily as needed. (Patient not taking: Reported on 07/20/2021) 120 tablet 0   ? escitalopram (LEXAPRO) 10 MG tablet Take 1 tablet (10 mg total) by mouth daily. (Patient not taking: Reported on 07/20/2021) 30 tablet 0   ? gabapentin (NEURONTIN) 300 MG capsule Take 1 capsule (300 mg total) by mouth 3 (three) times daily. (Patient not taking: Reported on 07/20/2021) 90 capsule 2   ? ibuprofen (ADVIL) 600 MG tablet Take 600 mg by mouth 3 (three) times daily as needed. (Patient not taking: Reported on 07/20/2021)     ? lisinopril-hydrochlorothiazide (ZESTORETIC) 10-12.5 MG tablet Take 1 tablet by mouth daily. (Patient not taking: Reported on 07/20/2021)     ? ondansetron (  ZOFRAN) 4 MG tablet Take 1 tablet (4 mg total) by mouth every 6 (six) hours as needed for nausea. (Patient not taking: Reported on 07/20/2021) 10 tablet 0   ? pantoprazole (PROTONIX) 40 MG tablet Take 1 tablet (40 mg total) by mouth daily. (Patient not taking: Reported on 07/20/2021) 30 tablet 0   ? traZODone (DESYREL) 100 MG tablet Take 1 tablet (100 mg total) by mouth at bedtime as needed for sleep. (Patient not taking: Reported on 07/20/2021) 90 tablet 1   ? ? ?Patient Stressors: Financial difficulties   ?Marital or family conflict   ?Occupational concerns   ?Substance abuse   ? ?Patient Strengths: Ability for insight  ?Active sense of humor  ?Supportive family/friends  ?Work skills  ? ?Treatment Modalities: Medication Management, Group therapy, Case management,  ?1 to 1 session with clinician,  Psychoeducation, Recreational therapy. ? ? ?Physician Treatment Plan for Primary Diagnosis: Alcohol use disorder, severe, dependence (HCC) ?Long Term Goal(s): Improvement in symptoms so as ready for discharge  ? ?Short Term Goals: Ability to identify and develop effective coping behaviors will improve ?Ability to maintain clinical measurements within normal limits will improve ?Compliance with prescribed medications will improve ?Ability to identify triggers associated with substance abuse/mental health issues will improve ?Ability to identify changes in lifestyle to reduce recurrence of condition will improve ?Ability to verbalize feelings will improve ?Ability to disclose and discuss suicidal ideas ?Ability to demonstrate self-control will improve ? ?Medication Management: Evaluate patient's response, side effects, and tolerance of medication regimen. ? ?Therapeutic Interventions: 1 to 1 sessions, Unit Group sessions and Medication administration. ? ?Evaluation of Outcomes: Progressing ? ?Physician Treatment Plan for Secondary Diagnosis: Principal Problem: ?  Alcohol use disorder, severe, dependence (HCC) ?Active Problems: ?  MDD (major depressive disorder), recurrent episode, severe (HCC) ? ?Long Term Goal(s): Improvement in symptoms so as ready for discharge  ? ?Short Term Goals: Ability to identify and develop effective coping behaviors will improve ?Ability to maintain clinical measurements within normal limits will improve ?Compliance with prescribed medications will improve ?Ability to identify triggers associated with substance abuse/mental health issues will improve ?Ability to identify changes in lifestyle to reduce recurrence of condition will improve ?Ability to verbalize feelings will improve ?Ability to disclose and discuss suicidal ideas ?Ability to demonstrate self-control will improve    ? ?Medication Management: Evaluate patient's response, side effects, and tolerance of medication  regimen. ? ?Therapeutic Interventions: 1 to 1 sessions, Unit Group sessions and Medication administration. ? ?Evaluation of Outcomes: Progressing ? ? ?RN Treatment Plan for Primary Diagnosis: Alcohol use disorder, severe, dependence (HCC) ?Long Term Goal(s): Knowledge of disease and therapeutic regimen to maintain health will improve ? ?Short Term Goals: Ability to remain free from injury will improve, Ability to verbalize frustration and anger appropriately will improve, Ability to demonstrate self-control, Ability to participate in decision making will improve, Ability to verbalize feelings will improve, Ability to disclose and discuss suicidal ideas, Ability to identify and develop effective coping behaviors will improve, and Compliance with prescribed medications will improve ? ?Medication Management: RN will administer medications as ordered by provider, will assess and evaluate patient's response and provide education to patient for prescribed medication. RN will report any adverse and/or side effects to prescribing provider. ? ?Therapeutic Interventions: 1 on 1 counseling sessions, Psychoeducation, Medication administration, Evaluate responses to treatment, Monitor vital signs and CBGs as ordered, Perform/monitor CIWA, COWS, AIMS and Fall Risk screenings as ordered, Perform wound care treatments as ordered. ? ?Evaluation of Outcomes: Progressing ? ? ?  LCSW Treatment Plan for Primary Diagnosis: Alcohol use disorder, severe, dependence (HCC) ?Long Term Goal(s): Safe transition to appropriate next level of care at discharge, Engage patient in therapeutic group addressing interpersonal concerns. ? ?Short Term Goals: Engage patient in aftercare planning with referrals and resources, Increase social support, Increase ability to appropriately verbalize feelings, Increase emotional regulation, Facilitate acceptance of mental health diagnosis and concerns, Facilitate patient progression through stages of change  regarding substance use diagnoses and concerns, Identify triggers associated with mental health/substance abuse issues, and Increase skills for wellness and recovery ? ?Therapeutic Interventions: Assess for all discharge needs, 1 to 1 ti

## 2021-07-23 NOTE — Plan of Care (Signed)
?  Problem: Activity: ?Goal: Interest or engagement in leisure activities will improve ?Outcome: Progressing ?  ?Problem: Coping: ?Goal: Coping ability will improve ?Outcome: Progressing ?  ?Problem: Education: ?Goal: Ability to state activities that reduce stress will improve ?Outcome: Progressing ?  ?Problem: Self-Concept: ?Goal: Level of anxiety will decrease ?Outcome: Progressing ?  ?

## 2021-07-23 NOTE — Progress Notes (Signed)
?   07/23/21 0915  ?Psych Admission Type (Psych Patients Only)  ?Admission Status Involuntary  ?Psychosocial Assessment  ?Patient Complaints Anxiety  ?Eye Contact Fair  ?Facial Expression Animated  ?Affect Appropriate to circumstance  ?Speech Logical/coherent  ?Interaction Assertive  ?Motor Activity Other (Comment) ?(WNL)  ?Appearance/Hygiene Unremarkable  ?Behavior Characteristics Cooperative;Calm  ?Mood Pleasant;Euthymic  ?Thought Process  ?Coherency WDL  ?Content WDL  ?Delusions None reported or observed  ?Perception WDL  ?Hallucination None reported or observed  ?Judgment Poor  ?Confusion None  ?Danger to Self  ?Current suicidal ideation? Denies  ?Danger to Others  ?Danger to Others None reported or observed  ? ? ?

## 2021-07-23 NOTE — Progress Notes (Signed)
[facility] MD Progress Note ? ?07/23/2021 8:37 AM ?Zachary [name]  ?MRN:  [id number] ?Reason for admission:  This is one of several psychiatric admission in this [facility] for this 61 year old Caucasian male with hx of alcoholism & other mental health issues.  He is here after a suicidal attempt by cutting his wrist.   ? ?Subjective :patient is seen today.  Patient states his mood was good until he got a distressing news on phone.  He reports that he got a phone call from his director who told him that if patient does not pick up his possessions and dogs until Monday then all of his possessions will be burned and dogs will be given away.  He reports that he is threatening him.  He is stressed out because of this news.  He plans to stay with his sister after discharge.  He states that his sister works at a [employer] and currently also taking care of his dad who has dementia.  He is requesting for discharge before Monday.  He slept well last night and reports stable appetite.  Currently, he denies any active or passive suicidal ideations, homicidal ideations, auditory and visual hallucinations.  He has been tolerating his medications without any side effects.  He denies any withdrawal symptoms or cravings.  He reports that he is not interested in rehab as he has been clean for 3 years before he will relapsed.  He reports that he can control and just has to remind himself not to drink.  He reports that he was discharged on Zoloft last time but he took it for few weeks and then stopped as it was not working.  Discussed that SSRIs usually takes 4 to 6 weeks to have full effect.  He is okay to start Zoloft or any other SSRI again.  He reports mild headache but denies any other physical symptoms.  He denies any other concerns. ?With attending, patient had lot of ideas and plans for future with some concerns of hypomania.  ?Tried to call sister Lucrezia Europe for collateral [phone] -no answer, straight going to voice mail.  Left  message ?Principal Problem: Alcohol use disorder, severe, dependence ([place]) ?Diagnosis: Principal Problem: ?  Alcohol use disorder, severe, dependence ([place]) ?Active Problems: ?  MDD (major depressive disorder), recurrent episode, severe ([place]) ? ?Total Time spent with patient: 35 minutes ? ?Past Psychiatric History: see H&P ? ?Past Medical History:  ?Past Medical History:  ?Diagnosis Date  ? Alcohol abuse   ? COPD (chronic obstructive pulmonary disease) ([place])   ? Depression   ? Hypertension   ?  ?Past Surgical History:  ?Procedure Laterality Date  ? COLONOSCOPY    ? MOUTH SURGERY    ? NO PAST SURGERIES    ? ?Family History:  ?Family History  ?Problem Relation Age of Onset  ? Heart attack Paternal Grandfather   ? Stroke Mother   ? Heart failure Mother   ? Heart failure Father   ? ?Family Psychiatric  History: see H&P ?Social History:  ?Social History  ? ?Substance and Sexual Activity  ?Alcohol Use Yes  ? Comment: BAC was .23 on admission  ?   ?Social History  ? ?Substance and Sexual Activity  ?Drug Use Yes  ? Types: Marijuana  ? Comment: UDS was negative  ?  ?Social History  ? ?Socioeconomic History  ? Marital status: Divorced  ?  Spouse name: Not on file  ? Number of children: 2  ? Years of  education: Not on file  ? Highest education level: Not on file  ?Occupational History  ? Occupation: Clinical biochemist  ?Tobacco Use  ? Smoking status: Some Days  ?  Packs/day: 2.00  ?  Types: Cigarettes  ? Smokeless tobacco: Never  ?Vaping Use  ? Vaping Use: Not on file  ?Substance and Sexual Activity  ? Alcohol use: Yes  ?  Comment: BAC was .23 on admission  ? Drug use: Yes  ?  Types: Marijuana  ?  Comment: UDS was negative  ? Sexual activity: Not Currently  ?Other Topics Concern  ? Not on file  ?Social History Narrative  ? Pt stated that he lives alone; works as a Clinical biochemist  ? ?Social Determinants of Health  ? ?Financial Resource Strain: Not on file  ?Food Insecurity: Not on file  ?Transportation Needs: Not on file   ?Physical Activity: Not on file  ?Stress: Not on file  ?Social Connections: Not on file  ? ?Additional Social History:  ?  ?  ?  ?  ?  ?  ?  ?  ?  ?  ?  ? ?Sleep: Good ? ?Appetite:  Good ? ?Current Medications: ?Current Facility-Administered Medications  ?Medication Dose Route Frequency Provider Last Rate Last Admin  ? acetaminophen (TYLENOL) tablet 1,000 mg  1,000 mg Oral Q6H PRN Lindon Romp A, NP      ? albuterol (VENTOLIN HFA) 108 (90 Base) MCG/ACT inhaler 1-2 puff  1-2 puff Inhalation Q6H PRN Lindell Spar I, NP      ? alum & mag hydroxide-simeth (MAALOX/MYLANTA) 200-200-20 MG/5ML suspension 30 mL  30 mL Oral Q4H PRN Lindon Romp A, NP      ? gabapentin (NEURONTIN) capsule 300 mg  300 mg Oral TID Lindell Spar I, NP   300 mg at 07/23/21 0805  ? lisinopril (ZESTRIL) tablet 10 mg  10 mg Oral Daily Lindon Romp A, NP   10 mg at 07/23/21 0805  ? And  ? hydrochlorothiazide (HYDRODIURIL) tablet 12.5 mg  12.5 mg Oral Daily Lindon Romp A, NP   12.5 mg at 07/23/21 [id number]  ? hydrOXYzine (ATARAX) tablet 25 mg  25 mg Oral Q6H PRN Lindon Romp A, NP      ? loperamide (IMODIUM) capsule 2-4 mg  2-4 mg Oral PRN Rozetta Nunnery, NP      ? LORazepam (ATIVAN) tablet 1 mg  1 mg Oral Q6H PRN Lindon Romp A, NP      ? magnesium hydroxide (MILK OF MAGNESIA) suspension 30 mL  30 mL Oral Daily PRN Rozetta Nunnery, NP      ? multivitamin with minerals tablet 1 tablet  1 tablet Oral Daily Lindon Romp A, NP   1 tablet at 07/23/21 0805  ? pantoprazole (PROTONIX) EC tablet 40 mg  40 mg Oral Daily Lindon Romp A, NP   40 mg at 07/23/21 0805  ? thiamine tablet 100 mg  100 mg Oral Daily Lindon Romp A, NP   100 mg at 07/23/21 [id number]  ? ? ?Lab Results:  ?Results for orders placed or performed during the hospital encounter of 07/22/21 (from the past 48 hour(s))  ?Hemoglobin A1c     Status: None  ? Collection Time: 07/22/21  6:58 AM  ?Result Value Ref Range  ? Hgb A1c MFr Bld 4.9 4.8 - 5.6 %  ?  Comment: (NOTE) ?Pre diabetes:           5.7%-6.4% ? ?Diabetes:              >  6.4% ? ?Glycemic control for   <7.0% ?adults with diabetes ?  ? Mean Plasma Glucose 93.93 mg/dL  ?  Comment: Performed at [facility], [place] [address] [place], [place] [address]  ?Lipid panel     Status: Abnormal  ? Collection Time: 07/22/21  6:58 AM  ?Result Value Ref Range  ? Cholesterol 224 (H) 0 - 200 mg/dL  ? Triglycerides 173 (H) <150 mg/dL  ? HDL 95 >40 mg/dL  ? Total CHOL/HDL Ratio 2.4 RATIO  ? VLDL 35 0 - 40 mg/dL  ? LDL Cholesterol 94 0 - 99 mg/dL  ?  Comment:        ?Total Cholesterol/HDL:CHD Risk ?Coronary Heart Disease Risk Table ?                    Men   Women ? 1/2 Average Risk   3.4   3.3 ? Average Risk       5.0   4.4 ? 2 X Average Risk   9.6   7.1 ? 3 X Average Risk  23.4   11.0 ?       ?Use the calculated Patient Ratio ?above and the CHD Risk Table ?to determine the patient's CHD Risk. ?       ?ATP III CLASSIFICATION (LDL): ? <100     mg/dL   Optimal ? 100-129  mg/dL   Near or Above ?                   Optimal ? 130-159  mg/dL   Borderline ? 160-189  mg/dL   High ? >190     mg/dL   Very High ?Performed at [facility], [place] [address], [place], [place] [address] ?  ?TSH     Status: None  ? Collection Time: 07/22/21  6:58 AM  ?Result Value Ref Range  ? TSH 2.302 0.350 - 4.500 uIU/mL  ?  Comment: Performed by a 3rd Generation assay with a functional sensitivity of <=0.01 uIU/mL. ?Performed at [facility], [place] [address] [place],  [address] ?  ? ? ?Blood Alcohol level:  ?Lab Results  ?Component Value Date  ? ETH 33 (H) 07/19/2021  ? ETH 177 (H) 12/08/2019  ? ? ?Metabolic Disorder Labs: ?Lab Results  ?Component Value Date  ? HGBA1C 4.9 07/22/2021  ? MPG 93.93 07/22/2021  ? MPG 105 10/28/2015  ? ?No results found for: PROLACTIN ?Lab Results  ?Component Value Date  ? CHOL 224 (H) 07/22/2021  ? TRIG 173 (H) 07/22/2021  ? HDL 95 07/22/2021  ? CHOLHDL 2.4 07/22/2021  ? VLDL 35 07/22/2021  ? [place] 94 07/22/2021  ?  LDLCALC 184 (H) 10/29/2015  ? ? ?Physical Findings: ?AIMS:  , ,  ,  ,    ?CIWA:  CIWA-Ar Total: 0 ?COWS:    ? ?Musculoskeletal: ?Strength & Muscle Tone: within normal limits ?Gait & Station: normal ?Patient leans: N/A ?

## 2021-07-23 NOTE — Group Note (Signed)
[place] LCSW Group Therapy Note ? ? ?Group Date: 07/23/2021 ?Start Time: 1300 ?End Time: 1400 ? ?Type of Therapy/Topic:  Group Therapy:  Emotion Regulation ? ?Participation Level:  Active  ? ?Mood: Surprised   ? ?Description of Group:   ? The purpose of this group is to assist patients in learning to regulate negative emotions and experience positive emotions. Patients will be guided to discuss ways in which they have been vulnerable to their negative emotions. These vulnerabilities will be juxtaposed with experiences of positive emotions or situations, and patients challenged to use positive emotions to combat negative ones. Special emphasis will be placed on coping with negative emotions in conflict situations, and patients will process healthy conflict resolution skills. ? ?Therapeutic Goals: ?Patient will identify two positive emotions or experiences to reflect on in order to balance out negative emotions:  ?Patient will label two or more emotions that they find the most difficult to experience:  ?Patient will be able to demonstrate positive conflict resolution skills through discussion or role plays:  ? ?Summary of Patient Progress:  The Pt attended group and remained there the entire time.  The Pt accepted all handouts and worksheets that were provided.  The Pt participated in the discussion, asked questions, and was appropriate with peers.  The Pt demonstrated knowledge of the topic being discussed and was able to elaborate on how this topic could be helpful to their recovery. ? ?Therapeutic Modalities:   ?Cognitive Behavioral Therapy ?Feelings Identification ?Dialectical Behavioral Therapy ? ? ?Darleen Crocker, LCSWA ?

## 2021-07-23 NOTE — Group Note (Signed)
Recreation Therapy Group Note ? ? ?Group Topic:Stress Management  ?Group Date: 07/23/2021 ?Start Time: 0932 ?End Time: 0950 ?Facilitators: Caroll Rancher, LRT,CTRS ?Location: [address] Day[address] ? ? ?Goal Area(s) Addresses:  ?Patient will actively participate in stress management techniques presented during session.  ?Patient will successfully identify benefit of practicing stress management post d/c.  ?  ?Group Description:  Guided Imagery. LRT provided education, instruction, and demonstration on practice of visualization via guided imagery. Patient was asked to participate in the technique introduced during session. LRT debriefed including topics of mindfulness, stress management and specific scenarios each patient could use these techniques. Patients were given suggestions of ways to access scripts post d/c and encouraged to explore Youtube and other apps available on smartphones, tablets, and computers. ? ? ?Affect/Mood: Appropriate ?  ?Participation Level: Engaged ?  ?Participation Quality: Independent ?  ?Behavior: Appropriate ?  ?Speech/Thought Process: Focused ?  ?Insight: Good ?  ?Judgement: Good ?  ?Modes of Intervention: Script, Ashby Dawes Sounds ?  ?Patient Response to Interventions:  Engaged ?  ?Education Outcome: ? Acknowledges education and In group clarification offered   ? ?Clinical Observations/Individualized Feedback: Pt actively participated in group.  Pt expressed no concerns at conclusion of group.  ? ? ?Plan: Continue to engage patient in RT group sessions 2-3x/week. ? ? ?Caroll Rancher, LRT,CTRS ?07/23/2021 12:07 PM ?

## 2021-07-24 DIAGNOSIS — F1020 Alcohol dependence, uncomplicated: Secondary | ICD-10-CM

## 2021-07-24 MED ORDER — GABAPENTIN 300 MG PO CAPS: 300.0000 mg | ORAL_CAPSULE | Freq: Three times a day (TID) | ORAL | 0 refills | Status: DC

## 2021-07-24 MED ORDER — THIAMINE HCL 100 MG PO TABS: 100.0000 mg | ORAL_TABLET | Freq: Every day | ORAL | 0 refills | Status: DC

## 2021-07-24 MED ORDER — ADULT MULTIVITAMIN W/MINERALS CH: 1.0000 | ORAL_TABLET | Freq: Every day | ORAL | Status: DC

## 2021-07-24 MED ORDER — PANTOPRAZOLE SODIUM 40 MG PO TBEC: 40.0000 mg | DELAYED_RELEASE_TABLET | Freq: Every day | ORAL | 0 refills | Status: DC

## 2021-07-24 NOTE — Discharge Summary (Addendum)
Physician Discharge Summary Note ? ?Patient:  Zachary [name] is an 61 y.o., male ?MRN:  [id number] ?DOB:  1960-07-04 ?Patient phone:  [phone] (home)  ?Patient address:   ?[address] ?[place] [address],  ?Total Time spent with patient: 45 minutes ? ?Date of Admission:  07/22/2021 ?Date of Discharge: 07/24/2021 ? ?Reason for Admission:  Zachary [name] is a 61 y.o. male with major depressive disorder and alcohol use disorder. Per admission H&P "This is one of several psychiatric admission in this [facility] for this 61 year old Caucasian male with hx of alcoholism & other mental health issues. He is know in this [facility] from his previous admissions for alcohol intoxications, suicidal ideations or attempt & worsening symptoms of depression/anxiety/ Patient was last admitted/treated in the [facility] in April of 2020. After stabilization, he was discharged with medications & a referral for further psychiatric services. Patient has also been to other psychiatric hospitals with the area with same complaints. He is being admitted to the [facility] this time around with complaint of suicide attempt by self-inflicted lacerations to his inner left wrist. He received 10 stitches at the ED. He was also intoxicated with a BAL of 33 upon his arrival at the ED." ? ?Principal Problem: Alcohol use disorder, severe, dependence (HCC) ?Discharge Diagnoses: Principal Problem: ?  Alcohol use disorder, severe, dependence (HCC) ?Active Problems: ?  MDD (major depressive disorder), recurrent episode, severe (HCC) ? ? ? ?Past Psychiatric History: MDD, Alcohol Use Disorder ? ?Past Medical History:  ?Past Medical History:  ?Diagnosis Date  ? Alcohol abuse   ? COPD (chronic obstructive pulmonary disease) (HCC)   ? Depression   ? Hypertension   ?  ?Past Surgical History:  ?Procedure Laterality Date  ? COLONOSCOPY    ? MOUTH SURGERY    ? NO PAST SURGERIES    ? ?Family History:  ?Family History  ?Problem Relation Age of Onset  ? Heart attack Paternal  Grandfather   ? Stroke Mother   ? Heart failure Mother   ? Heart failure Father   ? ?Family Psychiatric  History: see H&P ?Social History:  ?Social History  ? ?Substance and Sexual Activity  ?Alcohol Use Yes  ? Comment: BAC was .23 on admission  ?   ?Social History  ? ?Substance and Sexual Activity  ?Drug Use Yes  ? Types: Marijuana  ? Comment: UDS was negative  ?  ?Social History  ? ?Socioeconomic History  ? Marital status: Divorced  ?  Spouse name: Not on file  ? Number of children: 2  ? Years of education: Not on file  ? Highest education level: Not on file  ?Occupational History  ? Occupation: Product/process development scientist  ?Tobacco Use  ? Smoking status: Some Days  ?  Packs/day: 2.00  ?  Types: Cigarettes  ? Smokeless tobacco: Never  ?Vaping Use  ? Vaping Use: Not on file  ?Substance and Sexual Activity  ? Alcohol use: Yes  ?  Comment: BAC was .23 on admission  ? Drug use: Yes  ?  Types: Marijuana  ?  Comment: UDS was negative  ? Sexual activity: Not Currently  ?Other Topics Concern  ? Not on file  ?Social History Narrative  ? Pt stated that he lives alone; works as a Product/process development scientist  ? ?Social Determinants of Health  ? ?Financial Resource Strain: Not on file  ?Food Insecurity: Not on file  ?Transportation Needs: Not on file  ?Physical Activity: Not on file  ?Stress: Not on  file  ?Social Connections: Not on file  ? ? ?Hospital Course:  During the patient's hospitalization, patient had extensive initial psychiatric evaluation, and follow-up psychiatric evaluations every day. ?  ?Psychiatric diagnoses provided upon initial assessment:  ?Alcohol Use Disorder ?MDD, recurrent, severe ?  ?Patient's psychiatric medications were adjusted on admission:  ?-Resume Gabapentin 300 TID for alcohol cravings ?  ?During the hospitalization, other adjustments were made to the patient's psychiatric medication regimen:  ?-Continued Gabapentin 300 TID ?-Holding off on SSRI due to concerns for possible hypomania ?  ?Gradually, patient  started adjusting to milieu.   ?Patient's care was discussed during the interdisciplinary team meeting every day during the hospitalization. ?  ?The patient denies having side effects to prescribed psychiatric medication. ?  ?The patient reports their target psychiatric symptoms of alcohol cravings responded well to the psychiatric medications, and the patient reports overall benefit other psychiatric hospitalization. Supportive psychotherapy was provided to the patient. The patient also participated in regular group therapy while admitted.  ?  ?Labs were reviewed with the patient, and abnormal results were discussed with the patient. ?  ?The patient denied having suicidal thoughts more than 48 hours prior to discharge.  Patient denies having homicidal thoughts.  Patient denies having auditory hallucinations.  Patient denies any visual hallucinations.  Patient denies having paranoid thoughts. ?  ?The patient is able to verbalize their individual safety plan to this provider. ?  ?It is recommended to the patient to continue psychiatric medications as prescribed, after discharge from the hospital.   ?  ?It is recommended to the patient to follow up with your outpatient psychiatric provider and PCP. ?  ?Discussed with the patient, the impact of alcohol, drugs, tobacco have been there overall psychiatric and medical wellbeing, and total abstinence from substance use was recommended the patient. ? ?On day of discharge team spoke with his sister who felt he was at clinical baseline and expressed no safety concerns with patient returning home. ? ? ?Physical Findings: ?CIWA:  CIWA-Ar Total: 0 ? ?Musculoskeletal: ?Strength & Muscle Tone: within normal limits ?Gait & Station: normal ?Patient leans: N/A ? ? ?Psychiatric Specialty Exam: ? ?Presentation  ?General Appearance: Casual ? ? ?Eye Contact:Good ? ? ?Speech:Clear and Coherent; Normal Rate ? ? ?Speech Volume:Normal ? ? ?Handedness:Right ? ? ? ?Mood and Affect   ?Mood:euthymic ? ? ?Affect:brighter, congruent ? ? ? ?Thought Process  ?Thought Processes:Coherent ? ? ?Descriptions of Associations:Intact ? ? ?Orientation:Full (Time, Place and Person) ? ? ?Thought Content:Logical ? ? ?History of Schizophrenia/Schizoaffective disorder:No ? ? ?Duration of Psychotic Symptoms:No data recorded ? ?Hallucinations:Hallucinations: None ? ? ?Ideas of Reference:None ? ? ?Suicidal Thoughts:Suicidal Thoughts: No ? ? ?Homicidal Thoughts:Homicidal Thoughts: No ? ? ? ?Sensorium  ?Memory:Immediate Good; Recent Good; Remote Good ? ? ?Judgment:Fair ? ? ?Insight:Fair ? ? ? ?Executive Functions  ?Concentration:Good ? ? ?Attention Span:Good ? ? ?Recall:Good ? ? ?Fund of Knowledge:Good ? ? ?Language:Good ? ? ? ?Psychomotor Activity  ?Psychomotor Activity:Psychomotor Activity: Normal ? ? ? ?Assets  ?Assets:Communication Skills; Social Support ? ? ? ?Sleep  ?Sleep:Sleep: Good ?Number of Hours of Sleep: 0 (Not charted) ? ? ? ? ?Physical Exam: ?Physical Exam ?Vitals reviewed.  ?HENT:  ?   Head: Normocephalic.  ?Pulmonary:  ?   Effort: Pulmonary effort is normal.  ?Neurological:  ?   General: No focal deficit present.  ?   Mental Status: He is alert.  ? ?Review of Systems  ?Respiratory:  Negative for shortness of breath.   ?  Cardiovascular:  Negative for chest pain.  ?Gastrointestinal:  Negative for diarrhea, nausea and vomiting.  ?Blood pressure 123/75, pulse 66, temperature 98.3 ?F (36.8 ?C), temperature source Oral, resp. rate 18, height 5\' 10"  (1.778 m), weight 72.6 kg, SpO2 92 %. Body mass index is 22.96 kg/m?. ? ? ?Social History  ? ?Tobacco Use  ?Smoking Status Some Days  ? Packs/day: 2.00  ? Types: Cigarettes  ?Smokeless Tobacco Never  ? ?Tobacco Cessation:  N/A, patient does not currently use tobacco products ? ? ?Blood Alcohol level:  ?Lab Results  ?Component Value Date  ? ETH 33 (H) 07/19/2021  ? ETH 177 (H) 12/08/2019  ? ? ?Metabolic Disorder Labs:  ?Lab Results  ?Component Value Date  ? HGBA1C  4.9 07/22/2021  ? MPG 93.93 07/22/2021  ? MPG 105 10/28/2015  ? ?No results found for: PROLACTIN ?Lab Results  ?Component Value Date  ? CHOL 224 (H) 07/22/2021  ? TRIG 173 (H) 07/22/2021  ? HDL 95 07/22/2021  ? CHOLHDL 2

## 2021-07-24 NOTE — Progress Notes (Signed)
?   07/23/21 2215  ?Psych Admission Type (Psych Patients Only)  ?Admission Status Involuntary  ?Psychosocial Assessment  ?Patient Complaints None  ?Eye Contact Fair  ?Facial Expression Flat  ?Affect Appropriate to circumstance  ?Speech Logical/coherent  ?Interaction Assertive  ?Motor Activity Slow  ?Appearance/Hygiene Unremarkable  ?Behavior Characteristics Calm  ?Mood Pleasant  ?Thought Process  ?Coherency WDL  ?Content WDL  ?Delusions None reported or observed  ?Perception WDL  ?Hallucination None reported or observed  ?Judgment Poor  ?Confusion None  ?Danger to Self  ?Current suicidal ideation? Denies  ?Danger to Others  ?Danger to Others None reported or observed  ? ? ?

## 2021-07-24 NOTE — [facility] Suicide Risk Assessment (Addendum)
[facility] Discharge Suicide Risk Assessment ? ? ?Principal Problem: Alcohol use disorder, severe, dependence (HCC) ?Discharge Diagnoses: Principal Problem: ?  Alcohol use disorder, severe, dependence (HCC) ?Active Problems: ?  MDD (major depressive disorder), recurrent episode, severe (HCC) ? ? ?Hospital Summary ?During the patient's hospitalization, patient had extensive initial psychiatric evaluation, and follow-up psychiatric evaluations every day. ? ?Psychiatric diagnoses provided upon initial assessment:  ?Alcohol Use Disorder ?MDD, recurrent, severe ? ?Patient's psychiatric medications were adjusted on admission:  ?-Resume Gabapentin 300 TID for alcohol cravings ? ?During the hospitalization, other adjustments were made to the patient's psychiatric medication regimen:  ?-Continued Gabapentin 300 TID ?-Holding off on SSRI due to concerns for possible hypomania ? ?Gradually, patient started adjusting to milieu.   ?Patient's care was discussed during the interdisciplinary team meeting every day during the hospitalization. ? ?The patient dnies having side effects to prescribed psychiatric medication. ? ?The patient reports their target psychiatric symptoms of alcohol cravings responded well to the psychiatric medications, and the patient reports overall benefit other psychiatric hospitalization. Supportive psychotherapy was provided to the patient. The patient also participated in regular group therapy while admitted.  ? ?Labs were reviewed with the patient, and abnormal results were discussed with the patient. ? ?The patient denied having suicidal thoughts more than 48 hours prior to discharge.  Patient denies having homicidal thoughts.  Patient denies having auditory hallucinations.  Patient denies any visual hallucinations.  Patient denies having paranoid thoughts. ? ?The patient is able to verbalize their individual safety plan to this provider. ? ?It is recommended to the patient to continue psychiatric medications  as prescribed, after discharge from the hospital.   ? ?It is recommended to the patient to follow up with your outpatient psychiatric provider and PCP. ? ?Discussed with the patient, the impact of alcohol, drugs, tobacco have been there overall psychiatric and medical wellbeing, and total abstinence from substance use was recommended the patient. ? ? ?Total Time spent with patient: 45 minutes ? ?Musculoskeletal: ?Strength & Muscle Tone: within normal limits ?Gait & Station: normal ?Patient leans: N/A ? ?Psychiatric Specialty Exam ? ?Presentation  ?General Appearance: Casual ? ? ?Eye Contact:Good ? ? ?Speech:Clear and Coherent; Normal Rate ? ? ?Speech Volume:Normal ? ? ?Handedness:Right ? ? ? ?Mood and Affect  ?Mood:Anxious ? ? ?Duration of Depression Symptoms: Less than two weeks ? ? ?Affect:Congruent; Constricted ? ? ? ?Thought Process  ?Thought Processes:Coherent ? ? ?Descriptions of Associations:Intact ? ? ?Orientation:Full (Time, Place and Person) ? ? ?Thought Content:Logical ? ? ?History of Schizophrenia/Schizoaffective disorder:No ? ? ?Duration of Psychotic Symptoms:No data recorded ? ?Hallucinations:Hallucinations: None ? ?Ideas of Reference:None ? ? ?Suicidal Thoughts:Suicidal Thoughts: No ? ?Homicidal Thoughts:Homicidal Thoughts: No ? ? ?Sensorium  ?Memory:Immediate Good; Recent Good; Remote Good ? ? ?Judgment:Fair ? ? ?Insight:Fair ? ? ? ?Executive Functions  ?Concentration:Good ? ? ?Attention Span:Good ? ? ?Recall:Good ? ? ?Fund of Knowledge:Good ? ? ?Language:Good ? ? ? ?Psychomotor Activity  ?Psychomotor Activity:Psychomotor Activity: Normal ? ? ?Assets  ?Assets:Communication Skills; Social Support ? ? ? ?Sleep  ?Sleep:Sleep: Good ?Number of Hours of Sleep: 0 (Not charted) ? ? ?Physical Exam: ?Physical Exam ?Vitals and nursing note reviewed.  ?Constitutional:   ?   Appearance: Normal appearance. He is normal weight.  ?HENT:  ?   Head: Normocephalic and atraumatic.  ?Pulmonary:  ?   Effort: Pulmonary  effort is normal.  ?Neurological:  ?   General: No focal deficit present.  ?   Mental Status: He is oriented to  person, place, and time.  ? ?Review of Systems  ?Respiratory:  Negative for shortness of breath.   ?Cardiovascular:  Negative for chest pain.  ?Gastrointestinal:  Negative for abdominal pain, constipation, diarrhea, heartburn, nausea and vomiting.  ?Neurological:  Negative for headaches.  ?Blood pressure 123/75, pulse 66, temperature 98.3 ?F (36.8 ?C), temperature source Oral, resp. rate 18, height 5\' 10"  (1.778 m), weight 72.6 kg, SpO2 92 %. Body mass index is 22.96 kg/m?. ? ?Mental Status Per Nursing Assessment::   ?On Admission:  NA ? ?Demographic Factors:  ?Living alone and Unemployed ? ?Loss Factors: ?Decrease in vocational status ? ?Historical Factors: ?Impulsivity ? ?Risk Reduction Factors:   ?Religious beliefs about death, Positive social support, and Positive coping skills or problem solving skills ? ?Continued Clinical Symptoms:  ?Depression:   Comorbid alcohol abuse/dependence ? ?Cognitive Features That Contribute To Risk:  ?None   ? ?Suicide Risk:  ?Mild:  There are no identifiable plans, no associated intent, mild dysphoria and related symptoms, good self-control (both objective and subjective assessment), few other risk factors, and identifiable protective factors, including available and accessible social support. ? ? Follow-up Information   ? ? [facility] Follow up on 07/27/2021.   ?Why: You are scheduled for an appointment on 07/27/2021 at 8:30am for an intake assessment to begin therapy and medication management services.  This appointment will be held in-person. ?Contact information: ?[address] ?[address] [place] ?[phone] ? ? ?  ?  ? ?  ?  ? ?  ? ? ?Plan Of Care/Follow-up recommendations:  ?Activity: as tolerated ? ?Diet: heart healthy ? ?Other: ?-Follow-up with your outpatient psychiatric provider -instructions on appointment date, time, and address  (location) are provided to you in discharge paperwork. ? ?-Take your psychiatric medications as prescribed at discharge - instructions are provided to you in the discharge paperwork ? ?-Follow-up with outpatient primary care doctor and other specialists -for management of chronic medical disease, including: COPD, Hypertension, GERD, Alcohol Use ? ?-Testing: Follow-up with outpatient provider for abnormal lab results: High Cholesterol (224) ? ?-Recommend abstinence from alcohol, tobacco, and other illicit drug use at discharge.  ? ?-If your psychiatric symptoms recur, worsen, or if you have side effects to your psychiatric medications, call your outpatient psychiatric provider, 911, [phone] or go to the nearest emergency department. ? ?-If suicidal thoughts recur, call your outpatient psychiatric provider, 911, [phone] or go to the nearest emergency department. ? ? ?[phone], MD ?07/24/2021, 10:11 AM ?

## 2021-07-24 NOTE — Progress Notes (Signed)
?  [facility] Adult Case Management Discharge Plan : ? ?Will you be returning to the same living situation after discharge:  Yes,  sister ?At discharge, do you have transportation home?: No.  Taxi voucher provided ?Do you have the ability to pay for your medications: Yes,  Medicaid ? ?Release of information consent forms completed and emailed to Medical Records, then turned in to Medical Records by CSW.  ? ?Patient to Follow up at: ? Follow-up Information   ? ? [facility] Follow up on 07/27/2021.   ?Why: You are scheduled for an appointment on 07/27/2021 at 8:30am for an intake assessment to begin therapy and medication management services.  This appointment will be held in-person. ?Contact information: ?[address] ?[place] [address] ?[phone] ? ? ?  ?  ? ?  ?  ? ?  ? ? ?Next level of care provider has access to [facility] Link:no ? ?Safety Planning and Suicide Prevention discussed: Yes,  with sister ? ?  ? ?Has patient been referred to the Quitline?: Patient refused referral ? ?Patient has been referred for addiction treatment: Yes ? ?Lynnell Chad, LCSW ?07/24/2021, 11:20 AM ?

## 2021-07-24 NOTE — Progress Notes (Signed)
Patient discharged. Discharge instructions reviewed with patient. Patient verbalized understanding. Patient received all personal belongings. Patient received hard copy prescription for Gabapentin. At discharge patient denies SI/HI/AVH. Patient left unit at 1149.  ?

## 2021-07-24 NOTE — Progress Notes (Signed)
Spoke with Shephard,Joan (Sister) this morning. Sister has no safety concerns at this time. Sister denies any firearms or alcohol in patient's room. She does not think patient is at risk of self harm at this time. Sister reports that patient tends to have difficulty dealing with stressful situations but does feel that patient is currently at baseline. Sister reports no family history of bipolar disorder but also reports family is generally "high energy". Sister does report patient had "bad reaction" to Paxil and some other psychotropic that started with "S" but was unable to specify reaction. ? ?Park Pope, MD ?PGY1 Psychiatry Resident ?

## 2021-07-24 NOTE — Progress Notes (Signed)
?   07/24/21 0900  ?Psych Admission Type (Psych Patients Only)  ?Admission Status Involuntary  ?Psychosocial Assessment  ?Patient Complaints None  ?Eye Contact Fair  ?Facial Expression Flat  ?Affect Appropriate to circumstance  ?Speech Logical/coherent  ?Interaction Assertive  ?Motor Activity Slow  ?Appearance/Hygiene Unremarkable  ?Behavior Characteristics Calm  ?Mood Pleasant  ?Thought Process  ?Coherency WDL  ?Content WDL  ?Delusions None reported or observed  ?Perception WDL  ?Hallucination None reported or observed  ?Judgment Poor  ?Confusion None  ?Danger to Self  ?Current suicidal ideation? Denies  ?Danger to Others  ?Danger to Others None reported or observed  ? ? ?

## 2021-07-24 NOTE — Group Note (Signed)
LCSW Group Therapy Note ? ?07/24/2021   10:30-11:30am  ? ?Topic:  Anger and its Underlying Emotions ? ?Participation Level:  Active ? ?Description of Group:   ?In this group, patients identified the primary emotions they often have in situations that eventually provoke them to anger.  TFocus was placed on how helpful it is to recognize the underlying emotions to anger in order to address these for more permanent resolution.  Emphasis was also on identifying possible replacement thoughts for the automatic thoughts generated in various situations shared by the group. ? ?Therapeutic Goals: ?Patients will share emotions that commonly incite their anger and how they typically respond ?Patients will identify how their coping skills work for them and/or against them ?Patients will explore possible alternative thoughts to their automatic ones ?Patients will learn that anger itself is normal and that healthier reactions can assist with resolving conflict rather than worsening situations ? ?Summary of Patient Progress:  The patient was late to group but contributed once he did arrive.  Patient indicated a willingness to try working on the underlying emotions in order to feel better both in the short-term and in the long-run. ? ?Therapeutic Modalities:   ?Cognitive Behavioral Therapy ?Processing ? ?Lynnell Chad, MSW, LCSW  ?

## 2021-08-14 ENCOUNTER — Ambulatory Visit (HOSPITAL_COMMUNITY)
Admission: EM | Admit: 2021-08-14 | Discharge: 2021-08-14 | Disposition: A | Discharge status: Home / Self Care | Payer: Medicaid Other | Attending: Physician Assistant | Admitting: Physician Assistant

## 2021-08-14 ENCOUNTER — Encounter (HOSPITAL_COMMUNITY): Payer: Self-pay | Admitting: *Deleted

## 2021-08-14 ENCOUNTER — Other Ambulatory Visit: Payer: Self-pay

## 2021-08-14 DIAGNOSIS — F410 Panic disorder [episodic paroxysmal anxiety] without agoraphobia: Secondary | ICD-10-CM | POA: Diagnosis not present

## 2021-08-14 MED ORDER — CLONAZEPAM 1 MG PO TABS: 1.0000 mg | ORAL_TABLET | Freq: Three times a day (TID) | ORAL | 0 refills | Status: DC | PRN

## 2021-08-14 NOTE — ED Triage Notes (Signed)
Reports he is here for anxiety. Pt ran out of meds  in DEC.

## 2021-08-14 NOTE — Discharge Instructions (Signed)
I have called in a few doses of clonazepam to have on hand.  Please use these very sparingly as we discussed.  I do think it is importantly follow-up with the behavioral health urgent care.  We would not be able to give you any refills of this medication but they may have able to contact you with services to determine most appropriate ongoing treatment.  If you have any thoughts of suicide/self-harm you need to go to the emergency room as we discussed.

## 2021-08-14 NOTE — ED Provider Notes (Signed)
[facility]    CSN: [id number] Arrival date & time: 08/14/21  1044      History   Chief Complaint Chief Complaint  Patient presents with   Anxiety    HPI Zachary [name] is a 61 y.o. male.   Patient presents today for refill of clonazepam.  He has a prolonged history of PTSD and anxiety and has tried numerous medications in the past without success.  He was taking clonazepam 1 mg 3 times daily and found that this was the best medication to manage his symptoms.  He has been without this medication since January 2023.  It was last refilled by our clinic December 2022.  He has a prolonged history of anxiety depression with several hospitalizations.  He was most recently hospitalized Jul 18, 2021 after attempted suicide following increased alcohol consumption.  He has been without alcohol since Jul 18, 2021.  He reports significant anxiety as he has several life stressors including starting a new job as well as taking care of his elderly father and is already feeling as though he is having difficulty managing these experiences.  He has been given resources when he was discharged from behavioral [facility] but is not yet established with mental health provider.  He denies any current thoughts of suicide/self-harm or wanting to harm someone else.   Past Medical History:  Diagnosis Date   Alcohol abuse    COPD (chronic obstructive pulmonary disease) (HCC)    Depression    Hypertension     Patient Active Problem List   Diagnosis Date Noted   MDD (major depressive disorder), recurrent episode, severe (HCC) 07/22/2021   Suicide and self-inflicted injury by cutting and piercing instrument (HCC) 07/21/2021   Alcohol use disorder 07/21/2021   Alcohol-induced mood disorder with depressive symptoms (HCC) 07/21/2021   High anion gap metabolic acidosis 12/09/2019   Lactic acidosis 12/09/2019   COPD (chronic obstructive pulmonary disease) (HCC)    Alcohol use disorder, severe,  dependence (HCC) 07/08/2018   Prolonged Q-T interval on ECG    Suicidal thoughts    MDD (major depressive disorder), recurrent severe, without psychosis (HCC) 12/30/2016   Hypertension 10/28/2015   Alcohol use disorder 10/28/2015   Hyperglycemia 10/28/2015   Tobacco abuse 10/28/2015    Past Surgical History:  Procedure Laterality Date   COLONOSCOPY     MOUTH SURGERY     NO PAST SURGERIES         Home Medications    Prior to Admission medications   Medication Sig Start Date End Date Taking? Authorizing Provider  clonazePAM (KLONOPIN) 1 MG tablet Take 1 tablet (1 mg total) by mouth 3 (three) times daily as needed for anxiety. 08/14/21  Yes Mannix Kroeker K, PA-C  albuterol (VENTOLIN HFA) 108 (90 Base) MCG/ACT inhaler Inhale 1-2 puffs into the lungs every 6 (six) hours as needed for wheezing or shortness of breath. 07/10/18   Malvin JohnsFarah, Brian, MD  cholecalciferol (VITAMIN D3) 25 MCG (1000 UT) tablet Take 1,000 Units by mouth daily. Patient not taking: Reported on 07/20/2021    [provider]  gabapentin (NEURONTIN) 300 MG capsule Take 1 capsule (300 mg total) by mouth 3 (three) times daily. 07/24/21 08/23/21  Park PopeJi, Andrew, MD  lisinopril-hydrochlorothiazide (ZESTORETIC) 10-12.5 MG tablet Take 1 tablet by mouth daily. Patient not taking: Reported on 07/20/2021 05/09/18   [provider]  Multiple Vitamin (MULTIVITAMIN WITH MINERALS) TABS tablet Take 1 tablet by mouth daily. 07/25/21   Park PopeJi, Andrew, MD  thiamine 100 MG tablet Take 1 tablet (100 mg total) by mouth daily. 07/25/21   Park Pope, MD    Family History Family History  Problem Relation Age of Onset   Heart attack Paternal Grandfather    Stroke Mother    Heart failure Mother    Heart failure Father     Social History Social History   Tobacco Use   Smoking status: Some Days    Packs/day: 2.00    Types: Cigarettes   Smokeless tobacco: Never  Substance Use Topics   Alcohol use: Yes    Comment: BAC was .23 on  admission   Drug use: Yes    Types: Marijuana    Comment: UDS was negative     Allergies   Penicillins and Statins   Review of Systems Review of Systems  Constitutional:  Positive for activity change and appetite change. Negative for fatigue and fever.  Gastrointestinal:  Negative for abdominal pain, diarrhea, nausea and vomiting.  Psychiatric/Behavioral:  Positive for agitation. Negative for confusion, self-injury, sleep disturbance and suicidal ideas. The patient is nervous/anxious.     Physical Exam Triage Vital Signs ED Triage Vitals  Enc Vitals Group     BP 08/14/21 1152 (!) 142/88     Pulse Rate 08/14/21 1152 (!) 57     Resp 08/14/21 1152 18     Temp 08/14/21 1152 98.1 F (36.7 C)     Temp src --      SpO2 08/14/21 1152 96 %     Weight --      Height --      Head Circumference --      Peak Flow --      Pain Score 08/14/21 1149 0     Pain Loc --      Pain Edu? --      Excl. in GC? --    No data found.  Updated Vital Signs BP (!) 142/88   Pulse (!) 57   Temp 98.1 F (36.7 C)   Resp 18   SpO2 96%   Visual Acuity Right Eye Distance:   Left Eye Distance:   Bilateral Distance:    Right Eye Near:   Left Eye Near:    Bilateral Near:     Physical Exam Vitals reviewed.  Constitutional:      General: He is awake.     Appearance: Normal appearance. He is well-developed. He is not ill-appearing.     Comments: Very pleasant male appears stated age in no acute distress sitting in exam room  HENT:     Head: Normocephalic and atraumatic.     Mouth/Throat:     Pharynx: No oropharyngeal exudate, posterior oropharyngeal erythema or uvula swelling.  Pulmonary:     Effort: Pulmonary effort is normal. No accessory muscle usage or respiratory distress.  Neurological:     Mental Status: He is alert.  Psychiatric:        Attention and Perception: Attention normal.        Mood and Affect: Mood is anxious.        Speech: Speech normal.        Behavior: Behavior is  cooperative.        Thought Content: Thought content is not paranoid. Thought content does not include homicidal or suicidal ideation.     UC Treatments / Results  Labs (all labs ordered are listed, but only abnormal results are displayed) Labs Reviewed - No data to display  EKG  Radiology No results found.  Procedures Procedures (including critical care time)  Medications Ordered in UC Medications - No data to display  Initial Impression / Assessment and Plan / UC Course  I have reviewed the triage vital signs and the nursing notes.  Pertinent labs & imaging results that were available during my care of the patient were reviewed by me and considered in my medical decision making (see chart for details).     Patient denies any current thoughts of suicide/self-harm and contracts for safety today.  Initially discussed that we typically do not prescribe benzodiazepines in an urgent care setting, however, patient reports that he is concerned about worsening symptoms and feels he needs medication and we have prescribed this in the past for him.  Discussed with Dr. Tracie Harrier who agreed it was reasonable for him to be given a short course of medication.  15 tablets of clonazepam sent to pharmacy.  Discussed that we would not be able to provide additional refills of this medication.  We discussed mental health services in the area including behavioral health urgent care and he was given a flyer with their information with instruction to follow-up with them to establish with mental health services for ongoing care.  Patient did agree that if he has any worsening symptoms he will go to the hospital and contracted for safety today.  Final Clinical Impressions(s) / UC Diagnoses   Final diagnoses:  Panic disorder without agoraphobia  Panic attack     Discharge Instructions      I have called in a few doses of clonazepam to have on hand.  Please use these very sparingly as we discussed.  I  do think it is importantly follow-up with the behavioral health urgent care.  We would not be able to give you any refills of this medication but they may have able to contact you with services to determine most appropriate ongoing treatment.  If you have any thoughts of suicide/self-harm you need to go to the emergency room as we discussed.     ED Prescriptions     Medication Sig Dispense Auth. Provider   clonazePAM (KLONOPIN) 1 MG tablet Take 1 tablet (1 mg total) by mouth 3 (three) times daily as needed for anxiety. 15 tablet Khaden Gater K, PA-C      I have reviewed the PDMP during this encounter.   Jeani Hawking, PA-C 08/14/21 1248

## 2021-09-01 ENCOUNTER — Encounter (HOSPITAL_COMMUNITY): Payer: Self-pay

## 2021-09-01 ENCOUNTER — Emergency Department (HOSPITAL_COMMUNITY)
Admission: EM | Admit: 2021-09-01 | Discharge: 2021-09-02 | Disposition: A | Discharge status: Home / Self Care | Payer: Medicaid Other | Attending: Emergency Medicine | Admitting: Emergency Medicine

## 2021-09-01 ENCOUNTER — Other Ambulatory Visit: Payer: Self-pay

## 2021-09-01 DIAGNOSIS — F10188 Alcohol abuse with other alcohol-induced disorder: Secondary | ICD-10-CM | POA: Insufficient documentation

## 2021-09-01 DIAGNOSIS — F332 Major depressive disorder, recurrent severe without psychotic features: Secondary | ICD-10-CM | POA: Diagnosis present

## 2021-09-01 DIAGNOSIS — J449 Chronic obstructive pulmonary disease, unspecified: Secondary | ICD-10-CM | POA: Insufficient documentation

## 2021-09-01 DIAGNOSIS — F17210 Nicotine dependence, cigarettes, uncomplicated: Secondary | ICD-10-CM | POA: Diagnosis not present

## 2021-09-01 DIAGNOSIS — I10 Essential (primary) hypertension: Secondary | ICD-10-CM | POA: Diagnosis not present

## 2021-09-01 DIAGNOSIS — F411 Generalized anxiety disorder: Secondary | ICD-10-CM | POA: Insufficient documentation

## 2021-09-01 DIAGNOSIS — F339 Major depressive disorder, recurrent, unspecified: Secondary | ICD-10-CM | POA: Insufficient documentation

## 2021-09-01 DIAGNOSIS — R45851 Suicidal ideations: Secondary | ICD-10-CM | POA: Diagnosis not present

## 2021-09-01 DIAGNOSIS — Z20822 Contact with and (suspected) exposure to covid-19: Secondary | ICD-10-CM | POA: Insufficient documentation

## 2021-09-01 DIAGNOSIS — F1094 Alcohol use, unspecified with alcohol-induced mood disorder: Secondary | ICD-10-CM | POA: Diagnosis present

## 2021-09-01 DIAGNOSIS — F331 Major depressive disorder, recurrent, moderate: Secondary | ICD-10-CM | POA: Diagnosis present

## 2021-09-01 DIAGNOSIS — F1010 Alcohol abuse, uncomplicated: Secondary | ICD-10-CM | POA: Diagnosis present

## 2021-09-01 DIAGNOSIS — R1084 Generalized abdominal pain: Secondary | ICD-10-CM | POA: Diagnosis not present

## 2021-09-01 LAB — RESP PANEL BY RT-PCR (FLU A&B, COVID) ARPGX2
Influenza A by PCR: NEGATIVE
Influenza B by PCR: NEGATIVE
SARS Coronavirus 2 by RT PCR: NEGATIVE

## 2021-09-01 LAB — CBC WITH DIFFERENTIAL/PLATELET
Abs Immature Granulocytes: 0.08 10*3/uL — ABNORMAL HIGH (ref 0.00–0.07)
Basophils Absolute: 0.1 10*3/uL (ref 0.0–0.1)
Basophils Relative: 1 %
Eosinophils Absolute: 0.2 10*3/uL (ref 0.0–0.5)
Eosinophils Relative: 3 %
HCT: 46.6 % (ref 39.0–52.0)
Hemoglobin: 16.7 g/dL (ref 13.0–17.0)
Immature Granulocytes: 1 %
Lymphocytes Relative: 33 %
Lymphs Abs: 2.4 10*3/uL (ref 0.7–4.0)
MCH: 32.9 pg (ref 26.0–34.0)
MCHC: 35.8 g/dL (ref 30.0–36.0)
MCV: 91.7 fL (ref 80.0–100.0)
Monocytes Absolute: 0.6 10*3/uL (ref 0.1–1.0)
Monocytes Relative: 9 %
Neutro Abs: 4.1 10*3/uL (ref 1.7–7.7)
Neutrophils Relative %: 53 %
Platelets: 184 10*3/uL (ref 150–400)
RBC: 5.08 MIL/uL (ref 4.22–5.81)
RDW: 12.3 % (ref 11.5–15.5)
WBC: 7.5 10*3/uL (ref 4.0–10.5)
nRBC: 0 % (ref 0.0–0.2)

## 2021-09-01 LAB — COMPREHENSIVE METABOLIC PANEL
ALT: 17 U/L (ref 0–44)
AST: 17 U/L (ref 15–41)
Albumin: 4.3 g/dL (ref 3.5–5.0)
Alkaline Phosphatase: 86 U/L (ref 38–126)
Anion gap: 11 (ref 5–15)
BUN: 5 mg/dL — ABNORMAL LOW (ref 8–23)
CO2: 21 mmol/L — ABNORMAL LOW (ref 22–32)
Calcium: 9.4 mg/dL (ref 8.9–10.3)
Chloride: 107 mmol/L (ref 98–111)
Creatinine, Ser: 0.82 mg/dL (ref 0.61–1.24)
GFR, Estimated: >60 mL/min (ref >60)
Glucose, Bld: 80 mg/dL (ref 70–99)
Potassium: 4 mmol/L (ref 3.5–5.1)
Sodium: 139 mmol/L (ref 135–145)
Total Bilirubin: 0.5 mg/dL (ref 0.3–1.2)
Total Protein: 7.1 g/dL (ref 6.5–8.1)

## 2021-09-01 LAB — ACETAMINOPHEN LEVEL: Acetaminophen (Tylenol), Serum: <10 ug/mL — ABNORMAL LOW (ref 10–30)

## 2021-09-01 LAB — RAPID URINE DRUG SCREEN, HOSP PERFORMED
Amphetamines: NOT DETECTED
Barbiturates: NOT DETECTED
Benzodiazepines: POSITIVE — AB
Cocaine: NOT DETECTED
Opiates: NOT DETECTED
Tetrahydrocannabinol: POSITIVE — AB

## 2021-09-01 LAB — SALICYLATE LEVEL: Salicylate Lvl: <7 mg/dL — ABNORMAL LOW (ref 7.0–30.0)

## 2021-09-01 LAB — ETHANOL: Alcohol, Ethyl (B): 154 mg/dL — ABNORMAL HIGH (ref <10)

## 2021-09-01 MED ORDER — LORAZEPAM 2 MG/ML IJ SOLN: 1.0000 mg | INTRAMUSCULAR | Status: DC | PRN

## 2021-09-01 MED ORDER — HYDROCHLOROTHIAZIDE 12.5 MG PO TABS
12.5000 mg | ORAL_TABLET | Freq: Every day | ORAL | Status: DC
  Administered 2021-09-01 – 2021-09-02 (×2): 12.5 mg via ORAL
  Filled 2021-09-01 (×2): qty 1

## 2021-09-01 MED ORDER — GABAPENTIN 300 MG PO CAPS
300.0000 mg | ORAL_CAPSULE | Freq: Three times a day (TID) | ORAL | Status: DC
  Administered 2021-09-01 – 2021-09-02 (×5): 300 mg via ORAL
  Filled 2021-09-01 (×5): qty 1

## 2021-09-01 MED ORDER — LORAZEPAM 1 MG PO TABS: 0.0000 mg | ORAL_TABLET | Freq: Two times a day (BID) | ORAL | Status: DC

## 2021-09-01 MED ORDER — THIAMINE HCL 100 MG PO TABS
100.0000 mg | ORAL_TABLET | Freq: Every day | ORAL | Status: DC
Start: 2021-09-01 — End: 2021-09-03
  Administered 2021-09-01 – 2021-09-02 (×2): 100 mg via ORAL
  Filled 2021-09-01 (×2): qty 1

## 2021-09-01 MED ORDER — DULOXETINE HCL 30 MG PO CPEP
30.0000 mg | ORAL_CAPSULE | Freq: Every day | ORAL | Status: DC
  Administered 2021-09-02: 30 mg via ORAL
  Filled 2021-09-01: qty 1

## 2021-09-01 MED ORDER — LISINOPRIL-HYDROCHLOROTHIAZIDE 10-12.5 MG PO TABS: 1.0000 | ORAL_TABLET | Freq: Every day | ORAL | Status: DC

## 2021-09-01 MED ORDER — LISINOPRIL 10 MG PO TABS
10.0000 mg | ORAL_TABLET | Freq: Every day | ORAL | Status: DC
  Administered 2021-09-01 – 2021-09-02 (×2): 10 mg via ORAL
  Filled 2021-09-01 (×2): qty 1

## 2021-09-01 MED ORDER — LORAZEPAM 1 MG PO TABS: 1.0000 mg | ORAL_TABLET | ORAL | Status: DC | PRN

## 2021-09-01 MED ORDER — NALTREXONE HCL 50 MG PO TABS
50.0000 mg | ORAL_TABLET | Freq: Every day | ORAL | Status: DC
  Administered 2021-09-01 – 2021-09-02 (×2): 50 mg via ORAL
  Filled 2021-09-01 (×2): qty 1

## 2021-09-01 MED ORDER — LORAZEPAM 1 MG PO TABS
0.0000 mg | ORAL_TABLET | Freq: Four times a day (QID) | ORAL | Status: DC
  Administered 2021-09-01 – 2021-09-02 (×4): 1 mg via ORAL
  Filled 2021-09-01 (×4): qty 1

## 2021-09-01 MED ORDER — FOLIC ACID 1 MG PO TABS
1.0000 mg | ORAL_TABLET | Freq: Every day | ORAL | Status: DC
  Administered 2021-09-01 – 2021-09-02 (×2): 1 mg via ORAL
  Filled 2021-09-01 (×2): qty 1

## 2021-09-01 MED ORDER — THIAMINE HCL 100 MG/ML IJ SOLN
100.0000 mg | Freq: Every day | INTRAMUSCULAR | Status: DC
Start: 2021-09-01 — End: 2021-09-03

## 2021-09-01 MED ORDER — PANTOPRAZOLE SODIUM 40 MG PO TBEC
40.0000 mg | DELAYED_RELEASE_TABLET | Freq: Every day | ORAL | Status: DC
  Administered 2021-09-01 – 2021-09-02 (×2): 40 mg via ORAL
  Filled 2021-09-01 (×2): qty 1

## 2021-09-01 MED ORDER — ADULT MULTIVITAMIN W/MINERALS CH
1.0000 | ORAL_TABLET | Freq: Every day | ORAL | Status: DC
Start: 2021-09-01 — End: 2021-09-03
  Administered 2021-09-01 – 2021-09-02 (×2): 1 via ORAL
  Filled 2021-09-01 (×2): qty 1

## 2021-09-01 NOTE — BH Assessment (Signed)
Clinician messaged Aryana G. Janee Morn, RN: "Hey. It's Trey with TTS. Is the pt able to engage in the assessment, if so the pt will need to be placed in a private room. Also is the pt under IVC? Is the pt medically cleared?    Clinician awaiting response.     Redmond Pulling, MS, [facility], [facility] Triage Specialist [phone]

## 2021-09-01 NOTE — ED Triage Notes (Signed)
Pt arrived POV from home c/o SI without a plan. PT states he tried to cut his wrist a month ago.

## 2021-09-01 NOTE — ED Notes (Signed)
Pt belongings in visitor locker 2

## 2021-09-01 NOTE — ED Notes (Signed)
Sister Aurea Graff [phone], father Brian Zeitlin [phone] would like an update asap

## 2021-09-01 NOTE — ED Notes (Signed)
Pt calm and cooperative, ambulated to bathroom with steady gait. Respirations even and unlabored

## 2021-09-01 NOTE — ED Notes (Signed)
TTS in process 

## 2021-09-01 NOTE — ED Provider Notes (Signed)
[facility]   CSN: [id number] Arrival date & time: 09/01/21  1229     History  Chief Complaint  Patient presents with   Suicidal    Zachary [name] is a 61 y.o. male.  Patient with history of COPD, hypertension, alcohol abuse presenting with suicidal ideation and "not wanting to live anymore". States he has not felt right since he was discharged from behavioral health hospital 1 month ago after a suicide attempt with wrist laceration. States he was prescribed clonazepam but he "ate through them too quickly" and is now out.  Has not had any clonazepam for the past 1 week.  Has been binging on alcohol but denies any withdrawal symptoms.  No other drug use.  Has diffuse abdominal pain which she states is a chronic problem for him.  No vomiting, fever, chest pain, shortness of breath. Denies any homicidal thoughts.  Denies any hallucinations or hearing voices.  The history is provided by the patient. The history is limited by the condition of the patient.       Home Medications Prior to Admission medications   Medication Sig Start Date End Date Taking? Authorizing Provider  albuterol (VENTOLIN HFA) 108 (90 Base) MCG/ACT inhaler Inhale 1-2 puffs into the lungs every 6 (six) hours as needed for wheezing or shortness of breath. 07/10/18   Malvin JohnsFarah, Brian, MD  cholecalciferol (VITAMIN D3) 25 MCG (1000 UT) tablet Take 1,000 Units by mouth daily. Patient not taking: Reported on 07/20/2021    [provider]  clonazePAM (KLONOPIN) 1 MG tablet Take 1 tablet (1 mg total) by mouth 3 (three) times daily as needed for anxiety. 08/14/21   Raspet, Noberto RetortErin K, PA-C  gabapentin (NEURONTIN) 300 MG capsule Take 1 capsule (300 mg total) by mouth 3 (three) times daily. 07/24/21 08/23/21  Park PopeJi, Andrew, MD  lisinopril-hydrochlorothiazide (ZESTORETIC) 10-12.5 MG tablet Take 1 tablet by mouth daily. Patient not taking: Reported on 07/20/2021 05/09/18   [provider]  Multiple Vitamin (MULTIVITAMIN WITH MINERALS) TABS tablet Take 1 tablet by mouth daily. 07/25/21   Park PopeJi, Andrew, MD  thiamine 100 MG tablet Take 1 tablet (100 mg total) by mouth daily. 07/25/21   Park PopeJi, Andrew, MD      Allergies    Penicillins and Statins    Review of Systems   Review of Systems  Constitutional:  Negative for activity change, appetite change and fever.  Respiratory:  Negative for chest tightness and shortness of breath.   Cardiovascular:  Negative for chest pain.  Gastrointestinal:  Negative for abdominal pain, nausea and vomiting.  Genitourinary:  Negative for dysuria and hematuria.  Musculoskeletal:  Negative for arthralgias and myalgias.  Psychiatric/Behavioral:  Positive for decreased concentration, dysphoric mood, sleep disturbance and suicidal ideas. The patient is nervous/anxious and is hyperactive.    all other systems are negative except as noted in the HPI and PMH.    Physical Exam Updated Vital Signs BP 123/80   Pulse 60   Temp 97.7 F (36.5 C) (Oral)   Resp (!) 23   Ht 5\' 10"  (1.778 m)   Wt 63.5 kg   SpO2 96%   BMI 20.09 kg/m  Physical Exam Vitals and nursing note reviewed.  Constitutional:      General: He is not in acute distress.    Appearance: He is well-developed.     Comments: disheveled  HENT:     Head: Normocephalic and atraumatic.     Mouth/Throat:  Pharynx: No oropharyngeal exudate.  Eyes:     Conjunctiva/sclera: Conjunctivae normal.     Pupils: Pupils are equal, round, and reactive to light.  Neck:     Comments: No meningismus. Cardiovascular:     Rate and Rhythm: Normal rate and regular rhythm.     Heart sounds: Normal heart sounds. No murmur heard. Pulmonary:     Effort: Pulmonary effort is normal. No respiratory distress.     Breath sounds: Normal breath sounds.  Chest:     Chest wall: No tenderness.  Abdominal:     Palpations: Abdomen is soft.     Tenderness: There is abdominal tenderness. There is no  guarding or rebound.     Comments: Mild diffuse tenderness  Musculoskeletal:        General: No tenderness. Normal range of motion.     Cervical back: Normal range of motion and neck supple.  Skin:    General: Skin is warm.  Neurological:     Mental Status: He is alert and oriented to person, place, and time.     Cranial Nerves: No cranial nerve deficit.     Motor: No abnormal muscle tone.     Coordination: Coordination normal.     Comments:  5/5 strength throughout. CN 2-12 intact.Equal grip strength.   Psychiatric:        Behavior: Behavior normal.     ED Results / Procedures / Treatments   Labs (all labs ordered are listed, but only abnormal results are displayed) Labs Reviewed  COMPREHENSIVE METABOLIC PANEL - Abnormal; Notable for the following components:      Result Value   CO2 21 (*)    BUN 5 (*)    All other components within normal limits  ETHANOL - Abnormal; Notable for the following components:   Alcohol, Ethyl (B) 154 (*)    All other components within normal limits  RAPID URINE DRUG SCREEN, HOSP PERFORMED - Abnormal; Notable for the following components:   Benzodiazepines POSITIVE (*)    Tetrahydrocannabinol POSITIVE (*)    All other components within normal limits  CBC WITH DIFFERENTIAL/PLATELET - Abnormal; Notable for the following components:   Abs Immature Granulocytes 0.08 (*)    All other components within normal limits  SALICYLATE LEVEL - Abnormal; Notable for the following components:   Salicylate Lvl <7.0 (*)    All other components within normal limits  ACETAMINOPHEN LEVEL - Abnormal; Notable for the following components:   Acetaminophen (Tylenol), Serum <10 (*)    All other components within normal limits  RESP PANEL BY RT-PCR (FLU A&B, COVID) ARPGX2    EKG None  Radiology No results found.  Procedures Procedures    Medications Ordered in ED Medications  LORazepam (ATIVAN) tablet 1-4 mg (has no administration in time range)    Or   LORazepam (ATIVAN) injection 1-4 mg (has no administration in time range)  thiamine tablet 100 mg (has no administration in time range)    Or  thiamine (B-1) injection 100 mg (has no administration in time range)  folic acid (FOLVITE) tablet 1 mg (has no administration in time range)  multivitamin with minerals tablet 1 tablet (has no administration in time range)  LORazepam (ATIVAN) tablet 0-4 mg (has no administration in time range)    Followed by  LORazepam (ATIVAN) tablet 0-4 mg (has no administration in time range)    ED Course/ Medical Decision Making/ A&P  Medical Decision Making Amount and/or Complexity of Data Reviewed Labs: ordered. Decision-making details documented in ED Course. Radiology: ordered and independent interpretation performed. Decision-making details documented in ED Course. ECG/medicine tests: ordered and independent interpretation performed. Decision-making details documented in ED Course.  Risk OTC drugs. Prescription drug management.   Suicidal ideation without a plan.  Did attempt to end his life by cutting his wrist a month ago.  He is calm and cooperative.  He has been binging on alcohol.  Voluntary at this time.  Screening labs will be obtained and TTS to be consulted  Screening labs significant for alcohol intoxication.  Drug screen positive for THC and benzodiazepines  Patient is medically clear for psychiatric evaluation.  Holding orders are placed.  CIWA protocol was placed.        Final Clinical Impression(s) / ED Diagnoses Final diagnoses:  None    Rx / DC Orders ED Discharge Orders     None         Ammiel Guiney, Jeannett Senior, MD 09/01/21 1708
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
## 2021-09-02 DIAGNOSIS — F1094 Alcohol use, unspecified with alcohol-induced mood disorder: Secondary | ICD-10-CM

## 2021-09-02 MED ORDER — ONDANSETRON 4 MG PO TBDP
4.0000 mg | ORAL_TABLET | Freq: Three times a day (TID) | ORAL | Status: DC | PRN
  Administered 2021-09-02: 4 mg via ORAL
  Filled 2021-09-02: qty 1

## 2021-09-02 NOTE — Consult Note (Cosign Needed)
Telepsych Consultation   Reason for Consult:  Psychiatric Reassessment Referring Physician:  Dr. Manus Gunning Location of Patient:   [facility] Location of Provider: Other: virtual home office  Patient Identification: Zachary [name] MRN:  [id number] Principal Diagnosis: Alcohol-induced mood disorder with depressive symptoms (HCC) Diagnosis:  Principal Problem:   Alcohol-induced mood disorder with depressive symptoms (HCC) Active Problems:   Alcohol use disorder   MDD (major depressive disorder), recurrent severe, without psychosis (HCC)   Total Time spent with patient: 30 minutes  Subjective:   Zachary [name] is a 61 y.o. male patient admitted with suicidal ideations; BAL was 154. UDS was positive for benzodiazepines and THC. Pt is prescribed clonazepam outpatient.   HPI:  Patient seen via telepsych by this provider; chart reviewed and consulted with Dr. Lucianne Muss on 09/02/21.  On evaluation Zachary [name] reports he no longer has thoughts of self harm and would like to go home.  Reports he was triggered by an argument with his girlfriend but he spoke with her today and they were able to resolve things.  Patient's admission BAL was 154, so appears alcohol played in role in his presentation. Patient states he feels"much better", contracts for safety and is future oriented.    Per EDP Admission Assessment 09/01/2021: Chief Complaint  Patient presents with   Suicidal      Zachary [name] is a 61 y.o. male.   Patient with history of COPD, hypertension, alcohol abuse presenting with suicidal ideation and "not wanting to live anymore". States he has not felt right since he was discharged from behavioral health hospital 1 month ago after a suicide attempt with wrist laceration. States he was prescribed clonazepam but he "ate through them too quickly" and is now out.  Has not had any clonazepam for the past 1 week.  Has been binging on alcohol but denies any withdrawal symptoms.  No other  drug use.  Has diffuse abdominal pain which she states is a chronic problem for him.  No vomiting, fever, chest pain, shortness of breath. Denies any homicidal thoughts.  Denies any hallucinations or hearing voices.   The history is provided by the patient. The history is limited by the condition of the patient.  Past Psychiatric History: depression, anxiety  Risk to Self:  no Risk to Others:  no Prior Inpatient Therapy:  no Prior Outpatient Therapy:  yes  Past Medical History:  Past Medical History:  Diagnosis Date   Alcohol abuse    COPD (chronic obstructive pulmonary disease) (HCC)    Depression    Hypertension     Past Surgical History:  Procedure Laterality Date   COLONOSCOPY     MOUTH SURGERY     NO PAST SURGERIES     Family History:  Family History  Problem Relation Age of Onset   Heart attack Paternal Grandfather    Stroke Mother    Heart failure Mother    Heart failure Father    Family Psychiatric  History: unknown Social History:  Social History   Substance and Sexual Activity  Alcohol Use Yes   Comment: BAC was .23 on admission     Social History   Substance and Sexual Activity  Drug Use Yes   Types: Marijuana   Comment: UDS was negative    Social History   Socioeconomic History   Marital status: Divorced    Spouse name: Not on file   Number of children: 2   Years of education: Not on file  Highest education level: Not on file  Occupational History   Occupation: Product/process development scientist  Tobacco Use   Smoking status: Some Days    Packs/day: 2.00    Types: Cigarettes   Smokeless tobacco: Never  Vaping Use   Vaping Use: Not on file  Substance and Sexual Activity   Alcohol use: Yes    Comment: BAC was .23 on admission   Drug use: Yes    Types: Marijuana    Comment: UDS was negative   Sexual activity: Not Currently  Other Topics Concern   Not on file  Social History Narrative   Pt stated that he lives alone; works as a Product/process development scientist    Social Determinants of Corporate investment banker Strain: Not on file  Food Insecurity: Not on file  Transportation Needs: Not on file  Physical Activity: Not on file  Stress: Not on file  Social Connections: Not on file   Additional Social History:    Allergies:   Allergies  Allergen Reactions   Penicillins Other (See Comments)    Childhood allergy Reaction:  Unknown  Has patient had a PCN reaction causing immediate rash, facial/tongue/throat swelling, SOB or lightheadedness with hypotension: Unknown Has patient had a PCN reaction causing severe rash involving mucus membranes or skin necrosis: Unknown Has patient had a PCN reaction that required hospitalization Unknown Has patient had a PCN reaction occurring within the last 10 years: No If all of the above answers are "NO", then may proceed with Cephalosporin use.   Statins Palpitations    Labs:  Results for orders placed or performed during the hospital encounter of 09/01/21 (from the past 48 hour(s))  Resp Panel by RT-PCR (Flu A&B, Covid) Anterior Nasal Swab     Status: None   Collection Time: 09/01/21 12:49 PM   Specimen: Anterior Nasal Swab  Result Value Ref Range   SARS Coronavirus 2 by RT PCR NEGATIVE NEGATIVE    Comment: (NOTE) SARS-CoV-2 target nucleic acids are NOT DETECTED.  The SARS-CoV-2 RNA is generally detectable in upper respiratory specimens during the acute phase of infection. The lowest concentration of SARS-CoV-2 viral copies this assay can detect is 138 copies/mL. A negative result does not preclude SARS-Cov-2 infection and should not be used as the sole basis for treatment or other patient management decisions. A negative result may occur with  improper specimen collection/handling, submission of specimen other than nasopharyngeal swab, presence of viral mutation(s) within the areas targeted by this assay, and inadequate number of viral copies(<138 copies/mL). A negative result must be combined  with clinical observations, patient history, and epidemiological information. The expected result is Negative.  Fact Sheet for Patients:  BloggerCourse.com  Fact Sheet for Healthcare Providers:  SeriousBroker.it  This test is no t yet approved or cleared by the [place] FDA and  has been authorized for detection and/or diagnosis of SARS-CoV-2 by FDA under an Emergency Use Authorization (EUA). This EUA will remain  in effect (meaning this test can be used) for the duration of the COVID-19 declaration under Section 564(b)(1) of the Act, 21 U.S.C.section 360bbb-3(b)(1), unless the authorization is terminated  or revoked sooner.       Influenza A by PCR NEGATIVE NEGATIVE   Influenza B by PCR NEGATIVE NEGATIVE    Comment: (NOTE) The Xpert Xpress SARS-CoV-2/FLU/RSV plus assay is intended as an aid in the diagnosis of influenza from Nasopharyngeal swab specimens and should not be used as a sole basis for treatment. Nasal washings and aspirates  are unacceptable for Xpert Xpress SARS-CoV-2/FLU/RSV testing.  Fact Sheet for Patients: BloggerCourse.com  Fact Sheet for Healthcare Providers: SeriousBroker.it  This test is not yet approved or cleared by the [place] FDA and has been authorized for detection and/or diagnosis of SARS-CoV-2 by FDA under an Emergency Use Authorization (EUA). This EUA will remain in effect (meaning this test can be used) for the duration of the COVID-19 declaration under Section 564(b)(1) of the Act, 21 U.S.C. section 360bbb-3(b)(1), unless the authorization is terminated or revoked.  Performed at [facility], [address] [place], [place] [address]   Urine rapid drug screen (hosp performed)     Status: Abnormal   Collection Time: 09/01/21 12:49 PM  Result Value Ref Range   Opiates NONE DETECTED NONE DETECTED   Cocaine NONE DETECTED NONE  DETECTED   Benzodiazepines POSITIVE (A) NONE DETECTED   Amphetamines NONE DETECTED NONE DETECTED   Tetrahydrocannabinol POSITIVE (A) NONE DETECTED   Barbiturates NONE DETECTED NONE DETECTED    Comment: (NOTE) DRUG SCREEN FOR MEDICAL PURPOSES ONLY.  IF CONFIRMATION IS NEEDED FOR ANY PURPOSE, NOTIFY LAB WITHIN 5 DAYS.  LOWEST DETECTABLE LIMITS FOR URINE DRUG SCREEN Drug Class                     Cutoff (ng/mL) Amphetamine and metabolites    1000 Barbiturate and metabolites    200 Benzodiazepine                 200 Tricyclics and metabolites     300 Opiates and metabolites        300 Cocaine and metabolites        300 THC                            50 Performed at [facility], [address] [place], [place] [address]   Comprehensive metabolic panel     Status: Abnormal   Collection Time: 09/01/21 12:57 PM  Result Value Ref Range   Sodium 139 135 - 145 mmol/L   Potassium 4.0 3.5 - 5.1 mmol/L   Chloride 107 98 - 111 mmol/L   CO2 21 (L) 22 - 32 mmol/L   Glucose, Bld 80 70 - 99 mg/dL    Comment: Glucose reference range applies only to samples taken after fasting for at least 8 hours.   BUN 5 (L) 8 - 23 mg/dL   Creatinine, Ser 3.15 0.61 - 1.24 mg/dL   Calcium 9.4 8.9 - 17.6 mg/dL   Total Protein 7.1 6.5 - 8.1 g/dL   Albumin 4.3 3.5 - 5.0 g/dL   AST 17 15 - 41 U/L   ALT 17 0 - 44 U/L   Alkaline Phosphatase 86 38 - 126 U/L   Total Bilirubin 0.5 0.3 - 1.2 mg/dL   GFR, Estimated >16 >07 mL/min    Comment: (NOTE) Calculated using the CKD-EPI Creatinine Equation (2021)    Anion gap 11 5 - 15    Comment: Performed at [facility], [address] [place], [place] [address]  Ethanol     Status: Abnormal   Collection Time: 09/01/21 12:57 PM  Result Value Ref Range   Alcohol, Ethyl (B) 154 (H) <10 mg/dL    Comment: (NOTE) Lowest detectable limit for serum alcohol is 10 mg/dL.  For medical purposes only. Performed at [facility], [address]  [place], [place] [address]  CBC with Diff     Status: Abnormal   Collection Time: 09/01/21 12:57 PM  Result Value Ref Range   WBC 7.5 4.0 - 10.5 K/uL   RBC 5.08 4.22 - 5.81 MIL/uL   Hemoglobin 16.7 13.0 - 17.0 g/dL   HCT 16.1 09.6 - 04.5 %   MCV 91.7 80.0 - 100.0 fL   MCH 32.9 26.0 - 34.0 pg   MCHC 35.8 30.0 - 36.0 g/dL   RDW 40.9 81.1 - 91.4 %   Platelets 184 150 - 400 K/uL   nRBC 0.0 0.0 - 0.2 %   Neutrophils Relative % 53 %   Neutro Abs 4.1 1.7 - 7.7 K/uL   Lymphocytes Relative 33 %   Lymphs Abs 2.4 0.7 - 4.0 K/uL   Monocytes Relative 9 %   Monocytes Absolute 0.6 0.1 - 1.0 K/uL   Eosinophils Relative 3 %   Eosinophils Absolute 0.2 0.0 - 0.5 K/uL   Basophils Relative 1 %   Basophils Absolute 0.1 0.0 - 0.1 K/uL   Immature Granulocytes 1 %   Abs Immature Granulocytes 0.08 (H) 0.00 - 0.07 K/uL    Comment: Performed at [facility], [address] [place], [place] [address]  Salicylate level     Status: Abnormal   Collection Time: 09/01/21 12:57 PM  Result Value Ref Range   Salicylate Lvl <7.0 (L) 7.0 - 30.0 mg/dL    Comment: Performed at [facility] Lab, [address] [place], [place] [address]  Acetaminophen level     Status: Abnormal   Collection Time: 09/01/21 12:57 PM  Result Value Ref Range   Acetaminophen (Tylenol), Serum <10 (L) 10 - 30 ug/mL    Comment: (NOTE) Therapeutic concentrations vary significantly. A range of 10-30 ug/mL  may be an effective concentration for many patients. However, some  are best treated at concentrations outside of this range. Acetaminophen concentrations >150 ug/mL at 4 hours after ingestion  and >50 ug/mL at 12 hours after ingestion are often associated with  toxic reactions.  Performed at [facility], [address], [place], [place] [address]     Medications:  Current Facility-Administered Medications  Medication Dose Route Frequency Provider Last Rate Last Admin   DULoxetine (CYMBALTA) DR capsule 30 mg  30 mg  Oral Daily Rancour, Stephen, MD   30 mg at 09/02/21 1047   folic acid (FOLVITE) tablet 1 mg  1 mg Oral Daily Rancour, Stephen, MD   1 mg at 09/02/21 1047   gabapentin (NEURONTIN) capsule 300 mg  300 mg Oral TID Rancour, Jeannett Senior, MD   300 mg at 09/02/21 1701   hydrochlorothiazide (HYDRODIURIL) tablet 12.5 mg  12.5 mg Oral Daily Paytes, Austin A, RPH   12.5 mg at 09/02/21 1048   lisinopril (ZESTRIL) tablet 10 mg  10 mg Oral Daily Paytes, Austin A, RPH   10 mg at 09/02/21 1047   LORazepam (ATIVAN) tablet 1-4 mg  1-4 mg Oral Q1H PRN Rancour, Stephen, MD       Or   LORazepam (ATIVAN) injection 1-4 mg  1-4 mg Intravenous Q1H PRN Rancour, Stephen, MD       LORazepam (ATIVAN) tablet 0-4 mg  0-4 mg Oral Q6H Rancour, Stephen, MD   1 mg at 09/02/21 1701   Followed by   Melene Muller ON 09/03/2021] LORazepam (ATIVAN) tablet 0-4 mg  0-4 mg Oral Q12H Rancour, Stephen, MD       multivitamin with minerals tablet 1 tablet  1  tablet Oral Daily Rancour, Stephen, MD   1 tablet at 09/02/21 1047   naltrexone (DEPADE) tablet 50 mg  50 mg Oral Daily Rancour, Stephen, MD   50 mg at 09/02/21 1048   ondansetron (ZOFRAN-ODT) disintegrating tablet 4 mg  4 mg Oral Q8H PRN Gerhard Munch, MD   4 mg at 09/02/21 1046   pantoprazole (PROTONIX) EC tablet 40 mg  40 mg Oral Daily Rancour, Stephen, MD   40 mg at 09/02/21 1047   thiamine tablet 100 mg  100 mg Oral Daily Rancour, Stephen, MD   100 mg at 09/02/21 1048   Or   thiamine (B-1) injection 100 mg  100 mg Intravenous Daily Rancour, Stephen, MD       Current Outpatient Medications  Medication Sig Dispense Refill   albuterol (VENTOLIN HFA) 108 (90 Base) MCG/ACT inhaler Inhale 1-2 puffs into the lungs every 6 (six) hours as needed for wheezing or shortness of breath. 1 Inhaler 2   DULoxetine (CYMBALTA) 30 MG capsule Take 30 mg by mouth in the morning and at bedtime.     naltrexone (DEPADE) 50 MG tablet Take 50 mg by mouth daily.     pantoprazole (PROTONIX) 40 MG tablet Take 40 mg by  mouth daily.     clonazePAM (KLONOPIN) 1 MG tablet Take 1 tablet (1 mg total) by mouth 3 (three) times daily as needed for anxiety. (Patient not taking: Reported on 09/01/2021) 15 tablet 0   gabapentin (NEURONTIN) 300 MG capsule Take 1 capsule (300 mg total) by mouth 3 (three) times daily. (Patient not taking: Reported on 09/01/2021) 90 capsule 0    Musculoskeletal: pt moves all extremities and ambulates independently Strength & Muscle Tone: within normal limits Gait & Station: normal Patient leans: N/A  Psychiatric Specialty Exam:  Presentation  General Appearance: Appropriate for Environment; Casual  Eye Contact:Good  Speech:Clear and Coherent; Normal Rate  Speech Volume:Normal  Handedness:Right   Mood and Affect  Mood:Euthymic  Affect:Appropriate; Congruent   Thought Process  Thought Processes:Coherent; Goal Directed  Descriptions of Associations:Intact  Orientation:Full (Time, Place and Person)  Thought Content:Logical (has improved since admission)  History of Schizophrenia/Schizoaffective disorder:No  Duration of Psychotic Symptoms:No data recorded Hallucinations:Hallucinations: None  Ideas of Reference:None  Suicidal Thoughts:Suicidal Thoughts: No (has improved since admission)  Homicidal Thoughts:Homicidal Thoughts: No   Sensorium  Memory:Immediate Good; Recent Good; Remote Good  Judgment:Fair (fair at baseline d/t alcohol abuse; but there are no acute safety concerns)  Insight:Good   Executive Functions  Concentration:Good  Attention Span:Good  Recall:Good  Fund of Knowledge:Good  Language:Good   Psychomotor Activity  Psychomotor Activity:Psychomotor Activity: Normal   Assets  Assets:Communication Skills; Housing; Desire for Improvement; Social Support; Financial Resources/Insurance   Sleep  Sleep:Sleep: Good Number of Hours of Sleep: 7    Physical Exam: Physical Exam Constitutional:      Appearance: Normal appearance.   Cardiovascular:     Rate and Rhythm: Normal rate.     Pulses: Normal pulses.  Pulmonary:     Effort: Pulmonary effort is normal.  Musculoskeletal:     Cervical back: Normal range of motion.  Neurological:     General: No focal deficit present.     Mental Status: He is alert and oriented to person, place, and time.  Psychiatric:        Attention and Perception: Attention and perception normal.        Mood and Affect: Mood and affect normal.        Speech: Speech  normal.        Behavior: Behavior normal. Behavior is cooperative.        Thought Content: Thought content is not paranoid or delusional. Thought content does not include homicidal or suicidal ideation. Thought content does not include homicidal or suicidal plan.        Cognition and Memory: Cognition and memory normal.        Judgment: Judgment normal.    Review of Systems  Constitutional: Negative.   HENT: Negative.    Eyes: Negative.   Respiratory: Negative.    Cardiovascular: Negative.   Gastrointestinal: Negative.   Genitourinary: Negative.   Skin: Negative.   Neurological: Negative.   Endo/Heme/Allergies: Negative.   Psychiatric/Behavioral:  Positive for depression and substance abuse. Negative for hallucinations and suicidal ideas. The patient does not have insomnia.    Blood pressure (!) 101/47, pulse (!) 59, temperature 98 F (36.7 C), temperature source Oral, resp. rate 18, height 5\' 10"  (1.778 m), weight 63.5 kg, SpO2 95 %. Body mass index is 20.09 kg/m.  Treatment Plan Summary: Patient no longer endorses suicidal ideations and contracts for safety.   Plan- As per above assessment, there are no current grounds for involuntary commitment at this time.  Patient is not currently interested in inpatient services but expresses agreement to continue outpatient treatment., we have reviewed importance of substance abuse abstinence, potential negative impact substance abuse can have on his relationships and level  of functioning, and importance of medication compliance. Patient states he has new psychiatric appointment pending with Dr. Lyla SonKrista Anderson [facility].  SW will include resources for SUD.  Pt should continue his home medications.   Disposition: No evidence of imminent risk to self or others at present.   Patient does not meet criteria for psychiatric inpatient admission. Supportive therapy provided about ongoing stressors. Discussed crisis plan, support from social network, calling 911, coming to the Emergency Department, and calling Suicide Hotline.  This service was provided via telemedicine using a 2-way, interactive audio and video technology.  Names of all persons participating in this telemedicine service and their role in this encounter. Name: Zachary [name] Role: Patient  Name: Ophelia ShoulderShnese Tericka Devincenzi Role: PMHNP    Chales AbrahamsShnese E Teka Chanda, NP 09/02/2021 10:41 PM

## 2021-09-02 NOTE — Consult Note (Cosign Needed)
Reached out to nurse Celine Ahr, requesting to see patient for psychiatric reassessment tvia TTS. Per nurse she needed to locate the cart.

## 2021-09-02 NOTE — Consult Note (Cosign Needed)
Spoke with nursing staff, 3rd request to see patient for psychiatric reassessment via TTS machine.  Per nursing, she will locate the call and let me know.

## 2021-09-02 NOTE — ED Provider Notes (Signed)
Patient psychiatrically cleared.  Provided outpatient resources and support.  Appears stable at time of recommended discharge.   Rozelle Logan, DO 09/02/21 [id number]

## 2021-09-02 NOTE — ED Provider Notes (Signed)
Emergency Medicine Observation Re-evaluation Note  Zachary [name] is a 61 y.o. male, seen on rounds today.  Pt initially presented to the ED for complaints of Suicidal Currently, the patient is resting.  Physical Exam  BP 122/60   Pulse 69   Temp 98.1 F (36.7 C) (Oral)   Resp 18   Ht 5\' 10"  (1.778 m)   Wt 63.5 kg   SpO2 92%   BMI 20.09 kg/m  Physical Exam General: No distress Cardiac: Regular rate and rhythm Lungs: No increased work of breathing Psych: Calm  ED Course / MDM  EKG:EKG Interpretation  Date/Time:  Wednesday September 01 2021 13:25:35 EDT Ventricular Rate:  49 PR Interval:  148 QRS Duration: 110 QT Interval:  446 QTC Calculation: 402 R Axis:   62 Text Interpretation: Sinus bradycardia Incomplete right bundle branch block Borderline ECG When compared with ECG of 19-Jul-2021 19:21, PREVIOUS ECG IS PRESENT No significant change was found Confirmed by 21-Jul-2021 [phone]) on 09/01/2021 4:50:13 PM  I have reviewed the labs performed to date as well as medications administered while in observation.  Recent changes in the last 24 hours include none.  Plan  Current plan is for placement.  Zachary [name] is not under involuntary commitment.     Georgina Peer, MD 09/02/21 [phone]

## 2021-09-02 NOTE — Progress Notes (Addendum)
CSW added community resources for outpatient mental health therapy and substance abuse treatment to pt's AVS. Pt is psych cleared per Hyacinth Meeker, NP. This CSW will now remove pt from the [facility] shift report and TOC will assist with any discharge needs.    Maryjean Ka, MSW, [facility] 09/02/2021 10:28 PM

## 2021-09-02 NOTE — ED Notes (Signed)
TTS in process 

## 2021-09-02 NOTE — Consult Note (Cosign Needed)
2nd attempt to see patient for tts reassessment.  Sent secure message to Jackelyn Knife, RN and awaiting response.  Called tts cart but no response.

## 2021-09-04 ENCOUNTER — Emergency Department (HOSPITAL_COMMUNITY): Payer: Medicaid Other

## 2021-09-04 ENCOUNTER — Emergency Department (HOSPITAL_COMMUNITY)
Admission: EM | Admit: 2021-09-04 | Discharge: 2021-09-06 | Disposition: A | Discharge status: Home / Self Care | Payer: Medicaid Other | Attending: Emergency Medicine | Admitting: Emergency Medicine

## 2021-09-04 ENCOUNTER — Other Ambulatory Visit: Payer: Self-pay

## 2021-09-04 ENCOUNTER — Encounter (HOSPITAL_COMMUNITY): Payer: Self-pay

## 2021-09-04 DIAGNOSIS — Z20822 Contact with and (suspected) exposure to covid-19: Secondary | ICD-10-CM | POA: Diagnosis not present

## 2021-09-04 DIAGNOSIS — F332 Major depressive disorder, recurrent severe without psychotic features: Secondary | ICD-10-CM | POA: Diagnosis not present

## 2021-09-04 DIAGNOSIS — R45851 Suicidal ideations: Secondary | ICD-10-CM | POA: Insufficient documentation

## 2021-09-04 DIAGNOSIS — R0781 Pleurodynia: Secondary | ICD-10-CM | POA: Diagnosis not present

## 2021-09-04 DIAGNOSIS — F331 Major depressive disorder, recurrent, moderate: Secondary | ICD-10-CM | POA: Diagnosis present

## 2021-09-04 DIAGNOSIS — F1020 Alcohol dependence, uncomplicated: Secondary | ICD-10-CM | POA: Insufficient documentation

## 2021-09-04 DIAGNOSIS — W19XXXA Unspecified fall, initial encounter: Secondary | ICD-10-CM

## 2021-09-04 DIAGNOSIS — Z046 Encounter for general psychiatric examination, requested by authority: Secondary | ICD-10-CM | POA: Diagnosis present

## 2021-09-04 LAB — COMPREHENSIVE METABOLIC PANEL
ALT: 19 U/L (ref 0–44)
AST: 19 U/L (ref 15–41)
Albumin: 4.5 g/dL (ref 3.5–5.0)
Alkaline Phosphatase: 90 U/L (ref 38–126)
Anion gap: 11 (ref 5–15)
BUN: 9 mg/dL (ref 8–23)
CO2: 24 mmol/L (ref 22–32)
Calcium: 9.3 mg/dL (ref 8.9–10.3)
Chloride: 104 mmol/L (ref 98–111)
Creatinine, Ser: 0.73 mg/dL (ref 0.61–1.24)
GFR, Estimated: >60 mL/min (ref >60)
Glucose, Bld: 86 mg/dL (ref 70–99)
Potassium: 3.9 mmol/L (ref 3.5–5.1)
Sodium: 139 mmol/L (ref 135–145)
Total Bilirubin: 0.7 mg/dL (ref 0.3–1.2)
Total Protein: 7.2 g/dL (ref 6.5–8.1)

## 2021-09-04 LAB — RAPID URINE DRUG SCREEN, HOSP PERFORMED
Amphetamines: NOT DETECTED
Barbiturates: NOT DETECTED
Benzodiazepines: NOT DETECTED
Cocaine: NOT DETECTED
Opiates: NOT DETECTED
Tetrahydrocannabinol: POSITIVE — AB

## 2021-09-04 LAB — CBC WITH DIFFERENTIAL/PLATELET
Abs Immature Granulocytes: 0.15 10*3/uL — ABNORMAL HIGH (ref 0.00–0.07)
Basophils Absolute: 0.1 10*3/uL (ref 0.0–0.1)
Basophils Relative: 1 %
Eosinophils Absolute: 0.2 10*3/uL (ref 0.0–0.5)
Eosinophils Relative: 2 %
HCT: 46.8 % (ref 39.0–52.0)
Hemoglobin: 17 g/dL (ref 13.0–17.0)
Immature Granulocytes: 2 %
Lymphocytes Relative: 23 %
Lymphs Abs: 2.2 10*3/uL (ref 0.7–4.0)
MCH: 33.2 pg (ref 26.0–34.0)
MCHC: 36.3 g/dL — ABNORMAL HIGH (ref 30.0–36.0)
MCV: 91.4 fL (ref 80.0–100.0)
Monocytes Absolute: 0.9 10*3/uL (ref 0.1–1.0)
Monocytes Relative: 9 %
Neutro Abs: 6.1 10*3/uL (ref 1.7–7.7)
Neutrophils Relative %: 63 %
Platelets: 171 10*3/uL (ref 150–400)
RBC: 5.12 MIL/uL (ref 4.22–5.81)
RDW: 12.4 % (ref 11.5–15.5)
WBC: 9.6 10*3/uL (ref 4.0–10.5)
nRBC: 0 % (ref 0.0–0.2)

## 2021-09-04 LAB — ACETAMINOPHEN LEVEL: Acetaminophen (Tylenol), Serum: <10 ug/mL — ABNORMAL LOW (ref 10–30)

## 2021-09-04 LAB — RESP PANEL BY RT-PCR (FLU A&B, COVID) ARPGX2
Influenza A by PCR: NEGATIVE
Influenza B by PCR: NEGATIVE
SARS Coronavirus 2 by RT PCR: NEGATIVE

## 2021-09-04 LAB — SALICYLATE LEVEL: Salicylate Lvl: <7 mg/dL — ABNORMAL LOW (ref 7.0–30.0)

## 2021-09-04 LAB — ETHANOL: Alcohol, Ethyl (B): 127 mg/dL — ABNORMAL HIGH (ref <10)

## 2021-09-04 LAB — CBG MONITORING, ED: Glucose-Capillary: 99 mg/dL (ref 70–99)

## 2021-09-04 MED ORDER — THIAMINE HCL 100 MG PO TABS
100.0000 mg | ORAL_TABLET | Freq: Every day | ORAL | Status: DC
  Administered 2021-09-04 – 2021-09-06 (×3): 100 mg via ORAL
  Filled 2021-09-04 (×3): qty 1

## 2021-09-04 MED ORDER — THIAMINE HCL 100 MG/ML IJ SOLN: 100.0000 mg | Freq: Every day | INTRAMUSCULAR | Status: DC

## 2021-09-04 MED ORDER — OXYCODONE-ACETAMINOPHEN 5-325 MG PO TABS
1.0000 | ORAL_TABLET | Freq: Once | ORAL | Status: AC
  Administered 2021-09-04: 1 via ORAL
  Filled 2021-09-04: qty 1

## 2021-09-04 MED ORDER — LORAZEPAM 2 MG/ML IJ SOLN: 0.0000 mg | Freq: Two times a day (BID) | INTRAMUSCULAR | Status: DC

## 2021-09-04 MED ORDER — LORAZEPAM 1 MG PO TABS
0.0000 mg | ORAL_TABLET | Freq: Four times a day (QID) | ORAL | Status: AC
  Administered 2021-09-04 – 2021-09-05 (×7): 1 mg via ORAL
  Filled 2021-09-04 (×7): qty 1

## 2021-09-04 MED ORDER — LORAZEPAM 2 MG/ML IJ SOLN
0.0000 mg | Freq: Four times a day (QID) | INTRAMUSCULAR | Status: AC
  Administered 2021-09-04: 2 mg via INTRAVENOUS
  Filled 2021-09-04: qty 1

## 2021-09-04 MED ORDER — LORAZEPAM 1 MG PO TABS
0.0000 mg | ORAL_TABLET | Freq: Two times a day (BID) | ORAL | Status: DC
  Administered 2021-09-06: 2 mg via ORAL
  Filled 2021-09-04: qty 2

## 2021-09-04 NOTE — ED Notes (Signed)
Patient in bed resting at this time.

## 2021-09-04 NOTE — ED Notes (Signed)
Patient wanded by security. 

## 2021-09-04 NOTE — Consult Note (Signed)
[facility] ED ASSESSMENT   Reason for Consult:  Psychiatry evaluation Referring Physician:  ER Physician Patient Identification: Zachary [name] MRN:  [id number] ED Chief Complaint: MDD (major depressive disorder), recurrent severe, without psychosis (HCC)  Diagnosis:  Principal Problem:   MDD (major depressive disorder), recurrent severe, without psychosis (HCC) Active Problems:   Alcohol use disorder, severe, dependence (HCC)   ED Assessment Time Calculation: Start Time: 1313 Stop Time: 1340 Total Time in Minutes (Assessment Completion): 27   Subjective:   Zachary [name] is a 61 y.o. male patient admitted with long hx of Depression, anxiety and Alcohol dependence was brought in by [facility] from home with Alcohol intoxication and a fall off his Truck.Marland Kitchen  HPI:  Patient was seen this morning in bed awake in the ER.  Patient rated depression 10/10 with 10 being severe depression.  He admitted Alcohol is  a huge problem for him.  He reported feeling hopeless and helpless.  He reported relapsing after only one month of sobriety after leaving [facility].  Patient reported that his fiance left him recently due to his drinking.  Patient reported that his 24 years old father encouraged him to come back to the hospital.  He was discharged from [facility] yesterday but he went home and drank a bottle of wine and a pint of Tequila.  Patient reported that he drinks to numb his emotion and for sleep.  He has three children and none ask about him.  His marriage ended in Divorce and now his fiance left him.  He reported that Alcohol has ruined his life.  Patient remains suicidal today but has no plan on what to do yet.  UDS was positive for Cannabis and Alcohol level 127.  Patient reported poor appetite and no sleep despite using Alcohol.  He admitted not complaint with his medications. We will seek inpatient hospitalization for treatment and alcohol treatment.  We will resume home medications as CIWA protocol is in place for  Alcohol withdrawal symptoms.  Patient denied HI/AVH and no mention of paranoia.  Past Psychiatric History:   Hx Depression anxiety.  Multiple inpatient Psychiatry hospitalization for Alcohol detox and Mental health treatment.  Last hospitalization to [facility] was May 11 th 2023 for suicide attempt by cutting his left wrist.  He has been hospitalized at [facility], [facility], [facility] and [facility]  Risk to Self or Others: Is the patient at risk to self? Yes Has the patient been a risk to self in the past 6 months? Yes Has the patient been a risk to self within the distant past? Yes Is the patient a risk to others? No Has the patient been a risk to others in the past 6 months? No Has the patient been a risk to others within the distant past? No  [place] Scale:  Flowsheet Row ED from 09/04/2021 in [facility] from 09/01/2021 in [facility] EMERGENCY DEPART[facility] ED from 08/14/2021 in [facility] at [facility] RISK CATEGORY High Risk High Risk No Risk       AIMS:  , , ,  ,   ASAM:    Substance Abuse:     Past Medical History:  Past Medical History:  Diagnosis Date   Alcohol abuse    COPD (chronic obstructive pulmonary disease) (HCC)    Depression    Hypertension     Past Surgical History:  Procedure Laterality Date   COLONOSCOPY     MOUTH  SURGERY     NO PAST SURGERIES     Family History:  Family History  Problem Relation Age of Onset   Heart attack Paternal Grandfather    Stroke Mother    Heart failure Mother    Heart failure Father    Family Psychiatric  History: Alcoholism-Parents, brother all suffers from Alcoholism Social History:  Social History   Substance and Sexual Activity  Alcohol Use Yes   Comment: BAC was .23 on admission     Social History   Substance and Sexual Activity  Drug Use Yes   Types: Marijuana   Comment: UDS was negative    Social History   Socioeconomic History    Marital status: Divorced    Spouse name: Not on file   Number of children: 2   Years of education: Not on file   Highest education level: Not on file  Occupational History   Occupation: Product/process development scientist  Tobacco Use   Smoking status: Some Days    Packs/day: 2.00    Types: Cigarettes   Smokeless tobacco: Never  Vaping Use   Vaping Use: Not on file  Substance and Sexual Activity   Alcohol use: Yes    Comment: BAC was .23 on admission   Drug use: Yes    Types: Marijuana    Comment: UDS was negative   Sexual activity: Not Currently  Other Topics Concern   Not on file  Social History Narrative   Pt stated that he lives alone; works as a Product/process development scientist   Social Determinants of Corporate investment banker Strain: Not on file  Food Insecurity: Not on file  Transportation Needs: Not on file  Physical Activity: Not on file  Stress: Not on file  Social Connections: Not on file   Additional Social History:    Allergies:   Allergies  Allergen Reactions   Penicillins Other (See Comments)    Childhood allergy Reaction:  Unknown  Has patient had a PCN reaction causing immediate rash, facial/tongue/throat swelling, SOB or lightheadedness with hypotension: Unknown Has patient had a PCN reaction causing severe rash involving mucus membranes or skin necrosis: Unknown Has patient had a PCN reaction that required hospitalization Unknown Has patient had a PCN reaction occurring within the last 10 years: No If all of the above answers are "NO", then may proceed with Cephalosporin use.   Statins Palpitations    Labs:  Results for orders placed or performed during the hospital encounter of 09/04/21 (from the past 48 hour(s))  Urine rapid drug screen (hosp performed)     Status: Abnormal   Collection Time: 09/04/21  4:59 AM  Result Value Ref Range   Opiates NONE DETECTED NONE DETECTED   Cocaine NONE DETECTED NONE DETECTED   Benzodiazepines NONE DETECTED NONE DETECTED    Amphetamines NONE DETECTED NONE DETECTED   Tetrahydrocannabinol POSITIVE (A) NONE DETECTED   Barbiturates NONE DETECTED NONE DETECTED    Comment: (NOTE) DRUG SCREEN FOR MEDICAL PURPOSES ONLY.  IF CONFIRMATION IS NEEDED FOR ANY PURPOSE, NOTIFY LAB WITHIN 5 DAYS.  LOWEST DETECTABLE LIMITS FOR URINE DRUG SCREEN Drug Class                     Cutoff (ng/mL) Amphetamine and metabolites    1000 Barbiturate and metabolites    200 Benzodiazepine                 200 Tricyclics and metabolites     300 Opiates and metabolites  300 Cocaine and metabolites        300 THC                            50 Performed at [facility], [address] [place], [place] [address]   Comprehensive metabolic panel     Status: None   Collection Time: 09/04/21  5:00 AM  Result Value Ref Range   Sodium 139 135 - 145 mmol/L   Potassium 3.9 3.5 - 5.1 mmol/L   Chloride 104 98 - 111 mmol/L   CO2 24 22 - 32 mmol/L   Glucose, Bld 86 70 - 99 mg/dL    Comment: Glucose reference range applies only to samples taken after fasting for at least 8 hours.   BUN 9 8 - 23 mg/dL   Creatinine, Ser 6.57 0.61 - 1.24 mg/dL   Calcium 9.3 8.9 - 84.6 mg/dL   Total Protein 7.2 6.5 - 8.1 g/dL   Albumin 4.5 3.5 - 5.0 g/dL   AST 19 15 - 41 U/L   ALT 19 0 - 44 U/L   Alkaline Phosphatase 90 38 - 126 U/L   Total Bilirubin 0.7 0.3 - 1.2 mg/dL   GFR, Estimated >96 >29 mL/min    Comment: (NOTE) Calculated using the CKD-EPI Creatinine Equation (2021)    Anion gap 11 5 - 15    Comment: Performed at [facility], [address] [place], [place] [address]  Salicylate level     Status: Abnormal   Collection Time: 09/04/21  5:00 AM  Result Value Ref Range   Salicylate Lvl <7.0 (L) 7.0 - 30.0 mg/dL    Comment: Performed at [facility], [address] [place], [place], [place] [address]  Acetaminophen level     Status: Abnormal   Collection Time: 09/04/21  5:00 AM  Result Value  Ref Range   Acetaminophen (Tylenol), Serum <10 (L) 10 - 30 ug/mL    Comment: (NOTE) Therapeutic concentrations vary significantly. A range of 10-30 ug/mL  may be an effective concentration for many patients. However, some  are best treated at concentrations outside of this range. Acetaminophen concentrations >150 ug/mL at 4 hours after ingestion  and >50 ug/mL at 12 hours after ingestion are often associated with  toxic reactions.  Performed at [facility], [address], [place], [place] [address]   Ethanol     Status: Abnormal   Collection Time: 09/04/21  5:00 AM  Result Value Ref Range   Alcohol, Ethyl (B) 127 (H) <10 mg/dL    Comment: (NOTE) Lowest detectable limit for serum alcohol is 10 mg/dL.  For medical purposes only. Performed at [facility], [address], [place], [place] [address]   CBC WITH DIFFERENTIAL     Status: Abnormal   Collection Time: 09/04/21  5:00 AM  Result Value Ref Range   WBC 9.6 4.0 - 10.5 K/uL   RBC 5.12 4.22 - 5.81 MIL/uL   Hemoglobin 17.0 13.0 - 17.0 g/dL   HCT 40.3 47.4 - 25.9 %   MCV 91.4 80.0 - 100.0 fL   MCH 33.2 26.0 - 34.0 pg   MCHC 36.3 (H) 30.0 - 36.0 g/dL   RDW 56.3 87.5 - 64.3 %   Platelets 171 150 - 400 K/uL   nRBC 0.0 0.0 - 0.2 %   Neutrophils Relative % 63 %   Neutro Abs 6.1 1.7 - 7.7 K/uL  Lymphocytes Relative 23 %   Lymphs Abs 2.2 0.7 - 4.0 K/uL   Monocytes Relative 9 %   Monocytes Absolute 0.9 0.1 - 1.0 K/uL   Eosinophils Relative 2 %   Eosinophils Absolute 0.2 0.0 - 0.5 K/uL   Basophils Relative 1 %   Basophils Absolute 0.1 0.0 - 0.1 K/uL   Immature Granulocytes 2 %   Abs Immature Granulocytes 0.15 (H) 0.00 - 0.07 K/uL    Comment: Performed at [facility], [address], [place], [place] [address]  Resp Panel by RT-PCR (Flu A&B, Covid) Anterior Nasal Swab     Status: None   Collection Time: 09/04/21  5:06 AM   Specimen: Anterior Nasal Swab  Result Value  Ref Range   SARS Coronavirus 2 by RT PCR NEGATIVE NEGATIVE    Comment: (NOTE) SARS-CoV-2 target nucleic acids are NOT DETECTED.  The SARS-CoV-2 RNA is generally detectable in upper respiratory specimens during the acute phase of infection. The lowest concentration of SARS-CoV-2 viral copies this assay can detect is 138 copies/mL. A negative result does not preclude SARS-Cov-2 infection and should not be used as the sole basis for treatment or other patient management decisions. A negative result may occur with  improper specimen collection/handling, submission of specimen other than nasopharyngeal swab, presence of viral mutation(s) within the areas targeted by this assay, and inadequate number of viral copies(<138 copies/mL). A negative result must be combined with clinical observations, patient history, and epidemiological information. The expected result is Negative.  Fact Sheet for Patients:  BloggerCourse.com  Fact Sheet for Healthcare Providers:  SeriousBroker.it  This test is no t yet approved or cleared by the [place] FDA and  has been authorized for detection and/or diagnosis of SARS-CoV-2 by FDA under an Emergency Use Authorization (EUA). This EUA will remain  in effect (meaning this test can be used) for the duration of the COVID-19 declaration under Section 564(b)(1) of the Act, 21 U.S.C.section 360bbb-3(b)(1), unless the authorization is terminated  or revoked sooner.       Influenza A by PCR NEGATIVE NEGATIVE   Influenza B by PCR NEGATIVE NEGATIVE    Comment: (NOTE) The Xpert Xpress SARS-CoV-2/FLU/RSV plus assay is intended as an aid in the diagnosis of influenza from Nasopharyngeal swab specimens and should not be used as a sole basis for treatment. Nasal washings and aspirates are unacceptable for Xpert Xpress SARS-CoV-2/FLU/RSV testing.  Fact Sheet for  Patients: BloggerCourse.com  Fact Sheet for Healthcare Providers: SeriousBroker.it  This test is not yet approved or cleared by the [place] FDA and has been authorized for detection and/or diagnosis of SARS-CoV-2 by FDA under an Emergency Use Authorization (EUA). This EUA will remain in effect (meaning this test can be used) for the duration of the COVID-19 declaration under Section 564(b)(1) of the Act, 21 U.S.C. section 360bbb-3(b)(1), unless the authorization is terminated or revoked.  Performed at [facility], [address], [place], [place] [address]   CBG monitoring, ED     Status: None   Collection Time: 09/04/21  5:36 AM  Result Value Ref Range   Glucose-Capillary 99 70 - 99 mg/dL    Comment: Glucose reference range applies only to samples taken after fasting for at least 8 hours.    Current Facility-Administered Medications  Medication Dose Route Frequency Provider Last Rate Last Admin   LORazepam (ATIVAN) injection 0-4 mg  0-4 mg Intravenous Q6H Roxy Horseman, PA-C   2 mg at 09/04/21 1047  Or   LORazepam (ATIVAN) tablet 0-4 mg  0-4 mg Oral Q6H Roxy Horseman, PA-C   1 mg at 09/04/21 0507   [START ON 09/06/2021] LORazepam (ATIVAN) injection 0-4 mg  0-4 mg Intravenous Q12H Roxy Horseman, PA-C       Or   [START ON 09/06/2021] LORazepam (ATIVAN) tablet 0-4 mg  0-4 mg Oral Q12H Roxy Horseman, PA-C       thiamine tablet 100 mg  100 mg Oral Daily Roxy Horseman, PA-C   100 mg at 09/04/21 0865   Or   thiamine (B-1) injection 100 mg  100 mg Intravenous Daily Roxy Horseman, PA-C       Current Outpatient Medications  Medication Sig Dispense Refill   clonazePAM (KLONOPIN) 1 MG tablet Take 1 tablet (1 mg total) by mouth 3 (three) times daily as needed for anxiety. 15 tablet 0   DULoxetine (CYMBALTA) 30 MG capsule Take 30 mg by mouth in the morning and at bedtime.     naltrexone (DEPADE) 50  MG tablet Take 50 mg by mouth daily.     pantoprazole (PROTONIX) 40 MG tablet Take 40 mg by mouth daily.     gabapentin (NEURONTIN) 300 MG capsule Take 1 capsule (300 mg total) by mouth 3 (three) times daily. (Patient not taking: Reported on 09/04/2021) 90 capsule 0    Musculoskeletal: Strength & Muscle Tone:  seen lying in bed  Gait & Station:  seen lying in bed Patient leans:  see above   Psychiatric Specialty Exam: Presentation  General Appearance: Disheveled; Casual  Eye Contact:Fair  Speech:Clear and Coherent; Normal Rate  Speech Volume:Normal  Handedness:Right   Mood and Affect  Mood:Anxious; Depressed; Hopeless; Worthless  Affect:Congruent; Depressed; Tearful   Thought Process  Thought Processes:Coherent; Goal Directed; Linear  Descriptions of Associations:Intact  Orientation:Full (Time, Place and Person)  Thought Content:Logical  History of Schizophrenia/Schizoaffective disorder:No  Duration of Psychotic Symptoms:No data recorded Hallucinations:Hallucinations: None  Ideas of Reference:None  Suicidal Thoughts:Suicidal Thoughts: Yes, Passive  Homicidal Thoughts:Homicidal Thoughts: No   Sensorium  Memory:Immediate Good; Recent Good; Remote Good  Judgment:Fair  Insight:Fair   Executive Functions  Concentration:Good  Attention Span:Good  Recall:Fair  Fund of Knowledge:Good  Language:Good   Psychomotor Activity  Psychomotor Activity:Psychomotor Activity: Normal   Assets  Assets:Communication Skills; Housing    Sleep  Sleep:Sleep: Fair   Physical Exam: Physical Exam Vitals (Disheveled, unkempt) and nursing note reviewed.  HENT:     Head: Normocephalic and atraumatic.     Nose: Nose normal.  Cardiovascular:     Rate and Rhythm: Normal rate.  Pulmonary:     Effort: Pulmonary effort is normal.  Musculoskeletal:        General: Tenderness present.     Cervical back: Normal range of motion.     Comments: Left rib cage sore  from fall last night off his Truck  Skin:    General: Skin is warm and dry.  Neurological:     General: No focal deficit present.     Mental Status: He is alert and oriented to person, place, and time.    Review of Systems  Constitutional: Negative.   HENT: Negative.    Respiratory: Negative.         Left rib area-tender  Cardiovascular: Negative.   Gastrointestinal: Negative.   Musculoskeletal: Negative.   Skin: Negative.   Neurological: Negative.   Endo/Heme/Allergies: Negative.   Psychiatric/Behavioral:  Positive for depression, substance abuse and suicidal ideas. The patient is nervous/anxious.  Blood pressure (!) 171/96, pulse 96, temperature 98.2 F (36.8 C), temperature source Oral, resp. rate 19, height 5\' 10"  (1.778 m), weight 63.5 kg, SpO2 95 %. Body mass index is 20.09 kg/m.  Medical Decision Making: Meets criteria for inpatient Psychiatric hospitalization.  Hx of previous recent suicide attempt by slashing his wrist.  Unable to contract for safety.  We will admit and treat in an inpatient Psychiatry unit.  Problem 1: Recurrent Major Depressive disorder, without Psychotic symptoms  Problem 2: Alcohol Dependence, severe  Problem 3: Cannabis abuse  Disposition:  Admit, seek placement  Earney Navy, NP-PMHNP-BC 09/04/2021 1:43 PM

## 2021-09-04 NOTE — ED Notes (Signed)
Patient changed out into burgandy scrubs. 1 patient belongings back located across from room 18.

## 2021-09-05 MED ORDER — DULOXETINE HCL 20 MG PO CPEP
40.0000 mg | ORAL_CAPSULE | Freq: Two times a day (BID) | ORAL | Status: DC
  Administered 2021-09-05 – 2021-09-06 (×2): 40 mg via ORAL
  Filled 2021-09-05 (×2): qty 2

## 2021-09-05 MED ORDER — OXYCODONE HCL 5 MG PO TABS
5.0000 mg | ORAL_TABLET | Freq: Once | ORAL | Status: AC
  Administered 2021-09-05: 5 mg via ORAL
  Filled 2021-09-05: qty 1

## 2021-09-05 MED ORDER — DULOXETINE HCL 30 MG PO CPEP
30.0000 mg | ORAL_CAPSULE | Freq: Every day | ORAL | Status: DC
  Administered 2021-09-05: 30 mg via ORAL
  Filled 2021-09-05: qty 1

## 2021-09-05 MED ORDER — PANTOPRAZOLE SODIUM 40 MG PO TBEC
40.0000 mg | DELAYED_RELEASE_TABLET | Freq: Every day | ORAL | Status: DC
  Administered 2021-09-05 – 2021-09-06 (×2): 40 mg via ORAL
  Filled 2021-09-05 (×2): qty 1

## 2021-09-05 MED ORDER — NALTREXONE HCL 50 MG PO TABS
50.0000 mg | ORAL_TABLET | Freq: Every day | ORAL | Status: DC
  Administered 2021-09-05 – 2021-09-06 (×2): 50 mg via ORAL
  Filled 2021-09-05 (×2): qty 1

## 2021-09-05 MED ORDER — ONDANSETRON 4 MG PO TBDP
4.0000 mg | ORAL_TABLET | Freq: Three times a day (TID) | ORAL | Status: DC | PRN
  Administered 2021-09-05: 4 mg via ORAL
  Filled 2021-09-05 (×2): qty 1

## 2021-09-05 MED ORDER — DULOXETINE HCL 30 MG PO CPEP: 30.0000 mg | ORAL_CAPSULE | Freq: Two times a day (BID) | ORAL | Status: DC

## 2021-09-05 MED ORDER — ONDANSETRON 4 MG PO TBDP: 4.0000 mg | ORAL_TABLET | Freq: Once | ORAL | Status: DC

## 2021-09-05 MED ORDER — CLONAZEPAM 1 MG PO TABS
1.0000 mg | ORAL_TABLET | Freq: Three times a day (TID) | ORAL | Status: DC | PRN
  Administered 2021-09-05 – 2021-09-06 (×4): 1 mg via ORAL
  Filled 2021-09-05 (×4): qty 1

## 2021-09-05 NOTE — ED Notes (Signed)
Pt has requested a shower, shower in room is not working therefore pt set up in another for his shower.

## 2021-09-05 NOTE — ED Provider Notes (Signed)
Emergency Medicine Observation Re-evaluation Note  Zachary [name] is a 61 y.o. male, seen on rounds today.  Pt initially presented to the ED for complaints of Fall and Suicidal  Currently, the patient is resting in bed comfortably. Requesting pain meds for left side pain and bruising after fall.   Physical Exam  BP 116/75 (BP Location: Left Arm)   Pulse 66   Temp 98.9 F (37.2 C) (Oral)   Resp 18   Ht 5\' 10"  (1.778 m)   Wt 63.5 kg   SpO2 97%   BMI 20.09 kg/m  Physical Exam Vitals and nursing note reviewed.  Constitutional:      Appearance: Normal appearance.  Cardiovascular:     Rate and Rhythm: Normal rate.  Pulmonary:     Effort: Pulmonary effort is normal.  Neurological:     Mental Status: He is alert and oriented to person, place, and time.      ED Course / MDM  EKG:EKG Interpretation  Date/Time:  Sunday September 05 2021 04:19:56 EDT Ventricular Rate:  56 PR Interval:  144 QRS Duration: 106 QT Interval:  418 QTC Calculation: 403 R Axis:   63 Text Interpretation: Sinus bradycardia Incomplete right bundle branch block Borderline ECG Confirmed by Tilden Fossa [id number]) on 09/05/2021 4:41:41 AM  I have reviewed the labs performed to date as well as medications administered while in observation.  Recent changes in the last 24 hours include no changes.   Plan  Current plan is for waiting for placement.  Zachary [name] is not under involuntary commitment.     Edwin Dada P, DO 09/05/21 1223

## 2021-09-05 NOTE — Progress Notes (Addendum)
[name] Psych ED Progress Note  09/05/2021 1:19 PM Zachary [name]  MRN:  [id number]   Subjective:  Zachary [name] is a 61 y.o. male patient admitted with long hx of Depression, anxiety and Alcohol dependence was brought in by [facility] from home with Alcohol intoxication and a fall off his Truck. Patient was seen this morning.  He remains suicidal and today stating that his anxiety and not Alcohol is his problem.  Patient is taking Clonazepam as needed and he want same to be increased to four times a day.  He is already getting Ativan per CIWA Protocol.  We will continue to seek bed placement while we keep treating him.   He denied HI/AVH and no paranoia.  Patient reported that if he let go without adequate care he will use the wires in the room to kill himself.  RN is made aware of this threat. Principal Problem: MDD (major depressive disorder), recurrent severe, without psychosis (HCC) Diagnosis:  Principal Problem:   MDD (major depressive disorder), recurrent severe, without psychosis (HCC) Active Problems:   Alcohol use disorder, severe, dependence (HCC)   ED Assessment Time Calculation: Start Time: 1253 Stop Time: 1318 Total Time in Minutes (Assessment Completion): 25   Past Psychiatric History: see initial psychiatry evaluation  [place] Scale:  Flowsheet Row ED from 09/04/2021 in [facility] from 09/01/2021 in [facility] EMERGENCY DEPARTMENT ED from 08/14/2021 in [facility] RISK CATEGORY High Risk High Risk No Risk       Past Medical History:  Past Medical History:  Diagnosis Date   Alcohol abuse    COPD (chronic obstructive pulmonary disease) (HCC)    Depression    Hypertension     Past Surgical History:  Procedure Laterality Date   COLONOSCOPY     MOUTH SURGERY     NO PAST SURGERIES     Family History:  Family History  Problem Relation Age of Onset   Heart attack Paternal  Grandfather    Stroke Mother    Heart failure Mother    Heart failure Father    Family Psychiatric  History: see initial psychiatry evaluation  Social History:  Social History   Substance and Sexual Activity  Alcohol Use Yes   Comment: BAC was .23 on admission     Social History   Substance and Sexual Activity  Drug Use Yes   Types: Marijuana   Comment: UDS was negative    Social History   Socioeconomic History   Marital status: Divorced    Spouse name: Not on file   Number of children: 2   Years of education: Not on file   Highest education level: Not on file  Occupational History   Occupation: Product/process development scientist  Tobacco Use   Smoking status: Some Days    Packs/day: 2.00    Types: Cigarettes   Smokeless tobacco: Never  Vaping Use   Vaping Use: Not on file  Substance and Sexual Activity   Alcohol use: Yes    Comment: BAC was .23 on admission   Drug use: Yes    Types: Marijuana    Comment: UDS was negative   Sexual activity: Not Currently  Other Topics Concern   Not on file  Social History Narrative   Pt stated that he lives alone; works as a Product/process development scientist   Social Determinants of Corporate investment banker Strain: Not on file  Food Insecurity: Not  on file  Transportation Needs: Not on file  Physical Activity: Not on file  Stress: Not on file  Social Connections: Not on file    Sleep: Good  Appetite:  Fair  Current Medications: Current Facility-Administered Medications  Medication Dose Route Frequency Provider Last Rate Last Admin   clonazePAM (KLONOPIN) tablet 1 mg  1 mg Oral TID PRN Edwin Dada P, DO   1 mg at 09/05/21 0954   DULoxetine (CYMBALTA) DR capsule 30 mg  30 mg Oral BID Edwin Dada P, DO       LORazepam (ATIVAN) injection 0-4 mg  0-4 mg Intravenous Q6H Roxy Horseman, PA-C   2 mg at 09/04/21 1047   Or   LORazepam (ATIVAN) tablet 0-4 mg  0-4 mg Oral Q6H Roxy Horseman, PA-C   1 mg at 09/05/21 1111   [START ON 09/06/2021]  LORazepam (ATIVAN) injection 0-4 mg  0-4 mg Intravenous Q12H Roxy Horseman, PA-C       Or   [START ON 09/06/2021] LORazepam (ATIVAN) tablet 0-4 mg  0-4 mg Oral Q12H Roxy Horseman, PA-C       naltrexone (DEPADE) tablet 50 mg  50 mg Oral Daily Edwin Dada P, DO   50 mg at 09/05/21 1312   ondansetron (ZOFRAN-ODT) disintegrating tablet 4 mg  4 mg Oral Q8H PRN Tilden Fossa, MD   4 mg at 09/05/21 0450   pantoprazole (PROTONIX) EC tablet 40 mg  40 mg Oral Daily Edwin Dada P, DO   40 mg at 09/05/21 0947   thiamine tablet 100 mg  100 mg Oral Daily Roxy Horseman, PA-C   100 mg at 09/05/21 2694   Or   thiamine (B-1) injection 100 mg  100 mg Intravenous Daily Roxy Horseman, PA-C       Current Outpatient Medications  Medication Sig Dispense Refill   clonazePAM (KLONOPIN) 1 MG tablet Take 1 tablet (1 mg total) by mouth 3 (three) times daily as needed for anxiety. 15 tablet 0   DULoxetine (CYMBALTA) 30 MG capsule Take 30 mg by mouth in the morning and at bedtime.     naltrexone (DEPADE) 50 MG tablet Take 50 mg by mouth daily.     pantoprazole (PROTONIX) 40 MG tablet Take 40 mg by mouth daily.     gabapentin (NEURONTIN) 300 MG capsule Take 1 capsule (300 mg total) by mouth 3 (three) times daily. (Patient not taking: Reported on 09/04/2021) 90 capsule 0    Lab Results:  Results for orders placed or performed during the hospital encounter of 09/04/21 (from the past 48 hour(s))  Urine rapid drug screen (hosp performed)     Status: Abnormal   Collection Time: 09/04/21  4:59 AM  Result Value Ref Range   Opiates NONE DETECTED NONE DETECTED   Cocaine NONE DETECTED NONE DETECTED   Benzodiazepines NONE DETECTED NONE DETECTED   Amphetamines NONE DETECTED NONE DETECTED   Tetrahydrocannabinol POSITIVE (A) NONE DETECTED   Barbiturates NONE DETECTED NONE DETECTED    Comment: (NOTE) DRUG SCREEN FOR MEDICAL PURPOSES ONLY.  IF CONFIRMATION IS NEEDED FOR ANY PURPOSE, NOTIFY LAB WITHIN 5  DAYS.  LOWEST DETECTABLE LIMITS FOR URINE DRUG SCREEN Drug Class                     Cutoff (ng/mL) Amphetamine and metabolites    1000 Barbiturate and metabolites    200 Benzodiazepine                 200 Tricyclics and  metabolites     300 Opiates and metabolites        300 Cocaine and metabolites        300 THC                            50 Performed at [facility], [address], [place], [place] [address]   Comprehensive metabolic panel     Status: None   Collection Time: 09/04/21  5:00 AM  Result Value Ref Range   Sodium 139 135 - 145 mmol/L   Potassium 3.9 3.5 - 5.1 mmol/L   Chloride 104 98 - 111 mmol/L   CO2 24 22 - 32 mmol/L   Glucose, Bld 86 70 - 99 mg/dL    Comment: Glucose reference range applies only to samples taken after fasting for at least 8 hours.   BUN 9 8 - 23 mg/dL   Creatinine, Ser 6.96 0.61 - 1.24 mg/dL   Calcium 9.3 8.9 - 29.5 mg/dL   Total Protein 7.2 6.5 - 8.1 g/dL   Albumin 4.5 3.5 - 5.0 g/dL   AST 19 15 - 41 U/L   ALT 19 0 - 44 U/L   Alkaline Phosphatase 90 38 - 126 U/L   Total Bilirubin 0.7 0.3 - 1.2 mg/dL   GFR, Estimated >28 >41 mL/min    Comment: (NOTE) Calculated using the CKD-EPI Creatinine Equation (2021)    Anion gap 11 5 - 15    Comment: Performed at [facility], [address] [place], [place] [address]  Salicylate level     Status: Abnormal   Collection Time: 09/04/21  5:00 AM  Result Value Ref Range   Salicylate Lvl <7.0 (L) 7.0 - 30.0 mg/dL    Comment: Performed at [facility], [address] [place], [place] [address]  Acetaminophen level     Status: Abnormal   Collection Time: 09/04/21  5:00 AM  Result Value Ref Range   Acetaminophen (Tylenol), Serum <10 (L) 10 - 30 ug/mL    Comment: (NOTE) Therapeutic concentrations vary significantly. A range of 10-30 ug/mL  may be an effective concentration for many patients. However, some  are best treated at concentrations  outside of this range. Acetaminophen concentrations >150 ug/mL at 4 hours after ingestion  and >50 ug/mL at 12 hours after ingestion are often associated with  toxic reactions.  Performed at [facility], [address], [place], [place] [address]   Ethanol     Status: Abnormal   Collection Time: 09/04/21  5:00 AM  Result Value Ref Range   Alcohol, Ethyl (B) 127 (H) <10 mg/dL    Comment: (NOTE) Lowest detectable limit for serum alcohol is 10 mg/dL.  For medical purposes only. Performed at [facility], [address], [place], [place] [address]   CBC WITH DIFFERENTIAL     Status: Abnormal   Collection Time: 09/04/21  5:00 AM  Result Value Ref Range   WBC 9.6 4.0 - 10.5 K/uL   RBC 5.12 4.22 - 5.81 MIL/uL   Hemoglobin 17.0 13.0 - 17.0 g/dL   HCT 42.5 95.6 - 38.7 %   MCV 91.4 80.0 - 100.0 fL   MCH 33.2 26.0 - 34.0 pg   MCHC 36.3 (H) 30.0 - 36.0 g/dL   RDW 56.4 33.2 - 95.1 %   Platelets 171 150 - 400 K/uL   nRBC 0.0 0.0 - 0.2 %  Neutrophils Relative % 63 %   Neutro Abs 6.1 1.7 - 7.7 K/uL   Lymphocytes Relative 23 %   Lymphs Abs 2.2 0.7 - 4.0 K/uL   Monocytes Relative 9 %   Monocytes Absolute 0.9 0.1 - 1.0 K/uL   Eosinophils Relative 2 %   Eosinophils Absolute 0.2 0.0 - 0.5 K/uL   Basophils Relative 1 %   Basophils Absolute 0.1 0.0 - 0.1 K/uL   Immature Granulocytes 2 %   Abs Immature Granulocytes 0.15 (H) 0.00 - 0.07 K/uL    Comment: Performed at [facility], [address], [place], [place] [address]  Resp Panel by RT-PCR (Flu A&B, Covid) Anterior Nasal Swab     Status: None   Collection Time: 09/04/21  5:06 AM   Specimen: Anterior Nasal Swab  Result Value Ref Range   SARS Coronavirus 2 by RT PCR NEGATIVE NEGATIVE    Comment: (NOTE) SARS-CoV-2 target nucleic acids are NOT DETECTED.  The SARS-CoV-2 RNA is generally detectable in upper respiratory specimens during the acute phase of infection. The  lowest concentration of SARS-CoV-2 viral copies this assay can detect is 138 copies/mL. A negative result does not preclude SARS-Cov-2 infection and should not be used as the sole basis for treatment or other patient management decisions. A negative result may occur with  improper specimen collection/handling, submission of specimen other than nasopharyngeal swab, presence of viral mutation(s) within the areas targeted by this assay, and inadequate number of viral copies(<138 copies/mL). A negative result must be combined with clinical observations, patient history, and epidemiological information. The expected result is Negative.  Fact Sheet for Patients:  BloggerCourse.com  Fact Sheet for Healthcare Providers:  SeriousBroker.it  This test is no t yet approved or cleared by the [place] FDA and  has been authorized for detection and/or diagnosis of SARS-CoV-2 by FDA under an Emergency Use Authorization (EUA). This EUA will remain  in effect (meaning this test can be used) for the duration of the COVID-19 declaration under Section 564(b)(1) of the Act, 21 U.S.C.section 360bbb-3(b)(1), unless the authorization is terminated  or revoked sooner.       Influenza A by PCR NEGATIVE NEGATIVE   Influenza B by PCR NEGATIVE NEGATIVE    Comment: (NOTE) The Xpert Xpress SARS-CoV-2/FLU/RSV plus assay is intended as an aid in the diagnosis of influenza from Nasopharyngeal swab specimens and should not be used as a sole basis for treatment. Nasal washings and aspirates are unacceptable for Xpert Xpress SARS-CoV-2/FLU/RSV testing.  Fact Sheet for Patients: BloggerCourse.com  Fact Sheet for Healthcare Providers: SeriousBroker.it  This test is not yet approved or cleared by the [place] FDA and has been authorized for detection and/or diagnosis of SARS-CoV-2 by FDA under an Emergency  Use Authorization (EUA). This EUA will remain in effect (meaning this test can be used) for the duration of the COVID-19 declaration under Section 564(b)(1) of the Act, 21 U.S.C. section 360bbb-3(b)(1), unless the authorization is terminated or revoked.  Performed at [facility], [address], [place], [place] [address]   CBG monitoring, ED     Status: None   Collection Time: 09/04/21  5:36 AM  Result Value Ref Range   Glucose-Capillary 99 70 - 99 mg/dL    Comment: Glucose reference range applies only to samples taken after fasting for at least 8 hours.    Blood Alcohol level:  Lab Results  Component Value Date   ETH 127 (H) 09/04/2021   ETH 154 (  H) 09/01/2021    Physical Findings:  CIWA:  CIWA-Ar Total: 7 COWS:     Musculoskeletal: Strength & Muscle Tone: within normal limits Gait & Station: normal Patient leans: Front  Psychiatric Specialty Exam:  Presentation  General Appearance: Casual  Eye Contact:Good  Speech:Clear and Coherent; Normal Rate  Speech Volume:Normal  Handedness:Right   Mood and Affect  Mood:Anxious; Depressed; Hopeless  Affect:Congruent   Thought Process  Thought Processes:Coherent; Goal Directed; Linear  Descriptions of Associations:Intact  Orientation:Full (Time, Place and Person)  Thought Content:Logical  History of Schizophrenia/Schizoaffective disorder:No  Duration of Psychotic Symptoms:No data recorded Hallucinations:Hallucinations: None  Ideas of Reference:None  Suicidal Thoughts:Suicidal Thoughts: Yes, Active SI Active Intent and/or Plan: With Intent; With Plan; With Means to Carry Out; With Access to Means (reported he could use the electrical wires to kill self.)  Homicidal Thoughts:Homicidal Thoughts: No   Sensorium  Memory:Immediate Good; Recent Good; Remote Good  Judgment:Poor  Insight:Poor   Executive Functions  Concentration:Poor  Attention Span:Poor  Recall:Good  Fund of  Knowledge:Good  Language:Good   Psychomotor Activity  Psychomotor Activity:Psychomotor Activity: Normal   Assets  Assets:Communication Skills; Housing; Desire for Improvement   Sleep  Sleep:Sleep: Good    Physical Exam: Physical Exam Vitals and nursing note reviewed.  Constitutional:      Appearance: Normal appearance.  HENT:     Head: Normocephalic and atraumatic.     Nose: Nose normal.  Cardiovascular:     Rate and Rhythm: Normal rate and regular rhythm.  Pulmonary:     Effort: Pulmonary effort is normal.  Musculoskeletal:        General: Normal range of motion.     Cervical back: Normal range of motion.  Skin:    General: Skin is warm and dry.  Neurological:     General: No focal deficit present.     Mental Status: He is alert and oriented to person, place, and time.    Review of Systems  Constitutional: Negative.   HENT: Negative.    Eyes: Negative.   Respiratory: Negative.    Cardiovascular: Negative.   Gastrointestinal: Negative.   Genitourinary: Negative.   Musculoskeletal: Negative.   Skin: Negative.   Neurological: Negative.   Endo/Heme/Allergies: Negative.   Psychiatric/Behavioral:  Positive for depression, substance abuse and suicidal ideas.    Blood pressure 116/75, pulse 66, temperature 98.9 F (37.2 C), temperature source Oral, resp. rate 18, height 5\' 10"  (1.778 m), weight 63.5 kg, SpO2 97 %. Body mass index is 20.09 kg/m.   Medical Decision Making: Continue seeking inpatient hospitalization , continue treating while seeking placement.  Problem 1: Recurrent Major Depressive disorder, without Psychotic symptoms    Problem 2: Alcohol Dependence, severe  Problem 3: Cannabis abuse  Earney Navy, NP-PMHNP-BC 09/05/2021, 1:19 PM

## 2021-09-05 NOTE — ED Notes (Signed)
Received call from [facility] about patient transfer. It was stated that they were waiting upon patient arrival. This nurse spoke with NP who stated It was communicated to [facility] staff about pts suicide threats as well as pt medication change complaints. This nurse reached back out to [facility] staff who stated that they will not be able to take patient at this time. NP made aware as well as ED charge nurse.

## 2021-09-06 ENCOUNTER — Other Ambulatory Visit: Payer: Self-pay

## 2021-09-06 ENCOUNTER — Other Ambulatory Visit (HOSPITAL_COMMUNITY)
Admission: EM | Admit: 2021-09-06 | Discharge: 2021-09-09 | Disposition: A | Discharge status: Home / Self Care | Payer: Medicaid Other | Attending: Psychiatry | Admitting: Psychiatry

## 2021-09-06 DIAGNOSIS — F1514 Other stimulant abuse with stimulant-induced mood disorder: Secondary | ICD-10-CM | POA: Insufficient documentation

## 2021-09-06 DIAGNOSIS — I10 Essential (primary) hypertension: Secondary | ICD-10-CM | POA: Insufficient documentation

## 2021-09-06 DIAGNOSIS — F332 Major depressive disorder, recurrent severe without psychotic features: Secondary | ICD-10-CM | POA: Diagnosis not present

## 2021-09-06 DIAGNOSIS — Z79899 Other long term (current) drug therapy: Secondary | ICD-10-CM | POA: Insufficient documentation

## 2021-09-06 DIAGNOSIS — F1020 Alcohol dependence, uncomplicated: Secondary | ICD-10-CM | POA: Diagnosis not present

## 2021-09-06 DIAGNOSIS — F1994 Other psychoactive substance use, unspecified with psychoactive substance-induced mood disorder: Secondary | ICD-10-CM | POA: Diagnosis present

## 2021-09-06 DIAGNOSIS — R0781 Pleurodynia: Secondary | ICD-10-CM | POA: Insufficient documentation

## 2021-09-06 DIAGNOSIS — F1310 Sedative, hypnotic or anxiolytic abuse, uncomplicated: Secondary | ICD-10-CM

## 2021-09-06 DIAGNOSIS — Y906 Blood alcohol level of 120-199 mg/100 ml: Secondary | ICD-10-CM | POA: Insufficient documentation

## 2021-09-06 MED ORDER — CHLORDIAZEPOXIDE HCL 25 MG PO CAPS: 25.0000 mg | ORAL_CAPSULE | Freq: Four times a day (QID) | ORAL | Status: DC | PRN

## 2021-09-06 MED ORDER — CHLORDIAZEPOXIDE HCL 25 MG PO CAPS
25.0000 mg | ORAL_CAPSULE | Freq: Every day | ORAL | Status: AC
  Administered 2021-09-09: 25 mg via ORAL
  Filled 2021-09-06: qty 1

## 2021-09-06 MED ORDER — THIAMINE HCL 100 MG/ML IJ SOLN
100.0000 mg | Freq: Once | INTRAMUSCULAR | Status: AC
Start: 2021-09-06 — End: 2021-09-06
  Administered 2021-09-06: 100 mg via INTRAMUSCULAR
  Filled 2021-09-06: qty 2

## 2021-09-06 MED ORDER — ONDANSETRON 4 MG PO TBDP
4.0000 mg | ORAL_TABLET | Freq: Four times a day (QID) | ORAL | Status: DC | PRN
  Administered 2021-09-07 (×2): 4 mg via ORAL
  Filled 2021-09-06 (×2): qty 1

## 2021-09-06 MED ORDER — LOPERAMIDE HCL 2 MG PO CAPS: 2.0000 mg | ORAL_CAPSULE | ORAL | Status: DC | PRN

## 2021-09-06 MED ORDER — TRAZODONE HCL 50 MG PO TABS: 50.0000 mg | ORAL_TABLET | Freq: Every evening | ORAL | Status: DC | PRN

## 2021-09-06 MED ORDER — DULOXETINE HCL 60 MG PO CPEP
60.0000 mg | ORAL_CAPSULE | Freq: Every day | ORAL | Status: DC
  Administered 2021-09-06 – 2021-09-09 (×4): 60 mg via ORAL
  Filled 2021-09-06: qty 1
  Filled 2021-09-06: qty 7
  Filled 2021-09-06 (×3): qty 1

## 2021-09-06 MED ORDER — MAGNESIUM HYDROXIDE 400 MG/5ML PO SUSP: 30.0000 mL | Freq: Every day | ORAL | Status: DC | PRN

## 2021-09-06 MED ORDER — HYDROXYZINE HCL 10 MG PO TABS
10.0000 mg | ORAL_TABLET | Freq: Three times a day (TID) | ORAL | Status: DC | PRN
  Administered 2021-09-08 – 2021-09-09 (×4): 10 mg via ORAL
  Filled 2021-09-06 (×4): qty 1

## 2021-09-06 MED ORDER — HYDROXYZINE HCL 25 MG PO TABS: 25.0000 mg | ORAL_TABLET | Freq: Four times a day (QID) | ORAL | Status: DC | PRN

## 2021-09-06 MED ORDER — GABAPENTIN 300 MG PO CAPS
300.0000 mg | ORAL_CAPSULE | Freq: Three times a day (TID) | ORAL | Status: DC
  Administered 2021-09-06 – 2021-09-09 (×9): 300 mg via ORAL
  Filled 2021-09-06 (×9): qty 1
  Filled 2021-09-06: qty 21

## 2021-09-06 MED ORDER — ADULT MULTIVITAMIN W/MINERALS CH
1.0000 | ORAL_TABLET | Freq: Every day | ORAL | Status: DC
Start: 2021-09-06 — End: 2021-09-09
  Administered 2021-09-06 – 2021-09-09 (×4): 1 via ORAL
  Filled 2021-09-06 (×4): qty 1

## 2021-09-06 MED ORDER — ALUM & MAG HYDROXIDE-SIMETH 200-200-20 MG/5ML PO SUSP: 30.0000 mL | ORAL | Status: DC | PRN

## 2021-09-06 MED ORDER — CHLORDIAZEPOXIDE HCL 25 MG PO CAPS
25.0000 mg | ORAL_CAPSULE | Freq: Four times a day (QID) | ORAL | Status: AC
  Administered 2021-09-06 (×3): 25 mg via ORAL
  Filled 2021-09-06 (×3): qty 1

## 2021-09-06 MED ORDER — CHLORDIAZEPOXIDE HCL 25 MG PO CAPS
25.0000 mg | ORAL_CAPSULE | Freq: Three times a day (TID) | ORAL | Status: AC
  Administered 2021-09-07 (×3): 25 mg via ORAL
  Filled 2021-09-06 (×3): qty 1

## 2021-09-06 MED ORDER — CHLORDIAZEPOXIDE HCL 25 MG PO CAPS
25.0000 mg | ORAL_CAPSULE | ORAL | Status: AC
  Administered 2021-09-08 (×2): 25 mg via ORAL
  Filled 2021-09-06 (×2): qty 1

## 2021-09-06 MED ORDER — THIAMINE HCL 100 MG PO TABS
100.0000 mg | ORAL_TABLET | Freq: Every day | ORAL | Status: DC
Start: 2021-09-07 — End: 2021-09-09
  Administered 2021-09-07 – 2021-09-09 (×3): 100 mg via ORAL
  Filled 2021-09-06 (×3): qty 1

## 2021-09-06 MED ORDER — ACETAMINOPHEN 325 MG PO TABS
650.0000 mg | ORAL_TABLET | Freq: Four times a day (QID) | ORAL | Status: DC | PRN
  Administered 2021-09-07 – 2021-09-08 (×6): 650 mg via ORAL
  Filled 2021-09-06 (×6): qty 2

## 2021-09-06 NOTE — ED Notes (Signed)
Gave pt. A snack and juice and a sandwich.

## 2021-09-06 NOTE — ED Notes (Addendum)
Attempted to call report to [phone]. No answer.   Doylene Canning, counselor made aware.

## 2021-09-06 NOTE — ED Notes (Signed)
Attempted to call report to call report to [phone]. No answer at this time. Will continue to monitor and call back.   Voluntary consent and consent to release forms have been signed and at nurses station.   MD aware of EMTALA.

## 2021-09-06 NOTE — ED Notes (Signed)
Pt is in the bed sleeping. Respirations are even and unlabored. No acute distress noted. Will continue to monitor for safety. 

## 2021-09-06 NOTE — ED Provider Notes (Signed)
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 at [facility], [address] [place], [place] [address]   Salicylate Lvl 09/01/2021 <7.0 (L)  7.0 - 30.0 mg/dL Final   Performed at [facility], [address], [place], [place] [address]   Acetaminophen  (Tylenol), Serum 09/01/2021 <10 (L)  10 - 30 ug/mL Final   Comment: (NOTE) Therapeutic concentrations vary significantly. A range of 10-30 ug/mL  may be an effective concentration for many patients. However, some  are best treated at concentrations outside of this range. Acetaminophen concentrations >150 ug/mL at 4 hours after ingestion  and >50 ug/mL at 12 hours after ingestion are often associated with  toxic reactions.  Performed at [facility], [address], [place], [place] [address]   Admission on 07/22/2021, Discharged on 07/24/2021  Component Date Value Ref Range Status   Hgb A1c MFr Bld 07/22/2021 4.9  4.8 - 5.6 % Final   Comment: (NOTE) Pre diabetes:          5.7%-6.4%  Diabetes:              >6.4%  Glycemic control for   <7.0% adults with diabetes    Mean Plasma Glucose 07/22/2021 93.93  mg/dL Final   Performed at [facility], [address], [place], [place] [address]   Cholesterol 07/22/2021 224 (H)  0 - 200 mg/dL Final   Triglycerides 25/95/6387 173 (H)  <150 mg/dL Final   HDL 56/43/3295  95  >40 mg/dL Final   Total CHOL/HDL Ratio 07/22/2021 2.4  RATIO Final   VLDL 07/22/2021 35  0 - 40 mg/dL Final   LDL Cholesterol 07/22/2021 94  0 - 99 mg/dL Final   Comment:        Total Cholesterol/HDL:CHD Risk Coronary Heart Disease Risk Table                     Men   Women  1/2 Average Risk   3.4   3.3  Average Risk       5.0   4.4  2 X Average Risk   9.6   7.1  3 X Average Risk  23.4   11.0        Use the calculated Patient Ratio above and the CHD Risk Table to determine the patient's CHD Risk.        ATP III CLASSIFICATION (LDL):  <100     mg/dL   Optimal  811-914  mg/dL   Near or Above                    Optimal  130-159  mg/dL   Borderline  782-956  mg/dL   High  >213     mg/dL   Very High Performed at [facility], [address], [place], [place] [address]    TSH 07/22/2021 2.302  0.350 - 4.500 uIU/mL Final   Comment:  Performed by a 3rd Generation assay with a functional sensitivity of <=0.01 uIU/mL. Performed at [facility], [address], [place], [place] [address]   Admission on 07/19/2021, Discharged on 07/22/2021  Component Date Value Ref Range Status   SARS Coronavirus 2 by RT PCR 07/19/2021 NEGATIVE  NEGATIVE Final   Comment: (NOTE) SARS-CoV-2 target nucleic acids are NOT DETECTED.  The SARS-CoV-2 RNA is generally detectable in upper respiratory specimens during the acute phase of infection. The lowest concentration of SARS-CoV-2 viral copies this assay can detect is 138 copies/mL. A negative result does not preclude SARS-Cov-2 infection and should not be used as the sole basis for treatment or other patient management decisions. A negative result may occur with  improper specimen collection/handling, submission of specimen other than nasopharyngeal swab, presence of viral mutation(s) within the areas targeted by this assay, and inadequate number of viral copies(<138 copies/mL). A negative result must be combined with clinical observations, patient history, and epidemiological information. The expected result is Negative.  Fact Sheet for Patients:  BloggerCourse.com  Fact Sheet for Healthcare Providers:  SeriousBroker.it  This test is no                          t yet approved or cleared by the [place] FDA and  has been authorized for detection and/or diagnosis of SARS-CoV-2 by FDA under an Emergency Use Authorization (EUA). This EUA will remain  in effect (meaning this test can be used) for the duration of the COVID-19 declaration under Section 564(b)(1) of the Act, 21 U.S.C.section 360bbb-3(b)(1), unless the authorization is terminated  or revoked sooner.       Influenza A by PCR 07/19/2021 NEGATIVE  NEGATIVE Final   Influenza B by PCR 07/19/2021 NEGATIVE  NEGATIVE Final   Comment: (NOTE) The Xpert Xpress  SARS-CoV-2/FLU/RSV plus assay is intended as an aid in the diagnosis of influenza from Nasopharyngeal swab specimens and should not be used as a sole basis for treatment.  Nasal washings and aspirates are unacceptable for Xpert Xpress SARS-CoV-2/FLU/RSV testing.  Fact Sheet for Patients: BloggerCourse.com  Fact Sheet for Healthcare Providers: SeriousBroker.it  This test is not yet approved or cleared by the [place] FDA and has been authorized for detection and/or diagnosis of SARS-CoV-2 by FDA under an Emergency Use Authorization (EUA). This EUA will remain in effect (meaning this test can be used) for the duration of the COVID-19 declaration under Section 564(b)(1) of the Act, 21 U.S.C. section 360bbb-3(b)(1), unless the authorization is terminated or revoked.  Performed at [facility], [address] [place], [place] [address]    Sodium 07/19/2021 139  135 - 145 mmol/L Final   Potassium 07/19/2021 3.9  3.5 - 5.1 mmol/L Final   Chloride 07/19/2021 104  98 - 111 mmol/L Final   CO2 07/19/2021 26  22 - 32 mmol/L Final   Glucose, Bld 07/19/2021 86  70 - 99 mg/dL Final   Glucose reference range applies only to samples taken after fasting for at least 8 hours.   BUN 07/19/2021 7 (L)  8 - 23 mg/dL Final   Creatinine, Ser 07/19/2021 0.84  0.61 - 1.24 mg/dL Final   Calcium 62/95/2841 9.1  8.9 - 10.3 mg/dL Final   Total Protein 32/44/0102 6.1 (L)  6.5 - 8.1 g/dL Final   Albumin 72/53/6644 3.7  3.5 - 5.0 g/dL Final   AST 03/47/4259 38  15 - 41 U/L Final   ALT 07/19/2021 28  0 - 44 U/L Final   Alkaline Phosphatase 07/19/2021 80  38 - 126 U/L Final   Total Bilirubin 07/19/2021 1.1  0.3 - 1.2 mg/dL Final   GFR, Estimated 07/19/2021 >60  >60 mL/min Final   Comment: (NOTE) Calculated using the CKD-EPI Creatinine Equation (2021)    Anion gap 07/19/2021 9  5 - 15 Final   Performed at [facility], [address]  [place], [place] [address]   Alcohol, Ethyl (B) 07/19/2021 33 (H)  <10 mg/dL Final   Comment: (NOTE) Lowest detectable limit for serum alcohol is 10 mg/dL.  For medical purposes only. Performed at [facility], [address], [place], [place] [address]    Opiates 07/19/2021 NONE DETECTED  NONE DETECTED Final   Cocaine 07/19/2021 NONE DETECTED  NONE DETECTED Final   Benzodiazepines 07/19/2021 POSITIVE (A)  NONE DETECTED Final   Amphetamines 07/19/2021 NONE DETECTED  NONE DETECTED Final   Tetrahydrocannabinol 07/19/2021 NONE DETECTED  NONE DETECTED Final   Barbiturates 07/19/2021 NONE DETECTED  NONE DETECTED Final   Comment: (NOTE) DRUG SCREEN FOR MEDICAL PURPOSES ONLY.  IF CONFIRMATION IS NEEDED FOR ANY PURPOSE, NOTIFY LAB WITHIN 5 DAYS.  LOWEST DETECTABLE LIMITS FOR URINE DRUG SCREEN Drug Class                     Cutoff (ng/mL) Amphetamine and metabolites    1000 Barbiturate and metabolites    200 Benzodiazepine                 200 Tricyclics and metabolites     300 Opiates and metabolites        300 Cocaine and metabolites        300 THC                            50 Performed at [facility], [address], [place], [place] [address]  WBC 07/19/2021 8.8  4.0 - 10.5 K/uL Final   RBC 07/19/2021 4.16 (L)  4.22 - 5.81 MIL/uL Final   Hemoglobin 07/19/2021 13.7  13.0 - 17.0 g/dL Final   HCT 16/12/9602 38.2 (L)  39.0 - 52.0 % Final   MCV 07/19/2021 91.8  80.0 - 100.0 fL Final   MCH 07/19/2021 32.9  26.0 - 34.0 pg Final   MCHC 07/19/2021 35.9  30.0 - 36.0 g/dL Final   RDW 54/11/8117 12.3  11.5 - 15.5 % Final   Platelets 07/19/2021 164  150 - 400 K/uL Final   nRBC 07/19/2021 0.0  0.0 - 0.2 % Final   Neutrophils Relative % 07/19/2021 58  % Final   Neutro Abs 07/19/2021 5.2  1.7 - 7.7 K/uL Final   Lymphocytes Relative 07/19/2021 23  % Final   Lymphs Abs 07/19/2021 2.0  0.7 - 4.0 K/uL Final   Monocytes Relative 07/19/2021 15  % Final   Monocytes Absolute 07/19/2021  1.3 (H)  0.1 - 1.0 K/uL Final   Eosinophils Relative 07/19/2021 1  % Final   Eosinophils Absolute 07/19/2021 0.1  0.0 - 0.5 K/uL Final   Basophils Relative 07/19/2021 1  % Final   Basophils Absolute 07/19/2021 0.1  0.0 - 0.1 K/uL Final   Immature Granulocytes 07/19/2021 2  % Final   Abs Immature Granulocytes 07/19/2021 0.14 (H)  0.00 - 0.07 K/uL Final   Performed at [facility], [address], [place], [place] [address]    Allergies: Penicillins and Statins  PTA Medications: (Not in a hospital admission)   Long Term Goals: Improvement in symptoms so as ready for discharge  Short Term Goals: Patient will verbalize feelings in meetings with treatment team members., Patient will attend at least of 50% of the groups daily., Pt will complete the PHQ9 on admission, day 3 and discharge., Patient will participate in completing the [place] Suicide Severity Rating Scale, Patient will score a low risk of violence for 24 hours prior to discharge, and Patient will take medications as prescribed daily.  Medical Decision Making  61 year old male with history of MDD, benzodiazepine misuse, alcohol use who presented to the [facility] ED on 09/05/2018 for after a fall. He also reported alcohol use and SI. Patient was medically cleared and was recommended for inpatient hospitalization. Patient reported passive SI and desire for substance use treatment on 09/06/21 and was  Patient was accepted to the [facility] on this date. UDS + THC; EtOH 127 on initial presentation.  Today, patient reports desire to detox and to attend residential substance use treatment after completion of detox. He reports desire to taper off of benzodiazepines and to be restarted on home gabapentin and cymbalta. Patient agreeable to start librium taper and CIWA protocol. Will consult with SW re: referral to residential substance use treatment- taper scheduled to end Thursday.   AUD, severe -continue CIWA protocol -restart home gabapentin 300 mg  TID off label for alcohol withdrawal and anxiety -continue librium detox-25 mg QID --> 25 mg TID--> 25 mg BID--> 25 mg daily -multivitamin -thiamine -Alcohol withdrawal PRNs  -zofran 4 mg q 6 hrs nausea  -loperamide 2-4 mg for diarrhea  -hydroxyzine 25 mg q6 hours for anxiety   MDD  R/o SIMD Anxiet -restart home cymbalta 60 mg daily  HTN -patient reports h/o HTN- BP has been well controlled since admission to [facility]. Patient reports that he stopped taking his BP medications due to being hypotensive, will hold for now and monitor BP -  If patient becomes hypertensive, consider to restart zestoretic 10-12.5 mg  Rib pain s/p fall -PRN tylenol 650 mg q6 hours  Dispo: ongoing. SW to assist re: residential [place] placement. Anticipate dc to residential rehab after completion of taper on 6/29    Recommendations  Based on my evaluation the patient does not appear to have an emergency medical condition.  Estella Husk, MD 09/06/21  3:48 PM

## 2021-09-07 DIAGNOSIS — F332 Major depressive disorder, recurrent severe without psychotic features: Secondary | ICD-10-CM | POA: Diagnosis not present

## 2021-09-07 DIAGNOSIS — Z79899 Other long term (current) drug therapy: Secondary | ICD-10-CM | POA: Diagnosis not present

## 2021-09-07 DIAGNOSIS — F1020 Alcohol dependence, uncomplicated: Secondary | ICD-10-CM | POA: Diagnosis not present

## 2021-09-07 DIAGNOSIS — F1514 Other stimulant abuse with stimulant-induced mood disorder: Secondary | ICD-10-CM | POA: Diagnosis not present

## 2021-09-07 NOTE — ED Notes (Signed)
Pt provided lunch.  Currently interacting with others on the unit.  No pain or discomfort noted/ voiced.  Breathing is even and unlabored.

## 2021-09-07 NOTE — ED Notes (Signed)
Pt is in the bed sleeping. Respirations are even and unlabored. No acute distress noted. Will continue to monitor for safety. 

## 2021-09-08 ENCOUNTER — Encounter (HOSPITAL_COMMUNITY): Payer: Self-pay

## 2021-09-08 DIAGNOSIS — F1020 Alcohol dependence, uncomplicated: Secondary | ICD-10-CM | POA: Diagnosis not present

## 2021-09-08 DIAGNOSIS — F332 Major depressive disorder, recurrent severe without psychotic features: Secondary | ICD-10-CM | POA: Diagnosis not present

## 2021-09-08 DIAGNOSIS — Z79899 Other long term (current) drug therapy: Secondary | ICD-10-CM | POA: Diagnosis not present

## 2021-09-08 DIAGNOSIS — F1514 Other stimulant abuse with stimulant-induced mood disorder: Secondary | ICD-10-CM | POA: Diagnosis not present

## 2021-09-08 NOTE — ED Notes (Signed)
Patient is calm and cooperative. Patient had a snack in the day room stated his pain is a 2 after receiving Tylenol. Patient stated that the Vistaril helped him earlier today with his anxiety. Will continue to monitor for safety.

## 2021-09-08 NOTE — BH IP Treatment Plan (Signed)
Interdisciplinary Treatment and Diagnostic Plan Update  09/08/2021 Time of Session: 10:20AM  Zachary [name] MRN: [id number]  Diagnosis:  Final diagnoses:  Substance induced mood disorder (HCC)  Alcohol use disorder, severe, dependence (HCC)  MDD (major depressive disorder), recurrent severe, without psychosis (HCC)  Benzodiazepine abuse (HCC)     Current Medications:  Current Facility-Administered Medications  Medication Dose Route Frequency Provider Last Rate Last Admin   acetaminophen (TYLENOL) tablet 650 mg  650 mg Oral Q6H PRN Estella Husk, MD   650 mg at 09/08/21 1452   alum & mag hydroxide-simeth (MAALOX/MYLANTA) 200-200-20 MG/5ML suspension 30 mL  30 mL Oral Q4H PRN Estella Husk, MD       chlordiazePOXIDE (LIBRIUM) capsule 25 mg  25 mg Oral Q6H PRN Estella Husk, MD       chlordiazePOXIDE (LIBRIUM) capsule 25 mg  25 mg Oral BH-qamhs Estella Husk, MD   25 mg at 09/08/21 5361   Followed by   Melene Muller ON 09/09/2021] chlordiazePOXIDE (LIBRIUM) capsule 25 mg  25 mg Oral Daily Estella Husk, MD       DULoxetine (CYMBALTA) DR capsule 60 mg  60 mg Oral Daily Estella Husk, MD   60 mg at 09/08/21 4431   gabapentin (NEURONTIN) capsule 300 mg  300 mg Oral TID Estella Husk, MD   300 mg at 09/08/21 1511   hydrOXYzine (ATARAX) tablet 10 mg  10 mg Oral TID PRN Estella Husk, MD   10 mg at 09/08/21 5400   hydrOXYzine (ATARAX) tablet 25 mg  25 mg Oral Q6H PRN Estella Husk, MD       loperamide (IMODIUM) capsule 2-4 mg  2-4 mg Oral PRN Estella Husk, MD       magnesium hydroxide (MILK OF MAGNESIA) suspension 30 mL  30 mL Oral Daily PRN Estella Husk, MD       multivitamin with minerals tablet 1 tablet  1 tablet Oral Daily Estella Husk, MD   1 tablet at 09/08/21 0852   ondansetron (ZOFRAN-ODT) disintegrating tablet 4 mg  4 mg Oral Q6H PRN Estella Husk, MD   4 mg at 09/07/21 1750   thiamine tablet 100  mg  100 mg Oral Daily Estella Husk, MD   100 mg at 09/08/21 8676   traZODone (DESYREL) tablet 50 mg  50 mg Oral QHS PRN Estella Husk, MD       Current Outpatient Medications  Medication Sig Dispense Refill   clonazePAM (KLONOPIN) 1 MG tablet Take 1 tablet (1 mg total) by mouth 3 (three) times daily as needed for anxiety. 15 tablet 0   DULoxetine (CYMBALTA) 30 MG capsule Take 30 mg by mouth in the morning and at bedtime.     gabapentin (NEURONTIN) 300 MG capsule Take 1 capsule (300 mg total) by mouth 3 (three) times daily. (Patient not taking: Reported on 09/04/2021) 90 capsule 0   naltrexone (DEPADE) 50 MG tablet Take 50 mg by mouth daily.     pantoprazole (PROTONIX) 40 MG tablet Take 40 mg by mouth daily.     PTA Medications: Prior to Admission medications   Medication Sig Start Date End Date Taking? Authorizing Provider  clonazePAM (KLONOPIN) 1 MG tablet Take 1 tablet (1 mg total) by mouth 3 (three) times daily as needed for anxiety. 08/14/21   Raspet, Noberto Retort, PA-C  DULoxetine (CYMBALTA) 30 MG capsule Take 30 mg by mouth in the morning and at bedtime. 08/17/21  [provider]  gabapentin (NEURONTIN) 300 MG capsule Take 1 capsule (300 mg total) by mouth 3 (three) times daily. Patient not taking: Reported on 09/04/2021 07/24/21 08/23/21  Park Pope, MD  naltrexone (DEPADE) 50 MG tablet Take 50 mg by mouth daily. 08/17/21   [provider]  pantoprazole (PROTONIX) 40 MG tablet Take 40 mg by mouth daily. 08/20/21   [provider]    Patient Stressors: Marital or family conflict   Substance abuse    Patient Strengths: Motivation for treatment/growth   Treatment Modalities: Medication Management, Group therapy, Case management,  1 to 1 session with clinician, Psychoeducation, Recreational therapy.   Physician Treatment Plan for Primary and Secondary Diagnosis:  Final diagnoses:  Substance induced mood disorder (HCC)  Alcohol use disorder, severe,  dependence (HCC)  MDD (major depressive disorder), recurrent severe, without psychosis (HCC)  Benzodiazepine abuse (HCC)   Long Term Goal(s): Improvement in symptoms so as ready for discharge  Short Term Goals: Patient will verbalize feelings in meetings with treatment team members. Patient will attend at least of 50% of the groups daily. Pt will complete the PHQ9 on admission, day 3 and discharge. Patient will participate in completing the [place] Suicide Severity Rating Scale Patient will score a low risk of violence for 24 hours prior to discharge Patient will take medications as prescribed daily.  Medication Management: Evaluate patient's response, side effects, and tolerance of medication regimen.  Therapeutic Interventions: 1 to 1 sessions, Unit Group sessions and Medication administration.  Evaluation of Outcomes: Progressing  LCSW Treatment Plan for Primary Diagnosis:  Final diagnoses:  Substance induced mood disorder (HCC)  Alcohol use disorder, severe, dependence (HCC)  MDD (major depressive disorder), recurrent severe, without psychosis (HCC)  Benzodiazepine abuse (HCC)    Long Term Goal(s): Safe transition to appropriate next level of care at discharge.  Short Term Goals: Facilitate acceptance of mental health diagnosis and concerns through verbal commitment to aftercare plan and appointments at discharge. and Identify minimum of 2 triggers associated with mental health/substance abuse issues with treatment team members.  Therapeutic Interventions: Assess for all discharge needs, 1 to 1 time with Child psychotherapist, Explore available resources and support systems, Assess for adequacy in community support network, Educate family and significant other(s) on suicide prevention, Complete Psychosocial Assessment, Interpersonal group therapy.  Evaluation of Outcomes: Progressing   Progress in Treatment: Attending groups: No. Participating in groups: No. Taking medication as  prescribed: Yes. Toleration medication: Yes. Family/Significant other contact made: No, will contact:  no one at this time Patient understands diagnosis: Yes. Discussing patient identified problems/goals with staff: Yes. Medical problems stabilized or resolved: Yes. Denies suicidal/homicidal ideation: Yes. Issues/concerns per patient self-inventory: No. Other: None   New problem(s) identified: None   New Short Term/Long Term Goal(s): Chalmer wants to participate in residential treatment services; Long-term residential treatment services  Patient Goals:  "To get into treatment"   Discharge Plan or Barriers: Delma plans to discharge to a residential treatment program for continuity of care.   Reason for Continuation of Hospitalization: Anxiety Medication stabilization  Estimated Length of Stay: Thursday, 09/09/21  Last 3 [place] Suicide Severity Risk Score: Flowsheet Row ED from 09/06/2021 in [facility] ED from 09/04/2021 in [facility] from 09/01/2021 in [facility] EMERGENCY DEPARTMENT  C-SSRS RISK CATEGORY High Risk High Risk High Risk       Last [facility] 2/9 Scores:    09/06/2021    2:23 PM  Depression screen PHQ 2/9  Decreased Interest 3  Down, Depressed, Hopeless 3  PHQ - 2 Score 6  Altered sleeping 3  Tired, decreased energy 3  Change in appetite 3  Feeling bad or failure about yourself  3  Trouble concentrating 3  Moving slowly or fidgety/restless 3  Suicidal thoughts 3  PHQ-9 Score 27  Difficult doing work/chores Extremely dIfficult    Scribe for Treatment Team: Karren Burly 09/08/2021 3:46 PM

## 2021-09-08 NOTE — Progress Notes (Signed)
Patient remains with constricted affect and sad mood.  Patient appears demoralized and is negativistic when Clinical research associate attempts to explore coping skills with him.  Patient is pleasant and cooperative however.  No evidence of withdrawal symptoms at this time although he does endorse ongoing anxiety.  Patient denies avh shi or plan.  He was disappointed that [facility] however accepted support around possibility of finding another rehab facility.  He is currently resting in bed after eating lunch in the day room.  Will monitor and provide a safe environment.

## 2021-09-08 NOTE — ED Notes (Signed)
Pt is in the bed sleeping. Respirations are even and unlabored. No acute distress noted. Will continue to monitor for safety. 

## 2021-09-08 NOTE — ED Provider Notes (Signed)
Behavioral Health Progress Note  Date and Time: 09/08/2021 1:21 PM Name: Zachary [name] MRN:  [id number]  Subjective:   61 year old male with history of MDD, benzodiazepine misuse, alcohol use who presented to the [facility] ED on 09/04/2021 for after a fall. He also reported alcohol use and SI. Patient was medically cleared and was recommended for inpatient hospitalization. Patient reported passive SI and desire for substance use treatment on 09/06/21 and was  Patient was accepted to the [facility] on this date. UDS + THC; EtOH 127 on initial presentation.  Patient seen and chart reviewed-  most recent CIWA 2. VSS. Patient interviewed in his room this morning-he is found lying in bed in no acute distress.  Patient describes his mood is "alright".  He denies nausea, vomiting, tremors, headache, diarrhea, GI upset.  Patient reports he has pain "all over"; however, states that overall the pain in his ribs from a fall he sustained prior to hospitalization has improved.  No SI/HI/AVH.  Patient was informed by LCSW that due to his insurance that he was declined from [facility].  Patient verbalized understanding and expressed continued desire for residential rehab.  LCSW informed patient that multiple facilities having contact do not have beds but that ongoing attempts will be made to contact facilities.  Patient verbalized understanding.  Patient declines sober living and reports that he is not interested in faith-based treatment programs.  Patient is informed that there will be ongoing attempts to contact residential rehab facilities but that there is a chance that he will not be able to go directly from the [facility] to a facility based on availability.  Patient verbalized understanding and expressed frustration. Patient is informed that his taper ends tomorrow and that the plan will be to discharge tomorrow after taper. Patient verbalized understanding. Patient was given the opportunity to ask questions and  All questions answered.  Patient verbalized understanding regarding plan of care.      Diagnosis:  Final diagnoses:  Substance induced mood disorder (HCC)  Alcohol use disorder, severe, dependence (HCC)  MDD (major depressive disorder), recurrent severe, without psychosis ([place])  Benzodiazepine abuse ([place])    Total Time spent with patient: 20 minutes  Past Psychiatric History:  DD, benzodiazepine misuse, alcohol use Past Medical History:  Past Medical History:  Diagnosis Date   Alcohol abuse    COPD (chronic obstructive pulmonary disease) ([place])    Depression    Hypertension     Past Surgical History:  Procedure Laterality Date   COLONOSCOPY     MOUTH SURGERY     NO PAST SURGERIES     Family History:  Family History  Problem Relation Age of Onset   Heart attack Paternal Grandfather    Stroke Mother    Heart failure Mother    Heart failure Father    Family Psychiatric  History:  Denies psychiatric history although states that his family is "secretive"  Reports that his father struggled with alcohol use Reports that his mother wrote a suicide note when he was a child but to his knowledge has not attempted; and unknown if she received psychiatric care  Social History:  Social History   Substance and Sexual Activity  Alcohol Use Yes   Comment: BAC was .23 on admission     Social History   Substance and Sexual Activity  Drug Use Yes   Types: Marijuana   Comment: UDS was negative    Social History   Socioeconomic History   Marital status: Divorced  Spouse name: Not on file   Number of children: 2   Years of education: Not on file   Highest education level: Not on file  Occupational History   Occupation: Clinical biochemist  Tobacco Use   Smoking status: Some Days    Packs/day: 2.00    Types: Cigarettes   Smokeless tobacco: Never  Vaping Use   Vaping Use: Not on file  Substance and Sexual Activity   Alcohol use: Yes    Comment: BAC was .23 on admission   Drug use: Yes     Types: Marijuana    Comment: UDS was negative   Sexual activity: Not Currently  Other Topics Concern   Not on file  Social History Narrative   Pt stated that he lives alone; works as a Clinical biochemist   Social Determinants of Radio broadcast assistant Strain: Not on file  Food Insecurity: Not on file  Transportation Needs: Not on file  Physical Activity: Not on file  Stress: Not on file  Social Connections: Not on file   SDOH:  SDOH Screenings   Alcohol Screen: Medium Risk (07/22/2021)   Alcohol Screen    Last Alcohol Screening Score (AUDIT): 16  Depression (PHQ2-9): Medium Risk (09/06/2021)   Depression (PHQ2-9)    PHQ-2 Score: 27  Financial Resource Strain: Not on file  Food Insecurity: Not on file  Housing: Not on file  Physical Activity: Not on file  Social Connections: Not on file  Stress: Not on file  Tobacco Use: High Risk (09/04/2021)   Patient History    Smoking Tobacco Use: Some Days    Smokeless Tobacco Use: Never    Passive Exposure: Not on file  Transportation Needs: Not on file   Additional Social History:                         Sleep: Good  Appetite:  Fair  Current Medications:  Current Facility-Administered Medications  Medication Dose Route Frequency Provider Last Rate Last Admin   acetaminophen (TYLENOL) tablet 650 mg  650 mg Oral Q6H PRN Ival Bible, MD   650 mg at 09/08/21 0852   alum & mag hydroxide-simeth (MAALOX/MYLANTA) 200-200-20 MG/5ML suspension 30 mL  30 mL Oral Q4H PRN Ival Bible, MD       chlordiazePOXIDE (LIBRIUM) capsule 25 mg  25 mg Oral Q6H PRN Ival Bible, MD       chlordiazePOXIDE (LIBRIUM) capsule 25 mg  25 mg Oral BH-qamhs Ival Bible, MD   25 mg at 09/08/21 [id number]   Followed by   Derrill Memo ON 09/09/2021] chlordiazePOXIDE (LIBRIUM) capsule 25 mg  25 mg Oral Daily Ival Bible, MD       DULoxetine (CYMBALTA) DR capsule 60 mg  60 mg Oral Daily Ival Bible, MD   60 mg  at 09/08/21 [id number]   gabapentin (NEURONTIN) capsule 300 mg  300 mg Oral TID Ival Bible, MD   300 mg at 09/08/21 [id number]   hydrOXYzine (ATARAX) tablet 10 mg  10 mg Oral TID PRN Ival Bible, MD   10 mg at 09/08/21 [id number]   hydrOXYzine (ATARAX) tablet 25 mg  25 mg Oral Q6H PRN Ival Bible, MD       loperamide (IMODIUM) capsule 2-4 mg  2-4 mg Oral PRN Ival Bible, MD       magnesium hydroxide (MILK OF MAGNESIA) suspension 30 mL  30 mL Oral Daily PRN Carlei Huang,  Philmore Pali, MD       multivitamin with minerals tablet 1 tablet  1 tablet Oral Daily Estella Husk, MD   1 tablet at 09/08/21 0852   ondansetron (ZOFRAN-ODT) disintegrating tablet 4 mg  4 mg Oral Q6H PRN Estella Husk, MD   4 mg at 09/07/21 1750   thiamine tablet 100 mg  100 mg Oral Daily Estella Husk, MD   100 mg at 09/08/21 8676   traZODone (DESYREL) tablet 50 mg  50 mg Oral QHS PRN Estella Husk, MD       Current Outpatient Medications  Medication Sig Dispense Refill   clonazePAM (KLONOPIN) 1 MG tablet Take 1 tablet (1 mg total) by mouth 3 (three) times daily as needed for anxiety. 15 tablet 0   DULoxetine (CYMBALTA) 30 MG capsule Take 30 mg by mouth in the morning and at bedtime.     gabapentin (NEURONTIN) 300 MG capsule Take 1 capsule (300 mg total) by mouth 3 (three) times daily. (Patient not taking: Reported on 09/04/2021) 90 capsule 0   naltrexone (DEPADE) 50 MG tablet Take 50 mg by mouth daily.     pantoprazole (PROTONIX) 40 MG tablet Take 40 mg by mouth daily.      Labs  Lab Results:  Admission on 09/04/2021, Discharged on 09/06/2021  Component Date Value Ref Range Status   Glucose-Capillary 09/04/2021 99  70 - 99 mg/dL Final   Glucose reference range applies only to samples taken after fasting for at least 8 hours.   Sodium 09/04/2021 139  135 - 145 mmol/L Final   Potassium 09/04/2021 3.9  3.5 - 5.1 mmol/L Final   Chloride 09/04/2021 104  98 - 111 mmol/L Final   CO2  09/04/2021 24  22 - 32 mmol/L Final   Glucose, Bld 09/04/2021 86  70 - 99 mg/dL Final   Glucose reference range applies only to samples taken after fasting for at least 8 hours.   BUN 09/04/2021 9  8 - 23 mg/dL Final   Creatinine, Ser 09/04/2021 0.73  0.61 - 1.24 mg/dL Final   Calcium 19/50/9326 9.3  8.9 - 10.3 mg/dL Final   Total Protein 71/24/5809 7.2  6.5 - 8.1 g/dL Final   Albumin 98/33/8250 4.5  3.5 - 5.0 g/dL Final   AST 53/97/6734 19  15 - 41 U/L Final   ALT 09/04/2021 19  0 - 44 U/L Final   Alkaline Phosphatase 09/04/2021 90  38 - 126 U/L Final   Total Bilirubin 09/04/2021 0.7  0.3 - 1.2 mg/dL Final   GFR, Estimated 09/04/2021 >60  >60 mL/min Final   Comment: (NOTE) Calculated using the CKD-EPI Creatinine Equation (2021)    Anion gap 09/04/2021 11  5 - 15 Final   Performed at [facility], [address], [place], [place] [address]   Salicylate Lvl 09/04/2021 <7.0 (L)  7.0 - 30.0 mg/dL Final   Performed at [facility], [address] [place], [place] [address]   Acetaminophen (Tylenol), Serum 09/04/2021 <10 (L)  10 - 30 ug/mL Final   Comment: (NOTE) Therapeutic concentrations vary significantly. A range of 10-30 ug/mL  may be an effective concentration for many patients. However, some  are best treated at concentrations outside of this range. Acetaminophen concentrations >150 ug/mL at 4 hours after ingestion  and >50 ug/mL at 12 hours after ingestion are often associated with  toxic reactions.  Performed at [facility], [address] [place],  [place] [address]    Alcohol, Ethyl (B) 09/04/2021 127 (H)  <10 mg/dL Final   Comment: (NOTE) Lowest detectable limit for serum alcohol is 10 mg/dL.  For medical purposes only. Performed at [facility], [address] [place], [place] [address]    Opiates 09/04/2021 NONE DETECTED  NONE DETECTED Final   Cocaine 09/04/2021 NONE DETECTED  NONE  DETECTED Final   Benzodiazepines 09/04/2021 NONE DETECTED  NONE DETECTED Final   Amphetamines 09/04/2021 NONE DETECTED  NONE DETECTED Final   Tetrahydrocannabinol 09/04/2021 POSITIVE (A)  NONE DETECTED Final   Barbiturates 09/04/2021 NONE DETECTED  NONE DETECTED Final   Comment: (NOTE) DRUG SCREEN FOR MEDICAL PURPOSES ONLY.  IF CONFIRMATION IS NEEDED FOR ANY PURPOSE, NOTIFY LAB WITHIN 5 DAYS.  LOWEST DETECTABLE LIMITS FOR URINE DRUG SCREEN Drug Class                     Cutoff (ng/mL) Amphetamine and metabolites    1000 Barbiturate and metabolites    200 Benzodiazepine                 200 Tricyclics and metabolites     300 Opiates and metabolites        300 Cocaine and metabolites        300 THC                            50 Performed at [facility], [address], [place], [place] [address]    WBC 09/04/2021 9.6  4.0 - 10.5 K/uL Final   RBC 09/04/2021 5.12  4.22 - 5.81 MIL/uL Final   Hemoglobin 09/04/2021 17.0  13.0 - 17.0 g/dL Final   HCT 32/67/1245 46.8  39.0 - 52.0 % Final   MCV 09/04/2021 91.4  80.0 - 100.0 fL Final   MCH 09/04/2021 33.2  26.0 - 34.0 pg Final   MCHC 09/04/2021 36.3 (H)  30.0 - 36.0 g/dL Final   RDW 80/99/8338 12.4  11.5 - 15.5 % Final   Platelets 09/04/2021 171  150 - 400 K/uL Final   nRBC 09/04/2021 0.0  0.0 - 0.2 % Final   Neutrophils Relative % 09/04/2021 63  % Final   Neutro Abs 09/04/2021 6.1  1.7 - 7.7 K/uL Final   Lymphocytes Relative 09/04/2021 23  % Final   Lymphs Abs 09/04/2021 2.2  0.7 - 4.0 K/uL Final   Monocytes Relative 09/04/2021 9  % Final   Monocytes Absolute 09/04/2021 0.9  0.1 - 1.0 K/uL Final   Eosinophils Relative 09/04/2021 2  % Final   Eosinophils Absolute 09/04/2021 0.2  0.0 - 0.5 K/uL Final   Basophils Relative 09/04/2021 1  % Final   Basophils Absolute 09/04/2021 0.1  0.0 - 0.1 K/uL Final   Immature Granulocytes 09/04/2021 2  % Final   Abs Immature Granulocytes 09/04/2021 0.15 (H)  0.00 - 0.07 K/uL Final    Performed at [facility], [address], [place], [place] [address]   SARS Coronavirus 2 by RT PCR 09/04/2021 NEGATIVE  NEGATIVE Final   Comment: (NOTE) SARS-CoV-2 target nucleic acids are NOT DETECTED.  The SARS-CoV-2 RNA is generally detectable in upper respiratory specimens during the acute phase of infection. The lowest concentration of SARS-CoV-2 viral copies this assay can detect is 138 copies/mL. A negative result does not preclude SARS-Cov-2 infection and should not be used as the sole basis for treatment or other  patient management decisions. A negative result may occur with  improper specimen collection/handling, submission of specimen other than nasopharyngeal swab, presence of viral mutation(s) within the areas targeted by this assay, and inadequate number of viral copies(<138 copies/mL). A negative result must be combined with clinical observations, patient history, and epidemiological information. The expected result is Negative.  Fact Sheet for Patients:  BloggerCourse.com  Fact Sheet for Healthcare Providers:  SeriousBroker.it  This test is no                          t yet approved or cleared by the [place] FDA and  has been authorized for detection and/or diagnosis of SARS-CoV-2 by FDA under an Emergency Use Authorization (EUA). This EUA will remain  in effect (meaning this test can be used) for the duration of the COVID-19 declaration under Section 564(b)(1) of the Act, 21 U.S.C.section 360bbb-3(b)(1), unless the authorization is terminated  or revoked sooner.       Influenza A by PCR 09/04/2021 NEGATIVE  NEGATIVE Final   Influenza B by PCR 09/04/2021 NEGATIVE  NEGATIVE Final   Comment: (NOTE) The Xpert Xpress SARS-CoV-2/FLU/RSV plus assay is intended as an aid in the diagnosis of influenza from Nasopharyngeal swab specimens and should not be used as a sole basis for treatment.  Nasal washings and aspirates are unacceptable for Xpert Xpress SARS-CoV-2/FLU/RSV testing.  Fact Sheet for Patients: BloggerCourse.com  Fact Sheet for Healthcare Providers: SeriousBroker.it  This test is not yet approved or cleared by the [place] FDA and has been authorized for detection and/or diagnosis of SARS-CoV-2 by FDA under an Emergency Use Authorization (EUA). This EUA will remain in effect (meaning this test can be used) for the duration of the COVID-19 declaration under Section 564(b)(1) of the Act, 21 U.S.C. section 360bbb-3(b)(1), unless the authorization is terminated or revoked.  Performed at [facility], [address], [place], [place] [address]   Admission on 09/01/2021, Discharged on 09/02/2021  Component Date Value Ref Range Status   SARS Coronavirus 2 by RT PCR 09/01/2021 NEGATIVE  NEGATIVE Final   Comment: (NOTE) SARS-CoV-2 target nucleic acids are NOT DETECTED.  The SARS-CoV-2 RNA is generally detectable in upper respiratory specimens during the acute phase of infection. The lowest concentration of SARS-CoV-2 viral copies this assay can detect is 138 copies/mL. A negative result does not preclude SARS-Cov-2 infection and should not be used as the sole basis for treatment or other patient management decisions. A negative result may occur with  improper specimen collection/handling, submission of specimen other than nasopharyngeal swab, presence of viral mutation(s) within the areas targeted by this assay, and inadequate number of viral copies(<138 copies/mL). A negative result must be combined with clinical observations, patient history, and epidemiological information. The expected result is Negative.  Fact Sheet for Patients:  BloggerCourse.com  Fact Sheet for Healthcare Providers:  SeriousBroker.it  This test is no                           t yet approved or cleared by the [place] FDA and  has been authorized for detection and/or diagnosis of SARS-CoV-2 by FDA under an Emergency Use Authorization (EUA). This EUA will remain  in effect (meaning this test can be used) for the duration of the COVID-19 declaration under Section 564(b)(1) of the Act, 21 U.S.C.section 360bbb-3(b)(1), unless the authorization is terminated  or revoked sooner.  Influenza A by PCR 09/01/2021 NEGATIVE  NEGATIVE Final   Influenza B by PCR 09/01/2021 NEGATIVE  NEGATIVE Final   Comment: (NOTE) The Xpert Xpress SARS-CoV-2/FLU/RSV plus assay is intended as an aid in the diagnosis of influenza from Nasopharyngeal swab specimens and should not be used as a sole basis for treatment. Nasal washings and aspirates are unacceptable for Xpert Xpress SARS-CoV-2/FLU/RSV testing.  Fact Sheet for Patients: EntrepreneurPulse.com.au  Fact Sheet for Healthcare Providers: IncredibleEmployment.be  This test is not yet approved or cleared by the [place] FDA and has been authorized for detection and/or diagnosis of SARS-CoV-2 by FDA under an Emergency Use Authorization (EUA). This EUA will remain in effect (meaning this test can be used) for the duration of the COVID-19 declaration under Section 564(b)(1) of the Act, 21 U.S.C. section 360bbb-3(b)(1), unless the authorization is terminated or revoked.  Performed at [facility], [place] [address], [place], [place] [address]    Sodium 09/01/2021 139  135 - 145 mmol/L Final   Potassium 09/01/2021 4.0  3.5 - 5.1 mmol/L Final   Chloride 09/01/2021 107  98 - 111 mmol/L Final   CO2 09/01/2021 21 (L)  22 - 32 mmol/L Final   Glucose, Bld 09/01/2021 80  70 - 99 mg/dL Final   Glucose reference range applies only to samples taken after fasting for at least 8 hours.   BUN 09/01/2021 5 (L)  8 - 23 mg/dL Final   Creatinine, Ser 09/01/2021 0.82  0.61 - 1.24  mg/dL Final   Calcium 09/01/2021 9.4  8.9 - 10.3 mg/dL Final   Total Protein 09/01/2021 7.1  6.5 - 8.1 g/dL Final   Albumin 09/01/2021 4.3  3.5 - 5.0 g/dL Final   AST 09/01/2021 17  15 - 41 U/L Final   ALT 09/01/2021 17  0 - 44 U/L Final   Alkaline Phosphatase 09/01/2021 86  38 - 126 U/L Final   Total Bilirubin 09/01/2021 0.5  0.3 - 1.2 mg/dL Final   GFR, Estimated 09/01/2021 >60  >60 mL/min Final   Comment: (NOTE) Calculated using the CKD-EPI Creatinine Equation (2021)    Anion gap 09/01/2021 11  5 - 15 Final   Performed at [facility] [address] [place], [place] [address]   Alcohol, Ethyl (B) 09/01/2021 154 (H)  <10 mg/dL Final   Comment: (NOTE) Lowest detectable limit for serum alcohol is 10 mg/dL.  For medical purposes only. Performed at [facility], [place] [address], [place], [place] [address]    Opiates 09/01/2021 NONE DETECTED  NONE DETECTED Final   Cocaine 09/01/2021 NONE DETECTED  NONE DETECTED Final   Benzodiazepines 09/01/2021 POSITIVE (A)  NONE DETECTED Final   Amphetamines 09/01/2021 NONE DETECTED  NONE DETECTED Final   Tetrahydrocannabinol 09/01/2021 POSITIVE (A)  NONE DETECTED Final   Barbiturates 09/01/2021 NONE DETECTED  NONE DETECTED Final   Comment: (NOTE) DRUG SCREEN FOR MEDICAL PURPOSES ONLY.  IF CONFIRMATION IS NEEDED FOR ANY PURPOSE, NOTIFY LAB WITHIN 5 DAYS.  LOWEST DETECTABLE LIMITS FOR URINE DRUG SCREEN Drug Class                     Cutoff (ng/mL) Amphetamine and metabolites    1000 Barbiturate and metabolites    200 Benzodiazepine                 [id number] Tricyclics and metabolites     300 Opiates and metabolites        300 Cocaine  and metabolites        300 THC                            50 Performed at [facility], [address] [place], [place] [address]    WBC 09/01/2021 7.5  4.0 - 10.5 K/uL Final   RBC 09/01/2021 5.08  4.22 - 5.81 MIL/uL Final   Hemoglobin 09/01/2021 16.7  13.0 - 17.0 g/dL Final   HCT 67/02/4579  46.6  39.0 - 52.0 % Final   MCV 09/01/2021 91.7  80.0 - 100.0 fL Final   MCH 09/01/2021 32.9  26.0 - 34.0 pg Final   MCHC 09/01/2021 35.8  30.0 - 36.0 g/dL Final   RDW 99/83/3825 12.3  11.5 - 15.5 % Final   Platelets 09/01/2021 184  150 - 400 K/uL Final   nRBC 09/01/2021 0.0  0.0 - 0.2 % Final   Neutrophils Relative % 09/01/2021 53  % Final   Neutro Abs 09/01/2021 4.1  1.7 - 7.7 K/uL Final   Lymphocytes Relative 09/01/2021 33  % Final   Lymphs Abs 09/01/2021 2.4  0.7 - 4.0 K/uL Final   Monocytes Relative 09/01/2021 9  % Final   Monocytes Absolute 09/01/2021 0.6  0.1 - 1.0 K/uL Final   Eosinophils Relative 09/01/2021 3  % Final   Eosinophils Absolute 09/01/2021 0.2  0.0 - 0.5 K/uL Final   Basophils Relative 09/01/2021 1  % Final   Basophils Absolute 09/01/2021 0.1  0.0 - 0.1 K/uL Final   Immature Granulocytes 09/01/2021 1  % Final   Abs Immature Granulocytes 09/01/2021 0.08 (H)  0.00 - 0.07 K/uL Final   Performed at [facility], [address], [place], [place] [address]   Salicylate Lvl 09/01/2021 <7.0 (L)  7.0 - 30.0 mg/dL Final   Performed at [facility], [address], [place], [place] [address]   Acetaminophen (Tylenol), Serum 09/01/2021 <10 (L)  10 - 30 ug/mL Final   Comment: (NOTE) Therapeutic concentrations vary significantly. A range of 10-30 ug/mL  may be an effective concentration for many patients. However, some  are best treated at concentrations outside of this range. Acetaminophen concentrations >150 ug/mL at 4 hours after ingestion  and >50 ug/mL at 12 hours after ingestion are often associated with  toxic reactions.  Performed at [facility], [address], [place], [place] [address]   Admission on 07/22/2021, Discharged on 07/24/2021  Component Date Value Ref Range Status   Hgb A1c MFr Bld 07/22/2021 4.9  4.8 - 5.6 % Final   Comment: (NOTE) Pre diabetes:          5.7%-6.4%  Diabetes:              >6.4%  Glycemic control for    <7.0% adults with diabetes    Mean Plasma Glucose 07/22/2021 93.93  mg/dL Final   Performed at [facility], [address], [place], [place] [address]   Cholesterol 07/22/2021 224 (H)  0 - 200 mg/dL Final   Triglycerides 35/32/9924 173 (H)  <150 mg/dL Final   HDL 26/83/4196 95  >40 mg/dL Final   Total CHOL/HDL Ratio 07/22/2021 2.4  RATIO Final   VLDL 07/22/2021 35  0 - 40 mg/dL Final   LDL Cholesterol 07/22/2021 94  0 - 99 mg/dL Final   Comment:        Total Cholesterol/HDL:CHD Risk Coronary Heart  Disease Risk Table                     Men   Women  1/2 Average Risk   3.4   3.3  Average Risk       5.0   4.4  2 X Average Risk   9.6   7.1  3 X Average Risk  23.4   11.0        Use the calculated Patient Ratio above and the CHD Risk Table to determine the patient's CHD Risk.        ATP III CLASSIFICATION (LDL):  <100     mg/dL   Optimal  100-129  mg/dL   Near or Above                    Optimal  130-159  mg/dL   Borderline  160-189  mg/dL   High  >190     mg/dL   Very High Performed at [facility] [address], [place], [place] [address]    TSH 07/22/2021 2.302  0.350 - 4.500 uIU/mL Final   Comment: Performed by a 3rd Generation assay with a functional sensitivity of <=0.01 uIU/mL. Performed at [facility], [place] [address], [place],  [address]   Admission on 07/19/2021, Discharged on 07/22/2021  Component Date Value Ref Range Status   SARS Coronavirus 2 by RT PCR 07/19/2021 NEGATIVE  NEGATIVE Final   Comment: (NOTE) SARS-CoV-2 target nucleic acids are NOT DETECTED.  The SARS-CoV-2 RNA is generally detectable in upper respiratory specimens during the acute phase of infection. The lowest concentration of SARS-CoV-2 viral copies this assay can detect is 138 copies/mL. A negative result does not preclude SARS-Cov-2 infection and should not be used as the sole basis for treatment or other patient management decisions. A  negative result may occur with  improper specimen collection/handling, submission of specimen other than nasopharyngeal swab, presence of viral mutation(s) within the areas targeted by this assay, and inadequate number of viral copies(<138 copies/mL). A negative result must be combined with clinical observations, patient history, and epidemiological information. The expected result is Negative.  Fact Sheet for Patients:  EntrepreneurPulse.com.au  Fact Sheet for Healthcare Providers:  IncredibleEmployment.be  This test is no                          t yet approved or cleared by the [place] FDA and  has been authorized for detection and/or diagnosis of SARS-CoV-2 by FDA under an Emergency Use Authorization (EUA). This EUA will remain  in effect (meaning this test can be used) for the duration of the COVID-19 declaration under Section 564(b)(1) of the Act, 21 U.S.C.section 360bbb-3(b)(1), unless the authorization is terminated  or revoked sooner.       Influenza A by PCR 07/19/2021 NEGATIVE  NEGATIVE Final   Influenza B by PCR 07/19/2021 NEGATIVE  NEGATIVE Final   Comment: (NOTE) The Xpert Xpress SARS-CoV-2/FLU/RSV plus assay is intended as an aid in the diagnosis of influenza from Nasopharyngeal swab specimens and should not be used as a sole basis for treatment. Nasal washings and aspirates are unacceptable for Xpert Xpress SARS-CoV-2/FLU/RSV testing.  Fact Sheet for Patients: EntrepreneurPulse.com.au  Fact Sheet for Healthcare Providers: IncredibleEmployment.be  This test is not yet approved or cleared by the [place] FDA and has been authorized for detection and/or diagnosis of SARS-CoV-2 by FDA under an Emergency Use  Authorization (EUA). This EUA will remain in effect (meaning this test can be used) for the duration of the COVID-19 declaration under Section 564(b)(1) of the Act, 21  U.S.C. section 360bbb-3(b)(1), unless the authorization is terminated or revoked.  Performed at [facility], [place] [address] [place], [place] [address]    Sodium 07/19/2021 139  135 - 145 mmol/L Final   Potassium 07/19/2021 3.9  3.5 - 5.1 mmol/L Final   Chloride 07/19/2021 104  98 - 111 mmol/L Final   CO2 07/19/2021 26  22 - 32 mmol/L Final   Glucose, Bld 07/19/2021 86  70 - 99 mg/dL Final   Glucose reference range applies only to samples taken after fasting for at least 8 hours.   BUN 07/19/2021 7 (L)  8 - 23 mg/dL Final   Creatinine, Ser 07/19/2021 0.84  0.61 - 1.24 mg/dL Final   Calcium 07/19/2021 9.1  8.9 - 10.3 mg/dL Final   Total Protein 07/19/2021 6.1 (L)  6.5 - 8.1 g/dL Final   Albumin 07/19/2021 3.7  3.5 - 5.0 g/dL Final   AST 07/19/2021 38  15 - 41 U/L Final   ALT 07/19/2021 28  0 - 44 U/L Final   Alkaline Phosphatase 07/19/2021 80  38 - 126 U/L Final   Total Bilirubin 07/19/2021 1.1  0.3 - 1.2 mg/dL Final   GFR, Estimated 07/19/2021 >60  >60 mL/min Final   Comment: (NOTE) Calculated using the CKD-EPI Creatinine Equation (2021)    Anion gap 07/19/2021 9  5 - 15 Final   Performed at [facility] [address], [place], [place] [address]   Alcohol, Ethyl (B) 07/19/2021 33 (H)  <10 mg/dL Final   Comment: (NOTE) Lowest detectable limit for serum alcohol is 10 mg/dL.  For medical purposes only. Performed at [facility], [place] [address], [place], [place] [address]    Opiates 07/19/2021 NONE DETECTED  NONE DETECTED Final   Cocaine 07/19/2021 NONE DETECTED  NONE DETECTED Final   Benzodiazepines 07/19/2021 POSITIVE (A)  NONE DETECTED Final   Amphetamines 07/19/2021 NONE DETECTED  NONE DETECTED Final   Tetrahydrocannabinol 07/19/2021 NONE DETECTED  NONE DETECTED Final   Barbiturates 07/19/2021 NONE DETECTED  NONE DETECTED Final   Comment: (NOTE) DRUG SCREEN FOR MEDICAL PURPOSES ONLY.  IF CONFIRMATION IS NEEDED FOR ANY PURPOSE, NOTIFY LAB WITHIN 5  DAYS.  LOWEST DETECTABLE LIMITS FOR URINE DRUG SCREEN Drug Class                     Cutoff (ng/mL) Amphetamine and metabolites    1000 Barbiturate and metabolites    200 Benzodiazepine                 [id number] Tricyclics and metabolites     300 Opiates and metabolites        300 Cocaine and metabolites        300 THC                            50 Performed at [facility], [place] [address], [place], [place] [address]    WBC 07/19/2021 8.8  4.0 - 10.5 K/uL Final   RBC 07/19/2021 4.16 (L)  4.22 - 5.81 MIL/uL Final   Hemoglobin 07/19/2021 13.7  13.0 - 17.0 g/dL Final   HCT 07/19/2021 38.2 (L)  39.0 - 52.0 % Final   MCV 07/19/2021 91.8  80.0 - 100.0  fL Final   MCH 07/19/2021 32.9  26.0 - 34.0 pg Final   MCHC 07/19/2021 35.9  30.0 - 36.0 g/dL Final   RDW 07/19/2021 12.3  11.5 - 15.5 % Final   Platelets 07/19/2021 164  150 - 400 K/uL Final   nRBC 07/19/2021 0.0  0.0 - 0.2 % Final   Neutrophils Relative % 07/19/2021 58  % Final   Neutro Abs 07/19/2021 5.2  1.7 - 7.7 K/uL Final   Lymphocytes Relative 07/19/2021 23  % Final   Lymphs Abs 07/19/2021 2.0  0.7 - 4.0 K/uL Final   Monocytes Relative 07/19/2021 15  % Final   Monocytes Absolute 07/19/2021 1.3 (H)  0.1 - 1.0 K/uL Final   Eosinophils Relative 07/19/2021 1  % Final   Eosinophils Absolute 07/19/2021 0.1  0.0 - 0.5 K/uL Final   Basophils Relative 07/19/2021 1  % Final   Basophils Absolute 07/19/2021 0.1  0.0 - 0.1 K/uL Final   Immature Granulocytes 07/19/2021 2  % Final   Abs Immature Granulocytes 07/19/2021 0.14 (H)  0.00 - 0.07 K/uL Final   Performed at [facility], [place] [address], [place], [place] [address]    Blood Alcohol level:  Lab Results  Component Value Date   ETH 127 (H) 09/04/2021   ETH 154 (H) [id number]    Metabolic Disorder Labs: Lab Results  Component Value Date   HGBA1C 4.9 07/22/2021   MPG 93.93 07/22/2021   MPG 105 10/28/2015   No results found for: "PROLACTIN" Lab Results  Component Value  Date   CHOL 224 (H) 07/22/2021   TRIG 173 (H) 07/22/2021   HDL 95 07/22/2021   CHOLHDL 2.4 07/22/2021   VLDL 35 07/22/2021   LDLCALC 94 07/22/2021   LDLCALC 184 (H) 10/29/2015    Therapeutic Lab Levels: No results found for: "LITHIUM" No results found for: "VALPROATE" No results found for: "CBMZ"  Physical Findings   AIMS    Flowsheet Row Admission (Discharged) from 12/30/2016 in [facility] 300B  AIMS Total Score 0      AUDIT    Flowsheet Row Admission (Discharged) from 07/22/2021 in [facility] [facility]B Admission (Discharged) from 07/08/2018 in [facility] 300B Admission (Discharged) from 12/30/2016 in [facility] 300B  Alcohol Use Disorder Identification Test Final Score (AUDIT) 16 17 31       CAGE-AID    Flowsheet Row ED to Hosp-Admission (Discharged) from 12/08/2019 in [facility] Score 2      PHQ2-9    [facility] from 09/06/2021 in [facility]  PHQ-2 Total Score 6  PHQ-9 Total Score 27      [facility] from 09/06/2021 in [facility] ED from 09/04/2021 in [facility] DEPT ED from 09/01/2021 in [facility] CATEGORY High Risk High Risk High Risk        Musculoskeletal  Strength & Muscle Tone: within normal limits Gait & Station: normal Patient leans: N/A  Psychiatric Specialty Exam  Presentation  General Appearance: Appropriate for Environment; Casual  Eye Contact:Fair  Speech:Clear and Coherent; Normal Rate  Speech Volume:Normal  Handedness:Right   Mood and Affect  Mood:-- ("alright")  Affect:Appropriate (irritable)   Thought Process  Thought Processes:Coherent; Linear  Descriptions of Associations:Intact  Orientation:Full (Time, Place and  Person)  Thought Content:WDL; Logical  Diagnosis of Schizophrenia or Schizoaffective disorder in past: No    Hallucinations:Hallucinations: None  Ideas of Reference:None  Suicidal Thoughts:Suicidal Thoughts: No  Homicidal Thoughts:Homicidal Thoughts: No   Sensorium  Memory:Immediate Good; Recent Fair; Remote Fair  Judgment:Fair  Insight:Fair   Executive Functions  Concentration:Good  Attention Span:Good  [place] of Knowledge:Good  Language:Good   Psychomotor Activity  Psychomotor Activity:Psychomotor Activity: Normal   Assets  Assets:Communication Skills; Desire for Improvement   Sleep  Sleep:Sleep: Good   No data recorded   Physical Exam  Physical Exam Constitutional:      Appearance: Normal appearance. He is normal weight.  HENT:     Head: Normocephalic and atraumatic.  Eyes:     Extraocular Movements: Extraocular movements intact.  Pulmonary:     Effort: Pulmonary effort is normal.  Neurological:     General: No focal deficit present.     Mental Status: He is alert and oriented to person, place, and time.     Comments: No tremors  Psychiatric:        Attention and Perception: Attention and perception normal.        Speech: Speech normal.        Behavior: Behavior normal. Behavior is cooperative.        Thought Content: Thought content normal.    Review of Systems  Constitutional:  Negative for chills and fever.  HENT:  Negative for hearing loss.   Eyes:  Negative for discharge and redness.  Respiratory:  Negative for cough.   Cardiovascular:  Negative for chest pain.  Gastrointestinal:  Negative for abdominal pain.  Musculoskeletal:  Positive for myalgias.       L sided rib pain (improving) Generalized pain   Neurological:  Negative for headaches.   Blood pressure 132/71, pulse 60, temperature 98.7 F (37.1 C), temperature source Tympanic, resp. rate 18, SpO2 98 %. There is no height or weight on file to calculate  BMI.  Treatment Plan Summary: 61 year old male with history of MDD, benzodiazepine misuse, alcohol use who presented to the [facility] ED on 09/05/2018 for after a fall. He also reported alcohol use and SI. Patient was medically cleared and was recommended for inpatient hospitalization. Patient reported passive SI and desire for substance use treatment on 09/06/21 and was  Patient was accepted to the [facility] on this date. UDS + THC; EtOH 127 on initial presentation.   Most recent CIWA 2. VSS. No SI/HI/AVH. He has been tolerating librium taper well. Patient denies alcohol withdrawal sx although reports generalized pain. Patient remains appropriate for continued admission to the [facility] for ongoing alcohol/bzd detox. Anticipate dc to residential rehab after completing taper-scheduled to end 6/29 Thursday.  AUD, severe -continue CIWA protocol -restart home gabapentin 300 mg TID off label for alcohol withdrawal and anxiety -continue librium detox-25 mg QID --> 25 mg TID--> 25 mg BID--> 25 mg daily -multivitamin -thiamine -Alcohol withdrawal PRNs             -zofran 4 mg q 6 hrs nausea             -loperamide 2-4 mg for diarrhea             -hydroxyzine 25 mg q6 hours for anxiety     MDD  R/o SIMD Anxiety -continue home cymbalta 60 mg daily   HTN -patient reports h/o HTN- BP has been well controlled since admission to [facility]. Patient reports that he stopped taking his BP medications due to being hypotensive, will hold  for now and monitor BP -If patient becomes hypertensive, consider to restart zestoretic 10-12.5 mg -most recent BP 132/71   Rib pain s/p fall -PRN tylenol 650 mg q6 hours   Dispo: ongoing. SW to assist re: residential [facility] placement. Anticipate dc to residential rehab after completion of taper on 6/29 if accepted otherwise will dc to self care  Ival Bible, MD 09/08/2021 1:21 PM

## 2021-09-08 NOTE — Clinical Social Work Psych Note (Signed)
LCSW Update  LCSW met with Zachary [name] yesterday and today to begin discussions regarding treatment and potential discharge planning.    Zachary [name] shared that he slept "alright". Zachary [name] reports having a "blah" mood this morning. Zachary [name] denied having any SI, HI or AVH this morning, however he did share that he experienced an increase in anxiety.   Zachary [name] also reports experiencing a "massive headache" which he attributes to potential withdrawal symptoms. Zachary [name] denied having any other withdrawal symptoms or physical complaints at this time.   Per the patient's initial assessment, "Pt reports, "not in good shape mentally." Pt reports, he was at [facility] a month ago for cutting his wrist as a suicide attempt. Pt reports, he's suicidal with no plan, "I don't want to live." Pt reports, uncontrollable anxiety, "life is a mess right now," are triggers of his suicidal thoughts. Clinician asked the pt if he's experienced any AVH. Pt reports, he keep thinking he's in different places, "weird stuff going on." Pt reports, he has not been a good boyfriend not his girlfriend just want to be friends. Pt denies, HI, self-injurious behaviors. Pt reports, last night drinking two bottles of wine from 6pm-6am. Pt's BAL was 154 at 1257. Pt's UDS is positive for Benzodiazepines and Marijuana".  Zachary [name] expressed to LCSW that he was interest in participating in a residential treatment program following his detox protocol. Per Zachary [name]'s request, LCSW referred the patient to [facility] for review. Per [facility] admissions, the patient is showing having [facility], which is exclusionary criteria. Zachary [name] expressed disappointment, however he reported understanding.   LCSW contacted the following facilities for possible beds:  [facility] - no beds at this time [facility] - no beds at this time [facility]  LCSW will continue to follow for potential placement.     Zachary [name], MSW, LCSW Clinical Education officer, museum ([place]) [facility]

## 2021-09-08 NOTE — ED Notes (Signed)
Patient just awoke from sleep.  He is calm and cooperative however, complaining of anxiety.  Support given.  Patient also has continued rib pain from fall prior to admission.  No evidence of withdrawal from etoh or BZD.  Tolerating librium detox.  Will continue to monitor and provide support as needed.

## 2021-09-09 DIAGNOSIS — F332 Major depressive disorder, recurrent severe without psychotic features: Secondary | ICD-10-CM | POA: Diagnosis not present

## 2021-09-09 DIAGNOSIS — Z79899 Other long term (current) drug therapy: Secondary | ICD-10-CM | POA: Diagnosis not present

## 2021-09-09 DIAGNOSIS — F1514 Other stimulant abuse with stimulant-induced mood disorder: Secondary | ICD-10-CM | POA: Diagnosis not present

## 2021-09-09 DIAGNOSIS — F1020 Alcohol dependence, uncomplicated: Secondary | ICD-10-CM | POA: Diagnosis not present

## 2021-09-09 MED ORDER — DULOXETINE HCL 60 MG PO CPEP: 60.0000 mg | ORAL_CAPSULE | Freq: Every day | ORAL | 0 refills | Status: DC

## 2021-09-09 MED ORDER — GABAPENTIN 300 MG PO CAPS: 300.0000 mg | ORAL_CAPSULE | Freq: Three times a day (TID) | ORAL | 0 refills | Status: DC

## 2021-09-09 NOTE — ED Notes (Signed)
Patient received an after summary visit with community resources to follow up with for substance use outpatient. Patient denies SI, HI and AVH at time of discharge. All belongings returned from [facility] locker. Patient verbalized understanding of medication samples and prescription pick up at [facility]. Patient left in taxi provided by [place].

## 2021-09-09 NOTE — Discharge Instructions (Signed)
Take all medications as prescribed by his/her mental healthcare provider. Report any adverse effects and or reactions from the medicines to your outpatient provider promptly. Do not engage in alcohol and or illegal drug use while on prescription medicines. In the event of worsening symptoms, call the crisis hotline, 911 and or go to the nearest ED for appropriate evaluation and treatment of symptoms. follow-up with your primary care provider for your other medical issues, concerns and or health care needs.   Refills will not be given by the physician you saw on this admission as this physician is not your regular outpatient provider. Please follow up with your outpatient provider for refills. If you do  not have a psychiatrist or psychiatry provider, you may ask your primary care provider for assistance with refills  

## 2021-09-09 NOTE — ED Notes (Signed)
Patient woke up at 2:00 a.m. and asked for Vistaril 10 mg. He was given Vistaril and went back to bed, he is resting quietly with eyes closed, no S/S of distress. Will continue to monitor for safety.

## 2021-09-09 NOTE — ED Provider Notes (Signed)
FBC/OBS ASAP Discharge Summary  Date and Time: 09/09/2021 12:11 PM  Name: Zachary [name]  MRN:  [id number]   Discharge Diagnoses:  Final diagnoses:  Substance induced mood disorder (HCC)  Alcohol use disorder, severe, dependence (HCC)  MDD (major depressive disorder), recurrent severe, without psychosis (HCC)  Benzodiazepine abuse (HCC)    Subjective:  Patient seen and chart reviewed-most recent CIWA 0.  Prior to interview this morning, patient was visualized speaking to staff and peers appropriately.  On interview this morning patient describes his mood as "better".  He denies SI/HI/AVH.  He denies all alcohol withdrawal symptoms including nausea, vomiting, GI upset, tremors, headache, sweating.  Patient stated that he would like to discharge back to his sister's house where he was previously living prior to admission.  Patient states that [facility] has [facility] and will follow up with them regarding medication management. Patient was informed that he will be provided a 7-day sample as well as 30-day prescription sent to pharmacy of choice after which he will need to obtain refills from his outpatient provider.  Patient verbalized understanding and was in agreement.  Stay Summary:  61 year old male with history of MDD, benzodiazepine misuse, alcohol use who presented to the [facility] ED on 09/04/2021 for after a fall. He also reported alcohol use and SI. Patient was medically cleared and was recommended for inpatient hospitalization. Patient reported passive SI and desire for substance use treatment on 09/06/21 and was  accepted to the [facility] on this date. UDS + THC; EtOH 127 on initial presentation. On admission to [facility], he was started on librium taper for etoh detox and his home dose of cymbalta was increased frm 30 mg to 60 mg for mood and he was restarted on gabapentin 300 mg TID offlabel for alcohol use disorder, etoh withdrawal and anxiety. He tolerated librium taper well  throughout stay and CIWA scores ranged from 3-0 throughout stay; last 24 hours CIWA scores were consistently 0. Patient completed taper on 6/29 and was discharged back to his sister's home where he was staying prior to admission.  Initially, patient was interested in attending residential rehab upon discharge however, patient was declined from [facility] due to insurance and no other  facilities had availability.  Patient declined all faith based substance use programs and declined all sober living communities. He was provided with resources for outpatient substance use treatment programs on discharge.  Patient opted to discharge back to his sister's house where he was previously living upon completion of taper.  Total Time spent with patient: 20 minutes  Past Psychiatric History: MDD, benzodiazepine misuse, alcohol use Past Medical History:  Past Medical History:  Diagnosis Date   Alcohol abuse    COPD (chronic obstructive pulmonary disease) (HCC)    Depression    Hypertension     Past Surgical History:  Procedure Laterality Date   COLONOSCOPY     MOUTH SURGERY     NO PAST SURGERIES     Family History:  Family History  Problem Relation Age of Onset   Heart attack Paternal Grandfather    Stroke Mother    Heart failure Mother    Heart failure Father    Family Psychiatric History:  Denies psychiatric history although states that his family is "secretive"  Reports that his father struggled with alcohol use Reports that his mother wrote a suicide note when he was a child but to his knowledge has not attempted; and unknown if she received psychiatric care Social  History:  Social History   Substance and Sexual Activity  Alcohol Use Yes   Comment: BAC was .23 on admission     Social History   Substance and Sexual Activity  Drug Use Yes   Types: Marijuana   Comment: UDS was negative    Social History   Socioeconomic History   Marital status: Divorced    Spouse name: Not on file    Number of children: 2   Years of education: Not on file   Highest education level: Not on file  Occupational History   Occupation: Product/process development scientist  Tobacco Use   Smoking status: Some Days    Packs/day: 2.00    Types: Cigarettes   Smokeless tobacco: Never  Vaping Use   Vaping Use: Not on file  Substance and Sexual Activity   Alcohol use: Yes    Comment: BAC was .23 on admission   Drug use: Yes    Types: Marijuana    Comment: UDS was negative   Sexual activity: Not Currently  Other Topics Concern   Not on file  Social History Narrative   Pt stated that he lives alone; works as a Product/process development scientist   Social Determinants of Corporate investment banker Strain: Not on file  Food Insecurity: Not on file  Transportation Needs: Not on file  Physical Activity: Not on file  Stress: Not on file  Social Connections: Not on file   SDOH:  SDOH Screenings   Alcohol Screen: Medium Risk (07/22/2021)   Alcohol Screen    Last Alcohol Screening Score (AUDIT): 16  Depression (PHQ2-9): Medium Risk (09/06/2021)   Depression (PHQ2-9)    PHQ-2 Score: 27  Financial Resource Strain: Not on file  Food Insecurity: Not on file  Housing: Not on file  Physical Activity: Not on file  Social Connections: Not on file  Stress: Not on file  Tobacco Use: High Risk (09/04/2021)   Patient History    Smoking Tobacco Use: Some Days    Smokeless Tobacco Use: Never    Passive Exposure: Not on file  Transportation Needs: Not on file    Tobacco Cessation:  N/A, patient does not currently use tobacco products  Current Medications:  Current Facility-Administered Medications  Medication Dose Route Frequency Provider Last Rate Last Admin   acetaminophen (TYLENOL) tablet 650 mg  650 mg Oral Q6H PRN Estella Husk, MD   650 mg at 09/08/21 2126   alum & mag hydroxide-simeth (MAALOX/MYLANTA) 200-200-20 MG/5ML suspension 30 mL  30 mL Oral Q4H PRN Estella Husk, MD       chlordiazePOXIDE  (LIBRIUM) capsule 25 mg  25 mg Oral Q6H PRN Estella Husk, MD       DULoxetine (CYMBALTA) DR capsule 60 mg  60 mg Oral Daily Estella Husk, MD   60 mg at 09/09/21 8182   gabapentin (NEURONTIN) capsule 300 mg  300 mg Oral TID Estella Husk, MD   300 mg at 09/09/21 9937   hydrOXYzine (ATARAX) tablet 10 mg  10 mg Oral TID PRN Estella Husk, MD   10 mg at 09/09/21 0837   hydrOXYzine (ATARAX) tablet 25 mg  25 mg Oral Q6H PRN Estella Husk, MD       loperamide (IMODIUM) capsule 2-4 mg  2-4 mg Oral PRN Estella Husk, MD       magnesium hydroxide (MILK OF MAGNESIA) suspension 30 mL  30 mL Oral Daily PRN Estella Husk, MD  multivitamin with minerals tablet 1 tablet  1 tablet Oral Daily Estella Husk, MD   1 tablet at 09/09/21 0838   ondansetron (ZOFRAN-ODT) disintegrating tablet 4 mg  4 mg Oral Q6H PRN Estella Husk, MD   4 mg at 09/07/21 1750   thiamine tablet 100 mg  100 mg Oral Daily Estella Husk, MD   100 mg at 09/09/21 2248   traZODone (DESYREL) tablet 50 mg  50 mg Oral QHS PRN Estella Husk, MD       Current Outpatient Medications  Medication Sig Dispense Refill   [START ON 09/10/2021] DULoxetine (CYMBALTA) 60 MG capsule Take 1 capsule (60 mg total) by mouth daily. 30 capsule 0   gabapentin (NEURONTIN) 300 MG capsule Take 1 capsule (300 mg total) by mouth 3 (three) times daily. 90 capsule 0    PTA Medications: (Not in a hospital admission)      09/09/2021   11:01 AM 09/06/2021    2:23 PM  Depression screen PHQ 2/9  Decreased Interest 0 3  Down, Depressed, Hopeless 3 3  PHQ - 2 Score 3 6  Altered sleeping 0 3  Tired, decreased energy 1 3  Change in appetite 0 3  Feeling bad or failure about yourself  0 3  Trouble concentrating 0 3  Moving slowly or fidgety/restless 0 3  Suicidal thoughts 0 3  PHQ-9 Score 4 27  Difficult doing work/chores Extremely dIfficult Extremely dIfficult    Flowsheet Row ED from  09/06/2021 in [facility] ED from 09/04/2021 in [facility] from 09/01/2021 in [facility] EMERGENCY DEPARTMENT  C-SSRS RISK CATEGORY High Risk High Risk High Risk       Musculoskeletal  Strength & Muscle Tone: within normal limits Gait & Station: normal Patient leans: N/A  Psychiatric Specialty Exam  Presentation  General Appearance: Appropriate for Environment; Casual  Eye Contact:Good  Speech:Clear and Coherent; Normal Rate  Speech Volume:Normal  Handedness:Right   Mood and Affect  Mood:-- ("better")  Affect:Appropriate; Congruent (brighter than yesterday)   Thought Process  Thought Processes:Coherent; Goal Directed; Linear  Descriptions of Associations:Intact  Orientation:Full (Time, Place and Person)  Thought Content:WDL; Logical  Diagnosis of Schizophrenia or Schizoaffective disorder in past: No    Hallucinations:Hallucinations: None  Ideas of Reference:None  Suicidal Thoughts:Suicidal Thoughts: No  Homicidal Thoughts:Homicidal Thoughts: No   Sensorium  Memory:Immediate Good; Recent Good; Remote Good  Judgment:Fair  Insight:Fair   Executive Functions  Concentration:Good  Attention Span:Good  Recall:Good  Fund of Knowledge:Good  Language:Good   Psychomotor Activity  Psychomotor Activity:Psychomotor Activity: Normal   Assets  Assets:Communication Skills; Desire for Improvement   Sleep  Sleep:Sleep: Good   No data recorded  Physical Exam  Physical Exam Constitutional:      Appearance: Normal appearance. He is normal weight.  HENT:     Head: Normocephalic and atraumatic.  Eyes:     Extraocular Movements: Extraocular movements intact.  Pulmonary:     Effort: Pulmonary effort is normal.  Neurological:     General: No focal deficit present.     Mental Status: He is alert and oriented to person, place, and time.     Comments: No tremors    Psychiatric:        Attention and Perception: Attention and perception normal.        Speech: Speech normal.        Behavior: Behavior normal. Behavior is cooperative.  Thought Content: Thought content normal.    Review of Systems  Constitutional:  Negative for chills and fever.  HENT:  Negative for hearing loss.   Eyes:  Negative for discharge and redness.  Respiratory:  Negative for cough.   Cardiovascular:  Negative for chest pain.  Gastrointestinal:  Negative for abdominal pain.  Musculoskeletal:  Negative for myalgias.  Neurological:  Negative for headaches.  Psychiatric/Behavioral:  Positive for substance abuse. Negative for depression, hallucinations and suicidal ideas.    Blood pressure 128/71, pulse 62, temperature 98.7 F (37.1 C), temperature source Oral, resp. rate 18, SpO2 98 %. There is no height or weight on file to calculate BMI.  Demographic Factors:  Male, Caucasian, and Low socioeconomic status  Loss Factors: Financial problems/change in socioeconomic status  Historical Factors: Prior suicide attempts, Family history of mental illness or substance abuse, and Impulsivity  Risk Reduction Factors:   Sense of responsibility to family, Employed, and Positive social support  Continued Clinical Symptoms:  Depression:   Comorbid alcohol abuse/dependence Alcohol/Substance Abuse/Dependencies Previous Psychiatric Diagnoses and Treatments Medical Diagnoses and Treatments/Surgeries  Cognitive Features That Contribute To Risk:  Thought constriction (tunnel vision)    Suicide Risk:  Mild:  Suicidal ideation of limited frequency, intensity, duration, and specificity.  There are no identifiable plans, no associated intent, mild dysphoria and related symptoms, good self-control (both objective and subjective assessment), few other risk factors, and identifiable protective factors, including available and accessible social support.  Plan Of Care/Follow-up  recommendations:  Activity:  as tolerated Diet:  regular Other:     Take all medications as prescribed by his/her mental healthcare provider. Report any adverse effects and or reactions from the medicines to your outpatient provider promptly. Do not engage in alcohol and or illegal drug use while on prescription medicines. In the event of worsening symptoms, call the crisis hotline, 911 and or go to the nearest ED for appropriate evaluation and treatment of symptoms. follow-up with your primary care provider for your other medical issues, concerns and or health care needs.   Allergies as of 09/09/2021       Reactions   Penicillins Other (See Comments)   Childhood allergy Reaction:  Unknown  Has patient had a PCN reaction causing immediate rash, facial/tongue/throat swelling, SOB or lightheadedness with hypotension: Unknown Has patient had a PCN reaction causing severe rash involving mucus membranes or skin necrosis: Unknown Has patient had a PCN reaction that required hospitalization Unknown Has patient had a PCN reaction occurring within the last 10 years: No If all of the above answers are "NO", then may proceed with Cephalosporin use.   Statins Palpitations        Medication List     STOP taking these medications    clonazePAM 1 MG tablet Commonly known as: KlonoPIN   naltrexone 50 MG tablet Commonly known as: DEPADE   pantoprazole 40 MG tablet Commonly known as: PROTONIX       TAKE these medications    DULoxetine 60 MG capsule Commonly known as: CYMBALTA Take 1 capsule (60 mg total) by mouth daily. Start taking on: September 10, 2021 What changed:  medication strength how much to take when to take this   gabapentin 300 MG capsule Commonly known as: NEURONTIN Take 1 capsule (300 mg total) by mouth 3 (three) times daily.        Patient was provided with 7 day samples of above medications as well as paper prescriptions for 30 days with 0 refills  at  time of  discharge.  Patient was provided with follow up information regarding psychiatric outpatient resources in [place] with the assistance of SW prior to discharge.    Patient was informed that refills will not be given by the physician you saw on this admission as this physician is not your regular outpatient provider. Please follow up with your outpatient provider for refills. If you do  not have a psychiatrist or psychiatry provider, you may ask your primary care provider for assistance with refills   Disposition: self care  Estella HuskKatherine S Kenrick Pore, MD 09/09/2021, 12:11 PM

## 2021-09-09 NOTE — Progress Notes (Signed)
Patient is alert and oriented X 4,denies SI, HI and AVH this morning. Patient states, " I am suppose to be going home today as of yesterday there was not a facility I could go to but that is okay I feel fine." Patient is pleasant and calm, appropriate on the unit toward staff and peers. Patient received scheduled morning medications. No acute behaviors observed. Nursing staff will continue to monitor.

## 2021-09-09 NOTE — ED Notes (Signed)
Patient is resting quietly with eyes closed, no S/S of distress. Respirations even and unlabored. Will continue to monitor for safety. ?

## 2021-10-26 ENCOUNTER — Other Ambulatory Visit: Payer: Self-pay

## 2021-10-26 ENCOUNTER — Encounter (HOSPITAL_COMMUNITY): Payer: Self-pay

## 2021-10-26 ENCOUNTER — Emergency Department (HOSPITAL_COMMUNITY)
Admission: EM | Admit: 2021-10-26 | Discharge: 2021-10-27 | Disposition: A | Discharge status: Home / Self Care | Payer: Medicaid Other | Attending: Emergency Medicine | Admitting: Emergency Medicine

## 2021-10-26 DIAGNOSIS — J449 Chronic obstructive pulmonary disease, unspecified: Secondary | ICD-10-CM | POA: Diagnosis not present

## 2021-10-26 DIAGNOSIS — I10 Essential (primary) hypertension: Secondary | ICD-10-CM | POA: Diagnosis not present

## 2021-10-26 DIAGNOSIS — R5383 Other fatigue: Secondary | ICD-10-CM | POA: Diagnosis not present

## 2021-10-26 DIAGNOSIS — D72829 Elevated white blood cell count, unspecified: Secondary | ICD-10-CM | POA: Insufficient documentation

## 2021-10-26 DIAGNOSIS — R21 Rash and other nonspecific skin eruption: Secondary | ICD-10-CM | POA: Insufficient documentation

## 2021-10-26 DIAGNOSIS — Z79899 Other long term (current) drug therapy: Secondary | ICD-10-CM | POA: Diagnosis not present

## 2021-10-26 DIAGNOSIS — M791 Myalgia, unspecified site: Secondary | ICD-10-CM | POA: Insufficient documentation

## 2021-10-26 LAB — CBC
HCT: 44.5 % (ref 39.0–52.0)
Hemoglobin: 16.2 g/dL (ref 13.0–17.0)
MCH: 32.9 pg (ref 26.0–34.0)
MCHC: 36.4 g/dL — ABNORMAL HIGH (ref 30.0–36.0)
MCV: 90.4 fL (ref 80.0–100.0)
Platelets: 230 10*3/uL (ref 150–400)
RBC: 4.92 MIL/uL (ref 4.22–5.81)
RDW: 12.6 % (ref 11.5–15.5)
WBC: 12.1 10*3/uL — ABNORMAL HIGH (ref 4.0–10.5)
nRBC: 0 % (ref 0.0–0.2)

## 2021-10-26 LAB — URINALYSIS, ROUTINE W REFLEX MICROSCOPIC
Bilirubin Urine: NEGATIVE
Glucose, UA: NEGATIVE mg/dL
Hgb urine dipstick: NEGATIVE
Ketones, ur: NEGATIVE mg/dL
Leukocytes,Ua: NEGATIVE
Nitrite: NEGATIVE
Protein, ur: 30 mg/dL — AB
Specific Gravity, Urine: 1.018 (ref 1.005–1.030)
pH: 7 (ref 5.0–8.0)

## 2021-10-26 LAB — BASIC METABOLIC PANEL
Anion gap: 10 (ref 5–15)
BUN: 8 mg/dL (ref 8–23)
CO2: 29 mmol/L (ref 22–32)
Calcium: 9.9 mg/dL (ref 8.9–10.3)
Chloride: 104 mmol/L (ref 98–111)
Creatinine, Ser: 1.04 mg/dL (ref 0.61–1.24)
GFR, Estimated: >60 mL/min (ref >60)
Glucose, Bld: 137 mg/dL — ABNORMAL HIGH (ref 70–99)
Potassium: 3.7 mmol/L (ref 3.5–5.1)
Sodium: 143 mmol/L (ref 135–145)

## 2021-10-26 NOTE — ED Triage Notes (Addendum)
Patient said he has a rash that spread all over his body. He noticed it on his arms last night then today it is all over his legs too. He stated he feels weak and has cold chills, sweaty, no appetite, no sleep. He said he had tuna for the first time in a while, but does not think he is allergic.

## 2021-10-26 NOTE — ED Provider Triage Note (Signed)
Emergency Medicine Provider Triage Evaluation Note  Zachary [name] , a 61 y.o. male  was evaluated in triage.  Pt complains of generalized fatigue and weakness, body aches and chills, nausea since the last 2 days.  Patient states beginning yesterday he also developed a rash that is nonblanching, does not itch, burn or sting.  This rash is located on the patient's trunk, bilateral arms, bilateral legs.  Patient reports that 2 weeks ago he started taking Seroquel.  The patient denies any fevers at home.  Patient reports that he took 3 of his lisinopril was prior to arrival because he was "concerned about stroke".  Patient noted to be hypotensive and tachycardic in triage.  Review of Systems  Positive:  Negative:   Physical Exam  BP 110/82 (BP Location: Left Arm)   Pulse (!) 109   Temp 98.9 F (37.2 C) (Oral)   Resp 20   SpO2 96%  Gen:   Awake, no distress   Resp:  Normal effort  MSK:   Moves extremities without difficulty  Other:    Medical Decision Making  Medically screening exam initiated at 9:25 PM.  Appropriate orders placed.  Zachary [name] was informed that the remainder of the evaluation will be completed by another provider, this initial triage assessment does not replace that evaluation, and the importance of remaining in the ED until their evaluation is complete.     Al Decant, PA-C 10/26/21 2125

## 2021-10-27 LAB — HIV ANTIBODY (ROUTINE TESTING W REFLEX): HIV Screen 4th Generation wRfx: NONREACTIVE

## 2021-10-27 LAB — HEPATIC FUNCTION PANEL
ALT: 24 U/L (ref 0–44)
AST: 24 U/L (ref 15–41)
Albumin: 4.5 g/dL (ref 3.5–5.0)
Alkaline Phosphatase: 102 U/L (ref 38–126)
Bilirubin, Direct: 0.1 mg/dL (ref 0.0–0.2)
Indirect Bilirubin: 0.3 mg/dL (ref 0.3–0.9)
Total Bilirubin: 0.4 mg/dL (ref 0.3–1.2)
Total Protein: 7.4 g/dL (ref 6.5–8.1)

## 2021-10-27 LAB — GC/CHLAMYDIA PROBE AMP (~~LOC~~) NOT AT ARMC
Chlamydia: NEGATIVE
Comment: NEGATIVE
Comment: NORMAL
Neisseria Gonorrhea: NEGATIVE

## 2021-10-27 LAB — RPR: RPR Ser Ql: NONREACTIVE

## 2021-10-27 MED ORDER — DOXYCYCLINE HYCLATE 100 MG PO TABS
100.0000 mg | ORAL_TABLET | Freq: Once | ORAL | Status: AC
  Administered 2021-10-27: 100 mg via ORAL
  Filled 2021-10-27: qty 1

## 2021-10-27 MED ORDER — DOXYCYCLINE HYCLATE 100 MG PO CAPS
100.0000 mg | ORAL_CAPSULE | Freq: Two times a day (BID) | ORAL | 0 refills | Status: AC
Start: 2021-10-27 — End: 2021-11-06

## 2021-10-27 NOTE — ED Notes (Signed)
Patient given [place] sandwich, graham crackers, shasta, and apple sauce per request.

## 2021-10-27 NOTE — ED Provider Notes (Signed)
[facility]-EMERGENCY DEPT Provider Note   CSN: [id number] Arrival date & time: 10/26/21  1950     History  Chief Complaint  Patient presents with   Rash    Zachary [name] is a 61 y.o. male.  HPI   With medical history including hypertension, COPD, alcohol dependency, depression presents with complaints of a rash.  Patient states the rash started yesterday, states initially was on his arms but now has spread throughout his body, he states that the rash is nonpainful, non-itchy, does not endorse any tongue throat lip swelling difficulty breathing no GI symptoms.  He states that he has been feeling generalized fatigue and general body aches for last couple days, he states that he did note he had a tick bite on his right groin 1 month ago, denies any rash on the area.  He also states he is sexually active, states he did have sexual intercourse 1 month ago unprotected, he denies any testicular pain penile discharge, no rectal pain.  He states that he is up-to-date on all childhood vaccines, not immunocompromise, he is never had this in the past.    Home Medications Prior to Admission medications   Medication Sig Start Date End Date Taking? Authorizing Provider  DULoxetine (CYMBALTA) 30 MG capsule Take 90 mg by mouth daily. 10/05/21  Yes [provider]  lisinopril-hydrochlorothiazide (ZESTORETIC) 10-12.5 MG tablet Take 1 tablet by mouth daily.   Yes [provider]  QUEtiapine (SEROQUEL) 100 MG tablet Take 50-200 mg by mouth at bedtime. 10/14/21  Yes [provider]  cloNIDine (CATAPRES) 0.1 MG tablet Take 0.1 mg by mouth daily. Patient not taking: Reported on 10/26/2021 10/05/21   [provider]  doxycycline (VIBRAMYCIN) 100 MG capsule Take 1 capsule (100 mg total) by mouth 2 (two) times daily for 10 days. 10/27/21 11/06/21 Yes Carroll Sage, PA-C  DULoxetine (CYMBALTA) 60 MG capsule Take 1 capsule (60 mg total) by mouth  daily. Patient not taking: Reported on 10/26/2021 09/10/21   Estella Husk, MD  gabapentin (NEURONTIN) 300 MG capsule Take 1 capsule (300 mg total) by mouth 3 (three) times daily. Patient not taking: Reported on 10/26/2021 09/09/21 10/09/21  Estella Husk, MD  naltrexone (DEPADE) 50 MG tablet Take 50 mg by mouth daily. Patient not taking: Reported on 10/26/2021 10/14/21   [provider]      Allergies    Penicillins and Statins    Review of Systems   Review of Systems  Constitutional:  Positive for fatigue. Negative for chills and fever.  Respiratory:  Negative for shortness of breath.   Cardiovascular:  Negative for chest pain.  Gastrointestinal:  Negative for abdominal pain.  Musculoskeletal:  Positive for myalgias.  Skin:  Positive for rash.  Neurological:  Negative for headaches.    Physical Exam Updated Vital Signs BP (!) 144/97   Pulse 94   Temp 98.3 F (36.8 C) (Oral)   Resp 18   SpO2 100%  Physical Exam Vitals and nursing note reviewed.  Constitutional:      General: He is not in acute distress.    Appearance: He is not ill-appearing.  HENT:     Head: Normocephalic and atraumatic.     Nose: No congestion.     Mouth/Throat:     Mouth: Mucous membranes are moist.     Pharynx: Oropharynx is clear. No oropharyngeal exudate or posterior oropharyngeal erythema.     Comments: No trismus no torticollis no oral edema,  tongue uvula midline controlling oral secretions tonsils are both equal symmetric bilaterally no submandibular swelling. Eyes:     Conjunctiva/sclera: Conjunctivae normal.  Cardiovascular:     Rate and Rhythm: Normal rate and regular rhythm.     Pulses: Normal pulses.     Heart sounds: No murmur heard.    No friction rub. No gallop.  Pulmonary:     Effort: No respiratory distress.     Breath sounds: No wheezing, rhonchi or rales.  Abdominal:     Palpations: Abdomen is soft.     Tenderness: There is no abdominal tenderness. There is no  right CVA tenderness or left CVA tenderness.  Skin:    General: Skin is warm and dry.     Comments: Patient has a maculopapular like rash which covers his body in the palms and soles of feet, blanch with pressure, nonpainful, no scaling of the skin.  Please see picture for full detail  Neurological:     Mental Status: He is alert.  Psychiatric:        Mood and Affect: Mood normal.            ED Results / Procedures / Treatments   Labs (all labs ordered are listed, but only abnormal results are displayed) Labs Reviewed  CBC - Abnormal; Notable for the following components:      Result Value   WBC 12.1 (*)    MCHC 36.4 (*)    All other components within normal limits  BASIC METABOLIC PANEL - Abnormal; Notable for the following components:   Glucose, Bld 137 (*)    All other components within normal limits  URINALYSIS, ROUTINE W REFLEX MICROSCOPIC - Abnormal; Notable for the following components:   Protein, ur 30 (*)    Bacteria, UA RARE (*)    All other components within normal limits  HEPATIC FUNCTION PANEL  RPR  HIV ANTIBODY (ROUTINE TESTING W REFLEX)  LYME DISEASE SEROLOGY W/REFLEX  GC/CHLAMYDIA PROBE AMP ([place]) NOT AT [facility]    EKG None  Radiology No results found.  Procedures Procedures    Medications Ordered in ED Medications  doxycycline (VIBRA-TABS) tablet 100 mg (100 mg Oral Given 10/27/21 0045)    ED Course/ Medical Decision Making/ A&P                           Medical Decision Making Amount and/or Complexity of Data Reviewed Labs: ordered.  Risk Prescription drug management.   This patient presents to the ED for concern of rash, this involves an extensive number of treatment options, and is a complaint that carries with it a high risk of complications and morbidity.  The differential diagnosis includes T EN, Stevens-Johnson's, anaphylaxis, syphilis, disseminated gonorrhea, [place] spotted fever    Additional history  obtained:  Additional history obtained from N/A External records from outside source obtained and reviewed including ER notes   Co morbidities that complicate the patient evaluation  Polysubstance dependency  Social Determinants of Health:  N/A    Lab Tests:  I Ordered, and personally interpreted labs.  The pertinent results include: CBC shows slight leukocytosis of 12.1, BMP shows glucose of 137, UA unremarkable, syphilis and HIV are pending at this time, hepatic panel unremarkable   Imaging Studies ordered:  I ordered imaging studies including N/A I independently visualized and interpreted imaging which showed N/A I agree with the radiologist interpretation   Cardiac Monitoring:  The patient was maintained on  a cardiac monitor.  I personally viewed and interpreted the cardiac monitored which showed an underlying rhythm of: Without signs of ischemia   Medicines ordered and prescription drug management:  I ordered medication including doxycycline I have reviewed the patients home medicines and have made adjustments as needed  Critical Interventions:  N/A  Reevaluation:  Presents with a rash, triage obtain lab work which I first reviewed, will add on GC chlamydia, syphilis, HIV, as well as lipid panel.  It was noted that he had a BP that read 81/53, suspect this was erroneous as the next blood pressure was 102/63.   Reassessed patient resting comfortably, he is tolerating p.o., vital signs have been unremarkable he is agreement with plan and discharge at this time.   Consultations Obtained:  N/A   Test Considered:  Considered admission-this we deferred as he is nontoxic-appearing, vital signs reassuring, he is stable for discharge    Rule out Low suspicion for systemic infection patient is nontoxic-appearing vital signs are reassuring, does have a slight leukocytosis which I suspect is a inflammatory response secondary due to the rash.  Low suspicion for  TENS or Stevens-Johnson's no skin sloughing, presentation is atypical.  I have low suspicion for anaphylaxis or angioedema as there is no airway compromise no GI symptoms vital signs are reassuring.  Low suspicion for gonorrhea is not endorsing dysuria or testicular pain no rectal pain.     Dispostion and problem list  After consideration of the diagnostic results and the patients response to treatment, I feel that the patent would benefit from discharge.  Rash-unclear etiology, concerned that this is a [facility] spotted fever, will treat, he got his first dose today.  will  wait syphilis testing, if positive will discontinue the Doxy and start him on penicillin.  I gave him strict return precautions.            Final Clinical Impression(s) / ED Diagnoses Final diagnoses:  Rash    Rx / DC Orders ED Discharge Orders          Ordered    doxycycline (VIBRAMYCIN) 100 MG capsule  2 times daily        10/27/21 0059              Carroll Sage, PA-C 10/27/21 0102    Shon Baton, MD 10/27/21 [phone]

## 2021-10-27 NOTE — Discharge Instructions (Signed)
I suspect you have [facility] spotted fever,  starting you on antibiotics please take as prescribed.  If it is this, your symptoms should improve in the next 3 to 5 days.  Your syphilis as well as your gonorrhea and Chlamydia testing is pending at this time you will see it in the next 24 to 48 hours under MyChart account please check as if is it positive will need to change her antibiotics.  If symptoms are worsening please come back to the emergency department.

## 2021-10-28 LAB — LYME DISEASE SEROLOGY W/REFLEX: Lyme Total Antibody EIA: NEGATIVE

## 2021-10-31 ENCOUNTER — Other Ambulatory Visit: Payer: Self-pay

## 2021-10-31 ENCOUNTER — Emergency Department (HOSPITAL_COMMUNITY)
Admission: EM | Admit: 2021-10-31 | Discharge: 2021-10-31 | Disposition: A | Discharge status: Home / Self Care | Payer: Medicaid Other | Attending: Emergency Medicine | Admitting: Emergency Medicine

## 2021-10-31 ENCOUNTER — Emergency Department (HOSPITAL_COMMUNITY): Payer: Medicaid Other

## 2021-10-31 ENCOUNTER — Encounter (HOSPITAL_COMMUNITY): Payer: Self-pay | Admitting: Oncology

## 2021-10-31 DIAGNOSIS — Z20822 Contact with and (suspected) exposure to covid-19: Secondary | ICD-10-CM | POA: Diagnosis not present

## 2021-10-31 DIAGNOSIS — J029 Acute pharyngitis, unspecified: Secondary | ICD-10-CM | POA: Diagnosis not present

## 2021-10-31 DIAGNOSIS — I10 Essential (primary) hypertension: Secondary | ICD-10-CM | POA: Insufficient documentation

## 2021-10-31 DIAGNOSIS — Z79899 Other long term (current) drug therapy: Secondary | ICD-10-CM | POA: Diagnosis not present

## 2021-10-31 DIAGNOSIS — R0989 Other specified symptoms and signs involving the circulatory and respiratory systems: Secondary | ICD-10-CM | POA: Insufficient documentation

## 2021-10-31 DIAGNOSIS — J449 Chronic obstructive pulmonary disease, unspecified: Secondary | ICD-10-CM | POA: Diagnosis not present

## 2021-10-31 LAB — RESP PANEL BY RT-PCR (FLU A&B, COVID) ARPGX2
Influenza A by PCR: NEGATIVE
Influenza B by PCR: NEGATIVE
SARS Coronavirus 2 by RT PCR: NEGATIVE

## 2021-10-31 LAB — GROUP A STREP BY PCR: Group A Strep by PCR: NOT DETECTED

## 2021-10-31 MED ORDER — EPINEPHRINE 0.3 MG/0.3ML IJ SOAJ: 0.3000 mg | INTRAMUSCULAR | 0 refills | Status: DC | PRN

## 2021-10-31 MED ORDER — SUCRALFATE 1 G PO TABS: 1.0000 g | ORAL_TABLET | Freq: Three times a day (TID) | ORAL | 0 refills | Status: DC

## 2021-10-31 MED ORDER — OMEPRAZOLE 20 MG PO CPDR: 20.0000 mg | DELAYED_RELEASE_CAPSULE | Freq: Every day | ORAL | 0 refills | Status: DC

## 2021-10-31 NOTE — ED Provider Notes (Signed)
[facility]-EMERGENCY DEPT Provider Note   CSN: [id number] Arrival date & time: 10/31/21  1138     History  Chief Complaint  Patient presents with   Sore Throat    Zachary [name] is a 61 y.o. male with medical history of COPD, hypertension, alcohol abuse.  The patient presents to ED for evaluation of sore throat, globus sensation.  The patient reports that he was recently started on doxycycline for [facility] spotted fever this past Tuesday.  The patient reports that he has taken multiple doses of the medication without issue.  The patient reports that this morning he woke up and for the first time in a while, felt "normal".  The patient states that he went to [employer], got himself breakfast platter, ate this breakfast platter and returned home where he fell back asleep.  Patient states that upon waking up from his nap, he had a sensation of being unable to swallow.  The patient states that he had excessive coughing at this time along with gagging.  The patient reports that this resolved and he presented for emergency evaluation.  Patient denies any fevers, nausea or vomiting, urticarial hives, trouble swallowing.  Patient states he is able to swallow liquids.  Patient denies any history of esophageal dilation, food boluses.   Sore Throat Pertinent negatives include no abdominal pain and no shortness of breath.       Home Medications Prior to Admission medications   Medication Sig Start Date End Date Taking? Authorizing Provider  omeprazole (PRILOSEC) 20 MG capsule Take 1 capsule (20 mg total) by mouth daily. 10/31/21  Yes Al Decant, PA-C  sucralfate (CARAFATE) 1 g tablet Take 1 tablet (1 g total) by mouth 4 (four) times daily -  with meals and at bedtime. 10/31/21  Yes Al Decant, PA-C  cloNIDine (CATAPRES) 0.1 MG tablet Take 0.1 mg by mouth daily. Patient not taking: Reported on 10/26/2021 10/05/21   [provider]  doxycycline  (VIBRAMYCIN) 100 MG capsule Take 1 capsule (100 mg total) by mouth 2 (two) times daily for 10 days. 10/27/21 11/06/21  Carroll Sage, PA-C  DULoxetine (CYMBALTA) 30 MG capsule Take 90 mg by mouth daily. 10/05/21   [provider]  DULoxetine (CYMBALTA) 60 MG capsule Take 1 capsule (60 mg total) by mouth daily. Patient not taking: Reported on 10/26/2021 09/10/21   Estella Husk, MD  gabapentin (NEURONTIN) 300 MG capsule Take 1 capsule (300 mg total) by mouth 3 (three) times daily. Patient not taking: Reported on 10/26/2021 09/09/21 10/09/21  Estella Husk, MD  lisinopril-hydrochlorothiazide (ZESTORETIC) 10-12.5 MG tablet Take 1 tablet by mouth daily.    [provider]  naltrexone (DEPADE) 50 MG tablet Take 50 mg by mouth daily. Patient not taking: Reported on 10/26/2021 10/14/21   [provider]  QUEtiapine (SEROQUEL) 100 MG tablet Take 50-200 mg by mouth at bedtime. 10/14/21   [provider]      Allergies    Penicillins and Statins    Review of Systems   Review of Systems  Constitutional:  Negative for fever.  HENT:  Positive for sore throat. Negative for drooling and trouble swallowing.   Respiratory:  Negative for shortness of breath.   Gastrointestinal:  Positive for nausea. Negative for abdominal pain and vomiting.  Skin:  Negative for rash.  All other systems reviewed and are negative.   Physical Exam Updated Vital Signs BP (!) 147/82   Pulse 92  Temp 98.8 F (37.1 C)   Resp 12   Ht 5\' 10"  (1.778 m)   Wt 63.5 kg   SpO2 95%   BMI 20.09 kg/m  Physical Exam Vitals and nursing note reviewed.  Constitutional:      General: He is not in acute distress.    Appearance: He is not ill-appearing, toxic-appearing or diaphoretic.  HENT:     Head: Normocephalic and atraumatic.     Nose: No congestion.     Mouth/Throat:     Pharynx: Uvula midline. Posterior oropharyngeal erythema present. No oropharyngeal exudate or uvula swelling.      Tonsils: No tonsillar exudate or tonsillar abscesses. 0 on the right. 0 on the left.  Cardiovascular:     Rate and Rhythm: Normal rate and regular rhythm.  Pulmonary:     Effort: Pulmonary effort is normal.     Breath sounds: Normal breath sounds. No wheezing.  Abdominal:     General: Bowel sounds are normal.     Palpations: Abdomen is soft.     Tenderness: There is no abdominal tenderness.  Musculoskeletal:     Cervical back: Normal range of motion and neck supple. No tenderness.  Skin:    General: Skin is warm and dry.     Capillary Refill: Capillary refill takes less than 2 seconds.  Neurological:     Mental Status: He is alert and oriented to person, place, and time.     ED Results / Procedures / Treatments   Labs (all labs ordered are listed, but only abnormal results are displayed) Labs Reviewed  GROUP A STREP BY PCR  RESP PANEL BY RT-PCR (FLU A&B, COVID) ARPGX2    EKG None  Radiology DG Neck Soft Tissue  Result Date: 10/31/2021 CLINICAL DATA:  Globus sensation EXAM: NECK SOFT TISSUES - 1+ VIEW COMPARISON:  None Available. FINDINGS: There is no evidence of retropharyngeal soft tissue swelling or epiglottic enlargement. The cervical airway is unremarkable and no radio-opaque foreign body identified. Severe multilevel disc degenerative disease and osteophytosis of the cervical spine. IMPRESSION: 1. No radiopaque foreign body. 2. Severe multilevel disc degenerative disease and osteophytosis of the cervical spine. Electronically Signed   By: 11/02/2021 M.D.   On: 10/31/2021 13:49    Procedures Procedures   Medications Ordered in ED Medications - No data to display  ED Course/ Medical Decision Making/ A&P                           Medical Decision Making Amount and/or Complexity of Data Reviewed Radiology: ordered.   61 year old male with medical history of hypertension, depression, COPD, alcohol abuse presents to ED for evaluation of sensation in throat.   Please see HPI for further details.  On examination the patient is afebrile and nontachycardic.  The patient lung sounds are clear bilaterally, he is not hypoxic.  Patient posterior pharynx shows no signs of erythema, exudate.  Patient able to swallow secretions appropriately, uvula is midline, patient airway patent.  Patient nonhypoxic.  Patient worked up utilizing the following labs and imaging studies interpreted by me personally: - Plain film imaging of neck soft tissue does not show any foreign body, food bolus - Group A strep negative - Respiratory panel negative  Patient will be given strict return precautions and advised to follow-up with GI.  The patient was advised that if he becomes unable to swallow liquids, begins vomiting he will need to come back to the  ED.  Patient voiced understanding.  Patient will be discharged home with Carafate, omeprazole.  Final Clinical Impression(s) / ED Diagnoses Final diagnoses:  Globus sensation    Rx / DC Orders ED Discharge Orders          Ordered    omeprazole (PRILOSEC) 20 MG capsule  Daily        10/31/21 1524    sucralfate (CARAFATE) 1 g tablet  3 times daily with meals & bedtime        10/31/21 1524              Al Decant, PA-C 10/31/21 1525    Derwood Kaplan, MD 11/05/21 1457

## 2021-10-31 NOTE — Discharge Instructions (Signed)
Please return to the ED with any new symptoms such as inability to swallow liquids, vomiting Please follow-up with the GI doctor I have referred you to.  You will need to call and make an appointment to be seen. Please pick up medication sent in for you

## 2021-10-31 NOTE — ED Triage Notes (Signed)
Pt bib [facility] d/t waking up feeling as if something was stuck in his throat. Pt reports starting doxycycline on Tuesday for a dx of [facility] Spotted Fever.  Pt able to speak in full sentences.

## 2021-11-24 ENCOUNTER — Emergency Department (HOSPITAL_COMMUNITY)
Admission: EM | Admit: 2021-11-24 | Discharge: 2021-11-24 | Disposition: A | Discharge status: Home / Self Care | Payer: Medicaid Other | Attending: Emergency Medicine | Admitting: Emergency Medicine

## 2021-11-24 ENCOUNTER — Other Ambulatory Visit: Payer: Self-pay

## 2021-11-24 DIAGNOSIS — J449 Chronic obstructive pulmonary disease, unspecified: Secondary | ICD-10-CM | POA: Insufficient documentation

## 2021-11-24 DIAGNOSIS — R531 Weakness: Secondary | ICD-10-CM | POA: Insufficient documentation

## 2021-11-24 DIAGNOSIS — Z20822 Contact with and (suspected) exposure to covid-19: Secondary | ICD-10-CM | POA: Diagnosis not present

## 2021-11-24 DIAGNOSIS — Z79899 Other long term (current) drug therapy: Secondary | ICD-10-CM | POA: Diagnosis not present

## 2021-11-24 DIAGNOSIS — I10 Essential (primary) hypertension: Secondary | ICD-10-CM | POA: Diagnosis not present

## 2021-11-24 DIAGNOSIS — R6889 Other general symptoms and signs: Secondary | ICD-10-CM

## 2021-11-24 LAB — COMPREHENSIVE METABOLIC PANEL
ALT: 43 U/L (ref 0–44)
AST: 30 U/L (ref 15–41)
Albumin: 4.1 g/dL (ref 3.5–5.0)
Alkaline Phosphatase: 70 U/L (ref 38–126)
Anion gap: 6 (ref 5–15)
BUN: 19 mg/dL (ref 8–23)
CO2: 27 mmol/L (ref 22–32)
Calcium: 9.4 mg/dL (ref 8.9–10.3)
Chloride: 105 mmol/L (ref 98–111)
Creatinine, Ser: 0.99 mg/dL (ref 0.61–1.24)
GFR, Estimated: >60 mL/min (ref >60)
Glucose, Bld: 95 mg/dL (ref 70–99)
Potassium: 3.8 mmol/L (ref 3.5–5.1)
Sodium: 138 mmol/L (ref 135–145)
Total Bilirubin: 0.8 mg/dL (ref 0.3–1.2)
Total Protein: 6.5 g/dL (ref 6.5–8.1)

## 2021-11-24 LAB — CBC WITH DIFFERENTIAL/PLATELET
Abs Immature Granulocytes: 0.23 10*3/uL — ABNORMAL HIGH (ref 0.00–0.07)
Basophils Absolute: 0.1 10*3/uL (ref 0.0–0.1)
Basophils Relative: 1 %
Eosinophils Absolute: 0.3 10*3/uL (ref 0.0–0.5)
Eosinophils Relative: 3 %
HCT: 39.4 % (ref 39.0–52.0)
Hemoglobin: 14.1 g/dL (ref 13.0–17.0)
Immature Granulocytes: 3 %
Lymphocytes Relative: 24 %
Lymphs Abs: 1.9 10*3/uL (ref 0.7–4.0)
MCH: 33.7 pg (ref 26.0–34.0)
MCHC: 35.8 g/dL (ref 30.0–36.0)
MCV: 94.3 fL (ref 80.0–100.0)
Monocytes Absolute: 1.1 10*3/uL — ABNORMAL HIGH (ref 0.1–1.0)
Monocytes Relative: 14 %
Neutro Abs: 4.5 10*3/uL (ref 1.7–7.7)
Neutrophils Relative %: 55 %
Platelets: 208 10*3/uL (ref 150–400)
RBC: 4.18 MIL/uL — ABNORMAL LOW (ref 4.22–5.81)
RDW: 13.7 % (ref 11.5–15.5)
WBC: 8.1 10*3/uL (ref 4.0–10.5)
nRBC: 0 % (ref 0.0–0.2)

## 2021-11-24 LAB — RESP PANEL BY RT-PCR (FLU A&B, COVID) ARPGX2
Influenza A by PCR: NEGATIVE
Influenza B by PCR: NEGATIVE
SARS Coronavirus 2 by RT PCR: NEGATIVE

## 2021-11-24 MED ORDER — ACETAMINOPHEN 500 MG PO TABS
1000.0000 mg | ORAL_TABLET | Freq: Once | ORAL | Status: AC
Start: 2021-11-24 — End: 2021-11-24
  Administered 2021-11-24: 1000 mg via ORAL
  Filled 2021-11-24: qty 2

## 2021-11-24 NOTE — ED Provider Notes (Signed)
[facility]-EMERGENCY DEPT Provider Note   CSN: [id number] Arrival date & time: 11/24/21  0501     History  Chief Complaint  Patient presents with   Weakness    Zachary [name] is a 61 y.o. male.  The history is provided by the patient and medical records.  Weakness Associated symptoms: headaches    61 year old male with history of alcohol abuse, COPD, hypertension, depression, prolonged QT, presenting to the ED with "feeling unwell".  States he has not felt well for the past few days.  He reports generalized weakness, headache, sore throat, and chills.  He denies any fever.  No sick contacts.  He does report he was treated last month for possible RMSF, he completed full course of doxycycline.  States rash has since dissipated.  He denies any diffuse joint pain, unexplained fevers, weight loss, vomiting, or diarrhea.  He has been able to eat and drink.  Home Medications Prior to Admission medications   Medication Sig Start Date End Date Taking? Authorizing Provider  cloNIDine (CATAPRES) 0.1 MG tablet Take 0.1 mg by mouth daily. Patient not taking: Reported on 10/26/2021 10/05/21   [provider]  DULoxetine (CYMBALTA) 30 MG capsule Take 90 mg by mouth daily. 10/05/21   [provider]  DULoxetine (CYMBALTA) 60 MG capsule Take 1 capsule (60 mg total) by mouth daily. Patient not taking: Reported on 10/26/2021 09/10/21   Estella Husk, MD  EPINEPHrine 0.3 mg/0.3 mL IJ SOAJ injection Inject 0.3 mg into the muscle as needed for anaphylaxis. 10/31/21   Derwood Kaplan, MD  gabapentin (NEURONTIN) 300 MG capsule Take 1 capsule (300 mg total) by mouth 3 (three) times daily. Patient not taking: Reported on 10/26/2021 09/09/21 10/09/21  Estella Husk, MD  lisinopril-hydrochlorothiazide (ZESTORETIC) 10-12.5 MG tablet Take 1 tablet by mouth daily.    [provider]  naltrexone (DEPADE) 50 MG tablet Take 50 mg by mouth daily. Patient not  taking: Reported on 10/26/2021 10/14/21   [provider]  omeprazole (PRILOSEC) 20 MG capsule Take 1 capsule (20 mg total) by mouth daily. 10/31/21   Al Decant, PA-C  QUEtiapine (SEROQUEL) 100 MG tablet Take 50-200 mg by mouth at bedtime. 10/14/21   [provider]  sucralfate (CARAFATE) 1 g tablet Take 1 tablet (1 g total) by mouth 4 (four) times daily -  with meals and at bedtime. 10/31/21   Al Decant, PA-C      Allergies    Penicillins and Statins    Review of Systems   Review of Systems  Constitutional:  Positive for chills.  HENT:  Positive for sore throat.   Neurological:  Positive for weakness and headaches.  All other systems reviewed and are negative.   Physical Exam Updated Vital Signs BP 105/78   Pulse 82   Temp 98.1 F (36.7 C) (Oral)   Resp 18   Ht 5\' 10"  (1.778 m)   Wt 63.5 kg   SpO2 97%   BMI 20.09 kg/m   Physical Exam Vitals and nursing note reviewed.  Constitutional:      Appearance: He is well-developed.  HENT:     Head: Normocephalic and atraumatic.  Eyes:     Conjunctiva/sclera: Conjunctivae normal.     Pupils: Pupils are equal, round, and reactive to light.  Cardiovascular:     Rate and Rhythm: Normal rate and regular rhythm.     Heart sounds: Normal heart sounds.  Pulmonary:     Effort:  Pulmonary effort is normal.     Breath sounds: Normal breath sounds.  Abdominal:     General: Bowel sounds are normal.     Palpations: Abdomen is soft.     Tenderness: There is no abdominal tenderness. There is no guarding or rebound.     Comments: Soft, non-tender  Musculoskeletal:        General: Normal range of motion.     Cervical back: Normal range of motion.  Skin:    General: Skin is warm and dry.     Comments: No bodily rash noted  Neurological:     Mental Status: He is alert and oriented to person, place, and time.     ED Results / Procedures / Treatments   Labs (all labs ordered are listed, but only  abnormal results are displayed) Labs Reviewed  CBC WITH DIFFERENTIAL/PLATELET - Abnormal; Notable for the following components:      Result Value   RBC 4.18 (*)    Monocytes Absolute 1.1 (*)    Abs Immature Granulocytes 0.23 (*)    All other components within normal limits  RESP PANEL BY RT-PCR (FLU A&B, COVID) ARPGX2  COMPREHENSIVE METABOLIC PANEL    EKG None  Radiology No results found.  Procedures Procedures    Medications Ordered in ED Medications  acetaminophen (TYLENOL) tablet 1,000 mg (1,000 mg Oral Given 11/24/21 7782)    ED Course/ Medical Decision Making/ A&P                           Medical Decision Making Amount and/or Complexity of Data Reviewed Labs: ordered. ECG/medicine tests: ordered and independent interpretation performed.  Risk OTC drugs.   61 year old male presenting to the ED with 2 days of "feeling unwell".  Reports he was treated for RMSF last month, completed full course of doxycycline.  States now he feels generally weak, headache, chills, and sore throat.  He denies any sick contacts.  No abdominal pain, vomiting, or diarrhea.  Afebrile, nontoxic in appearance.  He has no visible rash.  Abdomen is soft and overall nontender.  Vitals are stable on room air.  Labs obtained-- no leukocytosis, no electrolyte derangement.  COVID/flu screen is negative.  He remains hemodynamically stable.  Do not feel he requires further emergent work-up at this time.  May represent viral process.  It does appear that he may have had RMSF a few weeks ago based on photos and PE findings from that visit.   However, he did fully complete treatment.  He does not have any rash, diffuse joint pain, thrombocytopenia, or transaminitis to suggest acute complications from tick borne illness.  Feel he is stable for discharge home.  Recommended symptomatic care and close follow-up with PCP.  Return here for new concerns.  Final Clinical Impression(s) / ED Diagnoses Final  diagnoses:  Feeling unwell    Rx / DC Orders ED Discharge Orders     None         Garlon Hatchet, PA-C 11/24/21 4235    Sabas Sous, MD 11/24/21 [phone]

## 2021-11-24 NOTE — ED Triage Notes (Signed)
Pt reports weakness, headache, sore through, and chills

## 2021-11-24 NOTE — Discharge Instructions (Signed)
Your labs today were normal.  Covid/flu test was negative. Make sure to rest, drink fluids. Follow-up with your primary care doctor. Return here for new concerns.

## 2021-11-28 ENCOUNTER — Emergency Department (HOSPITAL_COMMUNITY)
Admission: EM | Admit: 2021-11-28 | Discharge: 2021-12-01 | Disposition: A | Discharge status: Home / Self Care | Payer: Medicaid Other | Attending: Emergency Medicine | Admitting: Emergency Medicine

## 2021-11-28 ENCOUNTER — Encounter (HOSPITAL_COMMUNITY): Payer: Self-pay

## 2021-11-28 DIAGNOSIS — F1994 Other psychoactive substance use, unspecified with psychoactive substance-induced mood disorder: Secondary | ICD-10-CM | POA: Diagnosis present

## 2021-11-28 DIAGNOSIS — J449 Chronic obstructive pulmonary disease, unspecified: Secondary | ICD-10-CM | POA: Insufficient documentation

## 2021-11-28 DIAGNOSIS — Z20822 Contact with and (suspected) exposure to covid-19: Secondary | ICD-10-CM | POA: Diagnosis not present

## 2021-11-28 DIAGNOSIS — E871 Hypo-osmolality and hyponatremia: Secondary | ICD-10-CM | POA: Diagnosis not present

## 2021-11-28 DIAGNOSIS — R45851 Suicidal ideations: Secondary | ICD-10-CM | POA: Insufficient documentation

## 2021-11-28 DIAGNOSIS — F331 Major depressive disorder, recurrent, moderate: Secondary | ICD-10-CM | POA: Diagnosis present

## 2021-11-28 DIAGNOSIS — I10 Essential (primary) hypertension: Secondary | ICD-10-CM | POA: Diagnosis not present

## 2021-11-28 DIAGNOSIS — Z79899 Other long term (current) drug therapy: Secondary | ICD-10-CM | POA: Insufficient documentation

## 2021-11-28 DIAGNOSIS — F1914 Other psychoactive substance abuse with psychoactive substance-induced mood disorder: Secondary | ICD-10-CM | POA: Diagnosis not present

## 2021-11-28 DIAGNOSIS — F1090 Alcohol use, unspecified, uncomplicated: Secondary | ICD-10-CM | POA: Diagnosis not present

## 2021-11-28 DIAGNOSIS — F1010 Alcohol abuse, uncomplicated: Secondary | ICD-10-CM | POA: Diagnosis present

## 2021-11-28 DIAGNOSIS — Y900 Blood alcohol level of less than 20 mg/100 ml: Secondary | ICD-10-CM | POA: Insufficient documentation

## 2021-11-28 DIAGNOSIS — Z046 Encounter for general psychiatric examination, requested by authority: Secondary | ICD-10-CM | POA: Diagnosis present

## 2021-11-28 DIAGNOSIS — F332 Major depressive disorder, recurrent severe without psychotic features: Secondary | ICD-10-CM | POA: Insufficient documentation

## 2021-11-28 HISTORY — DX: Suicidal ideations: R45.851

## 2021-11-28 HISTORY — DX: Anxiety disorder, unspecified: F41.9

## 2021-11-28 LAB — RESP PANEL BY RT-PCR (FLU A&B, COVID) ARPGX2
Influenza A by PCR: NEGATIVE
Influenza B by PCR: NEGATIVE
SARS Coronavirus 2 by RT PCR: NEGATIVE

## 2021-11-28 LAB — COMPREHENSIVE METABOLIC PANEL
ALT: 26 U/L (ref 0–44)
AST: 20 U/L (ref 15–41)
Albumin: 4.5 g/dL (ref 3.5–5.0)
Alkaline Phosphatase: 74 U/L (ref 38–126)
Anion gap: 8 (ref 5–15)
BUN: 13 mg/dL (ref 8–23)
CO2: 23 mmol/L (ref 22–32)
Calcium: 9.2 mg/dL (ref 8.9–10.3)
Chloride: 96 mmol/L — ABNORMAL LOW (ref 98–111)
Creatinine, Ser: 0.75 mg/dL (ref 0.61–1.24)
GFR, Estimated: >60 mL/min (ref >60)
Glucose, Bld: 113 mg/dL — ABNORMAL HIGH (ref 70–99)
Potassium: 3.6 mmol/L (ref 3.5–5.1)
Sodium: 127 mmol/L — ABNORMAL LOW (ref 135–145)
Total Bilirubin: 0.8 mg/dL (ref 0.3–1.2)
Total Protein: 7.2 g/dL (ref 6.5–8.1)

## 2021-11-28 LAB — CBC
HCT: 40.1 % (ref 39.0–52.0)
Hemoglobin: 14.5 g/dL (ref 13.0–17.0)
MCH: 33.6 pg (ref 26.0–34.0)
MCHC: 36.2 g/dL — ABNORMAL HIGH (ref 30.0–36.0)
MCV: 92.8 fL (ref 80.0–100.0)
Platelets: 220 10*3/uL (ref 150–400)
RBC: 4.32 MIL/uL (ref 4.22–5.81)
RDW: 13.2 % (ref 11.5–15.5)
WBC: 7.1 10*3/uL (ref 4.0–10.5)
nRBC: 0 % (ref 0.0–0.2)

## 2021-11-28 LAB — RAPID URINE DRUG SCREEN, HOSP PERFORMED
Amphetamines: NOT DETECTED
Barbiturates: NOT DETECTED
Benzodiazepines: NOT DETECTED
Cocaine: NOT DETECTED
Opiates: NOT DETECTED
Tetrahydrocannabinol: NOT DETECTED

## 2021-11-28 LAB — ACETAMINOPHEN LEVEL: Acetaminophen (Tylenol), Serum: 18 ug/mL (ref 10–30)

## 2021-11-28 LAB — ETHANOL: Alcohol, Ethyl (B): <10 mg/dL (ref <10)

## 2021-11-28 LAB — SALICYLATE LEVEL: Salicylate Lvl: <7 mg/dL — ABNORMAL LOW (ref 7.0–30.0)

## 2021-11-28 MED ORDER — LISINOPRIL-HYDROCHLOROTHIAZIDE 10-12.5 MG PO TABS: 1.0000 | ORAL_TABLET | Freq: Every day | ORAL | Status: DC

## 2021-11-28 MED ORDER — LISINOPRIL 10 MG PO TABS
10.0000 mg | ORAL_TABLET | Freq: Every day | ORAL | Status: DC
  Administered 2021-11-28 – 2021-12-01 (×4): 10 mg via ORAL
  Filled 2021-11-28 (×4): qty 1

## 2021-11-28 MED ORDER — GABAPENTIN 300 MG PO CAPS
300.0000 mg | ORAL_CAPSULE | Freq: Three times a day (TID) | ORAL | Status: DC
  Administered 2021-11-28 – 2021-12-01 (×9): 300 mg via ORAL
  Filled 2021-11-28 (×9): qty 1

## 2021-11-28 MED ORDER — QUETIAPINE FUMARATE 50 MG PO TABS: 50.0000 mg | ORAL_TABLET | Freq: Every day | ORAL | Status: DC

## 2021-11-28 MED ORDER — PANTOPRAZOLE SODIUM 40 MG PO TBEC
40.0000 mg | DELAYED_RELEASE_TABLET | Freq: Every day | ORAL | Status: DC
  Administered 2021-11-28 – 2021-12-01 (×4): 40 mg via ORAL
  Filled 2021-11-28 (×4): qty 1

## 2021-11-28 MED ORDER — SUCRALFATE 1 G PO TABS
1.0000 g | ORAL_TABLET | Freq: Three times a day (TID) | ORAL | Status: DC
  Administered 2021-11-28 – 2021-12-01 (×10): 1 g via ORAL
  Filled 2021-11-28 (×10): qty 1

## 2021-11-28 MED ORDER — QUETIAPINE FUMARATE 100 MG PO TABS
100.0000 mg | ORAL_TABLET | Freq: Every day | ORAL | Status: DC
  Administered 2021-11-28 – 2021-11-30 (×3): 100 mg via ORAL
  Filled 2021-11-28 (×3): qty 1

## 2021-11-28 MED ORDER — HYDROXYZINE HCL 25 MG PO TABS
25.0000 mg | ORAL_TABLET | Freq: Once | ORAL | Status: AC
  Administered 2021-11-28: 25 mg via ORAL
  Filled 2021-11-28: qty 1

## 2021-11-28 MED ORDER — SODIUM CHLORIDE 0.9 % IV BOLUS
1000.0000 mL | Freq: Once | INTRAVENOUS | Status: AC
  Administered 2021-11-28: 1000 mL via INTRAVENOUS

## 2021-11-28 MED ORDER — HYDROCHLOROTHIAZIDE 12.5 MG PO TABS
12.5000 mg | ORAL_TABLET | Freq: Every day | ORAL | Status: DC
  Administered 2021-11-28 – 2021-12-01 (×4): 12.5 mg via ORAL
  Filled 2021-11-28 (×4): qty 1

## 2021-11-28 NOTE — ED Notes (Signed)
Patient is using the phone at the nurses desk. Patient is able to self ambulate. Is alert and oriented x4.

## 2021-11-28 NOTE — Consult Note (Signed)
Telepsych Consultation   Reason for Consult:  psych consult Referring Physician:  Theron Arista, PA-C Location of Patient:  [facility] Location of Provider: [facility]al [facility] TTS Department  Patient Identification: Zachary [name] MRN:  [id number] Principal Diagnosis: MDD (major depressive disorder), recurrent severe, without psychosis (HCC) Diagnosis:  Principal Problem:   MDD (major depressive disorder), recurrent severe, without psychosis (HCC) Active Problems:   Alcohol use disorder   Suicidal thoughts   Substance induced mood disorder (HCC)   Total Time spent with patient: 20 minutes  Subjective:   Zachary [name] is a 61 y.o. male patient admitted with depression and suicidal ideations. Patient presents alert and oriented, calm and cooperative. Reports having increased feelings of hopelessness, depression, and suicidal thoughts. Reports increased psychosocial stressors after recently being diagnosed with [facility] Spotted Fever (10/26/21) weeks ago and hasn't been working. Says since illness he's been declining mentally and physically. Increased anxiety and depression. Lives alone and says home is currently in disrepair, food stamps have expired, and he was attempting to supplement income with odd jobs however unable to work since illness in August resulting with inability to pay bills. Last drink was 6 pack yesterday, first time drinking since June. He reports insomnia and poor appetite; last meal last Friday. States issues since provider transitioned him from Clonazepam to Cymbalta. Says woke up this morning with suicidal thoughts and anxiety about his current situation. Continues to endorse physical ailments and "feeling bad" diarrhea, stomach issues. Expressed concern of acting on thoughts; unable to contract for safety.   HPI:  Zachary [name] is a 61 year old male patient with past psychiatric history alcohol abuse, severe dependence, MDD recurrent severe without psychosis,  and suicide attempt via self inflicted injury by cutting and piercing instrument who presented to [facility] via EMS for malaise and suicidal ideations. UDS-, BAL<10. PDMP reviewed, no active prescription in past 60 days.   Past Psychiatric History: alcohol abuse, severe dependence, MDD recurrent severe without psychosis,  Risk to Self:   Risk to Others:   Prior Inpatient Therapy:   Prior Outpatient Therapy:    Past Medical History:  Past Medical History:  Diagnosis Date   Alcohol abuse    Anxiety    COPD (chronic obstructive pulmonary disease) (HCC)    Depression    Hypertension    Suicidal ideation     Past Surgical History:  Procedure Laterality Date   COLONOSCOPY     MOUTH SURGERY     NO PAST SURGERIES     Family History:  Family History  Problem Relation Age of Onset   Heart attack Paternal Grandfather    Stroke Mother    Heart failure Mother    Heart failure Father    Family Psychiatric  History: mother, brother, father: alcohol abuse Social History:  Social History   Substance and Sexual Activity  Alcohol Use Yes   Comment: BAC was .23 on admission     Social History   Substance and Sexual Activity  Drug Use Yes   Types: Marijuana   Comment: UDS was negative    Social History   Socioeconomic History   Marital status: Divorced    Spouse name: Not on file   Number of children: 2   Years of education: Not on file   Highest education level: Not on file  Occupational History   Occupation: Product/process development scientist  Tobacco Use   Smoking status: Some Days    Packs/day: 2.00    Types: Cigarettes  Smokeless tobacco: Never  Vaping Use   Vaping Use: Not on file  Substance and Sexual Activity   Alcohol use: Yes    Comment: BAC was .23 on admission   Drug use: Yes    Types: Marijuana    Comment: UDS was negative   Sexual activity: Not Currently  Other Topics Concern   Not on file  Social History Narrative   Pt stated that he lives alone; works as a Scientist, research (physical sciences)general  contractor   Social Determinants of Corporate investment bankerHealth   Financial Resource Strain: Not on file  Food Insecurity: Not on file  Transportation Needs: Not on file  Physical Activity: Not on file  Stress: Not on file  Social Connections: Not on file   Additional Social History:    Allergies:   Allergies  Allergen Reactions   Penicillins Other (See Comments)    Childhood allergy Reaction:  Unknown  Has patient had a PCN reaction causing immediate rash, facial/tongue/throat swelling, SOB or lightheadedness with hypotension: Unknown Has patient had a PCN reaction causing severe rash involving mucus membranes or skin necrosis: Unknown Has patient had a PCN reaction that required hospitalization Unknown Has patient had a PCN reaction occurring within the last 10 years: No If all of the above answers are "NO", then may proceed with Cephalosporin use.   Statins Palpitations    Labs:  Results for orders placed or performed during the hospital encounter of 11/28/21 (from the past 48 hour(s))  Comprehensive metabolic panel     Status: Abnormal   Collection Time: 11/28/21  8:52 AM  Result Value Ref Range   Sodium 127 (L) 135 - 145 mmol/L   Potassium 3.6 3.5 - 5.1 mmol/L   Chloride 96 (L) 98 - 111 mmol/L   CO2 23 22 - 32 mmol/L   Glucose, Bld 113 (H) 70 - 99 mg/dL    Comment: Glucose reference range applies only to samples taken after fasting for at least 8 hours.   BUN 13 8 - 23 mg/dL   Creatinine, Ser 1.610.75 0.61 - 1.24 mg/dL   Calcium 9.2 8.9 - 09.610.3 mg/dL   Total Protein 7.2 6.5 - 8.1 g/dL   Albumin 4.5 3.5 - 5.0 g/dL   AST 20 15 - 41 U/L   ALT 26 0 - 44 U/L   Alkaline Phosphatase 74 38 - 126 U/L   Total Bilirubin 0.8 0.3 - 1.2 mg/dL   GFR, Estimated >04>60 >54>60 mL/min    Comment: (NOTE) Calculated using the CKD-EPI Creatinine Equation (2021)    Anion gap 8 5 - 15    Comment: Performed at [facility], [address] [place], [place] [address]  Ethanol     Status: None    Collection Time: 11/28/21  8:52 AM  Result Value Ref Range   Alcohol, Ethyl (B) <10 <10 mg/dL    Comment: (NOTE) Lowest detectable limit for serum alcohol is 10 mg/dL.  For medical purposes only. Performed at [facility], [address] [place], [place] [address]   Salicylate level     Status: Abnormal   Collection Time: 11/28/21  8:52 AM  Result Value Ref Range   Salicylate Lvl <7.0 (L) 7.0 - 30.0 mg/dL    Comment: Performed at [facility], [address] [place], [place] [address]  Acetaminophen level     Status: None   Collection Time: 11/28/21  8:52 AM  Result Value Ref Range   Acetaminophen (Tylenol), Serum 18 10 -  30 ug/mL    Comment: (NOTE) Therapeutic concentrations vary significantly. A range of 10-30 ug/mL  may be an effective concentration for many patients. However, some  are best treated at concentrations outside of this range. Acetaminophen concentrations >150 ug/mL at 4 hours after ingestion  and >50 ug/mL at 12 hours after ingestion are often associated with  toxic reactions.  Performed at [facility], [address] [place], [place] [address]   cbc     Status: Abnormal   Collection Time: 11/28/21  8:52 AM  Result Value Ref Range   WBC 7.1 4.0 - 10.5 K/uL   RBC 4.32 4.22 - 5.81 MIL/uL   Hemoglobin 14.5 13.0 - 17.0 g/dL   HCT 65.7 84.6 - 96.2 %   MCV 92.8 80.0 - 100.0 fL   MCH 33.6 26.0 - 34.0 pg   MCHC 36.2 (H) 30.0 - 36.0 g/dL   RDW 95.2 84.1 - 32.4 %   Platelets 220 150 - 400 K/uL   nRBC 0.0 0.0 - 0.2 %    Comment: Performed at [facility], [address] [place], [place] [address]  Rapid urine drug screen (hospital performed)     Status: None   Collection Time: 11/28/21  9:31 AM  Result Value Ref Range   Opiates NONE DETECTED NONE DETECTED   Cocaine NONE DETECTED NONE DETECTED   Benzodiazepines NONE DETECTED NONE DETECTED   Amphetamines NONE DETECTED NONE DETECTED    Tetrahydrocannabinol NONE DETECTED NONE DETECTED   Barbiturates NONE DETECTED NONE DETECTED    Comment: (NOTE) DRUG SCREEN FOR MEDICAL PURPOSES ONLY.  IF CONFIRMATION IS NEEDED FOR ANY PURPOSE, NOTIFY LAB WITHIN 5 DAYS.  LOWEST DETECTABLE LIMITS FOR URINE DRUG SCREEN Drug Class                     Cutoff (ng/mL) Amphetamine and metabolites    1000 Barbiturate and metabolites    200 Benzodiazepine                 200 Tricyclics and metabolites     300 Opiates and metabolites        300 Cocaine and metabolites        300 THC                            50 Performed at [facility], [address], [place], [place] [address]   Resp Panel by RT-PCR (Flu A&B, Covid) Anterior Nasal Swab     Status: None   Collection Time: 11/28/21 10:38 AM   Specimen: Anterior Nasal Swab  Result Value Ref Range   SARS Coronavirus 2 by RT PCR NEGATIVE NEGATIVE    Comment: (NOTE) SARS-CoV-2 target nucleic acids are NOT DETECTED.  The SARS-CoV-2 RNA is generally detectable in upper respiratory specimens during the acute phase of infection. The lowest concentration of SARS-CoV-2 viral copies this assay can detect is 138 copies/mL. A negative result does not preclude SARS-Cov-2 infection and should not be used as the sole basis for treatment or other patient management decisions. A negative result may occur with  improper specimen collection/handling, submission of specimen other than nasopharyngeal swab, presence of viral mutation(s) within the areas targeted by this assay, and inadequate number of viral copies(<138 copies/mL). A negative result must be combined with clinical observations, patient history, and epidemiological information. The expected result is Negative.  Fact Sheet for Patients:  BloggerCourse.com  Fact Sheet for Healthcare Providers:  SeriousBroker.it  This test is no t yet approved or cleared by the [place] FDA and  has been authorized for detection and/or diagnosis of SARS-CoV-2 by FDA under an Emergency Use Authorization (EUA). This EUA will remain  in effect (meaning this test can be used) for the duration of the COVID-19 declaration under Section 564(b)(1) of the Act, 21 U.S.C.section 360bbb-3(b)(1), unless the authorization is terminated  or revoked sooner.       Influenza A by PCR NEGATIVE NEGATIVE   Influenza B by PCR NEGATIVE NEGATIVE    Comment: (NOTE) The Xpert Xpress SARS-CoV-2/FLU/RSV plus assay is intended as an aid in the diagnosis of influenza from Nasopharyngeal swab specimens and should not be used as a sole basis for treatment. Nasal washings and aspirates are unacceptable for Xpert Xpress SARS-CoV-2/FLU/RSV testing.  Fact Sheet for Patients: BloggerCourse.com  Fact Sheet for Healthcare Providers: SeriousBroker.it  This test is not yet approved or cleared by the [place] FDA and has been authorized for detection and/or diagnosis of SARS-CoV-2 by FDA under an Emergency Use Authorization (EUA). This EUA will remain in effect (meaning this test can be used) for the duration of the COVID-19 declaration under Section 564(b)(1) of the Act, 21 U.S.C. section 360bbb-3(b)(1), unless the authorization is terminated or revoked.  Performed at [facility], [address], [place], [place] [address]     Medications:  Current Facility-Administered Medications  Medication Dose Route Frequency Provider Last Rate Last Admin   gabapentin (NEURONTIN) capsule 300 mg  300 mg Oral TID Leevy-Johnson, Mekai Wilkinson A, NP   300 mg at 11/28/21 1619   lisinopril (ZESTRIL) tablet 10 mg  10 mg Oral Daily Leevy-Johnson, Syrus Nakama A, NP   10 mg at 11/28/21 1815   And   hydrochlorothiazide (HYDRODIURIL) tablet 12.5 mg  12.5 mg Oral Daily Leevy-Johnson, Areeba Sulser A, NP   12.5 mg at 11/28/21 1815   pantoprazole (PROTONIX) EC  tablet 40 mg  40 mg Oral Daily Leevy-Johnson, Song Garris A, NP   40 mg at 11/28/21 1619   QUEtiapine (SEROQUEL) tablet 100 mg  100 mg Oral QHS Leevy-Johnson, Izell Labat A, NP       sucralfate (CARAFATE) tablet 1 g  1 g Oral TID WC & HS Leevy-Johnson, Narda Fundora A, NP   1 g at 11/28/21 1619   Current Outpatient Medications  Medication Sig Dispense Refill   acetaminophen (TYLENOL) 500 MG tablet Take 1,000 mg by mouth every 6 (six) hours as needed for mild pain.     DULoxetine (CYMBALTA) 30 MG capsule Take 90 mg by mouth daily.     gabapentin (NEURONTIN) 300 MG capsule Take 1 capsule (300 mg total) by mouth 3 (three) times daily. 90 capsule 0   lisinopril-hydrochlorothiazide (ZESTORETIC) 10-12.5 MG tablet Take 1 tablet by mouth daily.     naltrexone (DEPADE) 50 MG tablet Take 50 mg by mouth daily.     QUEtiapine (SEROQUEL) 100 MG tablet Take 50-200 mg by mouth at bedtime.     cloNIDine (CATAPRES) 0.1 MG tablet Take 0.1 mg by mouth daily. (Patient not taking: Reported on 10/26/2021)     EPINEPHrine 0.3 mg/0.3 mL IJ SOAJ injection Inject 0.3 mg into the muscle as needed for anaphylaxis. (Patient not taking: Reported on 11/28/2021) 1 each 0   omeprazole (PRILOSEC) 20 MG capsule Take 1 capsule (20 mg total) by mouth daily. (Patient not taking: Reported on 11/28/2021) 30 capsule 0   sucralfate (CARAFATE) 1 g  tablet Take 1 tablet (1 g total) by mouth 4 (four) times daily -  with meals and at bedtime. (Patient not taking: Reported on 11/28/2021) 60 tablet 0   Musculoskeletal: Strength & Muscle Tone: within normal limits Gait & Station: normal Patient leans: N/A  Psychiatric Specialty Exam:  Presentation  General Appearance: Appropriate for Environment; Casual  Eye Contact:Good  Speech:Clear and Coherent; Normal Rate  Speech Volume:Normal  Handedness:Right   Mood and Affect  Mood:Depressed; Dysphoric; Hopeless  Affect:Congruent; Depressed   Thought Process  Thought Processes:Coherent; Goal  Directed  Descriptions of Associations:Intact  Orientation:Full (Time, Place and Person)  Thought Content:Logical  History of Schizophrenia/Schizoaffective disorder:No  Duration of Psychotic Symptoms:No data recorded Hallucinations:Hallucinations: None  Ideas of Reference:None  Suicidal Thoughts:Suicidal Thoughts: Yes, Active  Homicidal Thoughts:Homicidal Thoughts: No   Sensorium  Memory:Immediate Fair; Recent Fair; Remote Fair  Judgment:Fair  Insight:Fair  Executive Functions  Concentration:Fair  Attention Span:Fair  [place]  Psychomotor Activity  Psychomotor Activity:Psychomotor Activity: Normal   Assets  Assets:Resilience; Desire for Improvement; Social Support  Sleep  Sleep:Sleep: Poor   Physical Exam: Physical Exam Vitals and nursing note reviewed.  Constitutional:      Appearance: He is normal weight.  HENT:     Head: Normocephalic.     Nose: Nose normal.     Mouth/Throat:     Mouth: Mucous membranes are moist.     Pharynx: Oropharynx is clear.  Eyes:     Pupils: Pupils are equal, round, and reactive to light.  Cardiovascular:     Rate and Rhythm: Normal rate.     Pulses: Normal pulses.  Pulmonary:     Effort: Pulmonary effort is normal.  Abdominal:     Palpations: Abdomen is soft.  Musculoskeletal:        General: Normal range of motion.     Cervical back: Normal range of motion.  Skin:    General: Skin is warm and dry.  Neurological:     Mental Status: He is alert and oriented to person, place, and time.  Psychiatric:        Attention and Perception: He does not perceive auditory or visual hallucinations.        Mood and Affect: Mood is depressed. Affect is blunt.        Speech: Speech normal.        Behavior: Behavior is withdrawn. Behavior is cooperative.        Thought Content: Thought content includes suicidal ideation.        Cognition and Memory: Cognition and memory normal.         Judgment: Judgment normal.    Review of Systems  Constitutional:  Positive for chills.  Gastrointestinal:  Positive for abdominal pain, diarrhea, nausea and vomiting.  Psychiatric/Behavioral:  Positive for depression, substance abuse and suicidal ideas.   All other systems reviewed and are negative.  Blood pressure 127/71, pulse 77, temperature 98.2 F (36.8 C), resp. rate 16, height 5\' 9"  (1.753 m), weight 63.5 kg, SpO2 99 %. Body mass index is 20.67 kg/m.  Treatment Plan Summary: Daily contact with patient to assess and evaluate symptoms and progress in treatment, Medication management, and Plan home medications restarted and seek inpatient psychiatric hospitalization.   Disposition: Supportive therapy provided about ongoing stressors. Discussed crisis plan, support from social network, calling 911, coming to the Emergency Department, and calling Suicide Hotline.  This service was provided via telemedicine using a 2-way, interactive audio and video  technology.  Names of all persons participating in this telemedicine service and their role in this encounter. Name: Maxie Barb Role: PMHNP  Name: Phineas Inches Role: Attending MD  Name: Zachary [name] Role: patient  Name:  Role:     Loletta Parish, NP 11/28/2021 7:23 PM

## 2021-11-28 NOTE — ED Provider Notes (Signed)
[facility]EMERGENCY DEPT Provider Note   CSN: [id number] Arrival date & time: 11/28/21  2951     History  Chief Complaint  Patient presents with   Suicidal    Zachary [name] is a 61 y.o. male.  HPI   Patient with medical history of COPD, hypertension, alcohol abuse, anxiety presents today due to suicidal ideation.  States he has been feeling depressed since getting a case of [facility] fever a few months ago.  He is up taking all of his medicine except for Cymbalta which she started 2 days ago.  States he had a previous attempt at killing himself by slashing his wrist in May of this year, denies any current plan.  States he drinks a sixpack of alcohol last night which is the first time sometime, denies any illicit drug use.  No HI, paranoia, delusions, hallucinations.  Home Medications Prior to Admission medications   Medication Sig Start Date End Date Taking? Authorizing Provider  cloNIDine (CATAPRES) 0.1 MG tablet Take 0.1 mg by mouth daily. Patient not taking: Reported on 10/26/2021 10/05/21   [provider]  DULoxetine (CYMBALTA) 30 MG capsule Take 90 mg by mouth daily. 10/05/21   [provider]  DULoxetine (CYMBALTA) 60 MG capsule Take 1 capsule (60 mg total) by mouth daily. Patient not taking: Reported on 10/26/2021 09/10/21   Estella Husk, MD  EPINEPHrine 0.3 mg/0.3 mL IJ SOAJ injection Inject 0.3 mg into the muscle as needed for anaphylaxis. 10/31/21   Derwood Kaplan, MD  gabapentin (NEURONTIN) 300 MG capsule Take 1 capsule (300 mg total) by mouth 3 (three) times daily. Patient not taking: Reported on 10/26/2021 09/09/21 10/09/21  Estella Husk, MD  lisinopril-hydrochlorothiazide (ZESTORETIC) 10-12.5 MG tablet Take 1 tablet by mouth daily.    [provider]  naltrexone (DEPADE) 50 MG tablet Take 50 mg by mouth daily. Patient not taking: Reported on 10/26/2021 10/14/21   [provider]  omeprazole  (PRILOSEC) 20 MG capsule Take 1 capsule (20 mg total) by mouth daily. 10/31/21   Al Decant, PA-C  QUEtiapine (SEROQUEL) 100 MG tablet Take 50-200 mg by mouth at bedtime. 10/14/21   [provider]  sucralfate (CARAFATE) 1 g tablet Take 1 tablet (1 g total) by mouth 4 (four) times daily -  with meals and at bedtime. 10/31/21   Al Decant, PA-C      Allergies    Penicillins and Statins    Review of Systems   Review of Systems  Physical Exam Updated Vital Signs BP 127/71 (BP Location: Left Arm)   Pulse 81   Temp 97.6 F (36.4 C) (Oral)   Resp 11   Ht 5\' 9"  (1.753 m)   Wt 63.5 kg   SpO2 99%   BMI 20.67 kg/m  Physical Exam Vitals and nursing note reviewed. Exam conducted with a chaperone present.  Constitutional:      Appearance: Normal appearance.  HENT:     Head: Normocephalic and atraumatic.  Eyes:     General: No scleral icterus.       Right eye: No discharge.        Left eye: No discharge.     Extraocular Movements: Extraocular movements intact.     Pupils: Pupils are equal, round, and reactive to light.  Cardiovascular:     Rate and Rhythm: Normal rate and regular rhythm.     Pulses: Normal pulses.     Heart sounds: Normal heart sounds. No  murmur heard.    No friction rub. No gallop.  Pulmonary:     Effort: Pulmonary effort is normal. No respiratory distress.     Breath sounds: Normal breath sounds.  Abdominal:     General: Abdomen is flat. Bowel sounds are normal. There is no distension.     Palpations: Abdomen is soft.     Tenderness: There is no abdominal tenderness.  Skin:    General: Skin is warm and dry.     Coloration: Skin is not jaundiced.     Comments: Healing scar left anterior forearm  Neurological:     Mental Status: He is alert. Mental status is at baseline.     Coordination: Coordination normal.  Psychiatric:        Attention and Perception: Attention normal.        Mood and Affect: Affect is blunt.        Behavior:  Behavior is withdrawn. Behavior is cooperative.        Thought Content: Thought content includes suicidal ideation. Thought content does not include suicidal plan.     ED Results / Procedures / Treatments   Labs (all labs ordered are listed, but only abnormal results are displayed) Labs Reviewed  COMPREHENSIVE METABOLIC PANEL - Abnormal; Notable for the following components:      Result Value   Sodium 127 (*)    Chloride 96 (*)    Glucose, Bld 113 (*)    All other components within normal limits  SALICYLATE LEVEL - Abnormal; Notable for the following components:   Salicylate Lvl <7.0 (*)    All other components within normal limits  CBC - Abnormal; Notable for the following components:   MCHC 36.2 (*)    All other components within normal limits  RESP PANEL BY RT-PCR (FLU A&B, COVID) ARPGX2  ETHANOL  ACETAMINOPHEN LEVEL  RAPID URINE DRUG SCREEN, HOSP PERFORMED    EKG EKG Interpretation  Date/Time:  Sunday November 28 2021 09:16:44 EDT Ventricular Rate:  69 PR Interval:  128 QRS Duration: 107 QT Interval:  402 QTC Calculation: 431 R Axis:   -37 Text Interpretation: Sinus rhythm Incomplete RBBB and LAFB ST elevation suggests acute pericarditis No significant change since prior 6/23 Confirmed by Meridee Score [phone]) on 11/28/2021 9:58:09 AM  Radiology No results found.  Procedures Procedures    Medications Ordered in ED Medications  sodium chloride 0.9 % bolus 1,000 mL (has no administration in time range)    ED Course/ Medical Decision Making/ A&P                           Medical Decision Making Amount and/or Complexity of Data Reviewed Labs: ordered.   Patient presents due to suicidal ideations.  He is here voluntarily, he is contracted to safety.  Do not think any need for IVC.  His physical exam is benign. -BP 127/71 (BP Location: Left Arm)   Pulse 81   Temp 97.6 F (36.4 C) (Oral)   Resp 11   Ht 5\' 9"  (1.753 m)   Wt 63.5 kg   SpO2 99%   BMI  20.67 kg/m   Reviewed patient's home medication list.  Also reviewed external records, patient has a history of longstanding alcohol use disorder.  He also endorses not eating or drinking much over the past 2 weeks other than alcohol last night.  I ordered, viewed and interpreted laboratory work-up. -CBC without leukocytosis.  No anemia -CMP shows slight  hyponatremia of 127, no AKI or transaminitis.  -Salicylate levels and ethanol level unremarkable.   -Acetaminophen level is 18, UDS is negative.  Suspect hyponatremia secondary to psychogenic hyponatremia since patient has not been eating or drinking water.  Also be artificially low secondary to only consuming beer.  We will give a liter bolus.  Clinically I feel patient is medically cleared and appropriate for TTS evaluation.  All meds not reviewed by pharmacy but once completed I will reorder.         Final Clinical Impression(s) / ED Diagnoses Final diagnoses:  None    Rx / DC Orders ED Discharge Orders     None         Sherrill Raring, Hershal Coria 11/28/21 1022    Hayden Rasmussen, MD 11/28/21 1815

## 2021-11-28 NOTE — ED Notes (Signed)
TTS in process 

## 2021-11-28 NOTE — ED Triage Notes (Signed)
Pt arrives via [facility] for c/o suicidal ideation. Pt denies active plan. Pt states that he suffers from severe anxiety and recently had his clonazepam changed to cymbalta and has been unable to cope with the change. Pt denies HI, no AVH.

## 2021-11-28 NOTE — ED Notes (Signed)
Pt is voluntary at this time

## 2021-11-28 NOTE — ED Notes (Signed)
Pt calm and cooperative, currently resting on stretcher.

## 2021-11-29 MED ORDER — HYDROXYZINE HCL 25 MG PO TABS
50.0000 mg | ORAL_TABLET | Freq: Once | ORAL | Status: AC
  Administered 2021-11-29: 50 mg via ORAL
  Filled 2021-11-29: qty 2

## 2021-11-29 NOTE — ED Provider Notes (Signed)
Emergency Medicine Observation Re-evaluation Note  Zachary [name] is a 61 y.o. male, seen on rounds today.  Pt initially presented to the ED for complaints of Suicidal Currently, the patient is resting.  Physical Exam  BP 102/65   Pulse (!) 55   Temp 98.1 F (36.7 C) (Oral)   Resp 16   Ht 5\' 9"  (1.753 m)   Wt 63.5 kg   SpO2 94%   BMI 20.67 kg/m  Physical Exam General: NAD Cardiac: well perfused Lungs: even and unlabored Psych: No agitation  ED Course / MDM  EKG:EKG Interpretation  Date/Time:  Sunday November 28 2021 09:16:44 EDT Ventricular Rate:  69 PR Interval:  128 QRS Duration: 107 QT Interval:  402 QTC Calculation: 431 R Axis:   -37 Text Interpretation: Sinus rhythm Incomplete RBBB and LAFB ST elevation suggests acute pericarditis No significant change since prior 6/23 Confirmed by Aletta Edouard [phone]) on 11/28/2021 9:58:09 AM  I have reviewed the labs performed to date as well as medications administered while in observation.  Recent changes in the last 24 hours include inpatient management.  Plan  Current plan is for inpatient psychiatric hospitalization.    Regan Lemming, MD 11/29/21 [phone]

## 2021-11-29 NOTE — ED Notes (Signed)
Patient is currently asleep. °

## 2021-11-30 MED ORDER — HYDROXYZINE HCL 25 MG PO TABS
50.0000 mg | ORAL_TABLET | Freq: Once | ORAL | Status: AC
  Administered 2021-11-30: 50 mg via ORAL
  Filled 2021-11-30: qty 2

## 2021-11-30 NOTE — Progress Notes (Signed)
Inpatient Behavioral Health Placement  Pt continues to meets inpatient criteria. There are no available beds at [facility] per [facility]. Referral was sent to the following facilities;   Destination Service Provider Address Phone Fax  [facility]  [address] [place] [address] [phone]  [place]  [address], [place] [address] [phone]  [facility]  [place], [place] [address] [facility]  [address] [place] [address] [phone]  [facility]  [place], [place] [address] [phone]  [facility]  [place] [place] [address] [place]  [facility]  [address] [place] [address] [phone]  [place] [place] [address] [phone]  [facility]  [address] [place] [address] [phone]  [facility]  [address] [place] [address] [phone] [facility]  [address], [place] [address] [phone]  [facility]  [address], [place] [address] [phone]  [facility]  [address] [place] [address] [place]  [facility]  [address] [place] [address] [phone]    Situation ongoing,  CSW will follow up.   Benjaman Kindler, MSW, LCSWA 11/30/2021  @ 3:53 AM;

## 2021-11-30 NOTE — Progress Notes (Addendum)
Pt was accepted to [facility] 11/30/21 after 6:00pm; BED [facility]  Pt meets inpatient criteria per Zachary Merlin, NP  Attending Physician will be  Report can be called to: [phone]  Pt can arrive after 6:00pm  Care Team notified: Laurena Spies, RN, Sheran Fava, [place], [place] 11/30/2021 @ 12:04 PM   He will be going to [facility] and needs to arrive after 6 pm today.  Report number is [phone].  Accepting doctor is Dr. Lucilla Edin, MD.

## 2021-11-30 NOTE — ED Notes (Signed)
Pt was accepted to [facility] 11/30/21 after 6:00pm; BED [facility]  Pt meets inpatient criteria per Inda Merlin, NP  Attending Physician will be  Report can be called to: [phone]  Pt can arrive after 6:00pm

## 2021-11-30 NOTE — ED Provider Notes (Signed)
Emergency Medicine Observation Re-evaluation Note  Zachary [name] is a 61 y.o. male, seen on rounds today.  Pt initially presented to the ED for complaints of Suicidal Currently, the patient is resting.  Physical Exam  BP (!) 96/49   Pulse (!) 57   Temp 98.2 F (36.8 C) (Oral)   Resp 16   Ht 5\' 9"  (1.753 m)   Wt 63.5 kg   SpO2 92%   BMI 20.67 kg/m  Physical Exam General: NAD Cardiac: well perfused Lungs: even and unlabored Psych: No agitation  ED Course / MDM  EKG:EKG Interpretation  Date/Time:  Sunday November 28 2021 09:16:44 EDT Ventricular Rate:  69 PR Interval:  128 QRS Duration: 107 QT Interval:  402 QTC Calculation: 431 R Axis:   -37 Text Interpretation: Sinus rhythm Incomplete RBBB and LAFB ST elevation suggests acute pericarditis No significant change since prior 6/23 Confirmed by Aletta Edouard [phone]) on 11/28/2021 9:58:09 AM  I have reviewed the labs performed to date as well as medications administered while in observation.  Recent changes in the last 24 hours include none.  Plan  Current plan is for inpatient psychiatric hospitalization.      Regan Lemming, MD 11/30/21 [phone]

## 2021-11-30 NOTE — Progress Notes (Signed)
Pt is under review at [facility] per Max. Caryl Pina requested that this CSW provided update clinical notes. CSW sent requested documentation. CSW will assist and follow with placement.   Benjaman Kindler, MSW, [facility] 11/30/2021 10:51 AM

## 2021-11-30 NOTE — Consult Note (Addendum)
[facility] Psych ED Progress Note  11/30/2021 1:17 PM Zachary [name]  MRN:  [id number]   Subjective:    Zachary [name] is a 61 y.o. male patient admitted with depression and suicidal ideations.  Patient seen and reassessed by NP and at the time of this evaluation will recommend discharge with outpatient resources. As noted he has presented with suicidal ideations that are described as fleeting, due to recent diagnosis of [facility] spotted fever.  He reports overall general malaise and fatigue, and that has led to worsening depression symptoms to include fleeting thoughts of suicidal ideations.  He reports they did become more intense, which prompted him to come to the emergency room x 3 days ago.  He states over this 3-day.  He feels much better from a mental standpoint, improved depression secondary to staff, medication, and time to reflect.  He also contributes his improvement to IV hydration and vitamin supplementation to correct his malaise and fatigue.  He states upon discharge he plans to return to his sister's home, where they will reside together for a period of time.  He does provide consent and collateral to talk with his sister.  He has history of 1 previous suicide attempt by cutting of his wrists in May 2023.  He denies any current suicidal plan, suicidal ideation/thoughts, and or self-harm.     Patient would not benefit from inpatient admission at this time. Would recommed outpatient therapy and follow-up with primary care provider.  Patient may also benefit from outpatient IV hydration and vitamin supplementation for ongoing boost, immuno support, and metabolism.  Recommend continuing current medications at discharge, patient can be offered prescription refills for Cymbalta, since he has had a historically positive therapeutic effect.   Attempted to contact sister to obtain collateral information and cooperate discharge plan.  However unsuccessful x 7, phone voicemail unidentified  unable to leave message.  HPI:  Zachary [name] is a 61 year old male patient with past psychiatric history alcohol abuse, severe dependence, MDD recurrent severe without psychosis, and suicide attempt via self inflicted injury by cutting and piercing instrument who presented to [facility] via EMS for malaise and suicidal ideations. UDS-, BAL<10. PDMP reviewed, no active prescription in past 60 days.   Past Psychiatric History: alcohol abuse, severe dependence, MDD recurrent severe without psychosis,  Risk to Self:   Risk to Others:   Prior Inpatient Therapy:   Prior Outpatient Therapy:      Principal Problem: MDD (major depressive disorder), recurrent severe, without psychosis (HCC) Diagnosis:  Principal Problem:   MDD (major depressive disorder), recurrent severe, without psychosis (HCC) Active Problems:   Alcohol use disorder   Suicidal thoughts   Substance induced mood disorder [facility])   ED Assessment Time Calculation: No data recorded  Past Psychiatric History: Alcohol abuse, anxiety, depression, suicidal ideation.  [place] Scale:  Flowsheet Row ED from 11/28/2021 in [facility] from [facility]/31/2021 [facility] from [facility]26/2021 in [facility]  C-SSRS RISK CATEGORY High Risk No Risk No Risk       Past Medical History:  Past Medical History:  Diagnosis Date   Alcohol abuse    Anxiety    COPD (chronic obstructive pulmonary disease) (HCC)    Depression    Hypertension    Suicidal ideation     Past Surgical History:  Procedure Laterality Date   COLONOSCOPY     MOUTH SURGERY     NO PAST  SURGERIES     Family History:  Family History  Problem Relation Age of Onset   Heart attack Paternal Grandfather    Stroke Mother    Heart failure Mother    Heart failure Father    Family Psychiatric  History:  mother, brother, father: alcohol abuse Social History:  Social History    Substance and Sexual Activity  Alcohol Use Yes   Comment: BAC was .23 on admission     Social History   Substance and Sexual Activity  Drug Use Yes   Types: Marijuana   Comment: UDS was negative    Social History   Socioeconomic History   Marital status: Divorced    Spouse name: Not on file   Number of children: 2   Years of education: Not on file   Highest education level: Not on file  Occupational History   Occupation: Product/process development scientist  Tobacco Use   Smoking status: Some Days    Packs/day: 2.00    Types: Cigarettes   Smokeless tobacco: Never  Vaping Use   Vaping Use: Not on file  Substance and Sexual Activity   Alcohol use: Yes    Comment: BAC was .23 on admission   Drug use: Yes    Types: Marijuana    Comment: UDS was negative   Sexual activity: Not Currently  Other Topics Concern   Not on file  Social History Narrative   Pt stated that he lives alone; works as a Sports coach Determinants of Corporate investment banker Strain: Not on Ship broker Insecurity: Not on file  Transportation Needs: Not on file  Physical Activity: Not on file  Stress: Not on file  Social Connections: Not on file    Sleep: Fair  Appetite:  Good  Current Medications: Current Facility-Administered Medications  Medication Dose Route Frequency Provider Last Rate Last Admin   gabapentin (NEURONTIN) capsule 300 mg  300 mg Oral TID Leevy-Johnson, Brooke A, NP   300 mg at 11/30/21 0905   lisinopril (ZESTRIL) tablet 10 mg  10 mg Oral Daily Leevy-Johnson, Brooke A, NP   10 mg at 11/30/21 0223   And   hydrochlorothiazide (HYDRODIURIL) tablet 12.5 mg  12.5 mg Oral Daily Leevy-Johnson, Brooke A, NP   12.5 mg at 11/30/21 0905   pantoprazole (PROTONIX) EC tablet 40 mg  40 mg Oral Daily Leevy-Johnson, Brooke A, NP   40 mg at 11/30/21 0905   QUEtiapine (SEROQUEL) tablet 100 mg  100 mg Oral QHS Leevy-Johnson, Brooke A, NP   100 mg at 11/29/21 2127   sucralfate (CARAFATE)  tablet 1 g  1 g Oral TID WC & HS Leevy-Johnson, Brooke A, NP   1 g at 11/30/21 3612   Current Outpatient Medications  Medication Sig Dispense Refill   acetaminophen (TYLENOL) 500 MG tablet Take 1,000 mg by mouth every 6 (six) hours as needed for mild pain.     DULoxetine (CYMBALTA) 30 MG capsule Take 90 mg by mouth daily.     gabapentin (NEURONTIN) 300 MG capsule Take 1 capsule (300 mg total) by mouth 3 (three) times daily. 90 capsule 0   lisinopril-hydrochlorothiazide (ZESTORETIC) 10-12.5 MG tablet Take 1 tablet by mouth daily.     naltrexone (DEPADE) 50 MG tablet Take 50 mg by mouth daily.     QUEtiapine (SEROQUEL) 100 MG tablet Take 50-200 mg by mouth at bedtime.     cloNIDine (CATAPRES) 0.1 MG tablet Take 0.1 mg by mouth daily. (Patient  not taking: Reported on 10/26/2021)     EPINEPHrine 0.3 mg/0.3 mL IJ SOAJ injection Inject 0.3 mg into the muscle as needed for anaphylaxis. (Patient not taking: Reported on 11/28/2021) 1 each 0   omeprazole (PRILOSEC) 20 MG capsule Take 1 capsule (20 mg total) by mouth daily. (Patient not taking: Reported on 11/28/2021) 30 capsule 0   sucralfate (CARAFATE) 1 g tablet Take 1 tablet (1 g total) by mouth 4 (four) times daily -  with meals and at bedtime. (Patient not taking: Reported on 11/28/2021) 60 tablet 0    Lab Results: No results found for this or any previous visit (from the past 48 hour(s)).  Blood Alcohol level:  Lab Results  Component Value Date   ETH <10 11/28/2021   ETH 127 (H) 09/04/2021    Physical Findings:  CIWA:    COWS:     Musculoskeletal: Strength & Muscle Tone: within normal limits Gait & Station: normal Patient leans: N/A  Psychiatric Specialty Exam:  Presentation  General Appearance: Appropriate for Environment; Casual  Eye Contact:Good  Speech:Clear and Coherent; Normal Rate  Speech Volume:Normal  Handedness:Right   Mood and Affect  Mood:Depressed; Dysphoric; Hopeless  Affect:Congruent; Depressed   Thought  Process  Thought Processes:Coherent; Goal Directed  Descriptions of Associations:Intact  Orientation:Full (Time, Place and Person)  Thought Content:Logical  History of Schizophrenia/Schizoaffective disorder:No  Duration of Psychotic Symptoms:No data recorded Hallucinations:No data recorded Ideas of Reference:None  Suicidal Thoughts:No data recorded Homicidal Thoughts:No data recorded  Sensorium  Memory:Immediate Fair; Recent Fair; Remote Fair  Judgment:Fair  Insight:Fair   Executive Functions  Concentration:Fair  Attention Span:Fair  Recall:Fair  Fund of Knowledge:Fair  Language:Fair   Psychomotor Activity  Psychomotor Activity:No data recorded  Assets  Assets:Resilience; Desire for Improvement; Social Support   Sleep  Sleep:No data recorded   Physical Exam: Physical Exam Vitals and nursing note reviewed.  Constitutional:      Appearance: Normal appearance. He is normal weight.  HENT:     Head: Normocephalic.  Musculoskeletal:        General: Normal range of motion.  Skin:    General: Skin is warm.     Capillary Refill: Capillary refill takes less than 2 seconds.  Neurological:     General: No focal deficit present.     Mental Status: He is alert and oriented to person, place, and time. Mental status is at baseline.  Psychiatric:        Mood and Affect: Mood normal.        Behavior: Behavior normal.        Thought Content: Thought content normal.        Judgment: Judgment normal.    Review of Systems  Psychiatric/Behavioral:  Positive for depression. Negative for suicidal ideas. The patient is not nervous/anxious and does not have insomnia.   All other systems reviewed and are negative.  Blood pressure 111/87, pulse 83, temperature 98.2 F (36.8 C), temperature source Oral, resp. rate 17, height 5\' 9"  (1.753 m), weight 63.5 kg, SpO2 96 %. Body mass index is 20.67 kg/m.   Medical Decision Making:   Problem 1: Acute stress  reaction-patient initially presented with suicidal thoughts and worsening depression, secondary to diagnosis [facility] fever in which he developed malaise and fatigue. -Subsequently he was started on Cymbalta during his hospitalization, placed on one-to-one suicidal precautions, and resumed other home medications. Overall mood has improved, as response to factors listed above.  -Will need to obtain collateral information and corroborate  discharge planning with sister prior to discharging patient home.  Writer has made several attempts (x 7) to reach sister at phone number listed in chart unsuccessful.  Will defer to TOC/TTS to assist at this time.   Final disposition is pending, after collateral is obtained from sister.  The above plan will be communicated with primary nurse.  Patient has been tentatively accepted to [facility], however has declined bed due to improvement in clinical symptoms and denial of suicidal ideations at the time of discharge.  However as already noted we need to contact sister for collateral. Problem 2:   Problem 3:   Suella Broad, FNP 11/30/2021, 1:17 PM

## 2021-12-01 DIAGNOSIS — F332 Major depressive disorder, recurrent severe without psychotic features: Secondary | ICD-10-CM

## 2021-12-01 MED ORDER — QUETIAPINE FUMARATE 100 MG PO TABS: 100.0000 mg | ORAL_TABLET | Freq: Every day | ORAL | 0 refills | Status: DC

## 2021-12-01 NOTE — ED Provider Notes (Addendum)
Emergency Medicine Observation Re-evaluation Note  Zachary [name] is a 61 y.o. male, seen on rounds today.  Pt initially presented to the ED for complaints of Suicidal Currently, the patient is resting.  Physical Exam  BP (!) 109/50 (BP Location: Left Arm)   Pulse 63   Temp 98.1 F (36.7 C) (Oral)   Resp 18   Ht 5\' 9"  (1.753 m)   Wt 63.5 kg   SpO2 93%   BMI 20.67 kg/m  Physical Exam General: NAD Cardiac: well perfused Lungs: even and unlabored Psych: No agitation  ED Course / MDM  EKG:EKG Interpretation  Date/Time:  Sunday November 28 2021 09:16:44 EDT Ventricular Rate:  69 PR Interval:  128 QRS Duration: 107 QT Interval:  402 QTC Calculation: 431 R Axis:   -37 Text Interpretation: Sinus rhythm Incomplete RBBB and LAFB ST elevation suggests acute pericarditis No significant change since prior 6/23 Confirmed by Aletta Edouard [phone]) on 11/28/2021 9:58:09 AM  I have reviewed the labs performed to date as well as medications administered while in observation.  Recent changes in the last 24 hours include none.  Plan  Current plan is for inpatient psychiatric hospitalization.  The patient was reassessed by psychiatry this morning and felt to no longer need inpatient care.  Recommended outpatient follow-up with resources.    Deno Etienne, DO 12/01/21 1004

## 2021-12-01 NOTE — Discharge Summary (Signed)
[facility] Psych ED Discharge  12/01/2021 10:02 AM Zachary [name]  MRN:  [id number]  Principal Problem: MDD (major depressive disorder), recurrent severe, without psychosis ([place]) Discharge Diagnoses: Principal Problem:   MDD (major depressive disorder), recurrent severe, without psychosis ([place]) Active Problems:   Alcohol use disorder   Suicidal thoughts   Substance induced mood disorder ([place])  Clinical Impression:  Final diagnoses:  Suicidal ideation   Subjective:  Zachary [name] is a 61 y.o. male patient admitted with depression and suicidal ideations. As noted he has presented with suicidal ideations that are described as fleeting, due to recent diagnosis of [facility] spotted fever.  He reports overall general malaise and fatigue, and that has led to worsening depression symptoms to include fleeting thoughts of suicidal ideations.  He reports they did become more intense, which prompted him to come to the emergency room x 3 days ago.  He states over this 3-day.  He feels much better from a mental standpoint, improved depression secondary to staff, medication, and time to reflect.  He also contributes his improvement to IV hydration and vitamin supplementation to correct his malaise and fatigue.  He states upon discharge he plans to return to his sister's home, where they will reside together for a period of time.    He has history of 1 previous suicide attempt by cutting of his wrists in May 2023.  He denies any current suicidal plan, suicidal ideation/thoughts, and or self-harm.    On evaluation patient is alert and oriented x 4, pleasant, and cooperative. Speech is clear and coherent. Mood is euthymic and affect is congruent with mood. Thought process is coherent and thought content is logical. Denies auditory and visual hallucinations. No indication that patient is responding to internal stimuli. No evidence of delusional thought content. Denies suicidal ideations. Denies homicidal  ideations. Denies substance abuse.    ED Assessment Time Calculation: Start Time: 0925 Stop Time: 0940 Total Time in Minutes (Assessment Completion): 15   Past Psychiatric History: Alcohol abuse, anxiety, depression, suicidal ideation.  Past Medical History:  Past Medical History:  Diagnosis Date   Alcohol abuse    Anxiety    COPD (chronic obstructive pulmonary disease) ([place])    Depression    Hypertension    Suicidal ideation     Past Surgical History:  Procedure Laterality Date   COLONOSCOPY     MOUTH SURGERY     NO PAST SURGERIES     Family History:  Family History  Problem Relation Age of Onset   Heart attack Paternal Grandfather    Stroke Mother    Heart failure Mother    Heart failure Father     Social History:  Social History   Substance and Sexual Activity  Alcohol Use Yes   Comment: BAC was .23 on admission     Social History   Substance and Sexual Activity  Drug Use Yes   Types: Marijuana   Comment: UDS was negative    Social History   Socioeconomic History   Marital status: Divorced    Spouse name: Not on file   Number of children: 2   Years of education: Not on file   Highest education level: Not on file  Occupational History   Occupation: Clinical biochemist  Tobacco Use   Smoking status: Some Days    Packs/day: 2.00    Types: Cigarettes   Smokeless tobacco: Never  Vaping Use   Vaping Use: Not on file  Substance and Sexual Activity  Alcohol use: Yes    Comment: BAC was .23 on admission   Drug use: Yes    Types: Marijuana    Comment: UDS was negative   Sexual activity: Not Currently  Other Topics Concern   Not on file  Social History Narrative   Pt stated that he lives alone; works as a Sports coach Determinants of Corporate investment banker Strain: Not on Ship broker Insecurity: Not on file  Transportation Needs: Not on file  Physical Activity: Not on file  Stress: Not on file  Social Connections: Not on  file    Tobacco Cessation:  A prescription for an FDA-approved tobacco cessation medication was offered at discharge and the patient refused  Current Medications: Current Facility-Administered Medications  Medication Dose Route Frequency Provider Last Rate Last Admin   gabapentin (NEURONTIN) capsule 300 mg  300 mg Oral TID Leevy-Johnson, Brooke A, NP   300 mg at 12/01/21 0948   lisinopril (ZESTRIL) tablet 10 mg  10 mg Oral Daily Leevy-Johnson, Brooke A, NP   10 mg at 12/01/21 9937   And   hydrochlorothiazide (HYDRODIURIL) tablet 12.5 mg  12.5 mg Oral Daily Leevy-Johnson, Brooke A, NP   12.5 mg at 12/01/21 0949   pantoprazole (PROTONIX) EC tablet 40 mg  40 mg Oral Daily Leevy-Johnson, Brooke A, NP   40 mg at 12/01/21 0948   QUEtiapine (SEROQUEL) tablet 100 mg  100 mg Oral QHS Leevy-Johnson, Brooke A, NP   100 mg at 11/30/21 2109   sucralfate (CARAFATE) tablet 1 g  1 g Oral TID WC & HS Leevy-Johnson, Brooke A, NP   1 g at 12/01/21 1696   Current Outpatient Medications  Medication Sig Dispense Refill   acetaminophen (TYLENOL) 500 MG tablet Take 1,000 mg by mouth every 6 (six) hours as needed for mild pain.     DULoxetine (CYMBALTA) 30 MG capsule Take 90 mg by mouth daily.     gabapentin (NEURONTIN) 300 MG capsule Take 1 capsule (300 mg total) by mouth 3 (three) times daily. 90 capsule 0   lisinopril-hydrochlorothiazide (ZESTORETIC) 10-12.5 MG tablet Take 1 tablet by mouth daily.     naltrexone (DEPADE) 50 MG tablet Take 50 mg by mouth daily.     cloNIDine (CATAPRES) 0.1 MG tablet Take 0.1 mg by mouth daily. (Patient not taking: Reported on 10/26/2021)     EPINEPHrine 0.3 mg/0.3 mL IJ SOAJ injection Inject 0.3 mg into the muscle as needed for anaphylaxis. (Patient not taking: Reported on 11/28/2021) 1 each 0   omeprazole (PRILOSEC) 20 MG capsule Take 1 capsule (20 mg total) by mouth daily. (Patient not taking: Reported on 11/28/2021) 30 capsule 0   QUEtiapine (SEROQUEL) 100 MG tablet Take 1 tablet  (100 mg total) by mouth at bedtime. 30 tablet 0   sucralfate (CARAFATE) 1 g tablet Take 1 tablet (1 g total) by mouth 4 (four) times daily -  with meals and at bedtime. (Patient not taking: Reported on 11/28/2021) 60 tablet 0   PTA Medications: (Not in a hospital admission)   [place] Scale:  Flowsheet Row ED from 11/28/2021 in [facility] from 10/31/2021 [facility] from 10/26/2021 in [facility]  C-SSRS RISK CATEGORY High Risk No Risk No Risk       Musculoskeletal: Strength & Muscle Tone: within normal limits Gait & Station: normal Patient leans: N/A  Psychiatric Specialty Exam: Presentation  General Appearance:  Appropriate for Environment; Neat  Eye Contact:Good  Speech:Clear and Coherent; Normal Rate  Speech Volume:Normal  Handedness:Right   Mood and Affect  Mood:Euthymic  Affect:Congruent   Thought Process  Thought Processes:Coherent; Goal Directed; Linear  Descriptions of Associations:Intact  Orientation:Full (Time, Place and Person)  Thought Content:Logical  History of Schizophrenia/Schizoaffective disorder:No  Duration of Psychotic Symptoms:No data recorded Hallucinations:Hallucinations: None  Ideas of Reference:None  Suicidal Thoughts:Suicidal Thoughts: No  Homicidal Thoughts:Homicidal Thoughts: No   Sensorium  Memory:Immediate Good; Recent Good; Remote Good  Judgment:Fair  Insight:Fair   Executive Functions  Concentration:Good  Attention Span:Good  Recall:Good  Fund of Knowledge:Good  Language:Good   Psychomotor Activity  Psychomotor Activity:Psychomotor Activity: Normal   Assets  Assets:Desire for Improvement; Manufacturing systems engineerCommunication Skills; Financial Resources/Insurance   Sleep  Sleep:Sleep: Good    Physical Exam: Physical Exam Constitutional:      General: He is not in acute distress.    Appearance: He is not  ill-appearing, toxic-appearing or diaphoretic.  HENT:     Right Ear: External ear normal.     Left Ear: External ear normal.  Eyes:     General:        Right eye: No discharge.        Left eye: No discharge.  Cardiovascular:     Rate and Rhythm: Normal rate.  Pulmonary:     Effort: Pulmonary effort is normal. No respiratory distress.  Musculoskeletal:        General: Normal range of motion.  Neurological:     Mental Status: He is alert and oriented to person, place, and time.  Psychiatric:        Thought Content: Thought content is not paranoid or delusional. Thought content does not include homicidal or suicidal ideation.    Review of Systems  Constitutional:  Negative for chills, diaphoresis, fever, malaise/fatigue and weight loss.  Respiratory:  Negative for cough and shortness of breath.   Cardiovascular:  Negative for chest pain.  Gastrointestinal:  Negative for diarrhea, nausea and vomiting.  Neurological:  Negative for dizziness and seizures.  Psychiatric/Behavioral:  Negative for depression, hallucinations, memory loss and suicidal ideas. The patient is not nervous/anxious.    Blood pressure (!) 125/113, pulse 71, temperature 98.4 F (36.9 C), temperature source Oral, resp. rate 16, height 5\' 9"  (1.753 m), weight 63.5 kg, SpO2 96 %. Body mass index is 20.67 kg/m.   Demographic Factors:  Male, Caucasian, Living alone, and Unemployed  Loss Factors: Financial problems/change in socioeconomic status  Historical Factors: Prior suicide attempts and Family history of mental illness or substance abuse  Risk Reduction Factors:   Religious beliefs about death and Patient will be living with his sister for a while  Continued Clinical Symptoms:  Alcohol/Substance Abuse/Dependencies  Cognitive Features That Contribute To Risk:  None    Suicide Risk:  Mild:  Suicidal ideation of limited frequency, intensity, duration, and specificity.  There are no identifiable plans, no  associated intent, mild dysphoria and related symptoms, good self-control (both objective and subjective assessment), few other risk factors, and identifiable protective factors, including available and accessible social support.   Follow-up Information     [facility] Follow [facility].   Specialty: Urgent Care Why: As needed, If symptoms worsen Contact information: [address] [place] [id number] [phone]               Medical Decision Making: At time of discharge, patient denies SI, HI, AVH and is able to contract for safety.  He demonstrated no overt evidence of psychosis or mania. Prior to discharge the verbalized that the understood warning signs, triggers, and symptoms of worsening mental health and how to access emergency mental health care if they felt it was needed. Patient was instructed to call 911 or return to the emergency room if they experienced any concerning symptoms after discharge. Patient voiced understanding and agreed to this.   Disposition: No evidence of imminent risk to self or others at present.   Patient does not meet criteria for psychiatric inpatient admission. Discussed crisis plan, support from social network, calling 911, coming to the Emergency Department, and calling Suicide Hotline.     Discharge Instructions       Discharge recommendations:  Patient is to take medications as prescribed. Please see information for follow-up appointment with psychiatry and therapy. Please follow up with your primary care provider for all medical related needs.   Therapy: We recommend that patient participate in individual therapy to address mental health concerns.  Medications: The patient is to contact a medical professional and/or outpatient provider to address any new side effects that develop. Patient  should update outpatient providers of any new medications and/or medication changes.   Atypical antipsychotics: If  you are prescribed an atypical antipsychotic, it is recommended that your height, weight, BMI, blood pressure, fasting lipid panel, and fasting blood sugar be monitored by your outpatient providers.  Safety:  The patient should abstain from use of illicit substances/drugs and abuse of any medications. If symptoms worsen or do not continue to improve or if the patient becomes actively suicidal or homicidal then it is recommended that the patient return to the closest hospital emergency department, the [facility], or call 911 for further evaluation and treatment. National Suicide Prevention Lifeline 1-800-SUICIDE or (534) 683-8482.  About 988 988 offers 24/7 access to trained crisis counselors who can help people experiencing mental health-related distress. People can call or text 988 or chat 988lifeline.org for themselves or if they are worried about a loved one who may need crisis support.  Crisis Mobile: [facility]:                     (218) 637-0395 (for crisis response 24 hours a day) [facility] Hotline:                                            424-080-4784   =================================================================================== Substance Abuse Treatment Programs   Intensive Outpatient Programs [facility]                                   [address]                                                        [place], [place]  [phone]                                                     [facility] [address] [place] [address] [place], [place] [phone]   [facility]                             (Inpatient and outpatient)                                             [address]                                                                                                                 [phone]                 [facility] [phone] (Suboxone and Methadone)   [address]                                                           [place], [place] [address]                                       [phone]                                                     [address] [place], [place] [phone]   [facility] (Outpatient/Inpatient, [facility])                    (insurance only) [phone]  [facility] (Groups & Residential) [place], [place] [phone]                            [facility]                                        [address]                                         [place], [place]                                                           [phone]                                                     [facility] (for caregivers and family) [phone] [place], [place] [phone] Outpatient Psychiatry and Counseling   [facility]: Mobile Crisis Management 24 hours:  [phone]   [facility] of the [employer] sliding scale fee and walk in schedule: M-F 8am-12pm/1pm-3pm [address]  [place], [place] [address] [phone]   [facility] [address] [place], [place] [address] [phone]   [facility] ([facility])- new patient walk-in appointments available Monday - Friday 8am -3pm.          [address] [place], [place] [address] [phone] or crisis line- [phone]   [facility] Outpatient Services/ Intensive Outpatient Therapy Program [address] [place], [place] [address] [phone]   [facility]                                                 Crisis Services                                                              [phone] [address]                                      [place], [place] [address]  [facility]   [facility] [phone] [address] [place], [place] [id number]     [facility]                                                                                                             [address] [place],  [place], [place] [address]                                                           [phone]   [facility] [address] [place], [place] [address] [phone]   [facility]                                   [address]                            [place], [place] [address]                                               [phone]                                                     Andee PolesParish McKinney, MD                                      [address]                                                [place] [id number]                                                [phone]                                         [facility] ([facility] [address] [place] [phone]   [facility]  [address] [place], [place]                                                [phone]                                                     [facility] Eulogio Ditch, MD [address] [place], [place] [address] [phone]   [facility]                                               [address]                                                [place], [place] [address]                                               [phone]                                                      [facility] [address] [place], [place] [address] [phone] Resources for Temporary Residential Assistance/Crisis Centers   [facility]) M-F 8am-3pm   [address] [place], [place] [address]   [phone] Services include: laundry, barbering, support groups, case management, phone  & computer access, showers, AA/NA mtgs, mental health/substance abuse nurse, job skills class, disability information, VA assistance, spiritual classes, etc.          Jackelyn Poling, NP 12/01/2021, 10:02 AM

## 2021-12-01 NOTE — Discharge Instructions (Addendum)
Discharge recommendations:  Patient is to take medications as prescribed. Please see information for follow-up appointment with psychiatry and therapy. Please follow up with your primary care provider for all medical related needs.   Therapy: We recommend that patient participate in individual therapy to address mental health concerns.  Medications: The patient is to contact a medical professional and/or outpatient provider to address any new side effects that develop. Patient  should update outpatient providers of any new medications and/or medication changes.   Atypical antipsychotics: If you are prescribed an atypical antipsychotic, it is recommended that your height, weight, BMI, blood pressure, fasting lipid panel, and fasting blood sugar be monitored by your outpatient providers.  Safety:  The patient should abstain from use of illicit substances/drugs and abuse of any medications. If symptoms worsen or do not continue to improve or if the patient becomes actively suicidal or homicidal then it is recommended that the patient return to the closest hospital emergency department, the [facility], or call 911 for further evaluation and treatment. National Suicide Prevention Lifeline 1-800-SUICIDE or 804-705-8339.  About 988 988 offers 24/7 access to trained crisis counselors who can help people experiencing mental health-related distress. People can call or text 988 or chat 988lifeline.org for themselves or if they are worried about a loved one who may need crisis support.  Crisis Mobile: [facility]:                     (909)742-9425 (for crisis response 24 hours a day) [facility] Hotline:                                            (571)153-0427   =================================================================================== Substance Abuse Treatment Programs   Intensive Outpatient Programs [facility]                                    [place] [facility]. [address]                                                        [place], [place]                                                     [facility] [place] [address] [place], [place]                             (Inpatient and outpatient)  [place] [phone] (Suboxone and Methadone)   [place], [place] [address]                                       [place] [address] [place], [place]   [facility] (Outpatient/Inpatient, [place])                    (insurance only) [phone]                                                                                                                                    [facility] ([place]) [place], [place]                            [facility]                                        [place], [place]  [phone]                                                     [facility] (for caregivers and family) [address] [place], [place] [facility]   [facility]: Mobile Crisis Management 24 hours:  [phone]   [facility] [employer] sliding scale fee and walk in schedule: M-F 8am-12pm/1pm-3pm [place], [place] [address] [address], [place] [address] [phone]   [facility] ([facility])- new patient walk-in appointments available Monday - Friday 8am -3pm.          [address] [place], [place] [address] [phone] or crisis line- [facility]/ Intensive Outpatient Therapy Program [place], [place] [address] [place]                                                             [phone] [address], [place] [address]                                                 [place]   [facility] Suite [phone]. [place], [place] [address]     [employer] [facility]                                                                                                             [address],  [place], [place] [address]                                                           (  [phone]   [facility] [address] [place], [place] [address] [phone]   [facility]                                   [address]                            [place], [place] [address]                                               [phone]                                                     Andee Poles, MD                                      [address]                                                [place] [address]                                                [phone]                                         [facility] [address] [place] [address]   [facility]                                                [address]                                      [place], [place]                                                [phone]                                                     [facility] Eulogio Ditch, MD [address] [place], [place] [address] [phone]   [facility]  [address]                                                [place], [place] [address]                                               [phone]                                                     [facility] [address] [place], [place] [address] [phone] Resources for Temporary Residential Assistance/Crisis [place] [facility]) M-F 8am-3pm   [address], [place] [address]   [phone] Services include: laundry, barbering, support groups, case management, phone  & computer access, showers, AA/NA mtgs, mental health/substance abuse nurse, job skills class, disability information, VA assistance, spiritual classes, etc.

## 2021-12-01 NOTE — Progress Notes (Signed)
Transition of Care [facility]) - Emergency Department Mini Assessment   Patient Details  Name: Zachary [name] MRN: [id number] Date of Birth: [birth year]  [facility]) CM/SW Contact:    Kimber Relic, LCSW Phone Number: 12/01/2021, 10:43 AM   Clinical Narrative: Pt has declined needing transportation and stated "My sister has an appointment so I'll just call an [facility]." This CSW confirmed that the pt has funds to pay for an [facility] Popper and he stated he does. TOC did not provide transportation resources per the clients request.   ED Mini Assessment: What brought you to the Emergency Department? : SI  Barriers to Discharge: Transportation  Barrier interventions: Offered transportation  Means of departure: Other (enter comment) [facility] Popper)       Patient Contact and Communications Key Contact 1: Patient   Spoke with: Patient Contact Date: 12/01/21,   Contact time: 1030             Admission diagnosis:  SI Patient Active Problem List   Diagnosis Date Noted   Substance induced mood disorder ([place]) 09/06/2021   MDD (major depressive disorder), recurrent episode, severe ([place]) 07/22/2021   Suicide and self-inflicted injury by cutting and piercing instrument ([place]) 07/21/2021   Alcohol use disorder 07/21/2021   Alcohol-induced mood disorder with depressive symptoms ([place]) 07/21/2021   High anion gap metabolic acidosis 57/26/2035   Lactic acidosis 12/09/2019   COPD (chronic obstructive pulmonary disease) ([place])    Alcohol use disorder, severe, dependence ([place]) 07/08/2018   Prolonged Q-T interval on ECG    Suicidal thoughts    MDD (major depressive disorder), recurrent severe, without psychosis ([place]) 12/30/2016   Hypertension 10/28/2015   Alcohol use disorder 10/28/2015   Hyperglycemia 10/28/2015   Tobacco abuse 10/28/2015   PCP:  System, Provider Not In Pharmacy:   [id number] [place], [address] [place] [place]  [address] Phone: [phone] Fax: [phone]  [id number] - [place], [place]. [address] [address] Phone: [phone] Fax: [phone]

## 2022-01-02 ENCOUNTER — Emergency Department (HOSPITAL_COMMUNITY)
Admission: EM | Admit: 2022-01-02 | Discharge: 2022-01-02 | Disposition: A | Discharge status: Home / Self Care | Payer: Medicaid Other | Attending: Emergency Medicine | Admitting: Emergency Medicine

## 2022-01-02 ENCOUNTER — Encounter (HOSPITAL_COMMUNITY): Payer: Self-pay | Admitting: Emergency Medicine

## 2022-01-02 DIAGNOSIS — R45851 Suicidal ideations: Secondary | ICD-10-CM | POA: Diagnosis not present

## 2022-01-02 DIAGNOSIS — Z1152 Encounter for screening for COVID-19: Secondary | ICD-10-CM | POA: Insufficient documentation

## 2022-01-02 DIAGNOSIS — F10129 Alcohol abuse with intoxication, unspecified: Secondary | ICD-10-CM | POA: Insufficient documentation

## 2022-01-02 DIAGNOSIS — F1010 Alcohol abuse, uncomplicated: Secondary | ICD-10-CM

## 2022-01-02 DIAGNOSIS — Y908 Blood alcohol level of 240 mg/100 ml or more: Secondary | ICD-10-CM | POA: Insufficient documentation

## 2022-01-02 DIAGNOSIS — F331 Major depressive disorder, recurrent, moderate: Secondary | ICD-10-CM | POA: Diagnosis not present

## 2022-01-02 DIAGNOSIS — F329 Major depressive disorder, single episode, unspecified: Secondary | ICD-10-CM | POA: Diagnosis not present

## 2022-01-02 LAB — COMPREHENSIVE METABOLIC PANEL
ALT: 18 U/L (ref 0–44)
AST: 24 U/L (ref 15–41)
Albumin: 4.3 g/dL (ref 3.5–5.0)
Alkaline Phosphatase: 87 U/L (ref 38–126)
Anion gap: 10 (ref 5–15)
BUN: 7 mg/dL — ABNORMAL LOW (ref 8–23)
CO2: 20 mmol/L — ABNORMAL LOW (ref 22–32)
Calcium: 8.5 mg/dL — ABNORMAL LOW (ref 8.9–10.3)
Chloride: 106 mmol/L (ref 98–111)
Creatinine, Ser: 0.74 mg/dL (ref 0.61–1.24)
GFR, Estimated: >60 mL/min (ref >60)
Glucose, Bld: 89 mg/dL (ref 70–99)
Potassium: 4.3 mmol/L (ref 3.5–5.1)
Sodium: 136 mmol/L (ref 135–145)
Total Bilirubin: 0.6 mg/dL (ref 0.3–1.2)
Total Protein: 7.1 g/dL (ref 6.5–8.1)

## 2022-01-02 LAB — CBC
HCT: 45.4 % (ref 39.0–52.0)
Hemoglobin: 16.5 g/dL (ref 13.0–17.0)
MCH: 33.9 pg (ref 26.0–34.0)
MCHC: 36.3 g/dL — ABNORMAL HIGH (ref 30.0–36.0)
MCV: 93.2 fL (ref 80.0–100.0)
Platelets: 210 10*3/uL (ref 150–400)
RBC: 4.87 MIL/uL (ref 4.22–5.81)
RDW: 12.9 % (ref 11.5–15.5)
WBC: 10.7 10*3/uL — ABNORMAL HIGH (ref 4.0–10.5)
nRBC: 0 % (ref 0.0–0.2)

## 2022-01-02 LAB — ETHANOL: Alcohol, Ethyl (B): 240 mg/dL — ABNORMAL HIGH (ref <10)

## 2022-01-02 LAB — RAPID URINE DRUG SCREEN, HOSP PERFORMED
Amphetamines: NOT DETECTED
Barbiturates: NOT DETECTED
Benzodiazepines: NOT DETECTED
Cocaine: NOT DETECTED
Opiates: NOT DETECTED
Tetrahydrocannabinol: NOT DETECTED

## 2022-01-02 LAB — RESP PANEL BY RT-PCR (FLU A&B, COVID) ARPGX2
Influenza A by PCR: NEGATIVE
Influenza B by PCR: NEGATIVE
SARS Coronavirus 2 by RT PCR: NEGATIVE

## 2022-01-02 LAB — ACETAMINOPHEN LEVEL: Acetaminophen (Tylenol), Serum: <10 ug/mL — ABNORMAL LOW (ref 10–30)

## 2022-01-02 LAB — SALICYLATE LEVEL: Salicylate Lvl: <7 mg/dL — ABNORMAL LOW (ref 7.0–30.0)

## 2022-01-02 MED ORDER — LISINOPRIL 10 MG PO TABS
10.0000 mg | ORAL_TABLET | Freq: Every day | ORAL | Status: DC
  Administered 2022-01-02: 10 mg via ORAL
  Filled 2022-01-02: qty 1

## 2022-01-02 MED ORDER — HYDROCHLOROTHIAZIDE 12.5 MG PO TABS
12.5000 mg | ORAL_TABLET | Freq: Every day | ORAL | Status: DC
  Administered 2022-01-02: 12.5 mg via ORAL
  Filled 2022-01-02: qty 1

## 2022-01-02 MED ORDER — THIAMINE MONONITRATE 100 MG PO TABS
100.0000 mg | ORAL_TABLET | Freq: Every day | ORAL | Status: DC
  Administered 2022-01-02: 100 mg via ORAL
  Filled 2022-01-02: qty 1

## 2022-01-02 MED ORDER — QUETIAPINE FUMARATE 100 MG PO TABS: 100.0000 mg | ORAL_TABLET | Freq: Every day | ORAL | Status: DC

## 2022-01-02 MED ORDER — LISINOPRIL-HYDROCHLOROTHIAZIDE 10-12.5 MG PO TABS: 1.0000 | ORAL_TABLET | Freq: Every day | ORAL | Status: DC

## 2022-01-02 MED ORDER — DULOXETINE HCL 30 MG PO CPEP
90.0000 mg | ORAL_CAPSULE | Freq: Every day | ORAL | Status: DC
  Administered 2022-01-02: 90 mg via ORAL
  Filled 2022-01-02: qty 3

## 2022-01-02 MED ORDER — LORAZEPAM 1 MG PO TABS
0.0000 mg | ORAL_TABLET | Freq: Four times a day (QID) | ORAL | Status: DC
  Administered 2022-01-02 (×2): 2 mg via ORAL
  Filled 2022-01-02 (×2): qty 2

## 2022-01-02 MED ORDER — THIAMINE HCL 100 MG/ML IJ SOLN: 100.0000 mg | Freq: Every day | INTRAMUSCULAR | Status: DC

## 2022-01-02 MED ORDER — PANTOPRAZOLE SODIUM 40 MG PO TBEC
40.0000 mg | DELAYED_RELEASE_TABLET | Freq: Every day | ORAL | Status: DC
  Administered 2022-01-02: 40 mg via ORAL
  Filled 2022-01-02: qty 1

## 2022-01-02 MED ORDER — CLONIDINE HCL 0.1 MG PO TABS
0.1000 mg | ORAL_TABLET | Freq: Every day | ORAL | Status: DC
  Administered 2022-01-02: 0.1 mg via ORAL
  Filled 2022-01-02: qty 1

## 2022-01-02 MED ORDER — LORAZEPAM 2 MG/ML IJ SOLN: 0.0000 mg | Freq: Four times a day (QID) | INTRAMUSCULAR | Status: DC

## 2022-01-02 MED ORDER — LORAZEPAM 2 MG/ML IJ SOLN: 0.0000 mg | Freq: Two times a day (BID) | INTRAMUSCULAR | Status: DC

## 2022-01-02 MED ORDER — LORAZEPAM 1 MG PO TABS: 0.0000 mg | ORAL_TABLET | Freq: Two times a day (BID) | ORAL | Status: DC

## 2022-01-02 NOTE — ED Notes (Addendum)
Pt states that he is not changing out because his "clothes are warmer." Offered a warm blanket- he declined. Pt not IVC'd.

## 2022-01-02 NOTE — ED Notes (Signed)
Patient dressed into purple scrubs, wearing non-slip socks. Two white bag of pt belongings taken and secured in triage 'patient belonging' area, cabinets closest to the white fridge. Clothing, shoes and 62 dollars in cash (attempted to call security to secure this cash, ne response. Pt agreed to place in white bags.

## 2022-01-02 NOTE — ED Provider Notes (Signed)
[facility] Emergency Department Provider Note MRN:  [id number]  Arrival date & time: 01/02/22     Chief Complaint   Suicidal (/)   History of Present Illness   Zachary [name] is a 61 y.o. year-old male presents to the ED with chief complaint of suicidal thoughts.  Patient states that he has been depressed and suicidal for the past couple of days.  He reports that he has been drinking heavily.  Denies any drug use.  Denies any recent illnesses.  Denies being in any pain.  History of similar.  States he has not been taking any medications.  History provided by patient.   Review of Systems  Pertinent positive and negative review of systems noted in HPI.    Physical Exam   Vitals:   01/02/22 0044  BP: (!) 141/81  Pulse: 89  Resp: 16  Temp: 98.6 F (37 C)  SpO2: 98%    CONSTITUTIONAL:  intoxicated-appearing, NAD NEURO:  Alert and oriented x 3, CN 3-12 grossly intact EYES:  eyes equal and reactive ENT/NECK:  Supple, no stridor  CARDIO:  appears well-perfused  PULM:  No respiratory distress,  GI/GU:  non-distended,  MSK/SPINE:  No gross deformities, no edema, moves all extremities  SKIN:  no rash, atraumatic   *Additional and/or pertinent findings included in MDM below  Diagnostic and Interventional Summary    Labs Reviewed  COMPREHENSIVE METABOLIC PANEL - Abnormal; Notable for the following components:      Result Value   CO2 20 (*)    BUN 7 (*)    Calcium 8.5 (*)    All other components within normal limits  ETHANOL - Abnormal; Notable for the following components:   Alcohol, Ethyl (B) 240 (*)    All other components within normal limits  SALICYLATE LEVEL - Abnormal; Notable for the following components:   Salicylate Lvl <4.0 (*)    All other components within normal limits  ACETAMINOPHEN LEVEL - Abnormal; Notable for the following components:   Acetaminophen (Tylenol), Serum <10 (*)    All other components within normal limits   CBC - Abnormal; Notable for the following components:   WBC 10.7 (*)    MCHC 36.3 (*)    All other components within normal limits  RESP PANEL BY RT-PCR (FLU A&B, COVID) ARPGX2  RAPID URINE DRUG SCREEN, HOSP PERFORMED    No orders to display    Medications  LORazepam (ATIVAN) injection 0-4 mg (has no administration in time range)    Or  LORazepam (ATIVAN) tablet 0-4 mg (has no administration in time range)  LORazepam (ATIVAN) injection 0-4 mg (has no administration in time range)    Or  LORazepam (ATIVAN) tablet 0-4 mg (has no administration in time range)  thiamine (VITAMIN B1) tablet 100 mg (has no administration in time range)    Or  thiamine (VITAMIN B1) injection 100 mg (has no administration in time range)  cloNIDine (CATAPRES) tablet 0.1 mg (has no administration in time range)  DULoxetine (CYMBALTA) DR capsule 90 mg (has no administration in time range)  pantoprazole (PROTONIX) EC tablet 40 mg (has no administration in time range)  QUEtiapine (SEROQUEL) tablet 100 mg (has no administration in time range)  lisinopril (ZESTRIL) tablet 10 mg (has no administration in time range)    And  hydrochlorothiazide (HYDRODIURIL) tablet 12.5 mg (has no administration in time range)     Procedures  /  Critical Care Procedures  ED Course and Medical Decision Making  I have reviewed the triage vital signs, the nursing notes, and pertinent available records from the EMR.  Social Determinants Affecting Complexity of Care: Patient is affected by alcoholism.   ED Course:    Medical Decision Making Patient here for reported SI.  He seems clinically intoxicated.  States that he has been feeling suicidal for the past several days.  He denies a specific plan.  Denies any recent illnesses.  Denies any drug use.  Patient appears medically stable for TTS evaluation, but will likely need to sober up first.    Amount and/or Complexity of Data Reviewed Labs: ordered.    Details: Ethanol  level 240 No significant electrolyte derangement No significant leukocytosis  Risk OTC drugs. Prescription drug management.     Consultants: TTS consult pending.   Treatment and Plan: Dispo per TTS.  Awaiting evaluation for suicidal thoughts.    Final Clinical Impressions(s) / ED Diagnoses     ICD-10-CM   1. Suicidal thoughts  R45.851     2. Alcohol abuse  F10.10       ED Discharge Orders     None         Discharge Instructions Discussed with and Provided to Patient:   Discharge Instructions   None      Montine Circle, PA-C 0000000 [id number]    Delora Fuel, MD 0000000 [phone]

## 2022-01-02 NOTE — ED Notes (Signed)
Provided with breakfast tray

## 2022-01-02 NOTE — Discharge Summary (Signed)
[facility] Psych ED Discharge  01/02/2022 11:03 AM Zachary [name]  MRN:  [id number]  Principal Problem: MDD (major depressive disorder), recurrent episode, moderate (HCC) Discharge Diagnoses: Principal Problem:   MDD (major depressive disorder), recurrent episode, moderate (HCC) Active Problems:   Alcohol use disorder  Clinical Impression:  Final diagnoses:  Suicidal thoughts  Alcohol abuse    ED Assessment Time Calculation: Start Time: 1000 Stop Time: 1020 Total Time in Minutes (Assessment Completion): 20  Subjective: Zachary [name], a 61 year old male with a history of anxiety, depression and alcohol use disorder, presents to [facility] due to experiencing suicidal thoughts with no plan.  HPI: Zachary [name], a 61 year old male with a history of anxiety, depression and alcohol use disorder, presents to [facility] due to experiencing suicidal thoughts. He reported that his emotional state began deteriorating on Wednesday after work. In response to his distress, he started consuming alcohol, drinking approximately two coffee-cup sized cups of wine each day for sleep, without the intention of getting drunk. He disclosed that he had been sober since May before this episode occurred. Zachary [name] denied having suicidal or homicidal ideations, as well as any auditory or visual hallucinations. He mentioned that he had been prescribed several psychotropic medications in the past for his depression but could only recall taking Duloxetine and Gabapentin, with Dr. Lyn Hollingshead from [facility] being the prescribing physician. He shared that he resided alone in [place] and generally had good sleep and appetite, although he hadn't eaten in the past three days due to not feeling well. BAL 240 on arrival to the ED. UDS negative.   During the evaluation, Zachary [name] appeared alert and oriented x4. His physical presentation was appropriate, and he exhibited cooperative behavior. When communicating, he spoke at a normal rate  and volume. His mood was characterized by depression, and his affect was congruent with his emotional state. Zachary [name]'s thought process was coherent, linear, and goal-directed, with his thought content being logical.  Zachary [name] requests discharge home. Denies suicidal ideations, homicidal ideations, and AVH. He does not meet criteria for involuntary commitment.   Past Psychiatric History: Anxiety, Depression, and Alcohol Use Disorder  Past Medical History:  Past Medical History:  Diagnosis Date   Alcohol abuse    Anxiety    COPD (chronic obstructive pulmonary disease) (HCC)    Depression    Hypertension    Suicidal ideation     Past Surgical History:  Procedure Laterality Date   COLONOSCOPY     MOUTH SURGERY     NO PAST SURGERIES     Family History:  Family History  Problem Relation Age of Onset   Heart attack Paternal Grandfather    Stroke Mother    Heart failure Mother    Heart failure Father    Social History:  Social History   Substance and Sexual Activity  Alcohol Use Yes   Comment: BAC was .23 on admission     Social History   Substance and Sexual Activity  Drug Use Yes   Types: Marijuana   Comment: UDS was negative    Social History   Socioeconomic History   Marital status: Divorced    Spouse name: Not on file   Number of children: 2   Years of education: Not on file   Highest education level: Not on file  Occupational History   Occupation: Product/process development scientist  Tobacco Use   Smoking status: Some Days    Packs/day: 2.00    Types: Cigarettes   Smokeless tobacco: Never  Vaping Use   Vaping Use: Not on file  Substance and Sexual Activity   Alcohol use: Yes    Comment: BAC was .23 on admission   Drug use: Yes    Types: Marijuana    Comment: UDS was negative   Sexual activity: Not Currently  Other Topics Concern   Not on file  Social History Narrative   Pt stated that he lives alone; works as a Theme park manager of Manufacturing engineer Strain: Not on Ship broker Insecurity: Not on file  Transportation Needs: Not on file  Physical Activity: Not on file  Stress: Not on file  Social Connections: Not on file    Tobacco Cessation:  A prescription for an FDA-approved tobacco cessation medication was offered at discharge and the patient refused  Current Medications: Current Facility-Administered Medications  Medication Dose Route Frequency Provider Last Rate Last Admin   cloNIDine (CATAPRES) tablet 0.1 mg  0.1 mg Oral Daily Roxy Horseman, PA-C   0.1 mg at 01/02/22 0932   DULoxetine (CYMBALTA) DR capsule 90 mg  90 mg Oral Daily Roxy Horseman, PA-C   90 mg at 01/02/22 0931   lisinopril (ZESTRIL) tablet 10 mg  10 mg Oral Daily Dione Booze, MD   10 mg at 01/02/22 3790   And   hydrochlorothiazide (HYDRODIURIL) tablet 12.5 mg  12.5 mg Oral Daily Dione Booze, MD   12.5 mg at 01/02/22 0931   LORazepam (ATIVAN) injection 0-4 mg  0-4 mg Intravenous Q6H Roxy Horseman, PA-C       Or   LORazepam (ATIVAN) tablet 0-4 mg  0-4 mg Oral Q6H Roxy Horseman, PA-C   2 mg at 01/02/22 0713   [START ON 01/04/2022] LORazepam (ATIVAN) injection 0-4 mg  0-4 mg Intravenous Q12H Roxy Horseman, PA-C       Or   [START ON 01/04/2022] LORazepam (ATIVAN) tablet 0-4 mg  0-4 mg Oral Q12H Roxy Horseman, PA-C       pantoprazole (PROTONIX) EC tablet 40 mg  40 mg Oral Daily Roxy Horseman, PA-C   40 mg at 01/02/22 0932   QUEtiapine (SEROQUEL) tablet 100 mg  100 mg Oral QHS Roxy Horseman, PA-C       thiamine (VITAMIN B1) tablet 100 mg  100 mg Oral Daily Roxy Horseman, PA-C   100 mg at 01/02/22 2409   Or   thiamine (VITAMIN B1) injection 100 mg  100 mg Intravenous Daily Roxy Horseman, PA-C       Current Outpatient Medications  Medication Sig Dispense Refill   acetaminophen (TYLENOL) 500 MG tablet Take 1,000 mg by mouth every 6 (six) hours as needed for mild pain. (Patient not taking: Reported on 01/02/2022)      cloNIDine (CATAPRES) 0.1 MG tablet Take 0.1 mg by mouth daily. (Patient not taking: Reported on 10/26/2021)     DULoxetine (CYMBALTA) 30 MG capsule Take 90 mg by mouth daily. (Patient not taking: Reported on 01/02/2022)     EPINEPHrine 0.3 mg/0.3 mL IJ SOAJ injection Inject 0.3 mg into the muscle as needed for anaphylaxis. (Patient not taking: Reported on 11/28/2021) 1 each 0   gabapentin (NEURONTIN) 300 MG capsule Take 1 capsule (300 mg total) by mouth 3 (three) times daily. (Patient not taking: Reported on 01/02/2022) 90 capsule 0   lisinopril-hydrochlorothiazide (ZESTORETIC) 10-12.5 MG tablet Take 1 tablet by mouth daily. (Patient not taking: Reported on 01/02/2022)     naltrexone (DEPADE) 50 MG tablet Take 50 mg by  mouth daily. (Patient not taking: Reported on 01/02/2022)     omeprazole (PRILOSEC) 20 MG capsule Take 1 capsule (20 mg total) by mouth daily. (Patient not taking: Reported on 11/28/2021) 30 capsule 0   QUEtiapine (SEROQUEL) 100 MG tablet Take 1 tablet (100 mg total) by mouth at bedtime. (Patient not taking: Reported on 01/02/2022) 30 tablet 0   sucralfate (CARAFATE) 1 g tablet Take 1 tablet (1 g total) by mouth 4 (four) times daily -  with meals and at bedtime. (Patient not taking: Reported on 11/28/2021) 60 tablet 0   PTA Medications: (Not in a hospital admission)   [place] Scale:  Flowsheet Row ED from 01/02/2022 in [facility] from 11/28/2021 in [facility] from 10/31/2021 in [facility]  C-SSRS RISK CATEGORY High Risk High Risk No Risk       Musculoskeletal: Strength & Muscle Tone: within normal limits Gait & Station: normal Patient leans: N/A  Psychiatric Specialty Exam: Presentation  General Appearance:  Appropriate for Environment  Eye Contact: Good  Speech: Clear and Coherent; Normal Rate  Speech Volume: Normal  Handedness: Right   Mood and Affect   Mood: Depressed  Affect: Congruent   Thought Process  Thought Processes: Coherent; Disorganized; Linear  Descriptions of Associations:Intact  Orientation:Full (Time, Place and Person)  Thought Content:Logical  History of Schizophrenia/Schizoaffective disorder:No  Duration of Psychotic Symptoms:No data recorded Hallucinations:Hallucinations: None  Ideas of Reference:None  Suicidal Thoughts:Suicidal Thoughts: No  Homicidal Thoughts:Homicidal Thoughts: No   Sensorium  Memory: Immediate Good; Recent Good; Remote Good  Judgment: Fair  Insight: Fair   Art therapist  Concentration: Good  Attention Span: Good  Recall: Good  Fund of Knowledge: Good  Language: Good   Psychomotor Activity  Psychomotor Activity: Psychomotor Activity: Normal   Assets  Assets: Communication Skills; Desire for Improvement; Financial Resources/Insurance; Housing   Sleep  Sleep: Sleep: Fair    Physical Exam: Physical Exam Constitutional:      General: He is not in acute distress.    Appearance: He is not ill-appearing, toxic-appearing or diaphoretic.  Eyes:     General:        Right eye: No discharge.        Left eye: No discharge.  Cardiovascular:     Rate and Rhythm: Normal rate.  Pulmonary:     Effort: Pulmonary effort is normal. No respiratory distress.  Musculoskeletal:        General: Normal range of motion.     Cervical back: Normal range of motion.  Skin:    General: Skin is warm and dry.  Neurological:     Mental Status: He is alert and oriented to person, place, and time.  Psychiatric:        Behavior: Behavior is cooperative.        Thought Content: Thought content is not paranoid or delusional. Thought content does not include homicidal or suicidal ideation.    Review of Systems  Respiratory:  Negative for cough and shortness of breath.   Cardiovascular:  Negative for chest pain.  Gastrointestinal:  Negative for diarrhea, nausea  and vomiting.  Psychiatric/Behavioral:  Positive for depression, substance abuse and suicidal ideas. Negative for hallucinations and memory loss. The patient is nervous/anxious and has insomnia.    Blood pressure (!) 152/85, pulse (!) 101, temperature 98.2 F (36.8 C), temperature source Oral, resp. rate 18, SpO2 98 %. There is no height or weight on file to calculate  BMI.   Demographic Factors:  Male and Living alone  Loss Factors: NA  Historical Factors: Family history of mental illness or substance abuse  Risk Reduction Factors:   Sense of responsibility to family, Religious beliefs about death, and Employed  Continued Clinical Symptoms:  Alcohol/Substance Abuse/Dependencies Previous Psychiatric Diagnoses and Treatments  Cognitive Features That Contribute To Risk:  None    Suicide Risk:  Mild:  Suicidal ideation of limited frequency, intensity, duration, and specificity.  There are no identifiable plans, no associated intent, mild dysphoria and related symptoms, good self-control (both objective and subjective assessment), few other risk factors, and identifiable protective factors, including available and accessible social support.   Follow-up [place], [place]. Follow up.   Why: As needed, If symptoms worsen Contact information: [address] [place] [address] [place] Follow up.   Specialty: Urgent Care Contact information: [place] [phone]                 Medical Decision Making: At time of discharge, patient denies SI, HI, AVH and is able to contract for safety. He demonstrated no overt evidence of psychosis or mania. Prior to discharge, Zachary [name] verbalized that he understood warning signs, triggers, and symptoms of worsening mental health and how to access emergency mental health care if they felt it was needed. Patient  was instructed to call 911 or return to the emergency room if they experienced any concerning symptoms after discharge. Patient voiced understanding and agreed to this.   Disposition: No evidence of imminent risk to self or others at present.   Patient does not meet criteria for psychiatric inpatient admission. Supportive therapy provided about ongoing stressors. Discussed crisis plan, support from social network, calling 911, coming to the Emergency Department, and calling Suicide Hotline.     Discharge Instructions       Discharge recommendations:  Patient is to take medications as prescribed. Please see information for follow-up appointment with psychiatry and therapy. Please follow up with your primary care provider for all medical related needs.   Therapy: We recommend that patient participate in individual therapy to address mental health concerns.  Medications: The patient is to contact a medical professional and/or outpatient provider to address any new side effects that develop. Patient should update outpatient providers of any new medications and/or medication changes.   Atypical antipsychotics: If you are prescribed an atypical antipsychotic, it is recommended that your height, weight, BMI, blood pressure, fasting lipid panel, and fasting blood sugar be monitored by your outpatient providers.  Safety:  The patient should abstain from use of illicit substances/drugs and abuse of any medications. If symptoms worsen or do not continue to improve or if the patient becomes actively suicidal or homicidal then it is recommended that the patient return to the closest hospital emergency department, the [facility], or call 911 for further evaluation and treatment. National Suicide Prevention Lifeline 1-800-SUICIDE or 8385245476.  About 988 988 offers 24/7 access to trained crisis counselors who can help people experiencing mental health-related  distress. People can call or text 988 or chat 988lifeline.org for themselves or if they are worried about a loved one who may need crisis support.  Crisis Mobile: [facility]:                     519-118-1637 (for  crisis response 24 hours a day) [facility] Hotline:                                            62376619261-(731)169-5307       Zachary PolingJason A Wendal Wilkie, NP 01/02/2022, 11:03 AM

## 2022-01-02 NOTE — ED Notes (Signed)
Patient requesting Ativan, discussed too soon to administer again. Notified of discharge instructions, is changing back into clothing. Reports he will call an [facility] Popper to get him back to [place].

## 2022-01-02 NOTE — Discharge Instructions (Signed)
  Discharge recommendations:  Patient is to take medications as prescribed. Please see information for follow-up appointment with psychiatry and therapy. Please follow up with your primary care provider for all medical related needs.   Therapy: We recommend that patient participate in individual therapy to address mental health concerns.  Medications: The patient is to contact a medical professional and/or outpatient provider to address any new side effects that develop. Patient should update outpatient providers of any new medications and/or medication changes.   Atypical antipsychotics: If you are prescribed an atypical antipsychotic, it is recommended that your height, weight, BMI, blood pressure, fasting lipid panel, and fasting blood sugar be monitored by your outpatient providers.  Safety:  The patient should abstain from use of illicit substances/drugs and abuse of any medications. If symptoms worsen or do not continue to improve or if the patient becomes actively suicidal or homicidal then it is recommended that the patient return to the closest hospital emergency department, the [facility], or call 911 for further evaluation and treatment. National Suicide Prevention Lifeline 1-800-SUICIDE or 1-800-273-8255.  About 988 988 offers 24/7 access to trained crisis counselors who can help people experiencing mental health-related distress. People can call or text 988 or chat 988lifeline.org for themselves or if they are worried about a loved one who may need crisis support.  Crisis Mobile: [facility]:                     1-877-626-1772 (for crisis response 24 hours a day) [facility] Hotline:                                            1-800-256-2452  

## 2022-01-02 NOTE — ED Triage Notes (Signed)
Pt BIB [facility] from home. ETOH. Reports SI for 3 days and wanted to go to the hospital. VSS with EMS.

## 2022-02-09 ENCOUNTER — Emergency Department (EMERGENCY_DEPARTMENT_HOSPITAL)
Admission: EM | Admit: 2022-02-09 | Discharge: 2022-02-10 | Disposition: A | Discharge status: Other Acute Inpatient Hospital | Payer: Medicaid Other | Source: Home / Self Care | Attending: Emergency Medicine | Admitting: Emergency Medicine

## 2022-02-09 ENCOUNTER — Encounter (HOSPITAL_COMMUNITY): Payer: Self-pay

## 2022-02-09 ENCOUNTER — Other Ambulatory Visit: Payer: Self-pay

## 2022-02-09 DIAGNOSIS — F332 Major depressive disorder, recurrent severe without psychotic features: Secondary | ICD-10-CM | POA: Insufficient documentation

## 2022-02-09 DIAGNOSIS — Z1152 Encounter for screening for COVID-19: Secondary | ICD-10-CM | POA: Insufficient documentation

## 2022-02-09 DIAGNOSIS — F1094 Alcohol use, unspecified with alcohol-induced mood disorder: Secondary | ICD-10-CM | POA: Diagnosis present

## 2022-02-09 DIAGNOSIS — I10 Essential (primary) hypertension: Secondary | ICD-10-CM | POA: Insufficient documentation

## 2022-02-09 DIAGNOSIS — F1024 Alcohol dependence with alcohol-induced mood disorder: Secondary | ICD-10-CM | POA: Insufficient documentation

## 2022-02-09 DIAGNOSIS — J449 Chronic obstructive pulmonary disease, unspecified: Secondary | ICD-10-CM | POA: Insufficient documentation

## 2022-02-09 DIAGNOSIS — T424X2A Poisoning by benzodiazepines, intentional self-harm, initial encounter: Secondary | ICD-10-CM | POA: Insufficient documentation

## 2022-02-09 DIAGNOSIS — F1090 Alcohol use, unspecified, uncomplicated: Secondary | ICD-10-CM | POA: Diagnosis present

## 2022-02-09 DIAGNOSIS — T1491XA Suicide attempt, initial encounter: Secondary | ICD-10-CM

## 2022-02-09 DIAGNOSIS — F1010 Alcohol abuse, uncomplicated: Secondary | ICD-10-CM | POA: Diagnosis present

## 2022-02-09 DIAGNOSIS — T50902A Poisoning by unspecified drugs, medicaments and biological substances, intentional self-harm, initial encounter: Secondary | ICD-10-CM

## 2022-02-09 DIAGNOSIS — X789XXA Intentional self-harm by unspecified sharp object, initial encounter: Secondary | ICD-10-CM | POA: Diagnosis present

## 2022-02-09 LAB — URINALYSIS, ROUTINE W REFLEX MICROSCOPIC
Bilirubin Urine: NEGATIVE
Glucose, UA: NEGATIVE mg/dL
Hgb urine dipstick: NEGATIVE
Ketones, ur: NEGATIVE mg/dL
Leukocytes,Ua: NEGATIVE
Nitrite: NEGATIVE
Protein, ur: NEGATIVE mg/dL
Specific Gravity, Urine: 1.011 (ref 1.005–1.030)
pH: 7 (ref 5.0–8.0)

## 2022-02-09 LAB — CBC WITH DIFFERENTIAL/PLATELET
Abs Immature Granulocytes: 0.13 10*3/uL — ABNORMAL HIGH (ref 0.00–0.07)
Basophils Absolute: 0.1 10*3/uL (ref 0.0–0.1)
Basophils Relative: 1 %
Eosinophils Absolute: 0.3 10*3/uL (ref 0.0–0.5)
Eosinophils Relative: 3 %
HCT: 43.4 % (ref 39.0–52.0)
Hemoglobin: 14.9 g/dL (ref 13.0–17.0)
Immature Granulocytes: 2 %
Lymphocytes Relative: 26 %
Lymphs Abs: 1.9 10*3/uL (ref 0.7–4.0)
MCH: 33.4 pg (ref 26.0–34.0)
MCHC: 34.3 g/dL (ref 30.0–36.0)
MCV: 97.3 fL (ref 80.0–100.0)
Monocytes Absolute: 0.9 10*3/uL (ref 0.1–1.0)
Monocytes Relative: 12 %
Neutro Abs: 4.3 10*3/uL (ref 1.7–7.7)
Neutrophils Relative %: 56 %
Platelets: 185 10*3/uL (ref 150–400)
RBC: 4.46 MIL/uL (ref 4.22–5.81)
RDW: 12.4 % (ref 11.5–15.5)
WBC: 7.5 10*3/uL (ref 4.0–10.5)
nRBC: 0 % (ref 0.0–0.2)

## 2022-02-09 LAB — COMPREHENSIVE METABOLIC PANEL
ALT: 17 U/L (ref 0–44)
AST: 18 U/L (ref 15–41)
Albumin: 3.8 g/dL (ref 3.5–5.0)
Alkaline Phosphatase: 80 U/L (ref 38–126)
Anion gap: 5 (ref 5–15)
BUN: 10 mg/dL (ref 8–23)
CO2: 25 mmol/L (ref 22–32)
Calcium: 9 mg/dL (ref 8.9–10.3)
Chloride: 111 mmol/L (ref 98–111)
Creatinine, Ser: 0.94 mg/dL (ref 0.61–1.24)
GFR, Estimated: >60 mL/min (ref >60)
Glucose, Bld: 85 mg/dL (ref 70–99)
Potassium: 4.7 mmol/L (ref 3.5–5.1)
Sodium: 141 mmol/L (ref 135–145)
Total Bilirubin: 0.4 mg/dL (ref 0.3–1.2)
Total Protein: 6.6 g/dL (ref 6.5–8.1)

## 2022-02-09 LAB — RAPID URINE DRUG SCREEN, HOSP PERFORMED
Amphetamines: NOT DETECTED
Barbiturates: NOT DETECTED
Benzodiazepines: POSITIVE — AB
Cocaine: NOT DETECTED
Opiates: NOT DETECTED
Tetrahydrocannabinol: POSITIVE — AB

## 2022-02-09 LAB — SALICYLATE LEVEL: Salicylate Lvl: <7 mg/dL — ABNORMAL LOW (ref 7.0–30.0)

## 2022-02-09 LAB — ACETAMINOPHEN LEVEL: Acetaminophen (Tylenol), Serum: <10 ug/mL — ABNORMAL LOW (ref 10–30)

## 2022-02-09 LAB — RESP PANEL BY RT-PCR (FLU A&B, COVID) ARPGX2
Influenza A by PCR: NEGATIVE
Influenza B by PCR: NEGATIVE
SARS Coronavirus 2 by RT PCR: NEGATIVE

## 2022-02-09 LAB — ETHANOL: Alcohol, Ethyl (B): <10 mg/dL (ref <10)

## 2022-02-09 LAB — CBG MONITORING, ED: Glucose-Capillary: 94 mg/dL (ref 70–99)

## 2022-02-09 MED ORDER — DULOXETINE HCL 30 MG PO CPEP
30.0000 mg | ORAL_CAPSULE | Freq: Every day | ORAL | Status: DC
  Administered 2022-02-09 – 2022-02-10 (×2): 30 mg via ORAL
  Filled 2022-02-09 (×2): qty 1

## 2022-02-09 MED ORDER — QUETIAPINE FUMARATE 100 MG PO TABS
100.0000 mg | ORAL_TABLET | Freq: Every day | ORAL | Status: DC
  Administered 2022-02-09: 100 mg via ORAL
  Filled 2022-02-09: qty 1

## 2022-02-09 NOTE — Consult Note (Signed)
Telepsych Consultation   Reason for Consult:  Psychiatric Assessment for SI Referring Physician:  Regan Lemming, MD Location of Patient:   [facility] Location of Provider: Other: virtual home office  Patient Identification: Zachary [name] MRN:  [id number] Principal Diagnosis: MDD (major depressive disorder), recurrent episode, severe ([place]) Diagnosis:  Principal Problem:   MDD (major depressive disorder), recurrent episode, severe ([place]) Active Problems:   Alcohol abuse   Suicide and self-inflicted injury by cutting and piercing instrument ([place])   Alcohol use disorder   Alcohol-induced mood disorder with depressive symptoms ([place])   Total Time spent with patient: 30 minutes  Subjective:   Zachary [name] is a 61 y.o. male patient admitted with suicide attempt via overdose on klonopin.  HPI:   Patient seen via telepsych by this provider; chart reviewed and consulted with Dr. Dwyane Dee on 02/09/22.  On evaluation Zachary [name] is seen sitting on the bed with a blanket draped around him.  He reports mounting psychosocial stressors that seems to have culminated when he ran out of Seroquel for sleep.  Reports he's has been out of Seroquel for the past week, the inability to sleep triggered suicidal ideation for the past 2 days.  He reports he lost a friend to cancer a few days ago and states he wants to be with him.  He report he's homeless and prior to admission was living with his sister and her husband but did not like it there.  States his sister told him he had to leave today.  He reports he does not have a good relationship with his father and has limited social support. Pt endorses feeling lonely, sad and in despair.     Patient reports he was recently discharged from [facility], where he was involuntary committed for suicidal thoughts and alcoholism, about 2 weeks prior.  He also reports he cut his wrist May of this year as a suicide attempt.  At present, patient reports he stopped  drinking, has not had alcohol in the past few weeks; his UDS was positive for benzodiazepines and thc.    During evaluation Zachary [name] is seated upright on hospital gurney; he is alert/oriented x 4; anxious but cooperative; and mood congruent with affect.  Patient is speaking in a clear tone at moderate volume, and normal pace; with good eye contact.  His thought process is coherent and relevant; There is no indication that he is currently responding to internal/external stimuli or experiencing delusional thought content.  Patient denies suicidal ideation/self-harm/homicidal ideation, psychosis, and paranoia.  Patient has remained anxious but cooperative throughout assessment and has answered questions appropriately.    Past Psychiatric History: as outlined above  Risk to Self:  yes Risk to Others:  no Prior Inpatient Therapy: yes  Prior Outpatient Therapy:  yes  Past Medical History:  Past Medical History:  Diagnosis Date   Alcohol abuse    Anxiety    COPD (chronic obstructive pulmonary disease) ([place])    Depression    Hypertension    Suicidal ideation     Past Surgical History:  Procedure Laterality Date   COLONOSCOPY     MOUTH SURGERY     NO PAST SURGERIES     Family History:  Family History  Problem Relation Age of Onset   Heart attack Paternal Grandfather    Stroke Mother    Heart failure Mother    Heart failure Father    Family Psychiatric  History: deferred Social History:  Social  History   Substance and Sexual Activity  Alcohol Use Yes   Comment: BAC was .23 on admission     Social History   Substance and Sexual Activity  Drug Use Yes   Types: Marijuana   Comment: UDS was negative    Social History   Socioeconomic History   Marital status: Divorced    Spouse name: Not on file   Number of children: 2   Years of education: Not on file   Highest education level: Not on file  Occupational History   Occupation: Clinical biochemist  Tobacco Use    Smoking status: Some Days    Packs/day: 2.00    Types: Cigarettes   Smokeless tobacco: Never  Vaping Use   Vaping Use: Not on file  Substance and Sexual Activity   Alcohol use: Yes    Comment: BAC was .23 on admission   Drug use: Yes    Types: Marijuana    Comment: UDS was negative   Sexual activity: Not Currently  Other Topics Concern   Not on file  Social History Narrative   Pt stated that he lives alone; works as a Clinical biochemist   Social Determinants of Radio broadcast assistant Strain: Not on file  Food Insecurity: Not on file  Transportation Needs: Not on file  Physical Activity: Not on file  Stress: Not on file  Social Connections: Not on file   Additional Social History:    Allergies:   Allergies  Allergen Reactions   Penicillins Other (See Comments)    Childhood allergy Reaction:  Unknown  Has patient had a PCN reaction causing immediate rash, facial/tongue/throat swelling, SOB or lightheadedness with hypotension: Unknown Has patient had a PCN reaction causing severe rash involving mucus membranes or skin necrosis: Unknown Has patient had a PCN reaction that required hospitalization Unknown Has patient had a PCN reaction occurring within the last 10 years: No If all of the above answers are "NO", then may proceed with Cephalosporin use.   Statins Palpitations    Labs:  Results for orders placed or performed during the hospital encounter of 02/09/22 (from the past 48 hour(s))  CBG monitoring, ED     Status: None   Collection Time: 02/09/22  9:22 AM  Result Value Ref Range   Glucose-Capillary 94 70 - 99 mg/dL    Comment: Glucose reference range applies only to samples taken after fasting for at least 8 hours.  Urine rapid drug screen (hosp performed)     Status: Abnormal   Collection Time: 02/09/22  9:24 AM  Result Value Ref Range   Opiates NONE DETECTED NONE DETECTED   Cocaine NONE DETECTED NONE DETECTED   Benzodiazepines POSITIVE (A) NONE  DETECTED   Amphetamines NONE DETECTED NONE DETECTED   Tetrahydrocannabinol POSITIVE (A) NONE DETECTED   Barbiturates NONE DETECTED NONE DETECTED    Comment: (NOTE) DRUG SCREEN FOR MEDICAL PURPOSES ONLY.  IF CONFIRMATION IS NEEDED FOR ANY PURPOSE, NOTIFY LAB WITHIN 5 DAYS.  LOWEST DETECTABLE LIMITS FOR URINE DRUG SCREEN Drug Class                     Cutoff (ng/mL) Amphetamine and metabolites    1000 Barbiturate and metabolites    200 Benzodiazepine                 200 Opiates and metabolites        300 Cocaine and metabolites        300 THC  50 Performed at [facility], [place] [address], [place], [place] [address]   Urinalysis, Routine w reflex microscopic Anterior Nasal Swab     Status: None   Collection Time: 02/09/22  9:24 AM  Result Value Ref Range   Color, Urine YELLOW YELLOW   APPearance CLEAR CLEAR   Specific Gravity, Urine 1.011 1.005 - 1.030   pH 7.0 5.0 - 8.0   Glucose, UA NEGATIVE NEGATIVE mg/dL   Hgb urine dipstick NEGATIVE NEGATIVE   Bilirubin Urine NEGATIVE NEGATIVE   Ketones, ur NEGATIVE NEGATIVE mg/dL   Protein, ur NEGATIVE NEGATIVE mg/dL   Nitrite NEGATIVE NEGATIVE   Leukocytes,Ua NEGATIVE NEGATIVE    Comment: Performed at [facility], [place] [address], [place], [place] [address]  Comprehensive metabolic panel     Status: None   Collection Time: 02/09/22  9:46 AM  Result Value Ref Range   Sodium 141 135 - 145 mmol/L   Potassium 4.7 3.5 - 5.1 mmol/L   Chloride 111 98 - 111 mmol/L   CO2 25 22 - 32 mmol/L   Glucose, Bld 85 70 - 99 mg/dL    Comment: Glucose reference range applies only to samples taken after fasting for at least 8 hours.   BUN 10 8 - 23 mg/dL   Creatinine, Ser 0.94 0.61 - 1.24 mg/dL   Calcium 9.0 8.9 - 10.3 mg/dL   Total Protein 6.6 6.5 - 8.1 g/dL   Albumin 3.8 3.5 - 5.0 g/dL   AST 18 15 - 41 U/L   ALT 17 0 - 44 U/L   Alkaline Phosphatase 80 38 - 126 U/L   Total Bilirubin  0.4 0.3 - 1.2 mg/dL   GFR, Estimated >60 >60 mL/min    Comment: (NOTE) Calculated using the CKD-EPI Creatinine Equation (2021)    Anion gap 5 5 - 15    Comment: Performed at [facility], [place] [address] [place], [place] [id number]  Salicylate level     Status: Abnormal   Collection Time: 02/09/22  9:46 AM  Result Value Ref Range   Salicylate Lvl Q000111Q (L) 7.0 - 30.0 mg/dL    Comment: Performed at [facility], [place] [address] [place], [place] [address]  Acetaminophen level     Status: Abnormal   Collection Time: 02/09/22  9:46 AM  Result Value Ref Range   Acetaminophen (Tylenol), Serum <10 (L) 10 - 30 ug/mL    Comment: (NOTE) Therapeutic concentrations vary significantly. A range of 10-30 ug/mL  may be an effective concentration for many patients. However, some  are best treated at concentrations outside of this range. Acetaminophen concentrations >150 ug/mL at 4 hours after ingestion  and >50 ug/mL at 12 hours after ingestion are often associated with  toxic reactions.  Performed at [facility], [place] [address] [place], [place] [address]   Ethanol     Status: None   Collection Time: 02/09/22  9:46 AM  Result Value Ref Range   Alcohol, Ethyl (B) <10 <10 mg/dL    Comment: (NOTE) Lowest detectable limit for serum alcohol is 10 mg/dL.  For medical purposes only. Performed at [facility], [place] [address], [place], [place] [address]   CBC with Diff     Status: Abnormal   Collection Time: 02/09/22  9:46 AM  Result Value Ref Range   WBC 7.5 4.0 - 10.5 K/uL   RBC 4.46 4.22 - 5.81 MIL/uL   Hemoglobin 14.9 13.0 -  17.0 g/dL   HCT 43.4 39.0 - 52.0 %   MCV 97.3 80.0 - 100.0 fL   MCH 33.4 26.0 - 34.0 pg   MCHC 34.3 30.0 - 36.0 g/dL   RDW 12.4 11.5 - 15.5 %   Platelets 185 150 - 400 K/uL   nRBC 0.0 0.0 - 0.2 %   Neutrophils Relative % 56 %   Neutro Abs 4.3 1.7 - 7.7 K/uL   Lymphocytes Relative 26 %    Lymphs Abs 1.9 0.7 - 4.0 K/uL   Monocytes Relative 12 %   Monocytes Absolute 0.9 0.1 - 1.0 K/uL   Eosinophils Relative 3 %   Eosinophils Absolute 0.3 0.0 - 0.5 K/uL   Basophils Relative 1 %   Basophils Absolute 0.1 0.0 - 0.1 K/uL   Immature Granulocytes 2 %   Abs Immature Granulocytes 0.13 (H) 0.00 - 0.07 K/uL    Comment: Performed at [facility], [place] [address], [place], [place] [address]  Resp Panel by RT-PCR (Flu A&B, Covid) Anterior Nasal Swab     Status: None   Collection Time: 02/09/22  9:55 AM   Specimen: Anterior Nasal Swab  Result Value Ref Range   SARS Coronavirus 2 by RT PCR NEGATIVE NEGATIVE    Comment: (NOTE) SARS-CoV-2 target nucleic acids are NOT DETECTED.  The SARS-CoV-2 RNA is generally detectable in upper respiratory specimens during the acute phase of infection. The lowest concentration of SARS-CoV-2 viral copies this assay can detect is 138 copies/mL. A negative result does not preclude SARS-Cov-2 infection and should not be used as the sole basis for treatment or other patient management decisions. A negative result may occur with  improper specimen collection/handling, submission of specimen other than nasopharyngeal swab, presence of viral mutation(s) within the areas targeted by this assay, and inadequate number of viral copies(<138 copies/mL). A negative result must be combined with clinical observations, patient history, and epidemiological information. The expected result is Negative.  Fact Sheet for Patients:  EntrepreneurPulse.com.au  Fact Sheet for Healthcare Providers:  IncredibleEmployment.be  This test is no t yet approved or cleared by the [place] FDA and  has been authorized for detection and/or diagnosis of SARS-CoV-2 by FDA under an Emergency Use Authorization (EUA). This EUA will remain  in effect (meaning this test can be used) for the duration of the COVID-19 declaration under  Section 564(b)(1) of the Act, 21 U.S.C.section 360bbb-3(b)(1), unless the authorization is terminated  or revoked sooner.       Influenza A by PCR NEGATIVE NEGATIVE   Influenza B by PCR NEGATIVE NEGATIVE    Comment: (NOTE) The Xpert Xpress SARS-CoV-2/FLU/RSV plus assay is intended as an aid in the diagnosis of influenza from Nasopharyngeal swab specimens and should not be used as a sole basis for treatment. Nasal washings and aspirates are unacceptable for Xpert Xpress SARS-CoV-2/FLU/RSV testing.  Fact Sheet for Patients: EntrepreneurPulse.com.au  Fact Sheet for Healthcare Providers: IncredibleEmployment.be  This test is not yet approved or cleared by the [place] FDA and has been authorized for detection and/or diagnosis of SARS-CoV-2 by FDA under an Emergency Use Authorization (EUA). This EUA will remain in effect (meaning this test can be used) for the duration of the COVID-19 declaration under Section 564(b)(1) of the Act, 21 U.S.C. section 360bbb-3(b)(1), unless the authorization is terminated or revoked.  Performed at [facility], [place] [address] [place], [place] [address]     Medications:  Current Facility-Administered Medications  Medication Dose Route Frequency  Provider Last Rate Last Admin   DULoxetine (CYMBALTA) DR capsule 30 mg  30 mg Oral Daily Ernie Avena, MD   30 mg at 02/09/22 1331   QUEtiapine (SEROQUEL) tablet 100 mg  100 mg Oral QHS Ernie Avena, MD   100 mg at 02/09/22 2156   Current Outpatient Medications  Medication Sig Dispense Refill   Calcium Carbonate Antacid (ALKA-SELTZER ANTACID PO) Take 2 packets by mouth as needed (heartburn).     clonazePAM (KLONOPIN) 1 MG tablet Take 1 mg by mouth in the morning, at noon, and at bedtime.     DULoxetine (CYMBALTA) 30 MG capsule Take 30 mg by mouth daily.     QUEtiapine (SEROQUEL) 100 MG tablet Take 1 tablet (100 mg total) by mouth at bedtime.  30 tablet 0   EPINEPHrine 0.3 mg/0.3 mL IJ SOAJ injection Inject 0.3 mg into the muscle as needed for anaphylaxis. (Patient not taking: Reported on 11/28/2021) 1 each 0   gabapentin (NEURONTIN) 300 MG capsule Take 1 capsule (300 mg total) by mouth 3 (three) times daily. (Patient not taking: Reported on 01/02/2022) 90 capsule 0   lisinopril-hydrochlorothiazide (ZESTORETIC) 10-12.5 MG tablet Take 1 tablet by mouth daily. (Patient not taking: Reported on 01/02/2022)     naltrexone (DEPADE) 50 MG tablet Take 50 mg by mouth daily. (Patient not taking: Reported on 01/02/2022)     omeprazole (PRILOSEC) 20 MG capsule Take 1 capsule (20 mg total) by mouth daily. (Patient not taking: Reported on 11/28/2021) 30 capsule 0   sucralfate (CARAFATE) 1 g tablet Take 1 tablet (1 g total) by mouth 4 (four) times daily -  with meals and at bedtime. (Patient not taking: Reported on 11/28/2021) 60 tablet 0    Musculoskeletal: pt moves all extremities and ambulates independently Strength & Muscle Tone: within normal limits Gait & Station: normal Patient leans: N/A  Psychiatric Specialty Exam:  Presentation  General Appearance:  Appropriate for Environment  Eye Contact: Good  Speech: Clear and Coherent  Speech Volume: Decreased  Handedness: Right   Mood and Affect  Mood: Anxious; Depressed; Dysphoric; Worthless; Labile; Hopeless  Affect: Congruent; Depressed   Thought Process  Thought Processes: Coherent  Descriptions of Associations:Intact  Orientation:Full (Time, Place and Person)  Thought Content:Illogical  History of Schizophrenia/Schizoaffective disorder:No  Duration of Psychotic Symptoms:No data recorded Hallucinations:Hallucinations: None  Ideas of Reference:None  Suicidal Thoughts:Suicidal Thoughts: Yes, Active SI Active Intent and/or Plan: With Intent; With Plan  Homicidal Thoughts:Homicidal Thoughts: No   Sensorium  Memory: Immediate Good; Recent Good; Remote  Good  Judgment: Poor  Insight: Lacking   Executive Functions  Concentration: Fair  Attention Span: Fair  Recall: Good  Fund of Knowledge: Fair  Language: Good   Psychomotor Activity  Psychomotor Activity: Psychomotor Activity: Normal   Assets  Assets: Communication Skills; Desire for Improvement   Sleep  Sleep: Sleep: Poor Number of Hours of Sleep: 3    Physical Exam: Physical Exam Constitutional:      Appearance: Normal appearance.  Cardiovascular:     Rate and Rhythm: Normal rate.  Pulmonary:     Effort: Pulmonary effort is normal.  Musculoskeletal:        General: Normal range of motion.     Cervical back: Normal range of motion.  Neurological:     Mental Status: He is alert and oriented to person, place, and time. Mental status is at baseline.  Psychiatric:        Attention and Perception: Attention and perception normal.  Mood and Affect: Mood is anxious and depressed. Affect is labile.        Speech: Speech normal.        Behavior: Behavior is cooperative.        Thought Content: Thought content includes suicidal ideation. Thought content includes suicidal plan.        Cognition and Memory: Cognition and memory normal.        Judgment: Judgment is impulsive.    Review of Systems  Constitutional: Negative.   HENT: Negative.    Eyes: Negative.   Respiratory: Negative.    Cardiovascular: Negative.   Gastrointestinal: Negative.   Genitourinary: Negative.   Musculoskeletal: Negative.   Skin: Negative.   Neurological: Negative.   Endo/Heme/Allergies: Negative.   Psychiatric/Behavioral:  Positive for depression and suicidal ideas. Negative for hallucinations and memory loss. The patient is nervous/anxious and has insomnia.    Blood pressure 138/79, pulse (!) 117, temperature 98.3 F (36.8 C), temperature source Oral, resp. rate 18, height 5\' 9"  (1.753 m), weight 77.1 kg, SpO2 98 %. Body mass index is 25.1 kg/m.  Treatment Plan  Summary: Pt with mounting psychosocial stressors to include recent personal loss, homelessness and polysubstance abuse.  Presented to the emergency department with reported suicide attempt via ingestion.  Pt reports being triggered when he ran out of seroquel and had difficulty sleeping.  Apparently took more klonopin than needed but states he was anxious and needed to sleep. On admission, he reported this as a suicide attempt but now currently denies this was an attempt to end his life.  However, at present, he is very anxious, and endorses feeling of hopelessness and despair since he has not been able to sleep.  He cannot contract for safety.  Given this, discussed restarting his cymbalta for mood stability and seroquel for sleep; and AM psychiatric reassessment.  Patient has hx of multiple emergency room visits with similar presentation; 3 inpatient psychiatric hospitalizations within the past 11 months, most recent was approximately 2 weeks ago where was hospitalized via IVC at [facility] for suicidal ideation.  Pt has ineffective coping skills and his chronic polysubstance use complicates his presentation.  Given this, not sure that he would benefit from another psychiatric admission.   Since he was triggered by inability to sleep, will see if his symptoms improves with restarting Seroquel.  If so, he may be able to be discharged to [facility].   Above was discussed with patient who agrees with the plan of care.  Pt has already been medically cleared and home medications were restarted.  EKG WNL for psych meds.   Disposition: Recommend restarting psychiatric medications and overnight observation and AM psych reassessment.    This service was provided via telemedicine using a 2-way, interactive audio and video technology.  Names of all persons participating in this telemedicine service and their role in this encounter. Name: Malykai Schommer Role: Patient  Name: Merlyn Lot Role: [place]  Name: Hampton Abbot  Role: Psychiatrist   Mallie Darting, NP 02/09/2022 10:29 PM

## 2022-02-09 NOTE — ED Notes (Signed)
Pt slightly restless, but calm during this shift.

## 2022-02-09 NOTE — ED Provider Notes (Signed)
[facility]-EMERGENCY DEPT Provider Note   CSN: [id number] Arrival date & time: 02/09/22  7322     History  Chief Complaint  Patient presents with   Suicide Attempt    Zachary [name] is a 61 y.o. male.  HPI   61 year old male with medical history significant for HTN, alcohol abuse, COPD, depression, anxiety, suicidal ideation, recently discharged [facility] who presents to the emergency department after an intentional overdose.  The patient states that he took 12 tablets 1mg  Klonopin this morning between 4 and just before EMS picked him up.  He called EMS.  He states that he took the Klonopin with an intention to end his life.  He states "I called EMS because I do not really want to die but I cannot keep living like this."  He then continued to endorse SI.  No HI or AVH.  He arrived to the emergency department GCS 15, ABC intact.  Home Medications Prior to Admission medications   Medication Sig Start Date End Date Taking? Authorizing Provider  Calcium Carbonate Antacid (ALKA-SELTZER ANTACID PO) Take 2 packets by mouth as needed (heartburn).   Yes [provider]  clonazePAM (KLONOPIN) 1 MG tablet Take 1 mg by mouth in the morning, at noon, and at bedtime. 02/01/22  Yes [provider]  DULoxetine (CYMBALTA) 30 MG capsule Take 30 mg by mouth daily. 10/05/21  Yes [provider]  QUEtiapine (SEROQUEL) 100 MG tablet Take 1 tablet (100 mg total) by mouth at bedtime. 12/01/21  Yes 12/03/21 A, NP  EPINEPHrine 0.3 mg/0.3 mL IJ SOAJ injection Inject 0.3 mg into the muscle as needed for anaphylaxis. Patient not taking: Reported on 11/28/2021 10/31/21   11/02/21, MD  gabapentin (NEURONTIN) 300 MG capsule Take 1 capsule (300 mg total) by mouth 3 (three) times daily. Patient not taking: Reported on 01/02/2022 09/09/21 01/02/22  01/04/22, MD  lisinopril-hydrochlorothiazide (ZESTORETIC) 10-12.5 MG tablet Take 1 tablet by  mouth daily. Patient not taking: Reported on 01/02/2022    [provider]  naltrexone (DEPADE) 50 MG tablet Take 50 mg by mouth daily. Patient not taking: Reported on 01/02/2022 10/14/21   [provider]  omeprazole (PRILOSEC) 20 MG capsule Take 1 capsule (20 mg total) by mouth daily. Patient not taking: Reported on 11/28/2021 10/31/21   11/02/21, PA-C  sucralfate (CARAFATE) 1 g tablet Take 1 tablet (1 g total) by mouth 4 (four) times daily -  with meals and at bedtime. Patient not taking: Reported on 11/28/2021 10/31/21   11/02/21, PA-C      Allergies    Penicillins and Statins    Review of Systems   Review of Systems  All other systems reviewed and are negative.   Physical Exam Updated Vital Signs BP (!) 105/52 (BP Location: Left Arm)   Pulse (!) 50   Temp 97.6 F (36.4 C) (Oral)   Resp 18   Ht 5\' 9"  (1.753 m)   Wt 77.1 kg   SpO2 94%   BMI 25.10 kg/m  Physical Exam Vitals and nursing note reviewed.  Constitutional:      General: He is not in acute distress. HENT:     Head: Normocephalic and atraumatic.  Eyes:     Conjunctiva/sclera: Conjunctivae normal.     Pupils: Pupils are equal, round, and reactive to light.  Cardiovascular:     Rate and Rhythm: Regular rhythm. Bradycardia present.  Pulmonary:  Effort: Pulmonary effort is normal. No respiratory distress.  Abdominal:     General: There is no distension.     Tenderness: There is no guarding.  Musculoskeletal:        General: No deformity or signs of injury.     Cervical back: Neck supple.  Skin:    Findings: No lesion or rash.  Neurological:     General: No focal deficit present.     Mental Status: He is alert. Mental status is at baseline.     ED Results / Procedures / Treatments   Labs (all labs ordered are listed, but only abnormal results are displayed) Labs Reviewed  SALICYLATE LEVEL - Abnormal; Notable for the following components:      Result Value    Salicylate Lvl <7.0 (*)    All other components within normal limits  ACETAMINOPHEN LEVEL - Abnormal; Notable for the following components:   Acetaminophen (Tylenol), Serum <10 (*)    All other components within normal limits  RAPID URINE DRUG SCREEN, HOSP PERFORMED - Abnormal; Notable for the following components:   Benzodiazepines POSITIVE (*)    Tetrahydrocannabinol POSITIVE (*)    All other components within normal limits  CBC WITH DIFFERENTIAL/PLATELET - Abnormal; Notable for the following components:   Abs Immature Granulocytes 0.13 (*)    All other components within normal limits  RESP PANEL BY RT-PCR (FLU A&B, COVID) [id number]  COMPREHENSIVE METABOLIC PANEL  ETHANOL  URINALYSIS, ROUTINE W REFLEX MICROSCOPIC  CBG MONITORING, ED    EKG EKG Interpretation  Date/Time:  Wednesday February 09 2022 09:19:18 EST Ventricular Rate:  49 PR Interval:  144 QRS Duration: 110 QT Interval:  419 QTC Calculation: 379 R Axis:   -61 Text Interpretation: Sinus bradycardia Atrial premature complex Left axis deviation wandering baseline, no STEMI Confirmed by Ernie Avena ([id number]) on 02/09/2022 9:42:32 AM  Radiology No results found.  Procedures Procedures    Medications Ordered in ED Medications  DULoxetine (CYMBALTA) DR capsule 30 mg (has no administration in time range)  QUEtiapine (SEROQUEL) tablet 100 mg (has no administration in time range)    ED Course/ Medical Decision Making/ A&P Clinical Course as of 02/09/22 1309  Wed Feb 09, 2022  1044 Benzodiazepines(!): POSITIVE [JL]  1044 Tetrahydrocannabinol(!): POSITIVE [JL]    Clinical Course User Index [JL] Ernie Avena, MD                           Medical Decision Making Amount and/or Complexity of Data Reviewed Labs: ordered. Decision-making details documented in ED Course.    61 year old male with medical history significant for HTN, alcohol abuse, COPD, depression, anxiety, suicidal ideation, recently [facility] who presents to the emergency department after an intentional overdose.  The patient states that he took 12 tablets 1mg  Klonopin this morning between 4 and just before EMS picked him up.  He called EMS.  He states that he took the Klonopin with an intention to end his life.  He states "I called EMS because I do not really want to die but I cannot keep living like this."  He then continued to endorse SI.  No HI or AVH.  He arrived to the emergency department GCS 15, ABC intact.  Poison Control: Observe for 4 hours from time of arrival for medical clearance. Co-ingestion labs collected.  Laboratory evaluation significant for 4-hour Tylenol level negative, salicylate level negative, ethanol level negative, COVID-19 influenza PCR testing negative,  CBC unremarkable, UDS positive for benzodiazepines and THC, urinalysis unremarkable, CBG 94.  An EKG was performed which revealed no abnormal intervals.  Patient was observed in the emergency department for 4 hours on end-tidal CO2 monitoring.  He remained mildly somnolent but alert and easily arousable, saturating well on room air.  At this time feel the patient is medically clear for psychiatric consultation.  TTS consult placed.  Home medications reordered.  The patient remains voluntary for consultation.  Final Clinical Impression(s) / ED Diagnoses Final diagnoses:  Suicide attempt [facility])  Intentional overdose, initial encounter [facility])    Rx / DC Orders ED Discharge Orders     None         Ernie Avena, MD 02/09/22 1310

## 2022-02-09 NOTE — ED Notes (Signed)
Pt arrived on unit A&Ox4 and ambulatory w/ a steady gait.  Pt provided w/ a lunch tray.

## 2022-02-09 NOTE — ED Triage Notes (Signed)
Pt BIB EMS from home with c/o of SI. Pt took 12 Klonopin since 0400. Pt was released from [facility] last Thursday and was given a prescription of Klonopin from there.

## 2022-02-09 NOTE — ED Notes (Signed)
Pt belongings placed in locker #28.  

## 2022-02-10 ENCOUNTER — Other Ambulatory Visit: Payer: Self-pay

## 2022-02-10 ENCOUNTER — Encounter (HOSPITAL_COMMUNITY): Payer: Self-pay | Admitting: Psychiatry

## 2022-02-10 ENCOUNTER — Inpatient Hospital Stay (HOSPITAL_COMMUNITY)
Admission: AD | Admit: 2022-02-10 | Discharge: 2022-02-15 | DRG: 885 | Disposition: A | Discharge status: Home / Self Care | Payer: Medicaid Other | Source: Intra-hospital | Attending: Psychiatry | Admitting: Psychiatry

## 2022-02-10 DIAGNOSIS — Z79899 Other long term (current) drug therapy: Secondary | ICD-10-CM

## 2022-02-10 DIAGNOSIS — Z87891 Personal history of nicotine dependence: Secondary | ICD-10-CM

## 2022-02-10 DIAGNOSIS — I10 Essential (primary) hypertension: Secondary | ICD-10-CM | POA: Diagnosis present

## 2022-02-10 DIAGNOSIS — F331 Major depressive disorder, recurrent, moderate: Secondary | ICD-10-CM | POA: Diagnosis not present

## 2022-02-10 DIAGNOSIS — F1024 Alcohol dependence with alcohol-induced mood disorder: Secondary | ICD-10-CM | POA: Diagnosis present

## 2022-02-10 DIAGNOSIS — Z23 Encounter for immunization: Secondary | ICD-10-CM | POA: Diagnosis not present

## 2022-02-10 DIAGNOSIS — K5900 Constipation, unspecified: Secondary | ICD-10-CM | POA: Diagnosis present

## 2022-02-10 DIAGNOSIS — Z9151 Personal history of suicidal behavior: Secondary | ICD-10-CM | POA: Diagnosis not present

## 2022-02-10 DIAGNOSIS — T424X2A Poisoning by benzodiazepines, intentional self-harm, initial encounter: Secondary | ICD-10-CM | POA: Diagnosis present

## 2022-02-10 DIAGNOSIS — Z5900 Homelessness unspecified: Secondary | ICD-10-CM

## 2022-02-10 DIAGNOSIS — T50902A Poisoning by unspecified drugs, medicaments and biological substances, intentional self-harm, initial encounter: Secondary | ICD-10-CM | POA: Diagnosis not present

## 2022-02-10 DIAGNOSIS — K30 Functional dyspepsia: Secondary | ICD-10-CM | POA: Diagnosis present

## 2022-02-10 DIAGNOSIS — F1010 Alcohol abuse, uncomplicated: Secondary | ICD-10-CM | POA: Diagnosis present

## 2022-02-10 DIAGNOSIS — F332 Major depressive disorder, recurrent severe without psychotic features: Secondary | ICD-10-CM | POA: Diagnosis present

## 2022-02-10 DIAGNOSIS — F411 Generalized anxiety disorder: Secondary | ICD-10-CM | POA: Diagnosis present

## 2022-02-10 DIAGNOSIS — Z20822 Contact with and (suspected) exposure to covid-19: Secondary | ICD-10-CM | POA: Diagnosis present

## 2022-02-10 DIAGNOSIS — G4700 Insomnia, unspecified: Secondary | ICD-10-CM | POA: Diagnosis present

## 2022-02-10 DIAGNOSIS — J449 Chronic obstructive pulmonary disease, unspecified: Secondary | ICD-10-CM | POA: Diagnosis present

## 2022-02-10 LAB — RAPID URINE DRUG SCREEN, HOSP PERFORMED
Amphetamines: NOT DETECTED
Barbiturates: NOT DETECTED
Benzodiazepines: POSITIVE — AB
Cocaine: NOT DETECTED
Opiates: NOT DETECTED
Tetrahydrocannabinol: POSITIVE — AB

## 2022-02-10 LAB — URINALYSIS, COMPLETE (UACMP) WITH MICROSCOPIC
Bacteria, UA: NONE SEEN
Bilirubin Urine: NEGATIVE
Glucose, UA: NEGATIVE mg/dL
Hgb urine dipstick: NEGATIVE
Ketones, ur: NEGATIVE mg/dL
Leukocytes,Ua: NEGATIVE
Nitrite: NEGATIVE
Protein, ur: NEGATIVE mg/dL
Specific Gravity, Urine: 1.016 (ref 1.005–1.030)
pH: 6 (ref 5.0–8.0)

## 2022-02-10 MED ORDER — HYDROXYZINE HCL 25 MG PO TABS
25.0000 mg | ORAL_TABLET | Freq: Three times a day (TID) | ORAL | Status: DC | PRN
  Administered 2022-02-10 – 2022-02-11 (×2): 25 mg via ORAL
  Filled 2022-02-10 (×2): qty 1

## 2022-02-10 MED ORDER — LORAZEPAM 1 MG PO TABS
1.0000 mg | ORAL_TABLET | ORAL | Status: AC | PRN
  Administered 2022-02-10: 1 mg via ORAL
  Filled 2022-02-10: qty 1

## 2022-02-10 MED ORDER — ACETAMINOPHEN 325 MG PO TABS: 650.0000 mg | ORAL_TABLET | Freq: Four times a day (QID) | ORAL | Status: DC | PRN

## 2022-02-10 MED ORDER — TRAZODONE HCL 50 MG PO TABS
50.0000 mg | ORAL_TABLET | Freq: Every evening | ORAL | Status: DC | PRN
  Administered 2022-02-11: 50 mg via ORAL

## 2022-02-10 MED ORDER — PNEUMOCOCCAL 20-VAL CONJ VACC 0.5 ML IM SUSY
0.5000 mL | PREFILLED_SYRINGE | INTRAMUSCULAR | Status: AC
  Administered 2022-02-11: 0.5 mL via INTRAMUSCULAR
  Filled 2022-02-10: qty 0.5

## 2022-02-10 MED ORDER — HYDROXYZINE HCL 25 MG PO TABS
25.0000 mg | ORAL_TABLET | Freq: Three times a day (TID) | ORAL | Status: DC | PRN
  Administered 2022-02-10 (×2): 25 mg via ORAL
  Filled 2022-02-10 (×2): qty 1

## 2022-02-10 MED ORDER — MAGNESIUM HYDROXIDE 400 MG/5ML PO SUSP: 30.0000 mL | Freq: Every day | ORAL | Status: DC | PRN

## 2022-02-10 MED ORDER — ACETAMINOPHEN 325 MG PO TABS
650.0000 mg | ORAL_TABLET | Freq: Four times a day (QID) | ORAL | Status: DC | PRN
  Administered 2022-02-10: 650 mg via ORAL
  Filled 2022-02-10: qty 2

## 2022-02-10 MED ORDER — ALUM & MAG HYDROXIDE-SIMETH 200-200-20 MG/5ML PO SUSP: 30.0000 mL | ORAL | Status: DC | PRN

## 2022-02-10 MED ORDER — INFLUENZA VAC SPLIT QUAD 0.5 ML IM SUSY
0.5000 mL | PREFILLED_SYRINGE | INTRAMUSCULAR | Status: AC
  Administered 2022-02-11: 0.5 mL via INTRAMUSCULAR
  Filled 2022-02-10: qty 0.5

## 2022-02-10 MED ORDER — ZIPRASIDONE MESYLATE 20 MG IM SOLR: 20.0000 mg | Freq: Two times a day (BID) | INTRAMUSCULAR | Status: DC | PRN

## 2022-02-10 NOTE — ED Provider Notes (Signed)
Emergency Medicine Observation Re-evaluation Note  Zachary [name] is a 61 y.o. male, seen on rounds today.  Pt initially presented to the ED for complaints of Suicide Attempt Endorsed intentional Klonopin overdose.  Now medically cleared Currently, the patient is resting comfortably no acute distress.  Physical Exam  BP 124/75 (BP Location: Right Arm)   Pulse 62   Temp 98.5 F (36.9 C) (Oral)   Resp 18   Ht 5\' 9"  (1.753 m)   Wt 77.1 kg   SpO2 96%   BMI 25.10 kg/m  Physical Exam General: Appears to be resting comfortably in bed, no acute distress. Cardiac: Regular rate, normal heart rate, non-emergent blood pressure for this morning's vitals. Lungs: No increased work of breathing.  Equal chest rise appreciated Psych: Calm, asleep in bed.   ED Course / MDM  EKG:EKG Interpretation  Date/Time:  Wednesday February 09 2022 09:19:18 EST Ventricular Rate:  49 PR Interval:  144 QRS Duration: 110 QT Interval:  419 QTC Calculation: 379 R Axis:   -61 Text Interpretation: Sinus bradycardia Atrial premature complex Left axis deviation wandering baseline, no STEMI Confirmed by [id number] ([id number]) on 02/09/2022 9:42:32 AM  I have reviewed the labs performed to date as well as medications administered while in observation.    Plan  Current plan is for Reassessment this morning.  He had a recent old 02/11/2022 admission without psychiatric benefit.  He declined suicidal ideation to the psychiatric NP per the chart.  Will be reassessed this morning with ultimate disposition pending Psychiatric team.    [place], MD 02/10/22 02/12/22

## 2022-02-10 NOTE — ED Notes (Signed)
[place] remains asleep no signs of distress  noted respirations are easy skin color is appropriate for ethnicity.

## 2022-02-10 NOTE — ED Notes (Signed)
Patient off unit to [facility] per provider. Patient alert, calm, cooperative, no s/s of distress. Discharge information and belongings given to [facility] for transport. Patient ambulatory off unit, escorted by RN. Patient transported by [employer].

## 2022-02-10 NOTE — Progress Notes (Signed)
   02/10/22 2300  Psych Admission Type (Psych Patients Only)  Admission Status Voluntary  Psychosocial Assessment  Patient Complaints Anxiety;Depression;Helplessness;Hopelessness;Sadness  Eye Contact Fair  Facial Expression Sad  Affect Depressed;Sad  Speech Logical/coherent  Interaction Assertive  Motor Activity Fidgety  Appearance/Hygiene In scrubs  Behavior Characteristics Anxious  Mood Anxious;Depressed  Thought Process  Coherency WDL  Content WDL  Delusions None reported or observed  Perception WDL  Hallucination None reported or observed  Judgment Limited  Confusion Mild  Danger to Self  Current suicidal ideation? Denies  Danger to Others  Danger to Others None reported or observed

## 2022-02-10 NOTE — Progress Notes (Addendum)
[facility] Admission Note:  Patient admitted to [facility] on 02/10/22. Pt denies SI/HI/AVH during admission but states "I do have thoughts of hurting myself sometimes but I don't right now". Pt verbally contracted for safety. 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 Pt reports that he is in a cycle where he is fine for awhile and then his pharmacy does not fill his medications on time which leads to him not being able to sleep, causing him to feel anxious and spiral and begin to binge drink to self- medicate. Pt reports that he quit smoking tobacco this year and refuses nicotine replacement therapy. Pt reports that he does not drink regularly, only when he begins to feel anxious, and then he begins to binge drink. Pt reports occasional CBD use. Pt reports a health history of COPD. Pt oriented to unit and provided with toiletries and towels. RN provided pt with a bible and collected urine sample from pt. Pt is safe on the unit at this time.

## 2022-02-10 NOTE — Progress Notes (Signed)
[facility] Psych ED Progress Note  02/10/2022 10:50 AM Zachary [name]  MRN:  [id number]    Principal Problem: MDD (major depressive disorder), recurrent episode, severe ([place])  Diagnosis:  Principal Problem:   MDD (major depressive disorder), recurrent episode, severe ([place])   ED Assessment Time Calculation: Start Time: 1000 Stop Time: 1030 Total Time in Minutes (Assessment Completion): 30  Subjective: Patient seen face to face by this provider, chart reviewed and consulted with Dr. Dwyane Dee on 02/10/22. Zachary [name] is a 61 y.o male patient admitted with suicide attempt via overdose on Klonopin and fight with sister and her husband. Patient is sitting on his bed, in no apparent distress. He states that he lives with sister and her husband, with whom he got into a verbal altercation with due to him sleeping in a cold attic and they are below him, watching television all night. Patient states he is "tired of life and he doesn't want to be here". Patient states he doesn't get a lot of sleep due to mind racing all night, he can not recall how many hours a night he gets. He states his appetite is not great, he has to make himself eat. He discussed the best time, when his mental health was good was when he had a psychiatrist who prescribed him Klonopin 4 times a day and he was able to rest and his mind was "clear", but states that psychiatrist has permanently closed his office and he says since then he has been in and out of psychiatric EDs and inpatient facilities. Patient endorses SI without a plan but states that if I am discharged, "I am a danger to myself out there, I will find a way, I'm tired". Patient denied HI/AVH, and states he smokes CBD and drinks alcohol off and on when he is anxious. Patient also stated that once when he took Clonidine he did begin hearing voices, but it was only once. Patient states he wants long term help, he has been to [facility], [facility], [place]  with in the past few months and he felt it was not long enough. Patient is adamant about not returning to his sisters home whom he resides with.   Patient is alert and oriented is alert, oriented x 4, calm, cooperative and attentive. His mood is anxious and congruent affect. He has normal speech, and behavior.  Objectively there is no evidence of psychosis/mania or delusional thinking.  Patient is able to converse coherently, goal directed thoughts, no distractibility, or pre-occupation.    He endorses suicidal/self-harm but denies homicidal ideation, psychosis, and paranoia. Patient answered question appropriately.    Past Psychiatric History: Numerous ED visits for depression and SI, patient recently has been to [facility] for psychiatric evaluation and [place].   [place] Scale:  [place] ED from 02/09/2022 in [place] DEPT ED from 01/02/2022 in [place] DEPT ED from 11/28/2021 in [place] DEPT  C-SSRS RISK CATEGORY High Risk High Risk High Risk       Past Medical History:  Past Medical History:  Diagnosis Date   Alcohol abuse    Anxiety    COPD (chronic obstructive pulmonary disease) ([place])    Depression    Hypertension    Suicidal ideation     Past Surgical History:  Procedure Laterality Date   COLONOSCOPY     MOUTH SURGERY     NO PAST SURGERIES     Family History:  Family History  Problem Relation Age of Onset   Heart attack Paternal Grandfather    Stroke Mother    Heart failure Mother    Heart failure Father     Social History:  Social History   Substance and Sexual Activity  Alcohol Use Yes   Comment: BAC was .23 on admission     Social History   Substance and Sexual Activity  Drug Use Yes   Types: Marijuana   Comment: UDS was negative    Social History   Socioeconomic History   Marital status: Divorced    Spouse name: Not on file   Number of  children: 2   Years of education: Not on file   Highest education level: Not on file  Occupational History   Occupation: Product/process development scientist  Tobacco Use   Smoking status: Some Days    Packs/day: 2.00    Types: Cigarettes   Smokeless tobacco: Never  Vaping Use   Vaping Use: Not on file  Substance and Sexual Activity   Alcohol use: Yes    Comment: BAC was .23 on admission   Drug use: Yes    Types: Marijuana    Comment: UDS was negative   Sexual activity: Not Currently  Other Topics Concern   Not on file  Social History Narrative   Pt stated that he lives alone; works as a Product/process development scientist   Social Determinants of Corporate investment banker Strain: Not on Ship broker Insecurity: Not on file  Transportation Needs: Not on file  Physical Activity: Not on file  Stress: Not on file  Social Connections: Not on file    Sleep: Poor  Appetite:  Fair  Current Medications: Current Facility-Administered Medications  Medication Dose Route Frequency Provider Last Rate Last Admin   acetaminophen (TYLENOL) tablet 650 mg  650 mg Oral Q6H PRN Glyn Ade, MD   650 mg at 02/10/22 1005   DULoxetine (CYMBALTA) DR capsule 30 mg  30 mg Oral Daily Ernie Avena, MD   30 mg at 02/10/22 4239   hydrOXYzine (ATARAX) tablet 25 mg  25 mg Oral TID PRN Glyn Ade, MD   25 mg at 02/10/22 5320   QUEtiapine (SEROQUEL) tablet 100 mg  100 mg Oral QHS Ernie Avena, MD   100 mg at 02/09/22 2156   Current Outpatient Medications  Medication Sig Dispense Refill   Calcium Carbonate Antacid (ALKA-SELTZER ANTACID PO) Take 2 packets by mouth as needed (heartburn).     clonazePAM (KLONOPIN) 1 MG tablet Take 1 mg by mouth in the morning, at noon, and at bedtime.     DULoxetine (CYMBALTA) 30 MG capsule Take 30 mg by mouth daily.     QUEtiapine (SEROQUEL) 100 MG tablet Take 1 tablet (100 mg total) by mouth at bedtime. 30 tablet 0   EPINEPHrine 0.3 mg/0.3 mL IJ SOAJ injection Inject 0.3 mg into the  muscle as needed for anaphylaxis. (Patient not taking: Reported on 11/28/2021) 1 each 0   gabapentin (NEURONTIN) 300 MG capsule Take 1 capsule (300 mg total) by mouth 3 (three) times daily. (Patient not taking: Reported on 01/02/2022) 90 capsule 0   lisinopril-hydrochlorothiazide (ZESTORETIC) 10-12.5 MG tablet Take 1 tablet by mouth daily. (Patient not taking: Reported on 01/02/2022)     naltrexone (DEPADE) 50 MG tablet Take 50 mg by mouth daily. (Patient not taking: Reported on 01/02/2022)     omeprazole (PRILOSEC) 20 MG capsule Take 1 capsule (20 mg total) by mouth daily. (Patient not  taking: Reported on 11/28/2021) 30 capsule 0   sucralfate (CARAFATE) 1 g tablet Take 1 tablet (1 g total) by mouth 4 (four) times daily -  with meals and at bedtime. (Patient not taking: Reported on 11/28/2021) 60 tablet 0    Lab Results:  Results for orders placed or performed during the hospital encounter of 02/09/22 (from the past 48 hour(s))  CBG monitoring, ED     Status: None   Collection Time: 02/09/22  9:22 AM  Result Value Ref Range   Glucose-Capillary 94 70 - 99 mg/dL    Comment: Glucose reference range applies only to samples taken after fasting for at least 8 hours.  Urine rapid drug screen (hosp performed)     Status: Abnormal   Collection Time: 02/09/22  9:24 AM  Result Value Ref Range   Opiates NONE DETECTED NONE DETECTED   Cocaine NONE DETECTED NONE DETECTED   Benzodiazepines POSITIVE (A) NONE DETECTED   Amphetamines NONE DETECTED NONE DETECTED   Tetrahydrocannabinol POSITIVE (A) NONE DETECTED   Barbiturates NONE DETECTED NONE DETECTED    Comment: (NOTE) DRUG SCREEN FOR MEDICAL PURPOSES ONLY.  IF CONFIRMATION IS NEEDED FOR ANY PURPOSE, NOTIFY LAB WITHIN 5 DAYS.  LOWEST DETECTABLE LIMITS FOR URINE DRUG SCREEN Drug Class                     Cutoff (ng/mL) Amphetamine and metabolites    1000 Barbiturate and metabolites    200 Benzodiazepine                 200 Opiates and metabolites         300 Cocaine and metabolites        300 THC                            50 Performed at [facility], [address], [place], [place] [address]   Urinalysis, Routine w reflex microscopic Anterior Nasal Swab     Status: None   Collection Time: 02/09/22  9:24 AM  Result Value Ref Range   Color, Urine YELLOW YELLOW   APPearance CLEAR CLEAR   Specific Gravity, Urine 1.011 1.005 - 1.030   pH 7.0 5.0 - 8.0   Glucose, UA NEGATIVE NEGATIVE mg/dL   Hgb urine dipstick NEGATIVE NEGATIVE   Bilirubin Urine NEGATIVE NEGATIVE   Ketones, ur NEGATIVE NEGATIVE mg/dL   Protein, ur NEGATIVE NEGATIVE mg/dL   Nitrite NEGATIVE NEGATIVE   Leukocytes,Ua NEGATIVE NEGATIVE    Comment: Performed at [facility], [address] [place], [place] [address]  Comprehensive metabolic panel     Status: None   Collection Time: 02/09/22  9:46 AM  Result Value Ref Range   Sodium 141 135 - 145 mmol/L   Potassium 4.7 3.5 - 5.1 mmol/L   Chloride 111 98 - 111 mmol/L   CO2 25 22 - 32 mmol/L   Glucose, Bld 85 70 - 99 mg/dL    Comment: Glucose reference range applies only to samples taken after fasting for at least 8 hours.   BUN 10 8 - 23 mg/dL   Creatinine, Ser 3.53 0.61 - 1.24 mg/dL   Calcium 9.0 8.9 - 29.9 mg/dL   Total Protein 6.6 6.5 - 8.1 g/dL   Albumin 3.8 3.5 - 5.0 g/dL   AST 18 15 - 41 U/L   ALT 17 0 - 44 U/L   Alkaline Phosphatase 80 38 -  126 U/L   Total Bilirubin 0.4 0.3 - 1.2 mg/dL   GFR, Estimated >60 >60 mL/min    Comment: (NOTE) Calculated using the CKD-EPI Creatinine Equation (2021)    Anion gap 5 5 - 15    Comment: Performed at [facility], [place] [address] [place], [place] [id number]  Salicylate level     Status: Abnormal   Collection Time: 02/09/22  9:46 AM  Result Value Ref Range   Salicylate Lvl Q000111Q (L) 7.0 - 30.0 mg/dL    Comment: Performed at [facility], [place] [address], [place], [place] [address]   Acetaminophen level     Status: Abnormal   Collection Time: 02/09/22  9:46 AM  Result Value Ref Range   Acetaminophen (Tylenol), Serum <10 (L) 10 - 30 ug/mL    Comment: (NOTE) Therapeutic concentrations vary significantly. A range of 10-30 ug/mL  may be an effective concentration for many patients. However, some  are best treated at concentrations outside of this range. Acetaminophen concentrations >150 ug/mL at 4 hours after ingestion  and >50 ug/mL at 12 hours after ingestion are often associated with  toxic reactions.  Performed at [facility], [place] [address] [place], [place] [address]   Ethanol     Status: None   Collection Time: 02/09/22  9:46 AM  Result Value Ref Range   Alcohol, Ethyl (B) <10 <10 mg/dL    Comment: (NOTE) Lowest detectable limit for serum alcohol is 10 mg/dL.  For medical purposes only. Performed at [facility], [place] [address], [place], [place] [address]   CBC with Diff     Status: Abnormal   Collection Time: 02/09/22  9:46 AM  Result Value Ref Range   WBC 7.5 4.0 - 10.5 K/uL   RBC 4.46 4.22 - 5.81 MIL/uL   Hemoglobin 14.9 13.0 - 17.0 g/dL   HCT 43.4 39.0 - 52.0 %   MCV 97.3 80.0 - 100.0 fL   MCH 33.4 26.0 - 34.0 pg   MCHC 34.3 30.0 - 36.0 g/dL   RDW 12.4 11.5 - 15.5 %   Platelets 185 150 - 400 K/uL   nRBC 0.0 0.0 - 0.2 %   Neutrophils Relative % 56 %   Neutro Abs 4.3 1.7 - 7.7 K/uL   Lymphocytes Relative 26 %   Lymphs Abs 1.9 0.7 - 4.0 K/uL   Monocytes Relative 12 %   Monocytes Absolute 0.9 0.1 - 1.0 K/uL   Eosinophils Relative 3 %   Eosinophils Absolute 0.3 0.0 - 0.5 K/uL   Basophils Relative 1 %   Basophils Absolute 0.1 0.0 - 0.1 K/uL   Immature Granulocytes 2 %   Abs Immature Granulocytes 0.13 (H) 0.00 - 0.07 K/uL    Comment: Performed at [facility], [place] [address] [place], [place] [address]  Resp Panel by RT-PCR (Flu A&B, Covid) Anterior Nasal Swab     Status: None    Collection Time: 02/09/22  9:55 AM   Specimen: Anterior Nasal Swab  Result Value Ref Range   SARS Coronavirus 2 by RT PCR NEGATIVE NEGATIVE    Comment: (NOTE) SARS-CoV-2 target nucleic acids are NOT DETECTED.  The SARS-CoV-2 RNA is generally detectable in upper respiratory specimens during the acute phase of infection. The lowest concentration of SARS-CoV-2 viral copies this assay can detect is 138 copies/mL. A negative result does not preclude SARS-Cov-2 infection and should not be used as the sole basis for treatment or other  patient management decisions. A negative result may occur with  improper specimen collection/handling, submission of specimen other than nasopharyngeal swab, presence of viral mutation(s) within the areas targeted by this assay, and inadequate number of viral copies(<138 copies/mL). A negative result must be combined with clinical observations, patient history, and epidemiological information. The expected result is Negative.  Fact Sheet for Patients:  EntrepreneurPulse.com.au  Fact Sheet for Healthcare Providers:  IncredibleEmployment.be  This test is no t yet approved or cleared by the [place] FDA and  has been authorized for detection and/or diagnosis of SARS-CoV-2 by FDA under an Emergency Use Authorization (EUA). This EUA will remain  in effect (meaning this test can be used) for the duration of the COVID-19 declaration under Section 564(b)(1) of the Act, 21 U.S.C.section 360bbb-3(b)(1), unless the authorization is terminated  or revoked sooner.       Influenza A by PCR NEGATIVE NEGATIVE   Influenza B by PCR NEGATIVE NEGATIVE    Comment: (NOTE) The Xpert Xpress SARS-CoV-2/FLU/RSV plus assay is intended as an aid in the diagnosis of influenza from Nasopharyngeal swab specimens and should not be used as a sole basis for treatment. Nasal washings and aspirates are unacceptable for Xpert Xpress  SARS-CoV-2/FLU/RSV testing.  Fact Sheet for Patients: EntrepreneurPulse.com.au  Fact Sheet for Healthcare Providers: IncredibleEmployment.be  This test is not yet approved or cleared by the [place] FDA and has been authorized for detection and/or diagnosis of SARS-CoV-2 by FDA under an Emergency Use Authorization (EUA). This EUA will remain in effect (meaning this test can be used) for the duration of the COVID-19 declaration under Section 564(b)(1) of the Act, 21 U.S.C. section 360bbb-3(b)(1), unless the authorization is terminated or revoked.  Performed at [facility], [place] [address] [place], [place] [address]     Blood Alcohol level:  Lab Results  Component Value Date   ETH <10 02/09/2022   ETH 240 (H) 01/02/2022    Physical Findings:  CIWA:  N/A  COWS:   N/A   Psychiatric Specialty Exam:  Presentation  General Appearance:  Appropriate for Environment; Disheveled  Eye Contact: Fair  Speech: Clear and Coherent  Speech Volume: Normal  Handedness: Ambidextrous   Mood and Affect  Mood: Depressed; Hopeless  Affect: Appropriate   Thought Process  Thought Processes: Coherent  Descriptions of Associations:Intact  Orientation:Full (Time, Place and Person)  Thought Content:WDL  History of Schizophrenia/Schizoaffective disorder:No  Duration of Psychotic Symptoms:No data recorded Hallucinations:Hallucinations: None  Ideas of Reference:None  Suicidal Thoughts:Suicidal Thoughts: Yes, Passive SI Active Intent and/or Plan: -- (patient says if we discharge him , he will find a way to harm self) SI Passive Intent and/or Plan: With Means to Carry Out  Homicidal Thoughts:Homicidal Thoughts: No   Sensorium  Memory: Immediate Fair  Judgment: Fair  Insight: Fair   Community education officer  Concentration: Fair  Attention Span: Good  Recall: Good  Fund of  Knowledge: Good  Language: Good   Psychomotor Activity  Psychomotor Activity: Psychomotor Activity: Normal   Assets  Assets: Communication Skills; Desire for Improvement   Sleep  Sleep: Sleep: Poor Number of Hours of Sleep: 3    Physical Exam: Physical Exam Cardiovascular:     Rate and Rhythm: Normal rate.  Pulmonary:     Effort: Pulmonary effort is normal.     Breath sounds: Normal breath sounds.  Musculoskeletal:     Cervical back: Normal range of motion.  Skin:    General: Skin is warm.  Neurological:  General: No focal deficit present.     Mental Status: He is alert.  Psychiatric:        Mood and Affect: Mood normal.        Behavior: Behavior normal. Behavior is cooperative.        Cognition and Memory: Memory normal.    Review of Systems  Constitutional: Negative.   Skin: Negative.   Psychiatric/Behavioral:  Positive for depression and suicidal ideas.    Blood pressure 124/75, pulse 62, temperature 98.5 F (36.9 C), temperature source Oral, resp. rate 18, height 5\' 9"  (1.753 m), weight 77.1 kg, SpO2 96 %. Body mass index is 25.1 kg/m.   Medical Decision Making: Patient seen face to face by this provider, chart reviewed and consulted with Dr. Dwyane Dee on 02/10/22. Zachary [name] is a 61 y.o male patient admitted with suicide attempt via overdose on Klonopin and fight with sister and her husband.   Continue home medications. Duloxetine 30 mg for depression Quetiapine 100 mg for anxiety/ sleep   Problem 1: MDD  Disposition: Recommend psychiatric Inpatient admission when medically cleared.  Patient currently under review at [place], [place] 02/10/2022, 10:50 AM

## 2022-02-10 NOTE — Tx Team (Signed)
Initial Treatment Plan 02/10/2022 5:46 PM Zachary [name] NID:[id number]    PATIENT STRESSORS: Health problems   Occupational concerns   Substance abuse     PATIENT STRENGTHS: Ability for insight  Capable of independent living    PATIENT IDENTIFIED PROBLEMS:   Depression    Suicidal Ideation    Alcohol Abuse    Occupational/ Housing Problems       DISCHARGE CRITERIA:  Adequate post-discharge living arrangements Improved stabilization in mood, thinking, and/or behavior Motivation to continue treatment in a less acute level of care Safe-care adequate arrangements made  PRELIMINARY DISCHARGE PLAN: Outpatient therapy Placement in alternative living arrangements  PATIENT/FAMILY INVOLVEMENT: This treatment plan has been presented to and reviewed with the patient, Zachary [name].  The patient has been given the opportunity to ask questions and make suggestions.  Earma Reading Mariangela Heldt, RN 02/10/2022, 5:46 PM

## 2022-02-10 NOTE — Progress Notes (Signed)
[facility] LCSW Progress Note   02/10/2022    1:27 PM  Zachary [name]   [id number]   Type of Contact and Topic:  Psychiatric Bed Placement   Pt accepted to [facility] rm 307-2  Patient meets inpatient criteria per  Alona Bene, PMHNP  The attending provider will be Phineas Inches, MD  Call report to  [phone]    Lum Babe, RN @ [facility] ED notified.     Signed:  Corky Crafts, MSW, LCSWA, LCAS 02/10/2022 1:29 PM

## 2022-02-10 NOTE — Progress Notes (Signed)
D- Patient alert and oriented. Patient just waking up, states that he fell asleep after dinner. Patient appears anxious. Reports having a "something like a nightmare." Patient standing in the medication window visibly shaking and talking fast.   Denies SI, HI, AVH, and pain.  A-  Support and encouragement provided.  Routine safety checks conducted every 15 minutes.  Patient informed to notify staff with problems or concerns. R-  Patient contracts for safety at this time. Patient compliant with medications and treatment plan. Patient receptive, calm, and cooperative. Patient interacts well with others on the unit.  Patient remains safe at this time.

## 2022-02-10 NOTE — ED Notes (Signed)
Pt remains asleep no distressed or disturbed sleep noted.

## 2022-02-11 ENCOUNTER — Encounter (HOSPITAL_COMMUNITY): Payer: Self-pay

## 2022-02-11 LAB — CBC
HCT: 44.2 % (ref 39.0–52.0)
Hemoglobin: 15.6 g/dL (ref 13.0–17.0)
MCH: 33.2 pg (ref 26.0–34.0)
MCHC: 35.3 g/dL (ref 30.0–36.0)
MCV: 94 fL (ref 80.0–100.0)
Platelets: 190 10*3/uL (ref 150–400)
RBC: 4.7 MIL/uL (ref 4.22–5.81)
RDW: 12.2 % (ref 11.5–15.5)
WBC: 6.7 10*3/uL (ref 4.0–10.5)
nRBC: 0 % (ref 0.0–0.2)

## 2022-02-11 LAB — COMPREHENSIVE METABOLIC PANEL
ALT: 20 U/L (ref 0–44)
AST: 19 U/L (ref 15–41)
Albumin: 3.9 g/dL (ref 3.5–5.0)
Alkaline Phosphatase: 79 U/L (ref 38–126)
Anion gap: 5 (ref 5–15)
BUN: 14 mg/dL (ref 8–23)
CO2: 24 mmol/L (ref 22–32)
Calcium: 9.1 mg/dL (ref 8.9–10.3)
Chloride: 110 mmol/L (ref 98–111)
Creatinine, Ser: 0.89 mg/dL (ref 0.61–1.24)
GFR, Estimated: >60 mL/min (ref >60)
Glucose, Bld: 124 mg/dL — ABNORMAL HIGH (ref 70–99)
Potassium: 4.1 mmol/L (ref 3.5–5.1)
Sodium: 139 mmol/L (ref 135–145)
Total Bilirubin: 0.6 mg/dL (ref 0.3–1.2)
Total Protein: 6.6 g/dL (ref 6.5–8.1)

## 2022-02-11 LAB — LIPID PANEL
Cholesterol: 235 mg/dL — ABNORMAL HIGH (ref 0–200)
HDL: 54 mg/dL (ref >40)
LDL Cholesterol: 163 mg/dL — ABNORMAL HIGH (ref 0–99)
Total CHOL/HDL Ratio: 4.4 RATIO
Triglycerides: 89 mg/dL (ref <150)
VLDL: 18 mg/dL (ref 0–40)

## 2022-02-11 LAB — TSH: TSH: 2.775 u[IU]/mL (ref 0.350–4.500)

## 2022-02-11 MED ORDER — HYDROXYZINE HCL 50 MG PO TABS
50.0000 mg | ORAL_TABLET | Freq: Three times a day (TID) | ORAL | Status: DC | PRN
  Administered 2022-02-13 – 2022-02-15 (×3): 50 mg via ORAL
  Filled 2022-02-11 (×3): qty 1

## 2022-02-11 MED ORDER — DULOXETINE HCL 60 MG PO CPEP
60.0000 mg | ORAL_CAPSULE | Freq: Every day | ORAL | Status: DC
  Administered 2022-02-12 – 2022-02-15 (×4): 60 mg via ORAL
  Filled 2022-02-11 (×6): qty 1

## 2022-02-11 MED ORDER — GABAPENTIN 400 MG PO CAPS
400.0000 mg | ORAL_CAPSULE | Freq: Three times a day (TID) | ORAL | Status: DC
  Administered 2022-02-11 – 2022-02-15 (×12): 400 mg via ORAL
  Filled 2022-02-11 (×20): qty 1

## 2022-02-11 MED ORDER — LISINOPRIL 5 MG PO TABS
5.0000 mg | ORAL_TABLET | Freq: Every day | ORAL | Status: DC
  Administered 2022-02-11 – 2022-02-15 (×5): 5 mg via ORAL
  Filled 2022-02-11 (×8): qty 1

## 2022-02-11 MED ORDER — QUETIAPINE FUMARATE 200 MG PO TABS
200.0000 mg | ORAL_TABLET | Freq: Every day | ORAL | Status: DC
  Administered 2022-02-11: 200 mg via ORAL
  Filled 2022-02-11 (×3): qty 1

## 2022-02-11 MED ORDER — GABAPENTIN 300 MG PO CAPS: 300.0000 mg | ORAL_CAPSULE | Freq: Three times a day (TID) | ORAL | Status: DC

## 2022-02-11 MED ORDER — GABAPENTIN 300 MG PO CAPS
300.0000 mg | ORAL_CAPSULE | Freq: Three times a day (TID) | ORAL | Status: DC
  Administered 2022-02-11: 300 mg via ORAL
  Filled 2022-02-11 (×6): qty 1

## 2022-02-11 MED ORDER — QUETIAPINE FUMARATE 100 MG PO TABS
100.0000 mg | ORAL_TABLET | Freq: Every day | ORAL | Status: DC
  Filled 2022-02-11 (×2): qty 1

## 2022-02-11 MED ORDER — DULOXETINE HCL 30 MG PO CPEP
30.0000 mg | ORAL_CAPSULE | Freq: Every day | ORAL | Status: DC
  Administered 2022-02-11: 30 mg via ORAL
  Filled 2022-02-11 (×4): qty 1

## 2022-02-11 NOTE — Progress Notes (Signed)
Chaplain received a consult for prayer for Zachary [name].  Chaplain engaged him in a conversation and he stated that he was actually doing better now.  He briefly shared that he had been struggling with what to do with a relationship that didn't feel healthy, but he has since determined to end that relationship for his own health.  He did not have any other concerns at this time.  Chaplain let him know that they could talk on Monday if anything else comes up.  [address] [facility] Pager, [phone]

## 2022-02-11 NOTE — Progress Notes (Signed)
Adult Psychoeducational Group Note  Date:  02/11/2022 Time:  11:27 AM  Group Topic/Focus:  Orientation:   The focus of this group is to educate the patient on the purpose and policies of crisis stabilization and provide a format to answer questions about their admission.  The group details unit policies and expectations of patients while admitted.  Participation Level:  Active  Participation Quality:  Appropriate  Affect:  Appropriate  Cognitive:  Appropriate  Insight: Appropriate  Engagement in Group:  Engaged  Modes of Intervention:  Discussion  Additional Comments:  Patient attended morning orientation/goals group and participated.   Daemyn Gariepy W Sharia Averitt 02/11/2022, 11:27 AM

## 2022-02-11 NOTE — Progress Notes (Signed)
Patient did not attend afternoon [facility] therapeutic group.

## 2022-02-11 NOTE — H&P (Signed)
Psychiatric Admission Assessment Adult  Patient Identification: Zachary [name]  MRN:  [id number]  Date of Evaluation:  02/11/2022  Chief Complaint: Suicide attempt by overdose on 12-14 tablets of Clonazepam.  Principal Diagnosis: MDD (major depressive disorder), recurrent severe, without psychosis (HCC)  Diagnosis:  Principal Problem:   MDD (major depressive disorder), recurrent severe, without psychosis (HCC)  History of Present Illness: This is one of several psychiatric admissions in this [facility] for this 61 year old Caucasian male with hx of alcoholism, major depressive disorder, generalized anxiety disorder & substance abuse issues. He is know in this [facility] from his previous admissions for alcohol intoxications, suicidal ideations/attempt & worsening symptoms of depression/anxiety. Patient was last admitted/treated in the [facility] in May of 2023 & was apparently discharged from the old [facility] hospital two weeks ago after a week & half hospitalization. Patient has also been to other psychiatric hospitals with the surrounding area with similar complaints. He is currently being admitted to the [facility] with complaint of suicide attempt by overdose on clonazepam. His UDS result was positive for benzodiazepine & THC. He is in need of mood stabilization treatments.  During this admission evaluation, Zachary [name] reports, "I was taken to the [facility] on Wednesday (two days ago) by the EMS. I called 911 while at my sister's house where I currently live. I called 911 because I could not live at my sister's house any longer. It is not conducive for me. I'm living in their attic without any heat. I can no longer work to make money to support myself because I have bad arthritis to my hands & joints. I was also in a relationship with a lady that did not go well because she was looking for perfection & I ain't perfect. She bad mouths me & called me names such as stinker. I was on medications for my mental health  issues (Cymbalta, gabapentin, clonazepam, Naltrexone & Seroquel) Seroquel was started while I was at the [facility] for suicidal thoughts because of my living situation. I was discharged from there two weeks ago after a week & half hospitalization. The only medicines that worked for me were the clonazepam & Seroquel. The other crappy medicines did not help. On the Wednesday that I was taken to the [facility], I was having an awful day. My truck broke down. It over-heated, I did not sleep a wink the night prior because my sister & her husband stayed up watching tv till 2:00 am. I can no longer take it or handle my situation any more. It is not helping my mental health. I feel messed up in my head. I will need a long term treatment to fix my mental health. I suffer from bad depression/anxiety & always having negative thoughts. While I was not feeling like myself on that Wednesday coupled with poor sleep the night prior because I did not have my Seroquel, I took 12 - 14 tablets of clonazepam to calm myself. That much clonazepam did not make me sleepy, drowsy or confused. It did not alter my memory or my judgement at all. I'm not having any clonazepam withdrawal withdrawal symptoms. I need to get back on it".  Objective: Zachary [name] presents during this evaluation, alert, oriented x 3. He is aware of situation. He is making a good eye contact, verbally responsive & able to make needs known. He presents disheveled, fixated on getting back on Clonazepam & Seroquel. He believes that those are the two medications that woke for  him. His other home medications include Cymbalta, gabapentin & Naltrexone. He says he does not have any alcohol use disorder. Says he only will drink if he was feeling very anxious & does not have his klonopin. Does not want to get back on Naltrexone. Instructed & encouraged Zachary [name] to agree to resume gabapentin & Cymbalta. Explained to him that as much depressed as he is reporting  symptoms of worsening depression/anxiety coupled with his ongoing personal stressors, he still will need the Cymbalta & gabapentin. He is in agreement. Patient is explained that the Klonopin will not be restarted due to his tendency to misuse it. He currently denies any SIHI, AVH, delusional thoughts or paranoia. He does not appear to be responding to any internal stimuli. There are no signs of substance withdrawal symptoms. He denies any alcohol or benzodiazepine related withdrawal seizures. Patient does have hx of suicidal ideations & attempts.  Associated Signs/Symptoms:  Depression Symptoms:  anhedonia, insomnia, feelings of worthlessness/guilt, hopelessness, anxiety,  (Hypo) Manic Symptoms:  Impulsivity,  Anxiety Symptoms:  Excessive Worry,  Psychotic Symptoms:   Patient denies any hallucinations, delusions or paranoia.  PTSD Symptoms: Patient denies any PTSD symptoms or events. NA  Total Time spent with patient: 1 hour  Past Psychiatric History: Patient has had multiple psychiatric hospitalizations here at [facility] in the past.  Reviewed of the electronic medical record revealed his last hospitalization at our facility was on in May of 2023. Has been a patient at the [facility], [facility], [facility], [facility].  He has been treated with multiple medications in the past, but was never compliant. Prior to this hospitalization, he was receiving mental health care/medication management at the [facility] clinic in [place], [place].  Is the patient at risk to self? No.  Has the patient been a risk to self in the past 6 months? Yes.    Has the patient been a risk to self within the distant past? Yes.    Is the patient a risk to others? No.  Has the patient been a risk to others in the past 6 months? No.  Has the patient been a risk to others within the distant past? No.   Prior Inpatient Therapy: Yes, [facility] x numerous times. Prior  Outpatient Therapy: Yes  Alcohol Screening: 1. How often do you have a drink containing alcohol?: 2 to 4 times a month 2. How many drinks containing alcohol do you have on a typical day when you are drinking?: 3 or 4 3. How often do you have six or more drinks on one occasion?: Less than monthly AUDIT-C Score: 4 4. How often during the last year have you found that you were not able to stop drinking once you had started?: Monthly 5. How often during the last year have you failed to do what was normally expected from you because of drinking?: Monthly 6. How often during the last year have you needed a first drink in the morning to get yourself going after a heavy drinking session?: Less than monthly 7. How often during the last year have you had a feeling of guilt of remorse after drinking?: Monthly 8. How often during the last year have you been unable to remember what happened the night before because you had been drinking?: Monthly 9. Have you or someone else been injured as a result of your drinking?: No 10. Has a relative or friend or a doctor or another health worker been concerned  about your drinking or suggested you cut down?: No Alcohol Use Disorder Identification Test Final Score (AUDIT): 13 Alcohol Brief Interventions/Follow-up: Alcohol education/Brief advice  Substance Abuse History in the last 12 months:  Yes.    Consequences of Substance Abuse: Discussed with patient during this admission evaluation. Medical Consequences:  Liver damage, Possible death by overdose Legal Consequences:  Arrests, jail time, Loss of driving privilege. Family Consequences:  Family discord, divorce and or separation.   Previous Psychotropic Medications: Yes  (Seroquel, Klonopin, Cymbalta, Trazodone).  Psychological Evaluations: No   Past Medical History:  Past Medical History:  Diagnosis Date   Alcohol abuse    Anxiety    COPD (chronic obstructive pulmonary disease) (HCC)    Depression     Hypertension    Suicidal ideation     Past Surgical History:  Procedure Laterality Date   COLONOSCOPY     MOUTH SURGERY     NO PAST SURGERIES     Family History:  Family History  Problem Relation Age of Onset   Heart attack Paternal Grandfather    Stroke Mother    Heart failure Mother    Heart failure Father    Family Psychiatric  History: Alcoholism: Mother, brother & father.  Tobacco Screening: Denies any cigarette smoking.  Social History: Engaged, lives in [place], [place],  has 3 grown children, employed.  Social History   Substance and Sexual Activity  Alcohol Use Yes   Comment: BAC was .23 on admission     Social History   Substance and Sexual Activity  Drug Use Yes   Types: Marijuana   Comment: UDS was negative    Additional Social History:  Allergies:   Allergies  Allergen Reactions   Penicillins Other (See Comments)    Childhood allergy Reaction:  Unknown  Has patient had a PCN reaction causing immediate rash, facial/tongue/throat swelling, SOB or lightheadedness with hypotension: Unknown Has patient had a PCN reaction causing severe rash involving mucus membranes or skin necrosis: Unknown Has patient had a PCN reaction that required hospitalization Unknown Has patient had a PCN reaction occurring within the last 10 years: No If all of the above answers are "NO", then may proceed with Cephalosporin use.   Statins Palpitations   Lab Results:  Results for orders placed or performed during the hospital encounter of 02/10/22 (from the past 48 hour(s))  Urinalysis, Complete w Microscopic Urine, Random     Status: None   Collection Time: 02/10/22  5:49 PM  Result Value Ref Range   Color, Urine YELLOW YELLOW   APPearance CLEAR CLEAR   Specific Gravity, Urine 1.016 1.005 - 1.030   pH 6.0 5.0 - 8.0   Glucose, UA NEGATIVE NEGATIVE mg/dL   Hgb urine dipstick NEGATIVE NEGATIVE   Bilirubin Urine NEGATIVE NEGATIVE   Ketones, ur NEGATIVE NEGATIVE mg/dL    Protein, ur NEGATIVE NEGATIVE mg/dL   Nitrite NEGATIVE NEGATIVE   Leukocytes,Ua NEGATIVE NEGATIVE   WBC, UA 0-5 0 - 5 WBC/hpf   Bacteria, UA NONE SEEN NONE SEEN    Comment: Performed at [facility], [address], [place], [place] [address]  Rapid urine drug screen (hospital performed) not at [facility]     Status: Abnormal   Collection Time: 02/10/22  5:50 PM  Result Value Ref Range   Opiates NONE DETECTED NONE DETECTED   Cocaine NONE DETECTED NONE DETECTED   Benzodiazepines POSITIVE (A) NONE DETECTED   Amphetamines NONE DETECTED NONE DETECTED   Tetrahydrocannabinol POSITIVE (A) NONE  DETECTED   Barbiturates NONE DETECTED NONE DETECTED    Comment: (NOTE) DRUG SCREEN FOR MEDICAL PURPOSES ONLY.  IF CONFIRMATION IS NEEDED FOR ANY PURPOSE, NOTIFY LAB WITHIN 5 DAYS.  LOWEST DETECTABLE LIMITS FOR URINE DRUG SCREEN Drug Class                     Cutoff (ng/mL) Amphetamine and metabolites    1000 Barbiturate and metabolites    200 Benzodiazepine                 200 Opiates and metabolites        300 Cocaine and metabolites        300 THC                            50 Performed at [facility], [address] [place], [place] [address]   CBC     Status: None   Collection Time: 02/11/22  6:38 AM  Result Value Ref Range   WBC 6.7 4.0 - 10.5 K/uL   RBC 4.70 4.22 - 5.81 MIL/uL   Hemoglobin 15.6 13.0 - 17.0 g/dL   HCT 98.1 19.1 - 47.8 %   MCV 94.0 80.0 - 100.0 fL   MCH 33.2 26.0 - 34.0 pg   MCHC 35.3 30.0 - 36.0 g/dL   RDW 29.5 62.1 - 30.8 %   Platelets 190 150 - 400 K/uL   nRBC 0.0 0.0 - 0.2 %    Comment: Performed at [facility], [address], [place], [place] [address]  Comprehensive metabolic panel     Status: Abnormal   Collection Time: 02/11/22  6:38 AM  Result Value Ref Range   Sodium 139 135 - 145 mmol/L   Potassium 4.1 3.5 - 5.1 mmol/L   Chloride 110 98 - 111 mmol/L   CO2 24 22 - 32 mmol/L   Glucose, Bld 124 (H)  70 - 99 mg/dL    Comment: Glucose reference range applies only to samples taken after fasting for at least 8 hours.   BUN 14 8 - 23 mg/dL   Creatinine, Ser 6.96 0.61 - 1.24 mg/dL   Calcium 9.1 8.9 - 29.5 mg/dL   Total Protein 6.6 6.5 - 8.1 g/dL   Albumin 3.9 3.5 - 5.0 g/dL   AST 19 15 - 41 U/L   ALT 20 0 - 44 U/L   Alkaline Phosphatase 79 38 - 126 U/L   Total Bilirubin 0.6 0.3 - 1.2 mg/dL   GFR, Estimated >28 >41 mL/min    Comment: (NOTE) Calculated using the CKD-EPI Creatinine Equation (2021)    Anion gap 5 5 - 15    Comment: Performed at [facility], [address], [place], [place] [address]  Lipid panel     Status: Abnormal   Collection Time: 02/11/22  6:38 AM  Result Value Ref Range   Cholesterol 235 (H) 0 - 200 mg/dL   Triglycerides 89 <102 mg/dL   HDL 54 >72 mg/dL   Total CHOL/HDL Ratio 4.4 RATIO   VLDL 18 0 - 40 mg/dL   LDL Cholesterol 536 (H) 0 - 99 mg/dL    Comment:        Total Cholesterol/HDL:CHD Risk Coronary Heart Disease Risk Table                     Men   Women  1/2 Average Risk  3.4   3.3  Average Risk       5.0   4.4  2 X Average Risk   9.6   7.1  3 X Average Risk  23.4   11.0        Use the calculated Patient Ratio above and the CHD Risk Table to determine the patient's CHD Risk.        ATP III CLASSIFICATION (LDL):  <100     mg/dL   Optimal  161-096  mg/dL   Near or Above                    Optimal  130-159  mg/dL   Borderline  045-409  mg/dL   High  >811     mg/dL   Very High Performed at [facility], [address] [place], [place] [address]   TSH     Status: None   Collection Time: 02/11/22  6:38 AM  Result Value Ref Range   TSH 2.775 0.350 - 4.500 uIU/mL    Comment: Performed by a 3rd Generation assay with a functional sensitivity of <=0.01 uIU/mL. Performed at [facility], [address], [place], [place] [address]    Blood Alcohol level:  Lab Results  Component Value Date    ETH <10 02/09/2022   ETH 240 (H) 01/02/2022   Metabolic Disorder Labs:  Lab Results  Component Value Date   HGBA1C 4.9 07/22/2021   MPG 93.93 07/22/2021   MPG 105 10/28/2015   No results found for: "PROLACTIN" Lab Results  Component Value Date   CHOL 235 (H) 02/11/2022   TRIG 89 02/11/2022   HDL 54 02/11/2022   CHOLHDL 4.4 02/11/2022   VLDL 18 02/11/2022   LDLCALC 163 (H) 02/11/2022   LDLCALC 94 07/22/2021   Current Medications: Current Facility-Administered Medications  Medication Dose Route Frequency Provider Last Rate Last Admin   acetaminophen (TYLENOL) tablet 650 mg  650 mg Oral Q6H PRN Ntuen, Jesusita Oka, FNP       alum & mag hydroxide-simeth (MAALOX/MYLANTA) 200-200-20 MG/5ML suspension 30 mL  30 mL Oral Q4H PRN Ntuen, Jesusita Oka, FNP       hydrOXYzine (ATARAX) tablet 25 mg  25 mg Oral TID PRN Cecilie Lowers, FNP   25 mg at 02/11/22 0740   influenza vac split quadrivalent PF (FLUARIX) injection 0.5 mL  0.5 mL Intramuscular Tomorrow-1000 Massengill, Nathan, MD       magnesium hydroxide (MILK OF MAGNESIA) suspension 30 mL  30 mL Oral Daily PRN Ntuen, Jesusita Oka, FNP       pneumococcal 20-valent conjugate vaccine (PREVNAR 20) injection 0.5 mL  0.5 mL Intramuscular Tomorrow-1000 Massengill, Nathan, MD       traZODone (DESYREL) tablet 50 mg  50 mg Oral QHS PRN Ntuen, Jesusita Oka, FNP   50 mg at 02/11/22 0101   ziprasidone (GEODON) injection 20 mg  20 mg Intramuscular Q12H PRN Ntuen, Jesusita Oka, FNP       PTA Medications: Medications Prior to Admission  Medication Sig Dispense Refill Last Dose   Calcium Carbonate Antacid (ALKA-SELTZER ANTACID PO) Take 2 packets by mouth as needed (heartburn).      clonazePAM (KLONOPIN) 1 MG tablet Take 1 mg by mouth in the morning, at noon, and at bedtime.      DULoxetine (CYMBALTA) 30 MG capsule Take 30 mg by mouth daily.      EPINEPHrine 0.3 mg/0.3 mL IJ SOAJ injection Inject 0.3 mg  into the muscle as needed for anaphylaxis. (Patient not taking: Reported on  11/28/2021) 1 each 0    gabapentin (NEURONTIN) 300 MG capsule Take 1 capsule (300 mg total) by mouth 3 (three) times daily. (Patient not taking: Reported on 01/02/2022) 90 capsule 0    lisinopril-hydrochlorothiazide (ZESTORETIC) 10-12.5 MG tablet Take 1 tablet by mouth daily. (Patient not taking: Reported on 01/02/2022)      naltrexone (DEPADE) 50 MG tablet Take 50 mg by mouth daily. (Patient not taking: Reported on 01/02/2022)      omeprazole (PRILOSEC) 20 MG capsule Take 1 capsule (20 mg total) by mouth daily. (Patient not taking: Reported on 11/28/2021) 30 capsule 0    QUEtiapine (SEROQUEL) 100 MG tablet Take 1 tablet (100 mg total) by mouth at bedtime. 30 tablet 0    sucralfate (CARAFATE) 1 g tablet Take 1 tablet (1 g total) by mouth 4 (four) times daily -  with meals and at bedtime. (Patient not taking: Reported on 11/28/2021) 60 tablet 0    Musculoskeletal: Strength & Muscle Tone: within normal limits Gait & Station: normal Patient leans: N/A  Psychiatric Specialty Exam: Physical Exam Vitals and nursing note reviewed.  Constitutional:      Appearance: He is well-developed.  HENT:     Head: Normocephalic and atraumatic.     Mouth/Throat:     Pharynx: Oropharynx is clear.  Cardiovascular:     Rate and Rhythm: Normal rate.  Pulmonary:     Effort: Pulmonary effort is normal.  Genitourinary:    Comments: Deferred Musculoskeletal:        General: Normal range of motion.     Cervical back: Normal range of motion.  Skin:    General: Skin is warm and dry.  Neurological:     Mental Status: He is alert and oriented to person, place, and time.     Review of Systems  Constitutional:  Negative for chills, diaphoresis and fever.  HENT:  Negative for congestion and sore throat.   Respiratory:  Negative for cough, shortness of breath and wheezing.   Cardiovascular:  Negative for chest pain and palpitations.  Gastrointestinal:  Negative for abdominal pain, constipation, diarrhea,  heartburn, nausea and vomiting.  Musculoskeletal:  Negative for joint pain and myalgias.  Skin:        S/p self inflicted lacerations to inner left wrist. Received ten stitches at the ED. Wound is current dry no drainage, swellings or odor noted. Stitches are intact.  Neurological:  Negative for dizziness, tingling, tremors, sensory change, speech change, focal weakness, seizures, loss of consciousness, weakness and headaches.  Endo/Heme/Allergies:        Allergies: PCN, statins.  Psychiatric/Behavioral:  Positive for depression (Hx of), hallucinations and substance abuse (Hx. alcoholism, BAL 33.). Negative for memory loss and suicidal ideas. The patient is nervous/anxious. The patient does not have insomnia.     Blood pressure (!) 141/82, pulse (!) 59, temperature 98.4 F (36.9 C), temperature source Oral, resp. rate 16, height 5\' 9"  (1.753 m), weight 76.7 kg, SpO2 97 %.Body mass index is 24.96 kg/m.  General Appearance: Disheveled  Eye Contact:  Fair  Speech:  Clear and Coherent and Normal Rate  Volume:  Normal  Mood:  Anxious and Depressed  Affect:  Congruent and Flat  Thought Process:  Coherent and Descriptions of Associations: Intact  Orientation:  Full (Time, Place, and Person)  Thought Content:  Logical  Suicidal Thoughts:   Denies any current thoughts, plans or intent.  Homicidal Thoughts:  Denies any thoughts, plans or intent.  Memory:  Immediate;   Good Recent;   Fair Remote;   Fair  Judgement:  Fair  Insight:  Lacking  Psychomotor Activity:  Normal  Concentration:  Concentration: Fair and Attention Span: Fair  Recall:  Good  Fund of Knowledge:  Fair  Language:  Good  Akathisia:  NA  Handed:  Right  AIMS (if indicated):     Assets:  Desire for Improvement Resilience Social Support  ADL's:  Intact  Cognition:  WNL  Sleep:  Number of Hours: 10   Treatment Plan Summary: Daily contact with patient to assess and evaluate symptoms and progress in treatment and  Medication management.   Continue inpatient hospitalization. Will continue today 02/11/2022 plan as below except where it is noted.   Treatment Plan/Recommendations: 1. Admit for crisis management and stabilization, estimated length of stay 3-5 days.   2. Medication management to reduce current symptoms to base line and improve the patient's overall level of functioning: See [facility] for plan of care.   Plan. - Increased gabapentin from 300 mg to 400 mg po tid for depression. - Increased Hydroxyzine from 25 mg to 50 mg po Q 8 hours prn for anxiety..  - Increased gabapentin from 300 mg to 400 mg po tid for anxiety/agitation..  Other prn medications.  - acetaminophen 650 mg po Q 6 hours prn for pain/fever. - Mylanta 30 ml po Q 6 hours prn for indigestion.  - MOM 15 ml po q daily prn for constipation.   3. Treat health problems as indicated: -  Initiated Lisinopril 5 mg po daily for HTN.   4. Develop treatment plan to decrease risk of relapse upon discharge and the need for readmission.  5. Psycho-social education regarding relapse prevention and self care.  6. Health care follow up as needed for medical problems.  7. Review, reconcile, and reinstate any pertinent home medications for other health issues where appropriate. 8. Call for consults with hospitalist for any additional specialty patient care services as needed.  Observation Level/Precautions:  Detox 15 minute checks Seizure  Laboratory:   Reviewed current lab results.  Psychotherapy: Enrolled in the group sessions.   Medications: See MAR   Consultations: As needed   Discharge Concerns: Safety, mood stability.   Estimated LOS: 3-5 days  Other: Admitted to the 400 hall.   Physician Treatment Plan for Primary Diagnosis: MDD (major depressive disorder), recurrent severe, without psychosis (HCC)  Long Term Goal(s): Improvement in symptoms so as ready for discharge  Short Term Goals: Ability to identify changes in lifestyle to  reduce recurrence of condition will improve, Ability to verbalize feelings will improve, Ability to disclose and discuss suicidal ideas, and Ability to demonstrate self-control will improve  Physician Treatment Plan for Secondary Diagnosis: Principal Problem:   MDD (major depressive disorder), recurrent severe, without psychosis (HCC)  Long Term Goal(s): Improvement in symptoms so as ready for discharge  Short Term Goals: Ability to identify and develop effective coping behaviors will improve, Ability to maintain clinical measurements within normal limits will improve, Compliance with prescribed medications will improve, and Ability to identify triggers associated with substance abuse/mental health issues will improve  I certify that inpatient services furnished can reasonably be expected to improve the patient's condition.    Armandina Stammer, NP, pmhnp, fnp-bc 12/1/20239:02 AM

## 2022-02-11 NOTE — BH IP Treatment Plan (Signed)
Interdisciplinary Treatment and Diagnostic Plan Update  02/11/2022 Time of Session: 931 Zachary [name] MRN: [id number]  Principal Diagnosis: MDD (major depressive disorder), recurrent severe, without psychosis ([place])  Secondary Diagnoses: Principal Problem:   MDD (major depressive disorder), recurrent severe, without psychosis ([place])   Current Medications:  Current Facility-Administered Medications  Medication Dose Route Frequency Provider Last Rate Last Admin   acetaminophen (TYLENOL) tablet 650 mg  650 mg Oral Q6H PRN Ntuen, Kris Hartmann, FNP       alum & mag hydroxide-simeth (MAALOX/MYLANTA) 200-200-20 MG/5ML suspension 30 mL  30 mL Oral Q4H PRN Ntuen, Kris Hartmann, FNP       DULoxetine (CYMBALTA) DR capsule 30 mg  30 mg Oral Daily Nwoko, Agnes I, NP       gabapentin (NEURONTIN) capsule 300 mg  300 mg Oral TID Zachary Spar I, NP       hydrOXYzine (ATARAX) tablet 25 mg  25 mg Oral TID PRN Zachary Bolster, FNP   25 mg at 02/11/22 0740   influenza vac split quadrivalent PF (FLUARIX) injection 0.5 mL  0.5 mL Intramuscular Tomorrow-1000 Massengill, Nathan, MD       magnesium hydroxide (MILK OF MAGNESIA) suspension 30 mL  30 mL Oral Daily PRN Ntuen, Kris Hartmann, FNP       pneumococcal 20-valent conjugate vaccine (PREVNAR 20) injection 0.5 mL  0.5 mL Intramuscular Tomorrow-1000 Massengill, Nathan, MD       QUEtiapine (SEROQUEL) tablet 100 mg  100 mg Oral QHS Nwoko, Agnes I, NP       traZODone (DESYREL) tablet 50 mg  50 mg Oral QHS PRN Ntuen, Kris Hartmann, FNP   50 mg at 02/11/22 0101   ziprasidone (GEODON) injection 20 mg  20 mg Intramuscular Q12H PRN Ntuen, Kris Hartmann, FNP       PTA Medications: Medications Prior to Admission  Medication Sig Dispense Refill Last Dose   Calcium Carbonate Antacid (ALKA-SELTZER ANTACID PO) Take 2 packets by mouth as needed (heartburn).      clonazePAM (KLONOPIN) 1 MG tablet Take 1 mg by mouth in the morning, at noon, and at bedtime.      DULoxetine (CYMBALTA) 30 MG capsule Take 30 mg by  mouth daily.      EPINEPHrine 0.3 mg/0.3 mL IJ SOAJ injection Inject 0.3 mg into the muscle as needed for anaphylaxis. (Patient not taking: Reported on 11/28/2021) 1 each 0    gabapentin (NEURONTIN) 300 MG capsule Take 1 capsule (300 mg total) by mouth 3 (three) times daily. (Patient not taking: Reported on 01/02/2022) 90 capsule 0    lisinopril-hydrochlorothiazide (ZESTORETIC) 10-12.5 MG tablet Take 1 tablet by mouth daily. (Patient not taking: Reported on 01/02/2022)      naltrexone (DEPADE) 50 MG tablet Take 50 mg by mouth daily. (Patient not taking: Reported on 01/02/2022)      omeprazole (PRILOSEC) 20 MG capsule Take 1 capsule (20 mg total) by mouth daily. (Patient not taking: Reported on 11/28/2021) 30 capsule 0    QUEtiapine (SEROQUEL) 100 MG tablet Take 1 tablet (100 mg total) by mouth at bedtime. 30 tablet 0    sucralfate (CARAFATE) 1 g tablet Take 1 tablet (1 g total) by mouth 4 (four) times daily -  with meals and at bedtime. (Patient not taking: Reported on 11/28/2021) 60 tablet 0     Patient Stressors: Health problems   Occupational concerns   Substance abuse    Patient Strengths: Ability for insight  Capable of independent living   Treatment  Modalities: Medication Management, Group therapy, Case management,  1 to 1 session with clinician, Psychoeducation, Recreational therapy.   Physician Treatment Plan for Primary Diagnosis: MDD (major depressive disorder), recurrent severe, without psychosis (HCC) Long Term Goal(s): Improvement in symptoms so as ready for discharge   Short Term Goals: Ability to identify and develop effective coping behaviors will improve Ability to maintain clinical measurements within normal limits will improve Compliance with prescribed medications will improve Ability to identify triggers associated with substance abuse/mental health issues will improve Ability to identify changes in lifestyle to reduce recurrence of condition will improve Ability to  verbalize feelings will improve Ability to disclose and discuss suicidal ideas Ability to demonstrate self-control will improve  Medication Management: Evaluate patient's response, side effects, and tolerance of medication regimen.  Therapeutic Interventions: 1 to 1 sessions, Unit Group sessions and Medication administration.  Evaluation of Outcomes: Progressing  Physician Treatment Plan for Secondary Diagnosis: Principal Problem:   MDD (major depressive disorder), recurrent severe, without psychosis (HCC)  Long Term Goal(s): Improvement in symptoms so as ready for discharge   Short Term Goals: Ability to identify and develop effective coping behaviors will improve Ability to maintain clinical measurements within normal limits will improve Compliance with prescribed medications will improve Ability to identify triggers associated with substance abuse/mental health issues will improve Ability to identify changes in lifestyle to reduce recurrence of condition will improve Ability to verbalize feelings will improve Ability to disclose and discuss suicidal ideas Ability to demonstrate self-control will improve     Medication Management: Evaluate patient's response, side effects, and tolerance of medication regimen.  Therapeutic Interventions: 1 to 1 sessions, Unit Group sessions and Medication administration.  Evaluation of Outcomes: Progressing   RN Treatment Plan for Primary Diagnosis: MDD (major depressive disorder), recurrent severe, without psychosis (HCC) Long Term Goal(s): Knowledge of disease and therapeutic regimen to maintain health will improve  Short Term Goals: Ability to remain free from injury will improve, Ability to verbalize frustration and anger appropriately will improve, Ability to demonstrate self-control, Ability to participate in decision making will improve, Ability to verbalize feelings will improve, Ability to disclose and discuss suicidal ideas, Ability to  identify and develop effective coping behaviors will improve, and Compliance with prescribed medications will improve  Medication Management: RN will administer medications as ordered by provider, will assess and evaluate patient's response and provide education to patient for prescribed medication. RN will report any adverse and/or side effects to prescribing provider.  Therapeutic Interventions: 1 on 1 counseling sessions, Psychoeducation, Medication administration, Evaluate responses to treatment, Monitor vital signs and CBGs as ordered, Perform/monitor CIWA, COWS, AIMS and Fall Risk screenings as ordered, Perform wound care treatments as ordered.  Evaluation of Outcomes: Progressing   LCSW Treatment Plan for Primary Diagnosis: MDD (major depressive disorder), recurrent severe, without psychosis (HCC) Long Term Goal(s): Safe transition to appropriate next level of care at discharge, Engage patient in therapeutic group addressing interpersonal concerns.  Short Term Goals: Engage patient in aftercare planning with referrals and resources, Increase social support, Increase ability to appropriately verbalize feelings, Increase emotional regulation, Facilitate acceptance of mental health diagnosis and concerns, Facilitate patient progression through stages of change regarding substance use diagnoses and concerns, Identify triggers associated with mental health/substance abuse issues, and Increase skills for wellness and recovery  Therapeutic Interventions: Assess for all discharge needs, 1 to 1 time with Social worker, Explore available resources and support systems, Assess for adequacy in community support network, Educate family and significant other(s) on  suicide prevention, Complete Psychosocial Assessment, Interpersonal group therapy.  Evaluation of Outcomes: Progressing   Progress in Treatment: Attending groups: Yes. Participating in groups: Yes. Taking medication as prescribed:  Yes. Toleration medication: Yes. Family/Significant other contact made:  Patient understands diagnosis: Yes. Discussing patient identified problems/goals with staff: Yes. Medical problems stabilized or resolved: Yes. Denies suicidal/homicidal ideation: Yes. Issues/concerns per patient self-inventory: Yes. Other:   New problem(s) identified: No, pt denied  New Short Term/Long Term Goal(s): Medication Stabilization  Patient Goals:  Inpatient treatment  Discharge Plan or Barriers:   Reason for Continuation of Hospitalization: Anxiety Depression Medical Issues Medication stabilization Suicidal ideation Withdrawal symptoms  Estimated Length of Stay: 3-7  Last 3 [place] Suicide Severity Risk Score: Flowsheet Row Ad[facility] (Current[facility]09/2022 in [facility] [place] [facility] from 01[facility]02/2022 in [facility] [place] [facility]  C-SSRS RISK CATEGORY No Risk High Risk High Risk       Last PHQ 2/9 Scores:    09/09/2021   11:01 AM 09/06/2021    2:23 PM  Depression screen PHQ 2/9  Decreased Interest 0 3  Down, Depressed, Hopeless 3 3  PHQ - 2 Score 3 6  Altered sleeping 0 3  Tired, decreased energy 1 3  Change in appetite 0 3  Feeling bad or failure about yourself  0 3  Trouble concentrating 0 3  Moving slowly or fidgety/restless 0 3  Suicidal thoughts 0 3  PHQ-9 Score 4 27  Difficult doing work/chores Extremely dIfficult Extremely dIfficult    detox, medication management for mood stabilization; elimination of SI thoughts; development of comprehensive mental wellness/sobriety plan     Scribe for Treatment Team: Ane Payment, LCSW 02/11/2022 9:59 AM

## 2022-02-11 NOTE — [facility] Suicide Risk Assessment (Signed)
Suicide Risk Assessment  Admission Assessment    [facility] Admission Suicide Risk Assessment   Nursing information obtained from:  Patient  Demographic factors:  Male  Current Mental Status: Alert, oriented x 4  Loss Factors:  Loss of significant relationship, Decline in physical health  Historical Factors:  NA  Risk Reduction Factors:  Religious beliefs about death, Living with another person, especially a relative, Employed  Total Time spent with patient: 1 hour  Principal Problem: MDD (major depressive disorder), recurrent severe, without psychosis (HCC)  Diagnosis:  Principal Problem:   MDD (major depressive disorder), recurrent severe, without psychosis (HCC)  Subjective Data: See H&P.  Continued Clinical Symptoms:  Alcohol Use Disorder Identification Test Final Score (AUDIT): 13 The "Alcohol Use Disorders Identification Test", Guidelines for Use in Primary Care, Second Edition.  World Science writer [facility]). Score between 0-7:  no or low risk or alcohol related problems. Score between 8-15:  moderate risk of alcohol related problems. Score between 16-19:  high risk of alcohol related problems. Score 20 or above:  warrants further diagnostic evaluation for alcohol dependence and treatment.  CLINICAL FACTORS:   Severe Anxiety and/or Agitation Depression:   Hopelessness Impulsivity Insomnia Severe Alcohol/Substance Abuse/Dependencies More than one psychiatric diagnosis Previous Psychiatric Diagnoses and Treatments Medical Diagnoses and Treatments/Surgeries   Musculoskeletal: Strength & Muscle Tone: within normal limits Gait & Station: normal Patient leans: N/A  Psychiatric Specialty Exam:  Presentation  General Appearance:  Disheveled  Eye Contact: Good  Speech: Clear and Coherent; Normal Rate  Speech Volume: Normal  Handedness: Right   Mood and Affect  Mood: Anxious; Depressed; Hopeless  Affect: Congruent   Thought Process  Thought  Processes: Coherent  Descriptions of Associations:Intact  Orientation:Full (Time, Place and Person)  Thought Content:Logical; Rumination  History of Schizophrenia/Schizoaffective disorder:No  Duration of Psychotic Symptoms:No data recorded Hallucinations:Hallucinations: None  Ideas of Reference:None  Suicidal Thoughts:Suicidal Thoughts: No SI Active Intent and/or Plan: Without Plan; Without Intent; Without Means to Carry Out; Without Access to Means SI Passive Intent and/or Plan: Without Intent; Without Plan; Without Means to Carry Out; Without Access to Means  Homicidal Thoughts:Homicidal Thoughts: No   Sensorium  Memory: Recent Good; Immediate Good; Remote Good  Judgment: Fair  Insight: Lacking   Executive Functions  Concentration: Fair  Attention Span: Fair  Recall: Good  Fund of Knowledge: Fair  Language: Good   Psychomotor Activity  Psychomotor Activity: Psychomotor Activity: Other (comment) (anxious about getting back on Klonopin.)   Assets  Assets: Communication Skills; Desire for Improvement; Resilience; Social Support   Sleep  Sleep: Sleep: Good Number of Hours of Sleep: 10   Physical Exam: See H&P. Blood pressure (!) 138/91, pulse (!) 59, temperature 98.4 F (36.9 C), temperature source Oral, resp. rate 16, height 5\' 9"  (1.753 m), weight 76.7 kg, SpO2 97 %. Body mass index is 24.96 kg/m.   COGNITIVE FEATURES THAT CONTRIBUTE TO RISK:  Closed-mindedness, Polarized thinking, and Thought constriction (tunnel vision)    SUICIDE RISK:   Moderate:  Frequent suicidal ideation with limited intensity, and duration, some specificity in terms of plans, no associated intent, good self-control, limited dysphoria/symptomatology, some risk factors present, and identifiable protective factors, including available and accessible social support.  PLAN OF CARE: See treatment plan on H&P.  I certify that inpatient services furnished can reasonably  be expected to improve the patient's condition.   , NP, pmhnp, fnp-bc. 02/11/2022, 1:36 PM

## 2022-02-11 NOTE — [facility] Suicide Risk Assessment (Signed)
[facility] INPATIENT:  Family/Significant Other Suicide Prevention Education  Suicide Prevention Education:  Education Completed; 02-11-2022 Frazier Richards [phone] (sister)has been identified by the patient as the family member/significant other with whom the patient will be residing, and identified as the person(s) who will aid the patient in the event of a mental health crisis (suicidal ideations/suicide attempt).  With written consent from the patient, the family member/significant other has been provided the following suicide prevention education, prior to the and/or following the discharge of the patient.  The suicide prevention education provided includes the following: Suicide risk factors Suicide prevention and interventions National Suicide Hotline telephone number [facility] assessment telephone number [facility] Emergency Assistance 911 [facility] and/or Residential Mobile Crisis Unit telephone number  Request made of family/significant other to: Remove weapons (e.g., guns, rifles, knives), all items previously/currently identified as safety concern.   Remove drugs/medications (over-the-counter, prescriptions, illicit drugs), all items previously/currently identified as a safety concern.  [place]pherd [phone] (sister) verbalizes understanding of the suicide prevention education information provided.  The family member/significant other agrees to remove the items of safety concern listed above.  Hilmer Aliberti S Gustavus Haskin 02/11/2022, 3:11 PM

## 2022-02-11 NOTE — Progress Notes (Signed)
D: Pt denied SI/HI/AVH this morning. When asked about SI, pt states " I don't have any thoughts of hurting myself right now but the feelings of hurting myself are back and forth, sometimes I feel like I don't want to be here anymore". Pt verbally contracted for safety. Pt rated his depression a 8/10, anxiety a 8/10, and feelings of hopelessness a 8/10. Pt reports that his goal for today is "I want to get happy to be alive". Pt has been pleasant, calm, and cooperative throughout the shift.   A: RN provided support and encouragement to patient. Pt given scheduled medications as prescribed. PRN Hydroxyzine given for anxiety.  Q15 min checks verified for safety.    R: Patient verbally contracts for safety. Patient compliant with medications and treatment plan. Patient is interacting well on the unit. Pt is safe on the unit.   02/11/22 1051  Psych Admission Type (Psych Patients Only)  Admission Status Voluntary  Psychosocial Assessment  Patient Complaints Anxiety;Depression  Eye Contact Fair  Facial Expression Flat;Sad  Affect Depressed  Speech Logical/coherent  Interaction Assertive  Motor Activity Slow  Appearance/Hygiene Unremarkable  Behavior Characteristics Appropriate to situation;Cooperative  Mood Depressed;Anxious  Thought Process  Coherency WDL  Content WDL  Delusions None reported or observed  Perception WDL  Hallucination None reported or observed  Judgment Limited  Confusion None  Danger to Self  Current suicidal ideation? Denies  Agreement Not to Harm Self Yes  Description of Agreement Verbal  Danger to Others  Danger to Others None reported or observed
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
## 2022-02-11 NOTE — Progress Notes (Signed)
   02/11/22 2003  Psych Admission Type (Psych Patients Only)  Admission Status Voluntary  Psychosocial Assessment  Patient Complaints None  Eye Contact Fair  Facial Expression Flat  Affect Appropriate to circumstance  Speech Logical/coherent  Interaction Assertive  Motor Activity Slow  Appearance/Hygiene Unremarkable  Behavior Characteristics Appropriate to situation;Cooperative  Mood Pleasant  Thought Process  Coherency WDL  Content WDL  Delusions None reported or observed  Perception WDL  Hallucination None reported or observed  Judgment Limited  Confusion None  Danger to Self  Current suicidal ideation? Denies  Agreement Not to Harm Self Yes  Description of Agreement Verbally contracts for safety.  Danger to Others  Danger to Others None reported or observed   Patient alert and oriented. Presenting appropriate to circumstance and pleasant .Patient denies SI, HI, AVH, and pain. Patient denies anxiety and depression at this time. Scheduled medications administered to patient, per provider orders. Support and encouragement provided. Routine safety checks conducted every 15 minutes. Patient verbally contracts for safety. Patient receptive, calm, and cooperative and remains safe on the unit.

## 2022-02-11 NOTE — Plan of Care (Signed)
  Problem: Education: Goal: Emotional status will improve Outcome: Progressing Goal: Mental status will improve Outcome: Progressing Goal: Verbalization of understanding the information provided will improve Outcome: Progressing   Problem: Activity: Goal: Interest or engagement in activities will improve Outcome: Progressing   

## 2022-02-12 LAB — HEMOGLOBIN A1C
Hgb A1c MFr Bld: 5.1 % (ref 4.8–5.6)
Mean Plasma Glucose: 100 mg/dL

## 2022-02-12 MED ORDER — QUETIAPINE FUMARATE 300 MG PO TABS
300.0000 mg | ORAL_TABLET | Freq: Every day | ORAL | Status: DC
  Administered 2022-02-12 – 2022-02-14 (×3): 300 mg via ORAL
  Filled 2022-02-12 (×6): qty 1

## 2022-02-12 NOTE — Progress Notes (Signed)
   02/12/22 0700  Sleep  Number of Hours 10

## 2022-02-12 NOTE — Progress Notes (Signed)
Adult Psychoeducational Group Note  Date:  02/12/2022 Time:  8:57 PM  Group Topic/Focus:  Wrap-Up Group:   The focus of this group is to help patients review their daily goal of treatment and discuss progress on daily workbooks.  Participation Level:  Active  Participation Quality:  Appropriate  Affect:  Appropriate  Cognitive:  Appropriate  Insight: Appropriate  Engagement in Group:  Engaged  Modes of Intervention:  Education and Exploration  Additional Comments:  Patient attended and participated in group tonight. He reports that today his day was a 10 because he did not struggle with thoughts like he has been doing before. Groups were excellent. He likes that he is very giving.  Lita Mains [facility] 02/12/2022, 8:57 PM

## 2022-02-12 NOTE — Group Note (Deleted)
LCSW Group Therapy Note  Group Date: 02/12/2022 Start Time: 1000 End Time: 1100   Type of Therapy and Topic:  Group Therapy: Using "I" Statements  Participation Level:  {[facility] PARTICIPATION LEVEL:22264}  Description of Group:  Patients were asked to provide details of some interpersonal conflicts they have experienced. Patients were then educated about "I" statements, communication which focuses on feelings or views of the speaker rather than what the other person is doing. T group members were asked to reflect on past conflicts and to provide specific examples for utilizing "I" statements.  Therapeutic Goals:  1. Patients will verbalize understanding of ineffective communication and effective communication. 2. Patients will be able to empathize with whom they are having conflict. 3. Patients will practice effective communication in the form of "I" statements.    Summary of Patient Progress:  *** shared ***. The patient was ***present/active throughout the session and proved open to feedback from CSW and peers. Patient demonstrated *** insight into the subject matter, was respectful of peers, and was present throughout the entire session.  Therapeutic Modalities:   Cognitive Behavioral Therapy Solution-Focused Therapy    Kaleab Frasier J Grossman-Orr, LCSW 02/12/2022  9:42 AM    

## 2022-02-12 NOTE — Group Note (Signed)
LCSW Group Therapy Note   No social work group was held; the following was provided to each patient  in lieu of in-person group:  Healthy vs. Unhealthy  Coping Skills and Supports   Unhealthy Qualities                                             Healthy Qualities Works (at first) Works   Stops working or starts hurting Continues working  Fast Usually takes time to develop  Easy Often difficult to learn  Usually a habit Usually unknown, has to become a habit  Can do alone Often need to reach out for help   Leads to loss Leads to gain         My Unhealthy Coping Skills                                    My Healthy Coping Skills                       My Unhealthy Supports                                           My Healthy Supports                         Brondon Wann Grossman-Orr, LCSW 02/12/2022  5:46 PM      

## 2022-02-12 NOTE — Progress Notes (Signed)
   02/12/22 2200  Psych Admission Type (Psych Patients Only)  Admission Status Voluntary  Psychosocial Assessment  Patient Complaints None  Eye Contact Fair  Facial Expression Flat  Affect Appropriate to circumstance  Speech Logical/coherent  Interaction Assertive  Motor Activity Slow  Appearance/Hygiene Unremarkable  Behavior Characteristics Appropriate to situation;Cooperative  Mood Pleasant  Thought Process  Coherency WDL  Content WDL  Delusions None reported or observed  Perception WDL  Hallucination None reported or observed  Judgment Limited  Confusion None  Danger to Self  Current suicidal ideation? Denies  Agreement Not to Harm Self Yes  Description of Agreement vrbally contracts for safety  Danger to Others  Danger to Others None reported or observed

## 2022-02-12 NOTE — [facility] Group Notes (Signed)
Psychoeducational Group Note  Date: 02/12/22 Time: 0900-1000    Goal Setting   Purpose of Group: This group helps to provide patients with the steps of setting a goal that is specific, measurable, attainable, realistic and time specific. A discussion on how we keep ourselves stuck with negative self talk. Homework given for Patients to write 30 positive attributes about themselves.    Participation Level:  Active  Participation Quality:  Appropriate  Affect:  Appropriate  Cognitive:  Appropriate  Insight:  Improving  Engagement in Group:  Engaged  Additional Comments:  Rates energy at a 5/10. Participated fully in the group.  Dione Housekeeper

## 2022-02-12 NOTE — Progress Notes (Signed)
Adult Psychoeducational Group Note  Date:  02/12/2022 Time:  9:34 AM  Group Topic/Focus:  Appropriate  Participation Level:  Active  Participation Quality:  Appropriate  Affect:  Appropriate  Cognitive:  Appropriate  Insight: Appropriate  Engagement in Group:  Engaged  Modes of Intervention:  Discussion  Additional Comments:  Patient attended morning orientation/goals group and participated.  Tommie Dejoseph W Emersyn Wyss 02/12/2022, 9:34 AM

## 2022-02-12 NOTE — Progress Notes (Signed)
[facility] MD Progress Note  02/12/2022 9:54 AM Zachary [name]  MRN:  [id number]  Reason for admission:  This is one of several psychiatric admissions in this [facility] for this 61 year old Caucasian male with hx of alcoholism, major depressive disorder, generalized anxiety disorder & substance abuse issues. He is know in this [facility] from his previous admissions for alcohol intoxications, suicidal ideations/attempt & worsening symptoms of depression/anxiety. Patient was last admitted/treated in the [facility] in May of 2023 & was apparently discharged from the old [facility] hospital two weeks ago after a week & half hospitalization. Patient has also been to other psychiatric hospitals with the surrounding area with similar complaints. He is currently being admitted to the [facility] with complaint of suicide attempt by overdose on clonazepam. His UDS result was positive for benzodiazepine & THC. He is in need of mood stabilization treatments.   Daily notes:  Kristine is seen in his room. Chart reviewed. The chart findings discussed with the treatment team. He presents alert, oriented & aware of situation. He is visible on the unit, attending group sessions. He is fairly groomed. He reports today that he is starting to feel better. He says the reason for his feeling of hopelessness is because of his living situation & running out of his Seroquel. He says now that his sister is showing sign that she is being understanding about his situation is giving him the hope of wanting to try. He says he is happy that he has re-started his medications & feels the dose adjustments to his medications will do him a lot of good. He says he slept well last night. He says he is taking & tolerating his treatment regimen, denies any side effects. We adjusted the dose of his Seroquel by increasing it from 200 mg to 300 mg Q hs for mood stabilization. He says his working with his sister to get his living arrangement more conducive. He currently denies any SIHI,  AVH, delusional thoughts or paranoia. He does not appear to be responding to any internal stimuli. We will continue current plan of care as already in progress below.  Principal Problem: MDD (major depressive disorder), recurrent severe, without psychosis ([place])   Diagnosis: Principal Problem:   MDD (major depressive disorder), recurrent severe, without psychosis ([place])  Total Time spent with patient:  35 minutes  Past Psychiatric History: See H&P.  Past Medical History:  Past Medical History:  Diagnosis Date   Alcohol abuse    Anxiety    COPD (chronic obstructive pulmonary disease) ([place])    Depression    Hypertension    Suicidal ideation     Past Surgical History:  Procedure Laterality Date   COLONOSCOPY     MOUTH SURGERY     NO PAST SURGERIES     Family History:  Family History  Problem Relation Age of Onset   Heart attack Paternal Grandfather    Stroke Mother    Heart failure Mother    Heart failure Father    Family Psychiatric  History: See H&P.  Social History:  Social History   Substance and Sexual Activity  Alcohol Use Yes   Comment: BAC was .23 on admission     Social History   Substance and Sexual Activity  Drug Use Yes   Types: Marijuana   Comment: UDS was negative    Social History   Socioeconomic History   Marital status: Divorced    Spouse name: Not on file   Number of children: 2  Years of education: Not on file   Highest education level: Not on file  Occupational History   Occupation: Clinical biochemist  Tobacco Use   Smoking status: Former    Packs/day: 2.00    Types: Cigarettes   Smokeless tobacco: Never  Vaping Use   Vaping Use: Not on file  Substance and Sexual Activity   Alcohol use: Yes    Comment: BAC was .23 on admission   Drug use: Yes    Types: Marijuana    Comment: UDS was negative   Sexual activity: Not Currently  Other Topics Concern   Not on file  Social History Narrative   Pt stated that he lives alone; works  as a Clinical biochemist   Social Determinants of Radio broadcast assistant Strain: Not on file  Food Insecurity: No [place] (02/10/2022)   Hunger Vital Sign    Worried About Running Out of Food in the Last Year: Never true    Ran Out of Food in the Last Year: Never true  Transportation Needs: No Transportation Needs (02/10/2022)   PRAPARE - Hydrologist (Medical): No    Lack of Transportation (Non-Medical): No  Physical Activity: Not on file  Stress: Not on file  Social Connections: Not on file   Additional Social History:   Sleep: Good  Appetite:  Good  Current Medications: Current Facility-Administered Medications  Medication Dose Route Frequency Provider Last Rate Last Admin   acetaminophen (TYLENOL) tablet 650 mg  650 mg Oral Q6H PRN Ntuen, Kris Hartmann, FNP       alum & mag hydroxide-simeth (MAALOX/MYLANTA) 200-200-20 MG/5ML suspension 30 mL  30 mL Oral Q4H PRN Ntuen, Kris Hartmann, FNP       DULoxetine (CYMBALTA) DR capsule 60 mg  60 mg Oral Daily Lindell Spar I, NP   60 mg at 02/12/22 0739   gabapentin (NEURONTIN) capsule 400 mg  400 mg Oral TID Lindell Spar I, NP   400 mg at 02/12/22 0739   hydrOXYzine (ATARAX) tablet 50 mg  50 mg Oral TID PRN Lindell Spar I, NP       lisinopril (ZESTRIL) tablet 5 mg  5 mg Oral Daily Lindell Spar I, NP   5 mg at 02/12/22 0739   magnesium hydroxide (MILK OF MAGNESIA) suspension 30 mL  30 mL Oral Daily PRN Ntuen, Kris Hartmann, FNP       QUEtiapine (SEROQUEL) tablet 200 mg  200 mg Oral QHS Shenique Childers I, NP   200 mg at 02/11/22 2147   traZODone (DESYREL) tablet 50 mg  50 mg Oral QHS PRN Ntuen, Kris Hartmann, FNP   50 mg at 02/11/22 0101   ziprasidone (GEODON) injection 20 mg  20 mg Intramuscular Q12H PRN Ntuen, Kris Hartmann, FNP       Lab Results:  Results for orders placed or performed during the hospital encounter of 02/10/22 (from the past 48 hour(s))  Urinalysis, Complete w Microscopic Urine, Random     Status: None    Collection Time: 02/10/22  5:49 PM  Result Value Ref Range   Color, Urine YELLOW YELLOW   APPearance CLEAR CLEAR   Specific Gravity, Urine 1.016 1.005 - 1.030   pH 6.0 5.0 - 8.0   Glucose, UA NEGATIVE NEGATIVE mg/dL   Hgb urine dipstick NEGATIVE NEGATIVE   Bilirubin Urine NEGATIVE NEGATIVE   Ketones, ur NEGATIVE NEGATIVE mg/dL   Protein, ur NEGATIVE NEGATIVE mg/dL   Nitrite NEGATIVE NEGATIVE   Leukocytes,Ua  NEGATIVE NEGATIVE   WBC, UA 0-5 0 - 5 WBC/hpf   Bacteria, UA NONE SEEN NONE SEEN    Comment: Performed at [facility], [place] [address], [place], [place] [address]  Rapid urine drug screen (hospital performed) not at [facility]     Status: Abnormal   Collection Time: 02/10/22  5:50 PM  Result Value Ref Range   Opiates NONE DETECTED NONE DETECTED   Cocaine NONE DETECTED NONE DETECTED   Benzodiazepines POSITIVE (A) NONE DETECTED   Amphetamines NONE DETECTED NONE DETECTED   Tetrahydrocannabinol POSITIVE (A) NONE DETECTED   Barbiturates NONE DETECTED NONE DETECTED    Comment: (NOTE) DRUG SCREEN FOR MEDICAL PURPOSES ONLY.  IF CONFIRMATION IS NEEDED FOR ANY PURPOSE, NOTIFY LAB WITHIN 5 DAYS.  LOWEST DETECTABLE LIMITS FOR URINE DRUG SCREEN Drug Class                     Cutoff (ng/mL) Amphetamine and metabolites    1000 Barbiturate and metabolites    200 Benzodiazepine                 200 Opiates and metabolites        300 Cocaine and metabolites        300 THC                            50 Performed at [facility], [place] [address] [place], [place] [address]   CBC     Status: None   Collection Time: 02/11/22  6:38 AM  Result Value Ref Range   WBC 6.7 4.0 - 10.5 K/uL   RBC 4.70 4.22 - 5.81 MIL/uL   Hemoglobin 15.6 13.0 - 17.0 g/dL   HCT 44.2 39.0 - 52.0 %   MCV 94.0 80.0 - 100.0 fL   MCH 33.2 26.0 - 34.0 pg   MCHC 35.3 30.0 - 36.0 g/dL   RDW 12.2 11.5 - 15.5 %   Platelets 190 150 - 400 K/uL   nRBC 0.0 0.0 - 0.2 %    Comment: Performed  at [facility], [place] [address], [place], [place] [address]  Comprehensive metabolic panel     Status: Abnormal   Collection Time: 02/11/22  6:38 AM  Result Value Ref Range   Sodium 139 135 - 145 mmol/L   Potassium 4.1 3.5 - 5.1 mmol/L   Chloride 110 98 - 111 mmol/L   CO2 24 22 - 32 mmol/L   Glucose, Bld 124 (H) 70 - 99 mg/dL    Comment: Glucose reference range applies only to samples taken after fasting for at least 8 hours.   BUN 14 8 - 23 mg/dL   Creatinine, Ser 0.89 0.61 - 1.24 mg/dL   Calcium 9.1 8.9 - 10.3 mg/dL   Total Protein 6.6 6.5 - 8.1 g/dL   Albumin 3.9 3.5 - 5.0 g/dL   AST 19 15 - 41 U/L   ALT 20 0 - 44 U/L   Alkaline Phosphatase 79 38 - 126 U/L   Total Bilirubin 0.6 0.3 - 1.2 mg/dL   GFR, Estimated >60 >60 mL/min    Comment: (NOTE) Calculated using the CKD-EPI Creatinine Equation (2021)    Anion gap 5 5 - 15    Comment: Performed at [facility], [place] [address], [place], [place] [address]  Hemoglobin A1c     Status: None   Collection Time: 02/11/22  6:38  AM  Result Value Ref Range   Hgb A1c MFr Bld 5.1 4.8 - 5.6 %    Comment: (NOTE)         Prediabetes: 5.7 - 6.4         Diabetes: >6.4         Glycemic control for adults with diabetes: <7.0    Mean Plasma Glucose 100 mg/dL    Comment: (NOTE) Performed At: [facility] [place], [place] [id number] Rush Farmer MD [id number]   Lipid panel     Status: Abnormal   Collection Time: 02/11/22  6:38 AM  Result Value Ref Range   Cholesterol 235 (H) 0 - 200 mg/dL   Triglycerides 89 <150 mg/dL   HDL 54 >40 mg/dL   Total CHOL/HDL Ratio 4.4 RATIO   VLDL 18 0 - 40 mg/dL   LDL Cholesterol 163 (H) 0 - 99 mg/dL    Comment:        Total Cholesterol/HDL:CHD Risk Coronary Heart Disease Risk Table                     Men   Women  1/2 Average Risk   3.4   3.3  Average Risk       5.0   4.4  2 X Average Risk   9.6   7.1  3 X Average Risk  23.4   11.0         Use the calculated Patient Ratio above and the CHD Risk Table to determine the patient's CHD Risk.        ATP III CLASSIFICATION (LDL):  <100     mg/dL   Optimal  100-129  mg/dL   Near or Above                    Optimal  130-159  mg/dL   Borderline  160-189  mg/dL   High  >190     mg/dL   Very High Performed at [facility] [address] [place], [place] [address]   TSH     Status: None   Collection Time: 02/11/22  6:38 AM  Result Value Ref Range   TSH 2.775 0.350 - 4.500 uIU/mL    Comment: Performed by a 3rd Generation assay with a functional sensitivity of <=0.01 uIU/mL. Performed at [facility], [place] [address] [place], [place] [address]    Blood Alcohol level:  Lab Results  Component Value Date   ETH <10 02/09/2022   ETH 240 (H) [id number]   Metabolic Disorder Labs: Lab Results  Component Value Date   HGBA1C 5.1 02/11/2022   MPG 100 02/11/2022   MPG 93.93 07/22/2021   No results found for: "PROLACTIN" Lab Results  Component Value Date   CHOL 235 (H) 02/11/2022   TRIG 89 02/11/2022   HDL 54 02/11/2022   CHOLHDL 4.4 02/11/2022   VLDL 18 02/11/2022   LDLCALC 163 (H) 02/11/2022   LDLCALC 94 07/22/2021   Physical Findings: AIMS:  , ,  ,  ,    CIWA:    COWS:     Musculoskeletal: Strength & Muscle Tone: within normal limits Gait & Station: normal Patient leans: N/A  Psychiatric Specialty Exam:  Presentation  General Appearance:  Disheveled  Eye Contact: Good  Speech: Clear and Coherent; Normal Rate  Speech Volume: Normal  Handedness: Right   Mood and Affect  Mood: Anxious; Depressed; Hopeless  Affect: Congruent  Thought Process  Thought Processes: Coherent  Descriptions of Associations:Intact  Orientation:Full (Time, Place and Person)  Thought Content:Logical; Rumination  History of Schizophrenia/Schizoaffective disorder:No  Duration of Psychotic Symptoms:  NA Hallucinations:Hallucinations: None  Ideas of Reference:None  Suicidal Thoughts:Suicidal Thoughts: No SI Active Intent and/or Plan: Without Plan; Without Intent; Without Means to Carry Out; Without Access to Means SI Passive Intent and/or Plan: Without Intent; Without Plan; Without Means to Carry Out; Without Access to Means  Homicidal Thoughts:Homicidal Thoughts: No  Sensorium  Memory: Recent Good; Immediate Good; Remote Good  Judgment: Fair  Insight: Lacking  Executive Functions  Concentration: Fair  Attention Span: Fair  Recall: Good  Fund of Knowledge: Fair  Language: Good  Psychomotor Activity  Psychomotor Activity: Psychomotor Activity: Other (comment) (anxious about getting back on Klonopin.)  Assets  Assets: Communication Skills; Desire for Improvement; Resilience; Social Support  Sleep  Sleep: Sleep: Good Number of Hours of Sleep: 10  Physical Exam: Physical Exam Vitals and nursing note reviewed.  HENT:     Nose: Nose normal.     Mouth/Throat:     Pharynx: Oropharynx is clear.  Eyes:     Pupils: Pupils are equal, round, and reactive to light.  Cardiovascular:     Rate and Rhythm: Normal rate.     Pulses: Normal pulses.  Pulmonary:     Effort: Pulmonary effort is normal.  Genitourinary:    Comments: Deferred Musculoskeletal:        General: Normal range of motion.     Cervical back: Normal range of motion.  Skin:    General: Skin is warm and dry.  Neurological:     General: No focal deficit present.     Mental Status: He is alert and oriented to person, place, and time.    Review of Systems  Constitutional:  Negative for chills, diaphoresis and fever.  HENT:  Negative for congestion and sore throat.   Respiratory:  Negative for cough, shortness of breath and wheezing.   Cardiovascular:  Negative for chest pain and palpitations.  Gastrointestinal:  Negative for abdominal pain, constipation, diarrhea, heartburn, nausea and  vomiting.  Genitourinary:  Negative for dysuria.  Musculoskeletal:  Negative for joint pain and myalgias.  Neurological:  Negative for dizziness, tingling, tremors, sensory change, speech change, focal weakness, seizures, loss of consciousness, weakness and headaches.  Endo/Heme/Allergies:        Allergies: PCN, statin.  Psychiatric/Behavioral:  Positive for depression and substance abuse (Hx. benzodiazeine use disorder.). Negative for hallucinations, memory loss and suicidal ideas. The patient is nervous/anxious. The patient does not have insomnia.    Blood pressure 113/72, pulse 70, temperature 97.8 F (36.6 C), temperature source Oral, resp. rate 16, height 5\' 9"  (1.753 m), weight 76.7 kg, SpO2 94 %. Body mass index is 24.96 kg/m.  Treatment Plan Summary: Daily contact with patient to assess and evaluate symptoms and progress in treatment and Medication management.   Continue inpatient hospitalization. Will continue today 02/12/2022 plan as below except where it is noted.   Treatment Plan/Recommendations: 1. Admit for crisis management and stabilization, estimated length of stay 3-5 days.    2. Medication management to reduce current symptoms to base line and improve the patient's overall level of functioning: See [facility] for plan of care.     Plan. - Continue gabapentin from 300 mg to 400 mg po tid for depression. - Continue Cymbalta 60 mg po daily for depression. - Continue 50 mg po Q 8 hours prn for anxiety. - Increased Seroquel from  200 mg to 300 mg po Q hs. - Continue gabapentin 400 mg po tid for anxiety/agitation..   Other prn medications.  - acetaminophen 650 mg po Q 6 hours prn for pain/fever. - Mylanta 30 ml po Q 6 hours prn for indigestion.  - MOM 15 ml po q daily prn for constipation.    3. Treat health problems as indicated: -  Continue Lisinopril 5 mg po daily for HTN.    4. Develop treatment plan to decrease risk of relapse upon discharge and the need for  readmission.  5. Psycho-social education regarding relapse prevention and self care.  6. Health care follow up as needed for medical problems.  7. Review, reconcile, and reinstate any pertinent home medications for other health issues where appropriate. 8. Call for consults with hospitalist for any additional specialty patient care services as needed.  Armandina Stammer, NP, pmhnp, fnp-bc 02/12/2022, 9:54 AM

## 2022-02-12 NOTE — Progress Notes (Signed)
Pt is A&OX4, calm, cooperative, denies suicidal ideations, denies homicidal ideations, denies auditory hallucinations and denies visual hallucinations. Pt verbally agrees to approach staff if these become apparent and before harming self or others. Pt denies experiencing nightmares. Mood and affect are congruent. Pt appetite is ok. No complaints of anxiety, distress, pain and/or discomfort at this time. Pt's memory appears to be grossly intact, and Pt hasn't displayed any injurious behaviors. Pt is medication compliant. There's no evidence of suicidal intent. Psychomotor activity was WNL. No s/s of Parkinson, Dystonia, Akathisia and/or Tardive Dyskinesia noted.

## 2022-02-13 DIAGNOSIS — F332 Major depressive disorder, recurrent severe without psychotic features: Principal | ICD-10-CM

## 2022-02-13 NOTE — [facility] Group Notes (Signed)
Adult Psychoeducational Group Note Date:  02/13/2022 Time:  0900-1000 Group Topic/Focus: PROGRESSIVE RELAXATION. A group where deep breathing is taught and tensing and relaxation muscle groups is used. Imagery is used as well.  Pts are asked to imagine 3 pillars that hold them up when they are not able to hold themselves up and to share that with the group.   Participation Level:  Active  Participation Quality:  Appropriate  Affect:  Appropriate  Cognitive:  Approprate  Insight: Improving  Engagement in Group:  Engaged  Modes of Intervention:  deep breathing, Imagery. Discussion  Additional Comments:  Rares energy at a 9/10. States faith, work and music hold him up and support him.   : Dione Housekeeper

## 2022-02-13 NOTE — Progress Notes (Signed)
[facility] MD Progress Note  02/13/2022 11:45 AM Zachary [name]  MRN:  [id number]  Reason for admission:  This is one of several psychiatric admissions in this [facility] for this 61 year old Caucasian male with hx of alcoholism, major depressive disorder, generalized anxiety disorder & substance abuse issues. He is know in this [facility] from his previous admissions for alcohol intoxications, suicidal ideations/attempt & worsening symptoms of depression/anxiety. Patient was last admitted/treated in the [facility] in May of 2023 & was apparently discharged from the old [facility] hospital two weeks ago after a week & half hospitalization. Patient has also been to other psychiatric hospitals with the surrounding area with similar complaints. He is currently being admitted to the [facility] with complaint of suicide attempt by overdose on clonazepam. His UDS result was positive for benzodiazepine & THC. He is in need of mood stabilization treatments.   Daily notes:  Zachary [name] is seen this morning. Chart reviewed. The chart findings discussed with the treatment team. He presents alert, oriented & aware of situation. He is visible on the unit, attending group sessions. He is fairly groomed, casually dressed in his own clothes. He  says "I'm doing & feeling much better. We have had two group sessions already this morning. One of the group session is about relaxation technique & the second session is about how to balance decision-making. Both the sessions are informative". Zachary [name] says part of his problem prior to coming to the hospital is running out of his medications. He says he feels relieved he is back on them. Says was feeling a little fidgety upon waking up this morning, took some hydroxyzine, now feels a lot better & relaxed. He says he slept well last night, about ten hours. He says he is taking & tolerating his treatment regimen, denies any side effects.  He stated yesterday that he is currently working with his sister to get his living  arrangement more conducive for him. He currently denies any SIHI, AVH, delusional thoughts or paranoia. He does not appear to be responding to any internal stimuli. There are no changes made on his current plan of care. We will continue current plan of care as already in progress below. Discharge plan is ongoing.  Principal Problem: MDD (major depressive disorder), recurrent severe, without psychosis (HCC)   Diagnosis: Principal Problem:   MDD (major depressive disorder), recurrent severe, without psychosis (HCC)  Total Time spent with patient:  25 minutes.  Past Psychiatric History: See H&P.  Past Medical History:  Past Medical History:  Diagnosis Date   Alcohol abuse    Anxiety    COPD (chronic obstructive pulmonary disease) (HCC)    Depression    Hypertension    Suicidal ideation     Past Surgical History:  Procedure Laterality Date   COLONOSCOPY     MOUTH SURGERY     NO PAST SURGERIES     Family History:  Family History  Problem Relation Age of Onset   Heart attack Paternal Grandfather    Stroke Mother    Heart failure Mother    Heart failure Father    Family Psychiatric  History: See H&P.  Social History:  Social History   Substance and Sexual Activity  Alcohol Use Yes   Comment: BAC was .23 on admission     Social History   Substance and Sexual Activity  Drug Use Yes   Types: Marijuana   Comment: UDS was negative    Social History   Socioeconomic History  Marital status: Divorced    Spouse name: Not on file   Number of children: 2   Years of education: Not on file   Highest education level: Not on file  Occupational History   Occupation: Product/process development scientist  Tobacco Use   Smoking status: Former    Packs/day: 2.00    Types: Cigarettes   Smokeless tobacco: Never  Vaping Use   Vaping Use: Not on file  Substance and Sexual Activity   Alcohol use: Yes    Comment: BAC was .23 on admission   Drug use: Yes    Types: Marijuana    Comment: UDS was  negative   Sexual activity: Not Currently  Other Topics Concern   Not on file  Social History Narrative   Pt stated that he lives alone; works as a Product/process development scientist   Social Determinants of Corporate investment banker Strain: Not on file  Food Insecurity: No Food Insecurity (02/10/2022)   Hunger Vital Sign    Worried About Running Out of Food in the Last Year: Never true    Ran Out of Food in the Last Year: Never true  Transportation Needs: No Transportation Needs (02/10/2022)   PRAPARE - Administrator, Civil Service (Medical): No    Lack of Transportation (Non-Medical): No  Physical Activity: Not on file  Stress: Not on file  Social Connections: Not on file   Additional Social History:   Sleep: Good  Appetite:  Good  Current Medications: Current Facility-Administered Medications  Medication Dose Route Frequency Provider Last Rate Last Admin   acetaminophen (TYLENOL) tablet 650 mg  650 mg Oral Q6H PRN Ntuen, Jesusita Oka, FNP       alum & mag hydroxide-simeth (MAALOX/MYLANTA) 200-200-20 MG/5ML suspension 30 mL  30 mL Oral Q4H PRN Ntuen, Jesusita Oka, FNP       DULoxetine (CYMBALTA) DR capsule 60 mg  60 mg Oral Daily Devlin Mcveigh, Nicole Kindred I, NP   60 mg at 02/13/22 0743   gabapentin (NEURONTIN) capsule 400 mg  400 mg Oral TID Armandina Stammer I, NP   400 mg at 02/13/22 0743   hydrOXYzine (ATARAX) tablet 50 mg  50 mg Oral TID PRN Armandina Stammer I, NP   50 mg at 02/13/22 0742   lisinopril (ZESTRIL) tablet 5 mg  5 mg Oral Daily Shannie Kontos, Nicole Kindred I, NP   5 mg at 02/13/22 0743   magnesium hydroxide (MILK OF MAGNESIA) suspension 30 mL  30 mL Oral Daily PRN Ntuen, Jesusita Oka, FNP       QUEtiapine (SEROQUEL) tablet 300 mg  300 mg Oral QHS Jorja Empie I, NP   300 mg at 02/12/22 2105   traZODone (DESYREL) tablet 50 mg  50 mg Oral QHS PRN Ntuen, Jesusita Oka, FNP   50 mg at 02/11/22 0101   ziprasidone (GEODON) injection 20 mg  20 mg Intramuscular Q12H PRN Ntuen, Jesusita Oka, FNP       Lab Results:  No results found  for this or any previous visit (from the past 48 hour(s)).  Blood Alcohol level:  Lab Results  Component Value Date   ETH <10 02/09/2022   ETH 240 (H) 01/02/2022   Metabolic Disorder Labs: Lab Results  Component Value Date   HGBA1C 5.1 02/11/2022   MPG 100 02/11/2022   MPG 93.93 07/22/2021   No results found for: "PROLACTIN" Lab Results  Component Value Date   CHOL 235 (H) 02/11/2022   TRIG 89 02/11/2022   HDL 54  02/11/2022   CHOLHDL 4.4 02/11/2022   VLDL 18 02/11/2022   LDLCALC 163 (H) 02/11/2022   LDLCALC 94 07/22/2021   Physical Findings: AIMS:  , ,  ,  ,    CIWA:    COWS:     Musculoskeletal: Strength & Muscle Tone: within normal limits Gait & Station: normal Patient leans: N/A  Psychiatric Specialty Exam:  Presentation  General Appearance:  Appropriate for Environment; Casual; Fairly Groomed  Eye Contact: Good  Speech: Normal Rate; Clear and Coherent  Speech Volume: Normal  Handedness: Right   Mood and Affect  Mood: -- ("Improving")  Affect: Congruent  Thought Process  Thought Processes: Goal Directed; Coherent  Descriptions of Associations:Intact  Orientation:Full (Time, Place and Person)  Thought Content:Logical  History of Schizophrenia/Schizoaffective disorder:No  Duration of Psychotic Symptoms: NA Hallucinations:Hallucinations: None  Ideas of Reference:None  Suicidal Thoughts:Suicidal Thoughts: No SI Active Intent and/or Plan: Without Intent; Without Plan; Without Means to Carry Out; Without Access to Means SI Passive Intent and/or Plan: Without Intent; Without Plan; Without Means to Carry Out; Without Access to Means  Homicidal Thoughts:No data recorded  Sensorium  Memory: Immediate Good; Recent Good; Remote Good  Judgment: Good  Insight: Good  Executive Functions  Concentration: Good  Attention Span: Good  Recall: Good  Fund of Knowledge: Good  Language: Good  Psychomotor Activity  Psychomotor  Activity: Psychomotor Activity: Normal  Assets  Assets: Communication Skills; Desire for Improvement; Housing; Resilience; Physical Health; Social Support  Sleep  Sleep: Sleep: Good Number of Hours of Sleep: 10  Physical Exam: Physical Exam Vitals and nursing note reviewed.  HENT:     Nose: Nose normal.     Mouth/Throat:     Pharynx: Oropharynx is clear.  Eyes:     Pupils: Pupils are equal, round, and reactive to light.  Cardiovascular:     Rate and Rhythm: Normal rate.     Pulses: Normal pulses.  Pulmonary:     Effort: Pulmonary effort is normal.  Genitourinary:    Comments: Deferred Musculoskeletal:        General: Normal range of motion.     Cervical back: Normal range of motion.  Skin:    General: Skin is warm and dry.  Neurological:     General: No focal deficit present.     Mental Status: He is alert and oriented to person, place, and time.    Review of Systems  Constitutional:  Negative for chills, diaphoresis and fever.  HENT:  Negative for congestion and sore throat.   Respiratory:  Negative for cough, shortness of breath and wheezing.   Cardiovascular:  Negative for chest pain and palpitations.  Gastrointestinal:  Negative for abdominal pain, constipation, diarrhea, heartburn, nausea and vomiting.  Genitourinary:  Negative for dysuria.  Musculoskeletal:  Negative for joint pain and myalgias.  Neurological:  Negative for dizziness, tingling, tremors, sensory change, speech change, focal weakness, seizures, loss of consciousness, weakness and headaches.  Endo/Heme/Allergies:        Allergies: PCN, statin.  Psychiatric/Behavioral:  Positive for depression and substance abuse (Hx. benzodiazeine use disorder.). Negative for hallucinations, memory loss and suicidal ideas. The patient is nervous/anxious. The patient does not have insomnia.    Blood pressure 137/85, pulse (!) 52, temperature 97.7 F (36.5 C), temperature source Oral, resp. rate 16, height 5\' 9"   (1.753 m), weight 76.7 kg, SpO2 96 %. Body mass index is 24.96 kg/m.  Treatment Plan Summary: Daily contact with patient to assess and evaluate symptoms and  progress in treatment and Medication management.   Continue inpatient hospitalization. Will continue today 02/13/2022 plan as below except where it is noted.   Treatment Plan/Recommendations: 1. Admit for crisis management and stabilization, estimated length of stay 3-5 days.    2. Medication management to reduce current symptoms to base line and improve the patient's overall level of functioning: See [facility] for plan of care.     Plan. - Continue gabapentin 400 mg po tid for depression. - Continue Cymbalta 60 mg po daily for depression. - Continue Hydroxyzine 50 mg po Q 8 hours prn for anxiety. - Continue Seroquel 300 mg po Q hs. - Continue gabapentin 400 mg po tid for anxiety/agitation..   Other prn medications.  - acetaminophen 650 mg po Q 6 hours prn for pain/fever. - Mylanta 30 ml po Q 6 hours prn for indigestion.  - MOM 15 ml po q daily prn for constipation.    3. Treat health problems as indicated: -  Continue Lisinopril 5 mg po daily for HTN.    4. Develop treatment plan to decrease risk of relapse upon discharge and the need for readmission.  5. Psycho-social education regarding relapse prevention and self care.  6. Health care follow up as needed for medical problems.  7. Review, reconcile, and reinstate any pertinent home medications for other health issues where appropriate. 8. Call for consults with hospitalist for any additional specialty patient care services as needed.  Armandina Stammer, NP, pmhnp, fnp-bc 02/13/2022, 11:45 AMPatient ID: Georgina Peer, male   DOB: 1960-10-25, 2 y.o.   MRN: [id number]

## 2022-02-13 NOTE — Group Note (Signed)
[facility] LCSW Group Therapy Note   Group Date: 02/13/2022 Start Time: 1015 End Time: 1115   Type of Therapy/Topic:  Group Therapy:  Balance in Life  Participation Level:  Active   Description of Group:    This group will address the concept of balance and how it feels and looks when one is unbalanced. Patients will be encouraged to process areas in their lives that are out of balance, and identify reasons for remaining unbalanced. Facilitators will guide patients utilizing problem- solving interventions to address and correct the stressor making their life unbalanced. Understanding and applying boundaries will be explored and addressed for obtaining  and maintaining a balanced life. Patients will be encouraged to explore ways to assertively make their unbalanced needs known to significant others in their lives, using other group members and facilitator for support and feedback.  Therapeutic Goals: Patient will identify two or more emotions or situations they have that consume much of in their lives. Patient will identify signs/triggers that life has become out of balance:  Patient will identify two ways to set boundaries in order to achieve balance in their lives:  Patient will demonstrate ability to communicate their needs through discussion and/or role plays  Summary of Patient Progress:    Patient was open and interactive throughout the session.    Therapeutic Modalities:   Cognitive Behavioral Therapy Solution-Focused Therapy Assertiveness Training   Tennile Styles M Shreyas Piatkowski, LCSW 

## 2022-02-13 NOTE — [facility] Group Notes (Signed)
Adult Psychoeducational Group  Date:  02/13/2022 Time:  1100-1200  Group Topic/Focus: Continuation of the group from Saturday. Looking at the lists that were created and talking about what needs to be done with the homework of 30 positives about themselves.                                     Talking about taking their power back and helping themselves to develop a positive self esteem.      Participation Quality:  Appropriate  Affect:  Appropriate  Cognitive:  Oriented  Insight: Improving  Engagement in Group:  Engaged  Modes of Intervention:  Activity, Discussion, Education, and Support  Additional Comments:  Rates energy at an 6.5 Participated fully in the group.  Dione Housekeeper

## 2022-02-13 NOTE — Progress Notes (Signed)
D. Pt presents with an improved mood- denies SI/HI and A/VH- Pt requested some Vistaril for anxiety in the morning, stated, "I feel a little shaky on the inside, but I tend to be more anxious in the mornings." Pt has been visible in the milieu, observed attending groups A. Labs and vitals monitored. Pt given and educated on medications. Pt supported emotionally and encouraged to express concerns and ask questions.   R. Pt remains safe with 15 minute checks. Will continue POC.

## 2022-02-13 NOTE — Progress Notes (Signed)
Patient attended wrap-up group. Pt rated day 9 out of 10 due to being able to function mentally. Pt reports he is in a clearer head space.

## 2022-02-14 DIAGNOSIS — F1010 Alcohol abuse, uncomplicated: Secondary | ICD-10-CM

## 2022-02-14 DIAGNOSIS — F411 Generalized anxiety disorder: Secondary | ICD-10-CM | POA: Insufficient documentation

## 2022-02-14 NOTE — Progress Notes (Signed)
   02/14/22 2200  Psych Admission Type (Psych Patients Only)  Admission Status Voluntary  Psychosocial Assessment  Patient Complaints None  Eye Contact Fair  Facial Expression Flat  Affect Other (Comment) (wnl)  Speech Logical/coherent  Interaction Assertive  Motor Activity Slow  Appearance/Hygiene Unremarkable  Behavior Characteristics Cooperative;Calm  Mood Pleasant  Thought Process  Coherency WDL  Content WDL  Delusions None reported or observed  Perception WDL  Hallucination None reported or observed  Judgment WDL  Confusion None  Danger to Self  Current suicidal ideation? Denies  Agreement Not to Harm Self Yes  Description of Agreement Verbal  Danger to Others  Danger to Others None reported or observed

## 2022-02-14 NOTE — [facility] Group Notes (Signed)
Pt did not attend AA group tonight

## 2022-02-14 NOTE — Plan of Care (Signed)
  Problem: Activity: Goal: Interest or engagement in activities will improve 02/14/2022 1222 by Melvenia Needles, RN Outcome: Progressing 02/14/2022 1222 by Melvenia Needles, RN Outcome: Progressing Goal: Sleeping patterns will improve 02/14/2022 1222 by Melvenia Needles, RN Outcome: Progressing 02/14/2022 1222 by Melvenia Needles, RN Outcome: Progressing   Problem: Education: Goal: Knowledge of [facility] Education information/materials will improve 02/14/2022 1222 by Melvenia Needles, RN Outcome: Progressing 02/14/2022 1222 by Melvenia Needles, RN Outcome: Progressing Goal: Emotional status will improve 02/14/2022 1222 by Melvenia Needles, RN Outcome: Progressing 02/14/2022 1222 by Melvenia Needles, RN Outcome: Progressing Goal: Mental status will improve 02/14/2022 1222 by Melvenia Needles, RN Outcome: Progressing 02/14/2022 1222 by Melvenia Needles, RN Outcome: Progressing Goal: Verbalization of understanding the information provided will improve 02/14/2022 1222 by Melvenia Needles, RN Outcome: Progressing 02/14/2022 1222 by Melvenia Needles, RN Outcome: Progressing

## 2022-02-14 NOTE — Group Note (Signed)
Recreation Therapy Group Note   Group Topic:Stress Management  Group Date: 02/14/2022 Start Time: 0935 End Time: 0948 Facilitators: Cas Tracz-McCall, LRT,CTRS Location: [address] Dayroom   Goal Area(s) Addresses:  Patient will identify positive stress management techniques. Patient will identify benefits of using stress management post d/c.   Group Description:  Meditation.  LRT discussed meditation with patients to give them insights as to what it entails.  LRT then played a meditation that focused on being resilient in the face of adversity.  Patients were to listen and follow along as meditation played to fully engage in how the speaker was guiding them.  Patients were also encouraged to use other platforms to engage in stress management such as Youtube, apps, internet, etc.   Affect/Mood: Appropriate   Participation Level: Active   Participation Quality: Independent   Behavior: Appropriate   Speech/Thought Process: Focused   Insight: Good   Judgement: Good   Modes of Intervention: Meditation   Patient Response to Interventions:  Engaged   Education Outcome:  Acknowledges education and In group clarification offered    Clinical Observations/Individualized Feedback: Pt attended and participated in group session.   Plan: Continue to engage patient in RT group sessions 2-3x/week.   Gagandeep Kossman-McCall, LRT,CTRS 02/14/2022 11:29 AM

## 2022-02-14 NOTE — Progress Notes (Signed)
   02/14/22 0800  Psych Admission Type (Psych Patients Only)  Admission Status Voluntary  Psychosocial Assessment  Patient Complaints None  Eye Contact Fair  Facial Expression Flat  Affect Anxious  Speech Logical/coherent  Interaction Assertive  Motor Activity Slow  Appearance/Hygiene Unremarkable  Behavior Characteristics Cooperative;Calm  Mood Pleasant  Thought Process  Coherency WDL  Content WDL  Delusions None reported or observed  Perception WDL  Hallucination None reported or observed  Judgment WDL  Confusion None  Danger to Self  Current suicidal ideation? Denies  Agreement Not to Harm Self Yes  Description of Agreement verbal contract for safety  Danger to Others  Danger to Others None reported or observed

## 2022-02-14 NOTE — Progress Notes (Signed)
[facility] MD Progress Note  02/14/2022 3:58 PM Zachary [name]  MRN:  [id number] Subjective:    Zachary [name] is a 61 year old, Caucasian male with a past psychiatric history significant for history of alcoholism, major depressive disorder, generalized anxiety disorder, and substance abuse who was voluntarily admitted to [facility] due to suicidal attempt  from overdose on clonazepam.  Patient was assessed by Ileene Musa, PA-C for reevaluation. Patient is currently being managed on the following psychiatric medications:  Gabapentin 400 mg 3 times daily Cymbalta 60 mg daily Seroquel 300 mg at bedtime Hydroxyzine 50 mg at bedtime as needed Trazodone 50 mg at bedtime as needed  Patient reports that he feels great since being admitted to this facility.  Patient denies any adverse side effects from his current medication regimen.  Patient denies depression or anxiety and feels prepared to go.  Patient denies any new stressors at this time. Patient denies suicidal or homicidal ideations.  Patient further denies auditory or visual hallucinations and does not appear to be responding to internal/external stimuli.  Patient denies paranoia or delusions.  Patient endorses good sleep and good appetite.  Patient is casually dressed, alert and oriented x 4.  Patient is calm, cooperative, and fully engaged in conversation during the encounter.  Patient maintains good eye contact.  Patient's speech is clear, coherent, and with normal rate.  Patient's thought process is organized.  Patient's thought content is coherent.  Patient denies depression or anxiety and exhibits euthymic affect.  Patient denies suicidal ideations.  Patient does not appear to be responding to internal/external stimuli.  Principal Problem: MDD (major depressive disorder), recurrent severe, without psychosis ([place]) Diagnosis: Principal Problem:   MDD (major depressive disorder), recurrent severe, without psychosis ([place]) Active Problems:   Alcohol  abuse   Anxiety state  Total Time spent with patient: 15 minutes  Past Psychiatric History:  Substance abuse Alcohol abuse Tobacco dependence Anxiety Major depressive disorder  Past Medical History:  Past Medical History:  Diagnosis Date   Alcohol abuse    Anxiety    COPD (chronic obstructive pulmonary disease) (HCC)    Depression    Hypertension    Suicidal ideation     Past Surgical History:  Procedure Laterality Date   COLONOSCOPY     MOUTH SURGERY     NO PAST SURGERIES     Family History:  Family History  Problem Relation Age of Onset   Heart attack Paternal Grandfather    Stroke Mother    Heart failure Mother    Heart failure Father    Family Psychiatric  History:  Patient reports that his mother, brother, and father suffer from alcoholism  Social History:  Social History   Substance and Sexual Activity  Alcohol Use Yes   Comment: BAC was .23 on admission     Social History   Substance and Sexual Activity  Drug Use Yes   Types: Marijuana   Comment: UDS was negative    Social History   Socioeconomic History   Marital status: Divorced    Spouse name: Not on file   Number of children: 2   Years of education: Not on file   Highest education level: Not on file  Occupational History   Occupation: Clinical biochemist  Tobacco Use   Smoking status: Former    Packs/day: 2.00    Types: Cigarettes   Smokeless tobacco: Never  Vaping Use   Vaping Use: Not on file  Substance and Sexual Activity   Alcohol use:  Yes    Comment: BAC was .23 on admission   Drug use: Yes    Types: Marijuana    Comment: UDS was negative   Sexual activity: Not Currently  Other Topics Concern   Not on file  Social History Narrative   Pt stated that he lives alone; works as a Clinical biochemist   Social Determinants of Radio broadcast assistant Strain: Not on file  Food Insecurity: No [place] (02/10/2022)   Hunger Vital Sign    Worried About Running Out of  Food in the Last Year: Never true    Ran Out of Food in the Last Year: Never true  Transportation Needs: No Transportation Needs (02/10/2022)   PRAPARE - Hydrologist (Medical): No    Lack of Transportation (Non-Medical): No  Physical Activity: Not on file  Stress: Not on file  Social Connections: Not on file   Additional Social History:  See H&P  Sleep: Good  Appetite:  Good  Current Medications: Current Facility-Administered Medications  Medication Dose Route Frequency Provider Last Rate Last Admin   acetaminophen (TYLENOL) tablet 650 mg  650 mg Oral Q6H PRN Ntuen, Kris Hartmann, FNP       alum & mag hydroxide-simeth (MAALOX/MYLANTA) 200-200-20 MG/5ML suspension 30 mL  30 mL Oral Q4H PRN Ntuen, Kris Hartmann, FNP       DULoxetine (CYMBALTA) DR capsule 60 mg  60 mg Oral Daily Derel Mcglasson, Herbert Pun I, NP   60 mg at 02/14/22 0752   gabapentin (NEURONTIN) capsule 400 mg  400 mg Oral TID Lindell Spar I, NP   400 mg at 02/14/22 1407   hydrOXYzine (ATARAX) tablet 50 mg  50 mg Oral TID PRN Lindell Spar I, NP   50 mg at 02/13/22 0742   lisinopril (ZESTRIL) tablet 5 mg  5 mg Oral Daily Kaiea Esselman, Herbert Pun I, NP   5 mg at 02/14/22 [id number]   magnesium hydroxide (MILK OF MAGNESIA) suspension 30 mL  30 mL Oral Daily PRN Ntuen, Kris Hartmann, FNP       QUEtiapine (SEROQUEL) tablet 300 mg  300 mg Oral QHS Ondra Deboard, Agnes I, NP   300 mg at 02/13/22 2122   traZODone (DESYREL) tablet 50 mg  50 mg Oral QHS PRN Ntuen, Kris Hartmann, FNP   50 mg at 02/11/22 0101   ziprasidone (GEODON) injection 20 mg  20 mg Intramuscular Q12H PRN Ntuen, Kris Hartmann, FNP        Lab Results: No results found for this or any previous visit (from the past 48 hour(s)).  Blood Alcohol level:  Lab Results  Component Value Date   ETH <10 02/09/2022   ETH 240 (H) [id number]    Metabolic Disorder Labs: Lab Results  Component Value Date   HGBA1C 5.1 02/11/2022   MPG 100 02/11/2022   MPG 93.93 07/22/2021   No results found for:  "PROLACTIN" Lab Results  Component Value Date   CHOL 235 (H) 02/11/2022   TRIG 89 02/11/2022   HDL 54 02/11/2022   CHOLHDL 4.4 02/11/2022   VLDL 18 02/11/2022   LDLCALC 163 (H) 02/11/2022   LDLCALC 94 07/22/2021    Physical Findings: AIMS:  , ,  ,  ,    CIWA:    COWS:     Musculoskeletal: Strength & Muscle Tone: within normal limits Gait & Station: normal Patient leans: N/A  Psychiatric Specialty Exam:  Presentation  General Appearance:  Appropriate for Environment; Casual  Eye Contact: Good  Speech: Clear and Coherent; Normal Rate  Speech Volume: Normal  Handedness: Right   Mood and Affect  Mood: Euthymic  Affect: Congruent   Thought Process  Thought Processes: Coherent; Goal Directed  Descriptions of Associations:Intact  Orientation:Full (Time, Place and Person)  Thought Content:Logical  History of Schizophrenia/Schizoaffective disorder:No  Duration of Psychotic Symptoms:No data recorded Hallucinations:Hallucinations: None  Ideas of Reference:None  Suicidal Thoughts:Suicidal Thoughts: No SI Active Intent and/or Plan: Without Intent; Without Plan; Without Means to Carry Out; Without Access to Means SI Passive Intent and/or Plan: Without Intent; Without Plan; Without Means to Carry Out; Without Access to Means  Homicidal Thoughts:Homicidal Thoughts: No   Sensorium  Memory: Immediate Good; Recent Good; Remote Good  Judgment: Good  Insight: Good   Executive Functions  Concentration: Good  Attention Span: Good  Recall: Good  Fund of Knowledge: Good  Language: Good   Psychomotor Activity  Psychomotor Activity: Psychomotor Activity: Normal   Assets  Assets: Communication Skills; Desire for Improvement; Housing; Resilience; Social Support   Sleep  Sleep: Sleep: Good Number of Hours of Sleep: 10    Physical Exam: Physical Exam Constitutional:      Appearance: Normal appearance.  HENT:     Head:  Normocephalic and atraumatic.     Nose: Nose normal.     Mouth/Throat:     Mouth: Mucous membranes are moist.  Eyes:     Extraocular Movements: Extraocular movements intact.     Pupils: Pupils are equal, round, and reactive to light.  Cardiovascular:     Rate and Rhythm: Normal rate and regular rhythm.  Pulmonary:     Effort: Pulmonary effort is normal.     Breath sounds: Normal breath sounds.  Abdominal:     General: Abdomen is flat.  Musculoskeletal:        General: Normal range of motion.     Cervical back: Normal range of motion and neck supple.  Skin:    General: Skin is warm and dry.  Neurological:     General: No focal deficit present.     Mental Status: He is alert and oriented to person, place, and time.  Psychiatric:        Attention and Perception: Attention and perception normal. He does not perceive auditory or visual hallucinations.        Mood and Affect: Mood and affect normal. Mood is not anxious or depressed.        Speech: Speech normal.        Behavior: Behavior normal. Behavior is cooperative.        Thought Content: Thought content normal. Thought content is not paranoid or delusional. Thought content does not include homicidal or suicidal ideation.        Cognition and Memory: Cognition and memory normal.        Judgment: Judgment normal.    Review of Systems  Constitutional: Negative.   HENT: Negative.    Eyes: Negative.   Respiratory: Negative.    Cardiovascular: Negative.   Gastrointestinal: Negative.   Skin: Negative.   Neurological: Negative.   Psychiatric/Behavioral:  Positive for substance abuse. Negative for depression, hallucinations and suicidal ideas. The patient is not nervous/anxious and does not have insomnia.    Blood pressure 137/85, pulse 77, temperature 97.7 F (36.5 C), temperature source Oral, resp. rate 16, height 5\' 9"  (1.753 m), weight 76.7 kg, SpO2 96 %. Body mass index is 24.96 kg/m.   Treatment Plan Summary:  Daily  contact with patient to assess  and evaluate symptoms and progress in treatment and Medication management  Plan:  Patient denies experiencing any adverse side effects from the use of his current patient regimen.  Patient denies depression or anxiety and is prepared to be discharged for tomorrow.  Provider strongly recommends that the patient should not receive clonazepam for the future due to misuse, abuse, and increased tolerance to the medication.  Provider to make note of strong recommendation against patient being treated with benzodiazepines in discharge summary.  Safety and Monitoring: Voluntary admission to inpatient psychiatric unit for safety, stabilization and treatment Daily contact with patient to assess and evaluate symptoms and progress in treatment Patient's case to be discussed in multi-disciplinary team meeting Observation Level : q15 minute checks Vital signs: q12 hours Precautions: safety  Diagnosis: Principal Problem:   MDD (major depressive disorder), recurrent severe, without psychosis ([place]) Active Problems:   Alcohol abuse   Anxiety state  #Major depressive disorder, recurrent severe, without psychosis -Continue Cymbalta 60 mg daily for depression -Continue Seroquel 300 mg at bedtime for depression and mood stability  #Anxiety -Continue Cymbalta 60 mg daily for anxiety -Continue gabapentin 400 mg 3 times daily for anxiety and agitation -Continue hydroxyzine 50 mg 3 times daily as needed for anxiety  #Hypertension -Continue lisinopril 5 mg daily for blood pressure  As needed medications: -Patient to continue taking Tylenol 650 mg every 6 hours as needed for mild pain -Patient to continue taking Maalox/Mylanta 30 mL every 4 hours as needed for indigestion -Patient to continue taking Milk of Magnesia 30 mL as needed for mild constipation -Continue Hydroxyzine 25 mg 3 times daily as needed for anxiety -Continue Trazodone 50 mg at bedtime as needed for insomnia    Discharge Planning: Social work and case management to assist with discharge planning and identification of hospital follow-up needs prior to discharge Estimated LOS: 5-7 days Discharge Concerns: Need to establish a safety plan; Medication compliance and effectiveness Discharge Goals: Return home with outpatient referrals for mental health follow-up including medication management/psychotherapy  I certify that inpatient services furnished can reasonably be expected to improve the patient's condition.  Malachy Mood, PA 02/14/2022, 3:58 PM

## 2022-02-14 NOTE — [facility] Group Notes (Signed)
Adult Psychoeducational Group Note  Date:  02/14/2022 Time:  9:32 AM  Group Topic/Focus:  Goals Group:   The focus of this group is to help patients establish daily goals to achieve during treatment and discuss how the patient can incorporate goal setting into their daily lives to aide in recovery.  Participation Level:  Active  Participation Quality:  Appropriate  Affect:  Appropriate  Cognitive:  Appropriate  Insight: Appropriate  Engagement in Group:  Engaged  Modes of Intervention:  Discussion   Donell Beers 02/14/2022, 9:32 AM

## 2022-02-14 NOTE — [facility] Group Notes (Signed)
Spiritual care group on grief and loss facilitated by chaplain Dyanne Carrel, [facility]  Group Goal:  Support / Education around grief and loss  Members engage in facilitated group support and psycho-social education.  Group Description:  Following introductions and group rules, group members engaged in facilitated group dialog and support around topic of loss, with particular support around experiences of loss in their lives. Group Identified types of loss (relationships / self / things) and identified patterns, circumstances, and changes that precipitate losses. Reflected on thoughts / feelings around loss, normalized grief responses, and recognized variety in grief experience. Group noted Worden's four tasks of grief in discussion.  Group drew on Adlerian / Rogerian, narrative, MI,  Patient Progress: [facility] attended group and actively engaged and participated in the group conversation and activities.  He shared about his coping strategies and shared that he has a special way he is memorializing a friend who just died last week.  [address] B[facility] Pager, [phone]

## 2022-02-14 NOTE — Progress Notes (Signed)
D- Patient alert and oriented. Patient affect/mood.reported as improving.  Denies SI, HI, AVH, and pain. 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 At this time report anxiety 0/10 and depression 0/10. A- Scheduled medications administered to patient, per MD orders. Support and encouragement provided.  Routine safety checks conducted every 15 minutes.  Patient informed to notify staff with problems or concerns. R- No adverse drug reactions noted. Patient contracts for safety at this time. Patient compliant with medications and treatment plan. Patient receptive, calm, and cooperative. Patient interacts well with others on the unit.  Patient remains safe at this time.

## 2022-02-15 DIAGNOSIS — F331 Major depressive disorder, recurrent, moderate: Secondary | ICD-10-CM

## 2022-02-15 MED ORDER — QUETIAPINE FUMARATE 300 MG PO TABS: 300.0000 mg | ORAL_TABLET | Freq: Every day | ORAL | 0 refills | Status: DC

## 2022-02-15 MED ORDER — TRAZODONE HCL 50 MG PO TABS: 50.0000 mg | ORAL_TABLET | Freq: Every evening | ORAL | 0 refills | Status: DC | PRN

## 2022-02-15 MED ORDER — LISINOPRIL 5 MG PO TABS: 5.0000 mg | ORAL_TABLET | Freq: Every day | ORAL | 0 refills | Status: DC

## 2022-02-15 MED ORDER — HYDROXYZINE HCL 50 MG PO TABS: 50.0000 mg | ORAL_TABLET | Freq: Three times a day (TID) | ORAL | 0 refills | Status: DC | PRN

## 2022-02-15 MED ORDER — GABAPENTIN 400 MG PO CAPS: 400.0000 mg | ORAL_CAPSULE | Freq: Three times a day (TID) | ORAL | 0 refills | Status: DC

## 2022-02-15 MED ORDER — DULOXETINE HCL 60 MG PO CPEP: 60.0000 mg | ORAL_CAPSULE | Freq: Every day | ORAL | 0 refills | Status: DC

## 2022-02-15 NOTE — Progress Notes (Signed)
Discharge note: RN met with pt and reviewed pt's discharge instructions. Pt verbalized understanding of discharge instructions and pt did not have any questions. RN reviewed and provided pt with a copy of Suicide Safety Plan, SRA, AVS and Transition Record. RN returned pt's belongings to pt. Pt denied SI/HI/AVH and voiced no concerns. Pt was appreciative of the care pt received at [facility]. Patient discharged to the lobby without incident.

## 2022-02-15 NOTE — [facility] Suicide Risk Assessment (Signed)
Suicide Risk Assessment  Discharge Assessment    [facility] Discharge Suicide Risk Assessment   Principal Problem: MDD (major depressive disorder), recurrent episode, moderate (HCC) Discharge Diagnoses: Principal Problem:   MDD (major depressive disorder), recurrent episode, moderate (HCC) Active Problems:   Alcohol abuse   Anxiety state   HPI:  Zachary [name] is a 61 year old, Caucasian male with a past psychiatric history significant for history of alcoholism, major depressive disorder, generalized anxiety disorder, and substance abuse who was voluntarily admitted to [facility] due to suicidal attempt from medication overdose.   HOSPITAL COURSE:  During the patient's hospitalization, patient had extensive initial psychiatric evaluation, and follow-up psychiatric evaluations every day.  Psychiatric diagnoses provided upon initial assessment: major depressive disorder (recurrent severe without psychosis), anxiety, and alcohol abuse  Patient's psychiatric medications were adjusted on admission:  -Started Seroquel 200 mg at bedtime -Started Gabapentin 300 mg 3 times daily -Started Hydroxyzine 25 mg 3 times daily as needed -Started Cymbalta 60 mg daily -Started Trazodone 50 mg at bedtime as needed  During the hospitalization, other adjustments were made to the patient's psychiatric medication regimen:  -Increased Seroquel to 300 mg at bedtime -Increased Gabapentin to 400 mg 3 times daily -Increased Hydroxyzine to 50 mg 3 times daily as needed -Continued Cymbalta 60 mg daily -Continued Trazodone 50 mg at bedtime as needed  Patient's care was discussed during the interdisciplinary team meeting every day during the hospitalization.  The patient denied having side effects to prescribed psychiatric medication.  Gradually, patient started adjusting to milieu. The patient was evaluated each day by a clinical provider to ascertain response to treatment. Improvement was noted by the patient's  report of decreasing symptoms, improved sleep and appetite, affect, medication tolerance, behavior, and participation in unit programming.  Patient was asked each day to complete a self inventory noting mood, mental status, pain, new symptoms, anxiety and concerns.    Symptoms were reported as significantly decreased or resolved completely by discharge.   On day of discharge, the patient reports that their mood is stable. The patient denied having suicidal thoughts for more than 48 hours prior to discharge.  Patient denies having homicidal thoughts.  Patient denies having auditory hallucinations.  Patient denies any visual hallucinations or other symptoms of psychosis. The patient was motivated to continue taking medication with a goal of continued improvement in mental health.   The patient reports their target psychiatric symptoms of major depressive disorder and anxiety responded well to the psychiatric medications, and the patient reports overall benefit other psychiatric hospitalization. Supportive psychotherapy was provided to the patient. The patient also participated in regular group therapy while hospitalized. Coping skills, problem solving as well as relaxation therapies were also part of the unit programming.  Labs were reviewed with the patient, and abnormal results were discussed with the patient.  The patient is able to verbalize their individual safety plan to this provider.  # It is recommended to the patient to continue psychiatric medications as prescribed, after discharge from the hospital.    # It is recommended to the patient to follow up with your outpatient psychiatric provider and PCP.  # It was discussed with the patient, the impact of alcohol, drugs, tobacco have been there overall psychiatric and medical wellbeing, and total abstinence from substance use was recommended the patient.ed.  # Prescriptions provided or sent directly to preferred pharmacy at discharge. Patient  agreeable to plan. Given opportunity to ask questions. Appears to feel comfortable with discharge.    #  In the event of worsening symptoms, the patient is instructed to call the crisis hotline, 911 and or go to the nearest ED for appropriate evaluation and treatment of symptoms. To follow-up with primary care provider for other medical issues, concerns and or health care needs  # Patient was discharged home with a plan to follow up as noted below.   Given patient's past history of substance abuse and the reason for their most recent psychiatric admission, provider highly recommends that patient should not receive clonazepam or any form of benzodiazepine for the management of anxiety or sleep disturbances.   Total Time spent with patient: 20 minutes  Musculoskeletal: Strength & Muscle Tone: within normal limits Gait & Station: normal Patient leans: N/A  Psychiatric Specialty Exam  Presentation  General Appearance:  Appropriate for Environment; Fairly Groomed; Casual  Eye Contact: Good  Speech: Clear and Coherent; Normal Rate  Speech Volume: Normal  Handedness: Right   Mood and Affect  Mood: Euthymic  Duration of Depression Symptoms: Less than two weeks  Affect: Congruent   Thought Process  Thought Processes: Coherent; Goal Directed; Linear  Descriptions of Associations:Intact  Orientation:Full (Time, Place and Person)  Thought Content:Logical; WDL  History of Schizophrenia/Schizoaffective disorder:No  Duration of Psychotic Symptoms:No data recorded Hallucinations:Hallucinations: None  Ideas of Reference:None  Suicidal Thoughts:Suicidal Thoughts: No  Homicidal Thoughts:Homicidal Thoughts: No   Sensorium  Memory: Immediate Good; Recent Good; Remote Good  Judgment: Good  Insight: Good   Executive Functions  Concentration: Good  Attention Span: Good  Recall: Good  Fund of Knowledge: Good  Language: Good   Psychomotor Activity   Psychomotor Activity: Psychomotor Activity: Normal   Assets  Assets: Communication Skills; Desire for Improvement; Housing; Resilience; Social Support   Sleep  Sleep: Sleep: Good   Physical Exam: Physical Exam Constitutional:      Appearance: Normal appearance.  HENT:     Head: Normocephalic and atraumatic.     Nose: Nose normal.     Mouth/Throat:     Mouth: Mucous membranes are moist.  Eyes:     Extraocular Movements: Extraocular movements intact.     Pupils: Pupils are equal, round, and reactive to light.  Cardiovascular:     Rate and Rhythm: Normal rate and regular rhythm.  Pulmonary:     Effort: Pulmonary effort is normal.     Breath sounds: Normal breath sounds.  Abdominal:     General: Abdomen is flat.  Musculoskeletal:        General: Normal range of motion.     Cervical back: Normal range of motion and neck supple.  Skin:    General: Skin is warm and dry.  Neurological:     General: No focal deficit present.     Mental Status: He is alert and oriented to person, place, and time.  Psychiatric:        Attention and Perception: Attention and perception normal. He does not perceive auditory or visual hallucinations.        Mood and Affect: Mood and affect normal. Mood is not anxious or depressed.        Speech: Speech normal.        Behavior: Behavior normal. Behavior is cooperative.        Thought Content: Thought content normal. Thought content is not paranoid or delusional. Thought content does not include homicidal or suicidal ideation.        Cognition and Memory: Cognition and memory normal.        Judgment:  Judgment normal.    Review of Systems  Constitutional: Negative.   HENT: Negative.    Eyes: Negative.   Respiratory: Negative.    Cardiovascular: Negative.   Gastrointestinal: Negative.   Skin: Negative.   Neurological: Negative.   Psychiatric/Behavioral:  Positive for substance abuse. Negative for depression, hallucinations and suicidal  ideas. The patient is not nervous/anxious and does not have insomnia.    Blood pressure 122/78, pulse 78, temperature 98.2 F (36.8 C), temperature source Oral, resp. rate 16, height 5\' 9"  (1.753 m), weight 76.7 kg, SpO2 95 %. Body mass index is 24.96 kg/m.  Mental Status Per Nursing Assessment::   On Admission:  NA  Demographic factors:  Male Loss Factors:  Loss of significant relationship, Decline in physical health Historical Factors:  NA Risk Reduction Factors:  Religious beliefs about death, Living with another person, especially a relative, Employed  Suicide Risk:  Mild: There are no identifiable plans, no associated intent, mild dysphoria and related symptoms, good self-control (both objective and subjective assessment), few other risk factors, and identifiable protective factors, including available and accessible social support.   Follow-up Information     [facility]. Go on 02/28/2022.   Specialty: Behavioral Health Why: You have an appointment for therapy services on 02/28/22 at 1:00 pm.  You also have an appointment for medication management services on 03/01/22 at  2:00 pm.  The appointments will be held in person. Contact information: [address] [place] [phone]        [facility] Follow up on 02/21/2022.   Specialty: Behavioral Health Why: You have an appointment on 02/21/22 at 2:00 pm with this provider for substance use intensive therapy services (SAIOP).  This program meets in-person on M,W,F from 9 am to 12 pm and runs for 8 - 12 weeks.  Clients can also receive individual and family therapy, and MAT.  There is weekly drug testing.  The program is abstinence-based and AA, NA, and Smart Recovery, etc. attendance is encouraged.  For questions, please call [phone]. Contact information: [address] [place] [phone]                Plan Of  Care/Follow-up recommendations:   Activity: As tolerated  Diet: Heart healthy  Other: -Follow-up with your outpatient psychiatric provider -instructions on appointment date, time, and address (location) are provided to you in discharge paperwork.   -Take your psychiatric medications as prescribed at discharge - instructions are provided to you in the discharge paperwork.    -Follow-up with outpatient primary care doctor and other specialists -for management of preventative medicine and chronic medical disease, including:   -Testing: Follow-up with outpatient provider for abnormal lab results:  none   -Recommend abstinence from alcohol, tobacco, and other illicit drug use at discharge.    -If your psychiatric symptoms recur, worsen, or if you have side effects to your psychiatric medications, call your outpatient psychiatric provider, 911, [phone] or go to the nearest emergency department.   -If suicidal thoughts recur, call your outpatient psychiatric provider, 911, [phone] or go to the nearest emergency department.   [phone], [place] 02/15/2022, 9:23 AM

## 2022-02-15 NOTE — Progress Notes (Signed)
  [facility] Adult Case Management Discharge Plan :  Will you be returning to the same living situation after discharge:  Yes,  -Zachary [name] [phone] (sister ) At discharge, do you have transportation home?: No.Taxi Waiver Do you have the ability to pay for your medications: Yes,  Insured  Release of information consent forms completed and in the chart;  Patient's signature needed at discharge.  Patient to Follow up at:  Follow-up Information     [facility]. Go on 02/28/2022.   Specialty: Behavioral Health Why: You have an appointment for therapy services on 02/28/22 at 1:00 pm.  You also have an appointment for medication management services on 03/01/22 at  2:00 pm.  The appointments will be held in person. Contact information: [address] [place] [address] [phone]        [facility] Follow up on 02/21/2022.   Specialty: Behavioral Health Why: You have an appointment on 02/21/22 at 2:00 pm with this provider for substance use intensive therapy services (SAIOP).  This program meets in-person on M,W,F from 9 am to 12 pm and runs for 8 - 12 weeks.  Clients can also receive individual and family therapy, and MAT.  There is weekly drug testing.  The program is abstinence-based and AA, NA, and Smart Recovery, etc. attendance is encouraged.  For questions, please call [phone]. Contact information: [address] [place] [address] [phone]                Next level of care provider has access to [facility] Link:yes  Safety Planning and Suicide Prevention discussed: Yes,  Zachary [name] [phone] (sister     Has patient been referred to the Quitline?: Patient refused referral  Patient has been referred for addiction treatment: Yes Patient to continue working towards treatment goals after discharge. Patient no longer meets criteria for inpatient criteria per attending  physician. Continue taking medications as prescribed, nursing to provide instructions at discharge. Follow up with all scheduled appointments.   Seanpaul Preece S Karliah Kowalchuk, LCSW 02/15/2022, 10:49 AM

## 2022-02-15 NOTE — Discharge Summary (Signed)
Physician Discharge Summary Note  Patient:  Zachary [name] is an 61 y.o., male MRN:  [id number] DOB:  04-26-1960 Patient phone:  [phone] (home)  Patient address:   [address] [place] [address],  Total Time spent with patient: 20 minutes  Date of Admission:  02/10/2022 Date of Discharge: 02/15/2022  Reason for Admission:    This is one of several psychiatric admissions in this [facility] for this 61 year old Caucasian male with hx of alcoholism, major depressive disorder, generalized anxiety disorder & substance abuse issues. He is know in this [facility] from his previous admissions for alcohol intoxications, suicidal ideations/attempt & worsening symptoms of depression/anxiety. Patient was last admitted/treated in the [facility] in May of 2023 & was apparently discharged from the old [facility] hospital two weeks ago after a week & half hospitalization. Patient has also been to other psychiatric hospitals with the surrounding area with similar complaints. He is currently being admitted to the [facility] with complaint of suicide attempt by overdose on clonazepam. His UDS result was positive for benzodiazepine & THC. He is in need of mood stabilization treatments.   During this admission evaluation, Zachary [name] reports, "I was taken to the [facility] on Wednesday (two days ago) by the EMS. I called 911 while at my sister's house where I currently live. I called 911 because I could not live at my sister's house any longer. It is not conducive for me. I'm living in their attic without any heat. I can no longer work to make money to support myself because I have bad arthritis to my hands & joints. I was also in a relationship with a lady that did not go well because she was looking for perfection & I ain't perfect. She bad mouths me & called me names such as stinker. I was on medications for my mental health issues (Cymbalta, gabapentin, clonazepam, Naltrexone & Seroquel) Seroquel was started while I was at the  [facility] for suicidal thoughts because of my living situation. I was discharged from there two weeks ago after a week & half hospitalization. The only medicines that worked for me were the clonazepam & Seroquel. The other crappy medicines did not help. On the Wednesday that I was taken to the [facility], I was having an awful day. My truck broke down. It over-heated, I did not sleep a wink the night prior because my sister & her husband stayed up watching tv till 2:00 am. I can no longer take it or handle my situation any more. It is not helping my mental health. I feel messed up in my head. I will need a long term treatment to fix my mental health. I suffer from bad depression/anxiety & always having negative thoughts. While I was not feeling like myself on that Wednesday coupled with poor sleep the night prior because I did not have my Seroquel, I took 12 - 14 tablets of clonazepam to calm myself. That much clonazepam did not make me sleepy, drowsy or confused. It did not alter my memory or my judgement at all. I'm not having any clonazepam withdrawal withdrawal symptoms. I need to get back on it".   Objective: Zachary [name] presents during this evaluation, alert, oriented x 3. He is aware of situation. He is making a good eye contact, verbally responsive & able to make needs known. He presents disheveled, fixated on getting back on Clonazepam & Seroquel. He believes that those are the two medications that woke for him.  His other home medications include Cymbalta, gabapentin & Naltrexone. He says he does not have any alcohol use disorder. Says he only will drink if he was feeling very anxious & does not have his klonopin. Does not want to get back on Naltrexone. Instructed & encouraged Zachary [name] to agree to resume gabapentin & Cymbalta. Explained to him that as much depressed as he is reporting symptoms of worsening depression/anxiety coupled with his ongoing personal stressors, he still will need  the Cymbalta & gabapentin. He is in agreement. Patient is explained that the [place] will not be restarted due to his tendency to misuse it. He currently denies any SIHI, AVH, delusional thoughts or paranoia. He does not appear to be responding to any internal stimuli. There are no signs of substance withdrawal symptoms. He denies any alcohol or benzodiazepine related withdrawal seizures. Patient does have hx of suicidal ideations & attempts.  Principal Problem: MDD (major depressive disorder), recurrent episode, moderate ([place]) Discharge Diagnoses: Principal Problem:   MDD (major depressive disorder), recurrent episode, moderate (HCC) Active Problems:   Alcohol abuse   Anxiety state   Past Psychiatric History:  Substance abuse Alcohol abuse Tobacco dependence Anxiety Major depressive disorder  Past Medical History:  Past Medical History:  Diagnosis Date   Alcohol abuse    Anxiety    COPD (chronic obstructive pulmonary disease) (HCC)    Depression    Hypertension    Suicidal ideation     Past Surgical History:  Procedure Laterality Date   COLONOSCOPY     MOUTH SURGERY     NO PAST SURGERIES     Family History:  Family History  Problem Relation Age of Onset   Heart attack Paternal Grandfather    Stroke Mother    Heart failure Mother    Heart failure Father    Family Psychiatric  History:  Patient reports that his mother, brother, and father suffer from alcoholism   Social History:  Social History   Substance and Sexual Activity  Alcohol Use Yes   Comment: BAC was .23 on admission     Social History   Substance and Sexual Activity  Drug Use Yes   Types: Marijuana   Comment: UDS was negative    Social History   Socioeconomic History   Marital status: Divorced    Spouse name: Not on file   Number of children: 2   Years of education: Not on file   Highest education level: Not on file  Occupational History   Occupation: Clinical biochemist  Tobacco Use    Smoking status: Former    Packs/day: 2.00    Types: Cigarettes   Smokeless tobacco: Never  Vaping Use   Vaping Use: Not on file  Substance and Sexual Activity   Alcohol use: Yes    Comment: BAC was .23 on admission   Drug use: Yes    Types: Marijuana    Comment: UDS was negative   Sexual activity: Not Currently  Other Topics Concern   Not on file  Social History Narrative   Pt stated that he lives alone; works as a Clinical biochemist   Social Determinants of Radio broadcast assistant Strain: Not on file  Food Insecurity: No [place] (02/10/2022)   Hunger Vital Sign    Worried About Running Out of Food in the Last Year: Never true    Ran Out of Food in the Last Year: Never true  Transportation Needs: No Transportation Needs (02/10/2022)   [place] - Transportation  Lack of Transportation (Medical): No    Lack of Transportation (Non-Medical): No  Physical Activity: Not on file  Stress: Not on file  Social Connections: Not on file    Hospital Course:    During the patient's hospitalization, patient had extensive initial psychiatric evaluation, and follow-up psychiatric evaluations every day.   Psychiatric diagnoses provided upon initial assessment: major depressive disorder (recurrent severe without psychosis), anxiety, and alcohol abuse   Patient's psychiatric medications were adjusted on admission:  -Started Seroquel 200 mg at bedtime -Started Gabapentin 300 mg 3 times daily -Started Hydroxyzine 25 mg 3 times daily as needed -Started Cymbalta 60 mg daily -Started Trazodone 50 mg at bedtime as needed   During the hospitalization, other adjustments were made to the patient's psychiatric medication regimen:  -Increased Seroquel to 300 mg at bedtime -Increased Gabapentin to 400 mg 3 times daily -Increased Hydroxyzine to 50 mg 3 times daily as needed -Continued Cymbalta 60 mg daily -Continued Trazodone 50 mg at bedtime as needed   Patient's care was discussed  during the interdisciplinary team meeting every day during the hospitalization.   The patient denied having side effects to prescribed psychiatric medication.   Gradually, patient started adjusting to milieu. The patient was evaluated each day by a clinical provider to ascertain response to treatment. Improvement was noted by the patient's report of decreasing symptoms, improved sleep and appetite, affect, medication tolerance, behavior, and participation in unit programming.  Patient was asked each day to complete a self inventory noting mood, mental status, pain, new symptoms, anxiety and concerns.     Symptoms were reported as significantly decreased or resolved completely by discharge.    On day of discharge, the patient reports that their mood is stable. The patient denied having suicidal thoughts for more than 48 hours prior to discharge.  Patient denies having homicidal thoughts.  Patient denies having auditory hallucinations.  Patient denies any visual hallucinations or other symptoms of psychosis. The patient was motivated to continue taking medication with a goal of continued improvement in mental health.    The patient reports their target psychiatric symptoms of major depressive disorder and anxiety responded well to the psychiatric medications, and the patient reports overall benefit other psychiatric hospitalization. Supportive psychotherapy was provided to the patient. The patient also participated in regular group therapy while hospitalized. Coping skills, problem solving as well as relaxation therapies were also part of the unit programming.   Labs were reviewed with the patient, and abnormal results were discussed with the patient.   The patient is able to verbalize their individual safety plan to this provider.   # It is recommended to the patient to continue psychiatric medications as prescribed, after discharge from the hospital.     # It is recommended to the patient to follow  up with your outpatient psychiatric provider and PCP.   # It was discussed with the patient, the impact of alcohol, drugs, tobacco have been there overall psychiatric and medical wellbeing, and total abstinence from substance use was recommended the patient.ed.   # Prescriptions provided or sent directly to preferred pharmacy at discharge. Patient agreeable to plan. Given opportunity to ask questions. Appears to feel comfortable with discharge.    # In the event of worsening symptoms, the patient is instructed to call the crisis hotline, 911 and or go to the nearest ED for appropriate evaluation and treatment of symptoms. To follow-up with primary care provider for other medical issues, concerns and or health care needs   #  Patient was discharged home with a plan to follow up as noted below.  Given patient's past history of substance abuse and the reason for their most recent psychiatric admission, provider highly recommends that patient should not receive clonazepam or any form of benzodiazepine for the management of anxiety or sleep disturbances.  Physical Findings: AIMS:  , ,  ,  ,    CIWA:    COWS:     Musculoskeletal: Strength & Muscle Tone: within normal limits Gait & Station: normal Patient leans: N/A   Psychiatric Specialty Exam:  Presentation  General Appearance:  Appropriate for Environment; Fairly Groomed; Casual  Eye Contact: Good  Speech: Clear and Coherent; Normal Rate  Speech Volume: Normal  Handedness: Right   Mood and Affect  Mood: Euthymic  Affect: Congruent   Thought Process  Thought Processes: Coherent; Goal Directed; Linear  Descriptions of Associations:Intact  Orientation:Full (Time, Place and Person)  Thought Content:Logical; WDL  History of Schizophrenia/Schizoaffective disorder:No  Duration of Psychotic Symptoms:No data recorded Hallucinations:Hallucinations: None  Ideas of Reference:None  Suicidal Thoughts:Suicidal Thoughts:  No  Homicidal Thoughts:Homicidal Thoughts: No   Sensorium  Memory: Immediate Good; Recent Good; Remote Good  Judgment: Good  Insight: Good   Executive Functions  Concentration: Good  Attention Span: Good  Recall: Good  Fund of Knowledge: Good  Language: Good   Psychomotor Activity  Psychomotor Activity: Psychomotor Activity: Normal   Assets  Assets: Communication Skills; Desire for Improvement; Housing; Resilience; Social Support   Sleep  Sleep: Sleep: Good    Physical Exam: Physical Exam Constitutional:      Appearance: Normal appearance.  HENT:     Head: Normocephalic and atraumatic.     Nose: Nose normal.     Mouth/Throat:     Mouth: Mucous membranes are moist.  Eyes:     Extraocular Movements: Extraocular movements intact.     Pupils: Pupils are equal, round, and reactive to light.  Cardiovascular:     Rate and Rhythm: Normal rate and regular rhythm.  Pulmonary:     Effort: Pulmonary effort is normal.     Breath sounds: Normal breath sounds.  Abdominal:     General: Abdomen is flat.  Musculoskeletal:        General: Normal range of motion.     Cervical back: Normal range of motion and neck supple.  Skin:    General: Skin is warm and dry.  Neurological:     General: No focal deficit present.     Mental Status: He is alert and oriented to person, place, and time.  Psychiatric:        Attention and Perception: Attention and perception normal. He does not perceive auditory or visual hallucinations.        Mood and Affect: Mood and affect normal. Mood is not anxious or depressed.        Speech: Speech normal.        Behavior: Behavior normal. Behavior is cooperative.        Thought Content: Thought content normal. Thought content is not paranoid or delusional. Thought content does not include homicidal or suicidal ideation.        Cognition and Memory: Cognition and memory normal.        Judgment: Judgment normal.    Review of  Systems  Constitutional: Negative.   HENT: Negative.    Eyes: Negative.   Respiratory: Negative.    Cardiovascular: Negative.   Gastrointestinal: Negative.   Skin: Negative.   Neurological: Negative.  Psychiatric/Behavioral:  Positive for substance abuse. Negative for depression, hallucinations and suicidal ideas. The patient is not nervous/anxious and does not have insomnia.    Blood pressure 122/78, pulse 78, temperature 98.2 F (36.8 C), temperature source Oral, resp. rate 16, height 5\' 9"  (1.753 m), weight 76.7 kg, SpO2 95 %. Body mass index is 24.96 kg/m.   Social History   Tobacco Use  Smoking Status Former   Packs/day: 2.00   Types: Cigarettes  Smokeless Tobacco Never   Tobacco Cessation:  Prescription not provided because: patient did not utilize tobacco cessation products during their admission.   Blood Alcohol level:  Lab Results  Component Value Date   ETH <10 02/09/2022   ETH 240 (H) [id number]    Metabolic Disorder Labs:  Lab Results  Component Value Date   HGBA1C 5.1 02/11/2022   MPG 100 02/11/2022   MPG 93.93 07/22/2021   No results found for: "PROLACTIN" Lab Results  Component Value Date   CHOL 235 (H) 02/11/2022   TRIG 89 02/11/2022   HDL 54 02/11/2022   CHOLHDL 4.4 02/11/2022   VLDL 18 02/11/2022   LDLCALC 163 (H) 02/11/2022   LDLCALC 94 07/22/2021    See Psychiatric Specialty Exam and Suicide Risk Assessment completed by Attending Physician prior to discharge.  Discharge destination:  Home  Is patient on multiple antipsychotic therapies at discharge:  No   Has Patient had three or more failed trials of antipsychotic monotherapy by history:  No  Recommended Plan for Multiple Antipsychotic Therapies: NA  Discharge Instructions     Diet - low sodium heart healthy   Complete by: As directed    Increase activity slowly   Complete by: As directed       Allergies as of 02/15/2022       Reactions   Penicillins Other (See Comments)    Childhood allergy Reaction:  Unknown  Has patient had a PCN reaction causing immediate rash, facial/tongue/throat swelling, SOB or lightheadedness with hypotension: Unknown Has patient had a PCN reaction causing severe rash involving mucus membranes or skin necrosis: Unknown Has patient had a PCN reaction that required hospitalization Unknown Has patient had a PCN reaction occurring within the last 10 years: No If all of the above answers are "NO", then may proceed with Cephalosporin use.   Statins Palpitations        Medication List     STOP taking these medications    ALKA-SELTZER ANTACID PO   clonazePAM 1 MG tablet Commonly known as: KLONOPIN   EPINEPHrine 0.3 mg/0.3 mL Soaj injection Commonly known as: EPI-PEN   lisinopril-hydrochlorothiazide 10-12.5 MG tablet Commonly known as: ZESTORETIC   naltrexone 50 MG tablet Commonly known as: DEPADE   omeprazole 20 MG capsule Commonly known as: PRILOSEC   sucralfate 1 g tablet Commonly known as: Carafate       TAKE these medications      Indication  DULoxetine 60 MG capsule Commonly known as: CYMBALTA Take 1 capsule (60 mg total) by mouth daily. Start taking on: February 16, 2022 What changed:  medication strength how much to take  Indication: Major Depressive Disorder   gabapentin 400 MG capsule Commonly known as: NEURONTIN Take 1 capsule (400 mg total) by mouth 3 (three) times daily. What changed:  medication strength how much to take  Indication: Abuse or Misuse of Alcohol, Social Anxiety Disorder   hydrOXYzine 50 MG tablet Commonly known as: ATARAX Take 1 tablet (50 mg total) by mouth 3 (three) times daily  as needed for anxiety.  Indication: Feeling Anxious   lisinopril 5 MG tablet Commonly known as: ZESTRIL Take 1 tablet (5 mg total) by mouth daily. Start taking on: February 16, 2022  Indication: High Blood Pressure Disorder   QUEtiapine 300 MG tablet Commonly known as: SEROQUEL Take 1 tablet  (300 mg total) by mouth at bedtime. What changed:  medication strength how much to take  Indication: Major Depressive Disorder, Mood stabilization   traZODone 50 MG tablet Commonly known as: DESYREL Take 1 tablet (50 mg total) by mouth at bedtime as needed for sleep.  Indication: Trouble Sleeping        Follow-up Information     [facility]. Go on 02/28/2022.   Specialty: Behavioral Health Why: You have an appointment for therapy services on 02/28/22 at 1:00 pm.  You also have an appointment for medication management services on 03/01/22 at  2:00 pm.  The appointments will be held in person. Contact information: [address] [place] [address] [phone]        [facility] Follow up on 02/21/2022.   Specialty: Behavioral Health Why: You have an appointment on 02/21/22 at 2:00 pm with this provider for substance use intensive therapy services (SAIOP).  This program meets in-person on M,W,F from 9 am to 12 pm and runs for 8 - 12 weeks.  Clients can also receive individual and family therapy, and MAT.  There is weekly drug testing.  The program is abstinence-based and AA, NA, and Smart Recovery, etc. attendance is encouraged.  For questions, please call [phone]. Contact information: [address] [place] [address] [phone]                Follow-up recommendations:    Activity: As tolerated  Diet: Heart healthy  Other: -Follow-up with your outpatient psychiatric provider -instructions on appointment date, time, and address (location) are provided to you in discharge paperwork.   -Take your psychiatric medications as prescribed at discharge - instructions are provided to you in the discharge paperwork.    -Follow-up with outpatient primary care doctor and other specialists -for management of preventative medicine and chronic medical disease, including:   -Testing: Follow-up  with outpatient provider for abnormal lab results:  none   -Recommend abstinence from alcohol, tobacco, and other illicit drug use at discharge.    -If your psychiatric symptoms recur, worsen, or if you have side effects to your psychiatric medications, call your outpatient psychiatric provider, 911, [phone] or go to the nearest emergency department.   -If suicidal thoughts recur, call your outpatient psychiatric provider, 911, 988 or go to the nearest emergency department.  Signed: Meta Hatchet, PA 02/15/2022, 9:42 AM

## 2022-02-21 ENCOUNTER — Ambulatory Visit (HOSPITAL_COMMUNITY): Payer: Medicaid Other

## 2022-02-23 ENCOUNTER — Ambulatory Visit (HOSPITAL_COMMUNITY): Payer: Medicaid Other

## 2022-02-28 ENCOUNTER — Ambulatory Visit (HOSPITAL_COMMUNITY): Payer: Medicaid Other | Admitting: Mental Health

## 2022-02-28 ENCOUNTER — Telehealth (HOSPITAL_COMMUNITY): Payer: Self-pay | Admitting: Mental Health

## 2022-02-28 ENCOUNTER — Encounter (HOSPITAL_COMMUNITY): Payer: Self-pay

## 2022-02-28 NOTE — Telephone Encounter (Signed)
Therapist sent link via [facility] for virtual tele-assessment intake. No response after x 10 minutes. Contacted pt via telephone; no answer. Left message to reschedule.

## 2022-03-01 ENCOUNTER — Encounter (HOSPITAL_COMMUNITY): Payer: Self-pay

## 2022-03-01 ENCOUNTER — Ambulatory Visit (HOSPITAL_COMMUNITY): Payer: Medicaid Other | Admitting: Psychiatry

## 2022-03-01 ENCOUNTER — Telehealth (HOSPITAL_COMMUNITY): Payer: Self-pay | Admitting: Mental Health

## 2022-03-01 ENCOUNTER — Ambulatory Visit (HOSPITAL_COMMUNITY): Payer: Medicaid Other | Admitting: Mental Health

## 2022-03-01 NOTE — Telephone Encounter (Signed)
Therapist sent link via [facility] for virtual tele-assessment intake. No response after x 10 minutes. Contacted pt via telephone; no answer. Left message to reschedule.  

## 2023-09-30 IMAGING — DX DG WRIST COMPLETE 3+V*L*
3 series · 3 of 3 positions shown · non-contrast
Comparison: None Available.

CLINICAL DATA: Cut forearm with razor blade.

EXAM:
LEFT WRIST - COMPLETE 3+ VIEW

[wrist pa]
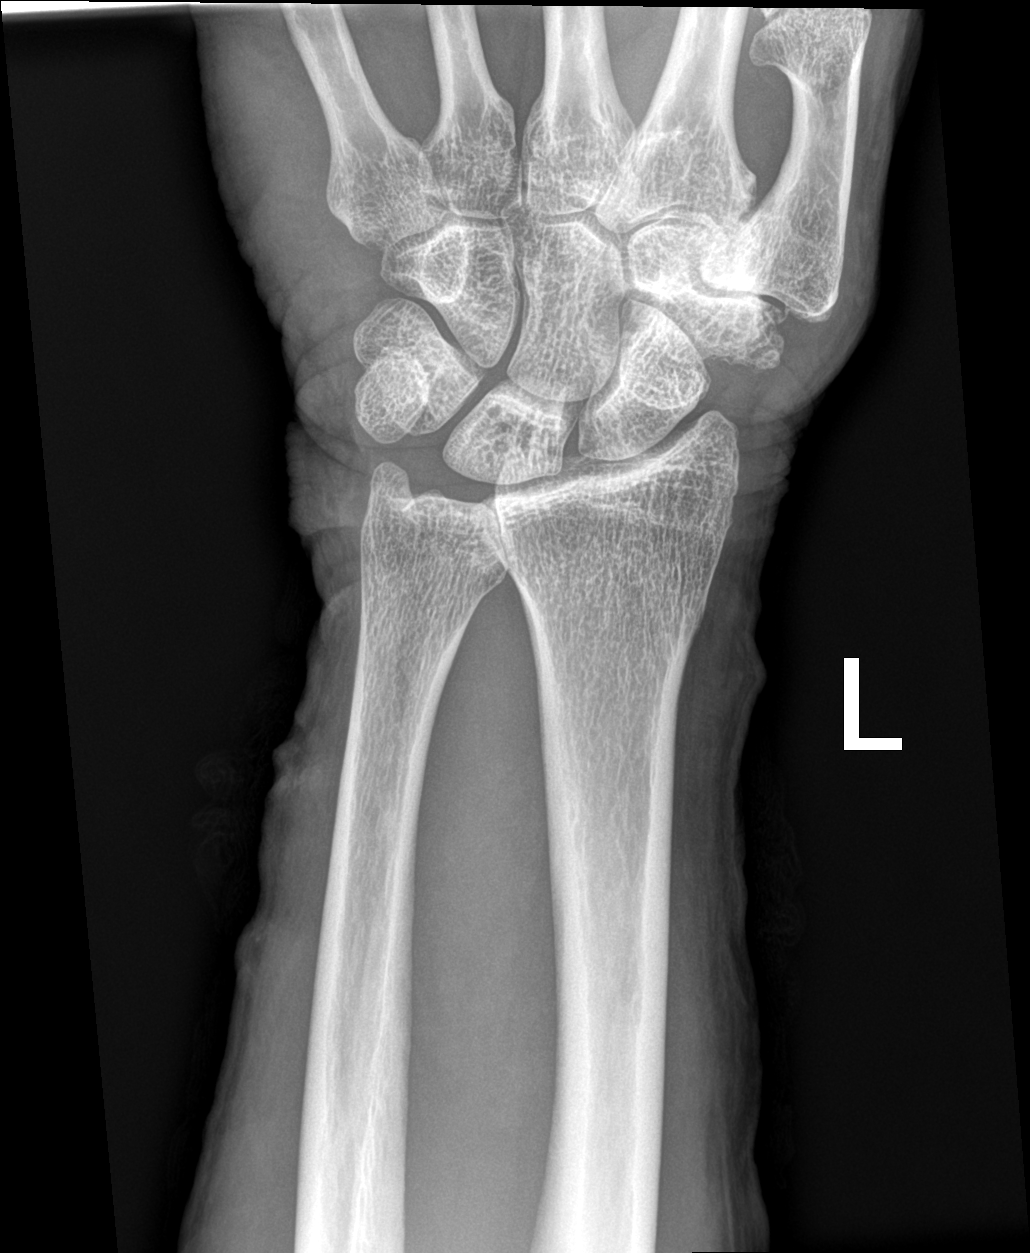

[wrist obl]
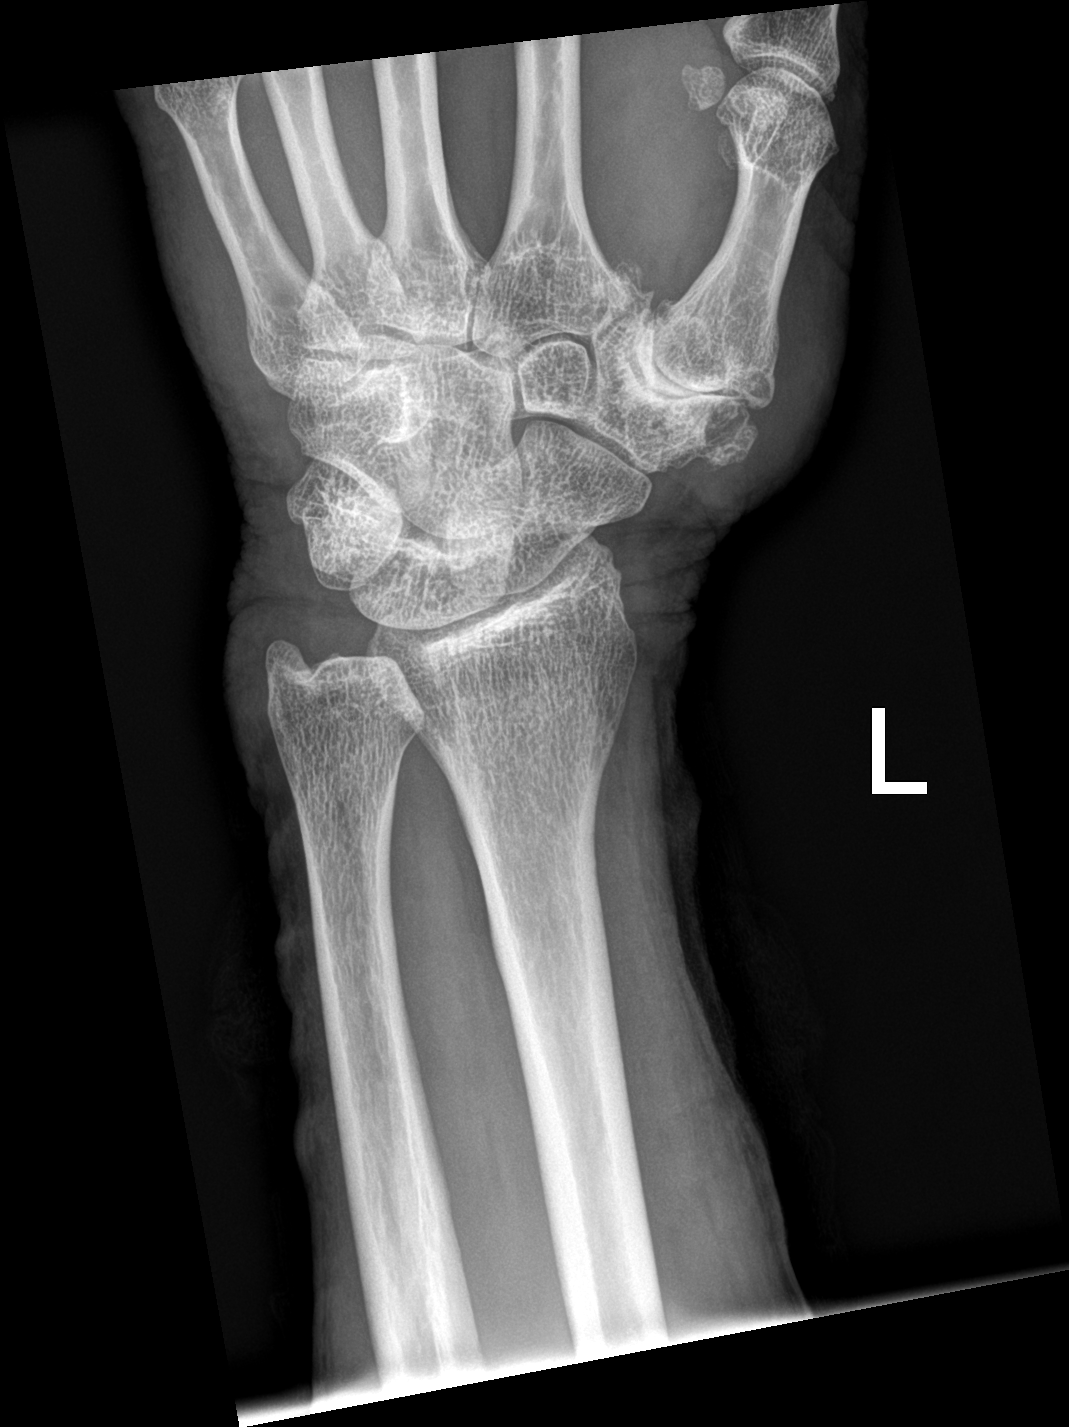

[wrist lat]
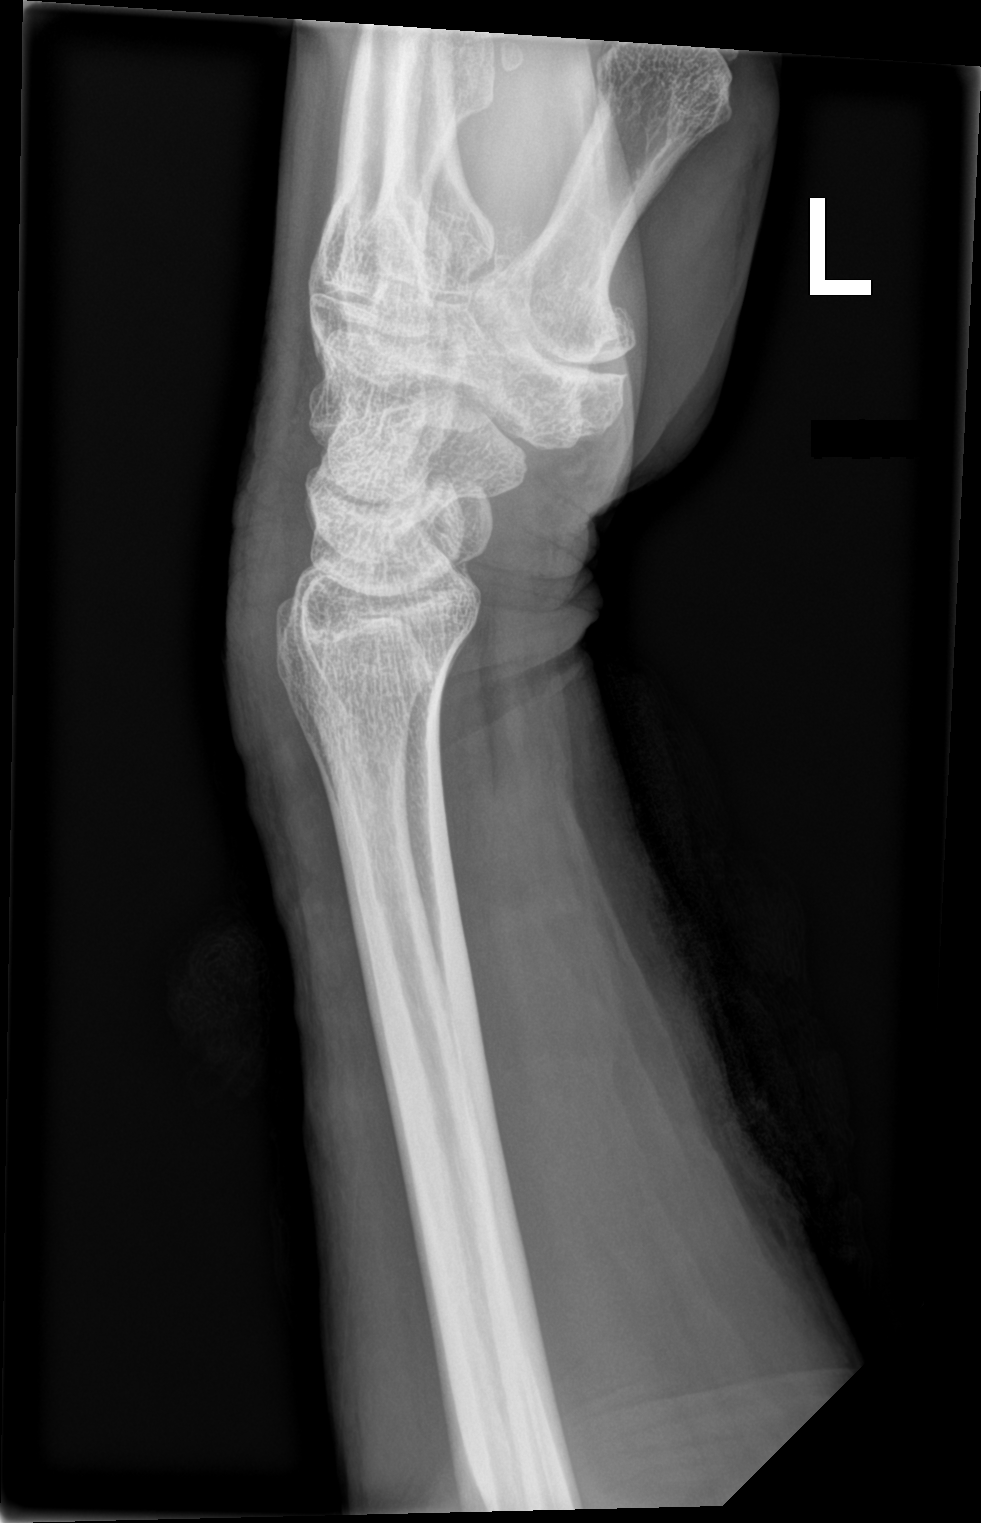

[3 of 3 positions shown; findings below may reference images not displayed]

FINDINGS: Advanced degenerative changes at the 1st carpometacarpal joint and
mild degenerative changes at the radiocarpal joint. No acute bony
abnormality. Specifically, no fracture, subluxation, or dislocation.
No radiopaque foreign body.
IMPRESSION: No fracture or foreign body.

## 2023-12-03 ENCOUNTER — Emergency Department (HOSPITAL_BASED_OUTPATIENT_CLINIC_OR_DEPARTMENT_OTHER): Admission: EM | Admit: 2023-12-03 | Discharge: 2023-12-03 | Disposition: A | Discharge status: Home / Self Care

## 2023-12-03 ENCOUNTER — Emergency Department (HOSPITAL_BASED_OUTPATIENT_CLINIC_OR_DEPARTMENT_OTHER)

## 2023-12-03 ENCOUNTER — Encounter (HOSPITAL_BASED_OUTPATIENT_CLINIC_OR_DEPARTMENT_OTHER): Payer: Self-pay

## 2023-12-03 ENCOUNTER — Other Ambulatory Visit: Payer: Self-pay

## 2023-12-03 DIAGNOSIS — I10 Essential (primary) hypertension: Secondary | ICD-10-CM | POA: Insufficient documentation

## 2023-12-03 DIAGNOSIS — J441 Chronic obstructive pulmonary disease with (acute) exacerbation: Secondary | ICD-10-CM | POA: Diagnosis not present

## 2023-12-03 DIAGNOSIS — Z79899 Other long term (current) drug therapy: Secondary | ICD-10-CM | POA: Diagnosis not present

## 2023-12-03 DIAGNOSIS — R059 Cough, unspecified: Secondary | ICD-10-CM | POA: Diagnosis present

## 2023-12-03 LAB — RESP PANEL BY RT-PCR (RSV, FLU A&B, COVID)  RVPGX2
Influenza A by PCR: NEGATIVE
Influenza B by PCR: NEGATIVE
Resp Syncytial Virus by PCR: NEGATIVE
SARS Coronavirus 2 by RT PCR: NEGATIVE

## 2023-12-03 MED ORDER — PREDNISONE 50 MG PO TABS: ORAL_TABLET | ORAL | 0 refills | Status: DC

## 2023-12-03 MED ORDER — PREDNISONE 50 MG PO TABS
60.0000 mg | ORAL_TABLET | Freq: Once | ORAL | Status: AC
  Administered 2023-12-03: 60 mg via ORAL
  Filled 2023-12-03: qty 1

## 2023-12-03 MED ORDER — DOXYCYCLINE HYCLATE 100 MG PO CAPS: 100.0000 mg | ORAL_CAPSULE | Freq: Two times a day (BID) | ORAL | 0 refills | Status: DC

## 2023-12-03 MED ORDER — IPRATROPIUM-ALBUTEROL 0.5-2.5 (3) MG/3ML IN SOLN
3.0000 mL | Freq: Once | RESPIRATORY_TRACT | Status: AC
  Administered 2023-12-03: 3 mL via RESPIRATORY_TRACT
  Filled 2023-12-03: qty 3

## 2023-12-03 MED ORDER — ALBUTEROL SULFATE HFA 108 (90 BASE) MCG/ACT IN AERS: 2.0000 | INHALATION_SPRAY | RESPIRATORY_TRACT | 0 refills | Status: AC | PRN

## 2023-12-03 NOTE — Discharge Instructions (Addendum)
 Your x-ray did not show any pneumonia.  I think that your symptoms are likely from COPD.  Please take the prednisone  daily.  Take the doxycycline  twice daily.  Use 2 puffs of the albuterol  every 4 hours over the next 24 hours.  Use as needed after.  Follow-up with your doctor.  If you do not have a doctor please call the referral line at the number provided.  Return to the ER for worsening symptoms.

## 2023-12-03 NOTE — ED Provider Notes (Signed)
 [place] EMERGENCY DEPARTMENT AT [facility] Provider Note   CSN: [id number] Arrival date & time: 12/03/23  9263     Patient presents with: Nasal Congestion   Zachary [name] is a 63 y.o. male.   63 year old male with past medical history of COPD and hypertension presenting to the emergency department today with cough and shortness of breath.  Patient states this been going on for the past few days.  The patient states that he started some doxycycline  that he had at home a few days ago.  He reports that his symptoms have persisted.  He has been having some nasal and chest congestion as well as cough now for the last 2 weeks.  He has not been using any albuterol .  He reports that he was diagnosed with COPD in 2019 but is not had any issues with it since.  He states that he is currently smoking.  He denies any associated chest pain.  He came to the ER today due to ongoing symptoms.        Prior to Admission medications   Medication Sig Start Date End Date Taking? Authorizing Provider  albuterol  (VENTOLIN  HFA) 108 (90 Base) MCG/ACT inhaler Inhale 2 puffs into the lungs every 4 (four) hours as needed for wheezing or shortness of breath. 12/03/23  Yes Ula Prentice SAUNDERS, MD  doxycycline  (VIBRAMYCIN ) 100 MG capsule Take 1 capsule (100 mg total) by mouth 2 (two) times daily. 12/03/23  Yes Ula Prentice SAUNDERS, MD  predniSONE  (DELTASONE ) 50 MG tablet Take 1 tablet by mouth daily 12/03/23  Yes Ula Prentice SAUNDERS, MD  DULoxetine  (CYMBALTA ) 60 MG capsule Take 1 capsule (60 mg total) by mouth daily. 02/16/22   Nwoko, Uchenna E, PA  gabapentin  (NEURONTIN ) 400 MG capsule Take 1 capsule (400 mg total) by mouth 3 (three) times daily. 02/15/22   Nwoko, Uchenna E, PA  hydrOXYzine  (ATARAX ) 50 MG tablet Take 1 tablet (50 mg total) by mouth 3 (three) times daily as needed for anxiety. 02/15/22   Nwoko, Uchenna E, PA  lisinopril  (ZESTRIL ) 5 MG tablet Take 1 tablet (5 mg total) by mouth daily. 02/16/22   Nwoko, Uchenna  E, PA  QUEtiapine  (SEROQUEL ) 300 MG tablet Take 1 tablet (300 mg total) by mouth at bedtime. 02/15/22   Nwoko, Uchenna E, PA  traZODone  (DESYREL ) 50 MG tablet Take 1 tablet (50 mg total) by mouth at bedtime as needed for sleep. 02/15/22   Nwoko, Uchenna E, PA    Allergies: Penicillins and Statins    Review of Systems  Respiratory:  Positive for cough and wheezing.   All other systems reviewed and are negative.   Updated Vital Signs BP (!) 154/89 (BP Location: Left Arm)   Pulse 68   Temp 97.8 F (36.6 C) (Oral)   Resp 16   Ht 5' 9 (1.753 m)   Wt 68 kg   SpO2 100%   BMI 22.15 kg/m   Physical Exam Vitals and nursing note reviewed.   Gen: NAD Eyes: PERRL, EOMI HEENT: no oropharyngeal swelling Neck: trachea midline Resp: Diminished with diffuse wheezes throughout all lung fields Card: RRR, no murmurs, rubs, or gallops Abd: nontender, nondistended Extremities: no calf tenderness, no edema Vascular: 2+ radial pulses bilaterally, 2+ DP pulses bilaterally Skin: no rashes Psyc: acting appropriately   (all labs ordered are listed, but only abnormal results are displayed) Labs Reviewed  RESP PANEL BY RT-PCR (RSV, FLU A&B, COVID)  [id number]    EKG: None  Radiology:  DG Chest Portable 1 View Result Date: 12/03/2023 CLINICAL DATA:  Coughing congestion. EXAM: PORTABLE CHEST 1 VIEW COMPARISON:  09/04/2021 FINDINGS: Lungs are hyperexpanded. The lungs are clear without focal pneumonia, edema, pneumothorax or pleural effusion. The cardiopericardial silhouette is within normal limits for size. No acute bony abnormality. IMPRESSION: Hyperexpansion without acute cardiopulmonary findings. Electronically Signed   By: Camellia Candle M.D.   On: 12/03/2023 08:05     Procedures   Medications Ordered in the ED  predniSONE  (DELTASONE ) tablet 60 mg (60 mg Oral Given 12/03/23 0846)  ipratropium-albuterol  (DUONEB) 0.5-2.5 (3) MG/3ML nebulizer solution 3 mL (3 mLs Nebulization Given 12/03/23 0836)                                     Medical Decision Making 63 year old male with past medical history of COPD and hypertension presenting to the emergency department today with cough and shortness of breath.  Patient does have wheezing here on exam.  I will further evaluate the patient here with a chest x-ray in addition to the patient's RSV/COVID/flu swab that was taken at triage.  Patient is wheezing here.  This likely due to COPD exacerbation.  Will give the patient prednisone  and a DuoNeb here and reevaluate for ultimate disposition.  Think the patient is feeling better with reassuring evaluation and that he may be discharged as his pulse ox is in the high 90s.  The patient's workup here is reassuring.  Chest x-ray shows hyperinflation but no other acute findings.  Viral testing is negative.  Patient's pulse ox after a DuoNeb is 100% on room air.  He will be discharged with return precautions.  Amount and/or Complexity of Data Reviewed Radiology: ordered.  Risk Prescription drug management.        Final diagnoses:  COPD with acute exacerbation [facility])    ED Discharge Orders          Ordered    predniSONE  (DELTASONE ) 50 MG tablet        12/03/23 0854    albuterol  (VENTOLIN  HFA) 108 (90 Base) MCG/ACT inhaler  Every 4 hours PRN        12/03/23 0854    doxycycline  (VIBRAMYCIN ) 100 MG capsule  2 times daily        12/03/23 0854               Ula Prentice SAUNDERS, MD 12/03/23 [phone]

## 2023-12-03 NOTE — ED Triage Notes (Signed)
 Pt states he has been dealing with congestion x2 weeks. He has been coughing up phlegm. States he feels weak and that he is getting worse. Pt has been taking doxycycline  that he had from a past infection.

## 2023-12-08 ENCOUNTER — Encounter (HOSPITAL_COMMUNITY): Payer: Self-pay

## 2023-12-08 ENCOUNTER — Other Ambulatory Visit: Payer: Self-pay

## 2023-12-08 ENCOUNTER — Emergency Department (EMERGENCY_DEPARTMENT_HOSPITAL)
Admission: EM | Admit: 2023-12-08 | Discharge: 2023-12-09 | Disposition: A | Discharge status: Other Acute Inpatient Hospital | Source: Home / Self Care | Attending: Emergency Medicine | Admitting: Emergency Medicine

## 2023-12-08 DIAGNOSIS — R059 Cough, unspecified: Secondary | ICD-10-CM | POA: Insufficient documentation

## 2023-12-08 DIAGNOSIS — R0981 Nasal congestion: Secondary | ICD-10-CM | POA: Insufficient documentation

## 2023-12-08 DIAGNOSIS — Y904 Blood alcohol level of 80-99 mg/100 ml: Secondary | ICD-10-CM | POA: Insufficient documentation

## 2023-12-08 DIAGNOSIS — Z7951 Long term (current) use of inhaled steroids: Secondary | ICD-10-CM | POA: Insufficient documentation

## 2023-12-08 DIAGNOSIS — Z79899 Other long term (current) drug therapy: Secondary | ICD-10-CM | POA: Insufficient documentation

## 2023-12-08 DIAGNOSIS — F419 Anxiety disorder, unspecified: Secondary | ICD-10-CM | POA: Insufficient documentation

## 2023-12-08 DIAGNOSIS — R45851 Suicidal ideations: Secondary | ICD-10-CM | POA: Insufficient documentation

## 2023-12-08 DIAGNOSIS — F430 Acute stress reaction: Secondary | ICD-10-CM | POA: Diagnosis not present

## 2023-12-08 DIAGNOSIS — F411 Generalized anxiety disorder: Secondary | ICD-10-CM | POA: Diagnosis not present

## 2023-12-08 DIAGNOSIS — F1090 Alcohol use, unspecified, uncomplicated: Secondary | ICD-10-CM | POA: Diagnosis not present

## 2023-12-08 DIAGNOSIS — F29 Unspecified psychosis not due to a substance or known physiological condition: Secondary | ICD-10-CM | POA: Insufficient documentation

## 2023-12-08 DIAGNOSIS — J449 Chronic obstructive pulmonary disease, unspecified: Secondary | ICD-10-CM | POA: Insufficient documentation

## 2023-12-08 DIAGNOSIS — F329 Major depressive disorder, single episode, unspecified: Secondary | ICD-10-CM | POA: Diagnosis not present

## 2023-12-08 LAB — COMPREHENSIVE METABOLIC PANEL WITH GFR
ALT: 19 U/L (ref 0–44)
AST: 16 U/L (ref 15–41)
Albumin: 3.6 g/dL (ref 3.5–5.0)
Alkaline Phosphatase: 62 U/L (ref 38–126)
Anion gap: 14 (ref 5–15)
BUN: 14 mg/dL (ref 8–23)
CO2: 19 mmol/L — ABNORMAL LOW (ref 22–32)
Calcium: 8.7 mg/dL — ABNORMAL LOW (ref 8.9–10.3)
Chloride: 103 mmol/L (ref 98–111)
Creatinine, Ser: 0.72 mg/dL (ref 0.61–1.24)
GFR, Estimated: >60 mL/min (ref >60)
Glucose, Bld: 90 mg/dL (ref 70–99)
Potassium: 3.3 mmol/L — ABNORMAL LOW (ref 3.5–5.1)
Sodium: 136 mmol/L (ref 135–145)
Total Bilirubin: 0.6 mg/dL (ref 0.0–1.2)
Total Protein: 5.8 g/dL — ABNORMAL LOW (ref 6.5–8.1)

## 2023-12-08 LAB — CBC WITH DIFFERENTIAL/PLATELET
Abs Immature Granulocytes: 0.25 K/uL — ABNORMAL HIGH (ref 0.00–0.07)
Basophils Absolute: 0.1 K/uL (ref 0.0–0.1)
Basophils Relative: 1 %
Eosinophils Absolute: 0.3 K/uL (ref 0.0–0.5)
Eosinophils Relative: 3 %
HCT: 42.2 % (ref 39.0–52.0)
Hemoglobin: 14.6 g/dL (ref 13.0–17.0)
Immature Granulocytes: 2 %
Lymphocytes Relative: 29 %
Lymphs Abs: 3 K/uL (ref 0.7–4.0)
MCH: 33 pg (ref 26.0–34.0)
MCHC: 34.6 g/dL (ref 30.0–36.0)
MCV: 95.5 fL (ref 80.0–100.0)
Monocytes Absolute: 1.3 K/uL — ABNORMAL HIGH (ref 0.1–1.0)
Monocytes Relative: 12 %
Neutro Abs: 5.5 K/uL (ref 1.7–7.7)
Neutrophils Relative %: 53 %
Platelets: 175 K/uL (ref 150–400)
RBC: 4.42 MIL/uL (ref 4.22–5.81)
RDW: 12.7 % (ref 11.5–15.5)
WBC: 10.4 K/uL (ref 4.0–10.5)
nRBC: 0 % (ref 0.0–0.2)

## 2023-12-08 LAB — SALICYLATE LEVEL: Salicylate Lvl: <7 mg/dL — ABNORMAL LOW (ref 7.0–30.0)

## 2023-12-08 LAB — ETHANOL: Alcohol, Ethyl (B): 84 mg/dL — ABNORMAL HIGH (ref <15)

## 2023-12-08 MED ORDER — NICOTINE 21 MG/24HR TD PT24: 21.0000 mg | MEDICATED_PATCH | Freq: Every day | TRANSDERMAL | Status: DC

## 2023-12-08 MED ORDER — ACETAMINOPHEN 325 MG PO TABS: 650.0000 mg | ORAL_TABLET | ORAL | Status: DC | PRN

## 2023-12-08 MED ORDER — ALUM & MAG HYDROXIDE-SIMETH 200-200-20 MG/5ML PO SUSP: 30.0000 mL | Freq: Four times a day (QID) | ORAL | Status: DC | PRN

## 2023-12-08 NOTE — ED Provider Triage Note (Signed)
 Emergency Medicine Provider Triage Evaluation Note  Zachary [name] , a 63 y.o. male  was evaluated in triage.  Pt complains of SI. States I don't want to be here anymore. Would vertically cut his wrists to kill himself and has done so once in past. Reports that he has been sober for several months but started drinking today as he ran out of klonopin  and was anxious. Had 10 beers today. Was broken up with by GF this morning   Review of Systems  Positive: See hpi Negative:   Physical Exam  BP 104/64   Pulse 63   Temp 98.1 F (36.7 C)   Resp 17   Ht 5' 9 (1.753 m)   Wt 68 kg   SpO2 98%   BMI 22.15 kg/m  Gen:   Awake, no distress   Resp:  Normal effort  MSK:   Moves extremities without difficulty  Other:  Nontremulous.   Medical Decision Making  Medically screening exam initiated at 8:39 PM.  Appropriate orders placed.  Zachary [name] was informed that the remainder of the evaluation will be completed by another provider, this initial triage assessment does not replace that evaluation, and the importance of remaining in the ED until their evaluation is complete.  Does not appear in withdrawal. No tremors. No complaints of tactile disturbances, nausea, HA. Ethanol [address]   Minnie Tinnie FORBES, [place] 12/08/23 2047

## 2023-12-08 NOTE — ED Notes (Signed)
 Pt is changing out in to blue hospital scrubs at this time.

## 2023-12-08 NOTE — ED Provider Notes (Addendum)
 [place] EMERGENCY DEPARTMENT AT [facility] Provider Note   CSN: [id number] Arrival date & time: 12/08/23  8171     Patient presents with: Suicidal   Zachary [name] is a 63 y.o. male.   The history is provided by the patient and medical records.   63 y.o. M with history of alcohol abuse (had been sober for 2 years before relapse today), anxiety, COPD, depression, substance-induced mood disorder, presenting to the ED for suicidal ideation.  Patient reports girlfriend abruptly broke up with him this morning for unclear reasons.  States he was having a hard time dealing with this and has also been on prednisone  for URI symptoms so he felt like that amped up his anxiety.  States he was given a short prescription for Klonopin  but they were not helping so he went out and bought beer, drink 8-10.  States he is mad at himself that he broke his sobriety.  States as the day went on he started having suicidal thoughts to cut his wrist which he has done in the past.  He did not attempt any self-harm.  He denies any other co-ingestion besides beer.  Denies any illicit drug use.  States he is still on doxycycline  as well for URI symptoms.  Had negative COVID and flu testing.  Prior to Admission medications   Medication Sig Start Date End Date Taking? Authorizing Provider  albuterol  (VENTOLIN  HFA) 108 (90 Base) MCG/ACT inhaler Inhale 2 puffs into the lungs every 4 (four) hours as needed for wheezing or shortness of breath. 12/03/23   Ula Prentice SAUNDERS, MD  doxycycline  (VIBRAMYCIN ) 100 MG capsule Take 1 capsule (100 mg total) by mouth 2 (two) times daily. 12/03/23   Ula Prentice SAUNDERS, MD  DULoxetine  (CYMBALTA ) 60 MG capsule Take 1 capsule (60 mg total) by mouth daily. 02/16/22   Nwoko, Uchenna E, PA  gabapentin  (NEURONTIN ) 400 MG capsule Take 1 capsule (400 mg total) by mouth 3 (three) times daily. 02/15/22   Nwoko, Uchenna E, PA  hydrOXYzine  (ATARAX ) 50 MG tablet Take 1 tablet (50 mg total) by mouth 3  (three) times daily as needed for anxiety. 02/15/22   Nwoko, Uchenna E, PA  lisinopril  (ZESTRIL ) 5 MG tablet Take 1 tablet (5 mg total) by mouth daily. 02/16/22   Nwoko, Uchenna E, PA  predniSONE  (DELTASONE ) 50 MG tablet Take 1 tablet by mouth daily 12/03/23   Ula Prentice SAUNDERS, MD  QUEtiapine  (SEROQUEL ) 300 MG tablet Take 1 tablet (300 mg total) by mouth at bedtime. 02/15/22   Nwoko, Uchenna E, PA  traZODone  (DESYREL ) 50 MG tablet Take 1 tablet (50 mg total) by mouth at bedtime as needed for sleep. 02/15/22   Nwoko, Uchenna E, PA    Allergies: Penicillins and Statins    Review of Systems  HENT:  Positive for congestion.   Psychiatric/Behavioral:  Positive for suicidal ideas.   All other systems reviewed and are negative.   Updated Vital Signs BP 104/64   Pulse 63   Temp 98.1 F (36.7 C)   Resp 17   Ht 5' 9 (1.753 m)   Wt 68 kg   SpO2 98%   BMI 22.15 kg/m   Physical Exam Vitals and nursing note reviewed.  Constitutional:      Appearance: He is well-developed.     Comments: Sleeping, awoken for exam  HENT:     Head: Normocephalic and atraumatic.     Nose: Congestion present.  Eyes:     Conjunctiva/sclera:  Conjunctivae normal.     Pupils: Pupils are equal, round, and reactive to light.  Cardiovascular:     Rate and Rhythm: Normal rate and regular rhythm.     Heart sounds: Normal heart sounds.  Pulmonary:     Effort: Pulmonary effort is normal.     Breath sounds: Wheezing present.     Comments: Dry cough, very faint expiratory wheezes but no acute distress, able to speak in full sentences without differential Abdominal:     General: Bowel sounds are normal.     Palpations: Abdomen is soft.  Musculoskeletal:        General: Normal range of motion.     Cervical back: Normal range of motion.  Skin:    General: Skin is warm and dry.  Neurological:     Mental Status: He is oriented to person, place, and time.  Psychiatric:     Comments: SI with plan to slit wrist with knife  (hx of same); denies HI/AVH     (all labs ordered are listed, but only abnormal results are displayed) Labs Reviewed  COMPREHENSIVE METABOLIC PANEL WITH GFR - Abnormal; Notable for the following components:      Result Value   Potassium 3.3 (*)    CO2 19 (*)    Calcium 8.7 (*)    Total Protein 5.8 (*)    All other components within normal limits  ETHANOL - Abnormal; Notable for the following components:   Alcohol, Ethyl (B) 84 (*)    All other components within normal limits  CBC WITH DIFFERENTIAL/PLATELET - Abnormal; Notable for the following components:   Monocytes Absolute 1.3 (*)    Abs Immature Granulocytes 0.25 (*)    All other components within normal limits  SALICYLATE LEVEL - Abnormal; Notable for the following components:   Salicylate Lvl <7.0 (*)    All other components within normal limits  RAPID URINE DRUG SCREEN, HOSP PERFORMED    EKG: None  Radiology: No results found.   Procedures   Medications Ordered in the ED - No data to display                                  Medical Decision Making Amount and/or Complexity of Data Reviewed Labs: ordered. ECG/medicine tests: ordered and independent interpretation performed.  Risk OTC drugs. Prescription drug management.   63 year old male presenting to the ED with suicidal ideation.  Girlfriend broke up with him this morning.  Some increased anxiety as well, not responding to his home klonopin .  Broke his 2 year sobriety, drank about 8-10 beers today.  Reports SI with thoughts of slitting his wrist.  Has hx of this in the past.  No attempted self harm.  Sleeping on assessment, awoken for exam.  NAD noted. Does sound congested--recent ED visit for same with negative COVID testing as well as chest x-ray.  Currently on doxycycline  for same.  Labs today without leukocytosis or electrolyte derangement.  Ethanol 84.  Medically cleared.  Will obtain TTS consult.    1:27 AM Dr. Drury with [facility] telehealth has  evaluated patient, recommends admission for IP treatment.  He has made some additional medical adjustments as well. Placement pending.  Patient remains voluntary.  5:59 AM Accepted to [facility] under care of Dr. Prentis pending discharges this AM. Will update with room assignment once ready.  Final diagnoses:  Suicidal ideation    ED Discharge Orders  None          Jarold Olam HERO, PA-C 12/09/23 0259    Jarold Olam HERO, PA-C 12/09/23 9398    Cottie Donnice PARAS, MD 12/09/23 [phone]

## 2023-12-08 NOTE — ED Triage Notes (Signed)
 SI with  no plan brought from [facility].  Patient stated if he stayed at home he would find a way.  Hx of psych +ETOH Has attempted before with left arm lac 2years , Klonopin  has run out a couple days ago and has been arguing with girlfriend. Alcohol started today.

## 2023-12-09 ENCOUNTER — Other Ambulatory Visit: Payer: Self-pay

## 2023-12-09 ENCOUNTER — Encounter (HOSPITAL_COMMUNITY): Payer: Self-pay

## 2023-12-09 ENCOUNTER — Inpatient Hospital Stay (HOSPITAL_COMMUNITY)
Admission: AD | Admit: 2023-12-09 | Discharge: 2023-12-13 | DRG: 880 | Disposition: A | Discharge status: Home / Self Care | Source: Intra-hospital

## 2023-12-09 DIAGNOSIS — Z823 Family history of stroke: Secondary | ICD-10-CM

## 2023-12-09 DIAGNOSIS — Z880 Allergy status to penicillin: Secondary | ICD-10-CM | POA: Diagnosis not present

## 2023-12-09 DIAGNOSIS — Z8249 Family history of ischemic heart disease and other diseases of the circulatory system: Secondary | ICD-10-CM

## 2023-12-09 DIAGNOSIS — R45851 Suicidal ideations: Secondary | ICD-10-CM | POA: Diagnosis present

## 2023-12-09 DIAGNOSIS — T424X6A Underdosing of benzodiazepines, initial encounter: Secondary | ICD-10-CM | POA: Diagnosis present

## 2023-12-09 DIAGNOSIS — I10 Essential (primary) hypertension: Secondary | ICD-10-CM | POA: Diagnosis present

## 2023-12-09 DIAGNOSIS — Z6372 Alcoholism and drug addiction in family: Secondary | ICD-10-CM

## 2023-12-09 DIAGNOSIS — F17290 Nicotine dependence, other tobacco product, uncomplicated: Secondary | ICD-10-CM | POA: Diagnosis present

## 2023-12-09 DIAGNOSIS — F410 Panic disorder [episodic paroxysmal anxiety] without agoraphobia: Secondary | ICD-10-CM | POA: Diagnosis present

## 2023-12-09 DIAGNOSIS — J189 Pneumonia, unspecified organism: Secondary | ICD-10-CM | POA: Diagnosis present

## 2023-12-09 DIAGNOSIS — F329 Major depressive disorder, single episode, unspecified: Secondary | ICD-10-CM | POA: Diagnosis present

## 2023-12-09 DIAGNOSIS — E876 Hypokalemia: Secondary | ICD-10-CM | POA: Diagnosis present

## 2023-12-09 DIAGNOSIS — F430 Acute stress reaction: Secondary | ICD-10-CM | POA: Diagnosis present

## 2023-12-09 DIAGNOSIS — F4310 Post-traumatic stress disorder, unspecified: Secondary | ICD-10-CM | POA: Diagnosis present

## 2023-12-09 DIAGNOSIS — Z604 Social exclusion and rejection: Secondary | ICD-10-CM | POA: Diagnosis present

## 2023-12-09 DIAGNOSIS — Z818 Family history of other mental and behavioral disorders: Secondary | ICD-10-CM | POA: Diagnosis not present

## 2023-12-09 DIAGNOSIS — Y904 Blood alcohol level of 80-99 mg/100 ml: Secondary | ICD-10-CM | POA: Diagnosis present

## 2023-12-09 DIAGNOSIS — F1010 Alcohol abuse, uncomplicated: Secondary | ICD-10-CM | POA: Diagnosis present

## 2023-12-09 DIAGNOSIS — J440 Chronic obstructive pulmonary disease with acute lower respiratory infection: Secondary | ICD-10-CM | POA: Diagnosis present

## 2023-12-09 DIAGNOSIS — Z91128 Patient's intentional underdosing of medication regimen for other reason: Secondary | ICD-10-CM

## 2023-12-09 DIAGNOSIS — Z87828 Personal history of other (healed) physical injury and trauma: Secondary | ICD-10-CM

## 2023-12-09 DIAGNOSIS — Z811 Family history of alcohol abuse and dependence: Secondary | ICD-10-CM | POA: Diagnosis not present

## 2023-12-09 DIAGNOSIS — Z23 Encounter for immunization: Secondary | ICD-10-CM | POA: Diagnosis present

## 2023-12-09 DIAGNOSIS — Z79899 Other long term (current) drug therapy: Secondary | ICD-10-CM

## 2023-12-09 DIAGNOSIS — F1390 Sedative, hypnotic, or anxiolytic use, unspecified, uncomplicated: Secondary | ICD-10-CM | POA: Diagnosis not present

## 2023-12-09 DIAGNOSIS — Z91148 Patient's other noncompliance with medication regimen for other reason: Secondary | ICD-10-CM

## 2023-12-09 DIAGNOSIS — Z888 Allergy status to other drugs, medicaments and biological substances status: Secondary | ICD-10-CM

## 2023-12-09 DIAGNOSIS — Z9151 Personal history of suicidal behavior: Secondary | ICD-10-CM

## 2023-12-09 DIAGNOSIS — Z630 Problems in relationship with spouse or partner: Secondary | ICD-10-CM

## 2023-12-09 DIAGNOSIS — F411 Generalized anxiety disorder: Principal | ICD-10-CM | POA: Diagnosis present

## 2023-12-09 DIAGNOSIS — F1320 Sedative, hypnotic or anxiolytic dependence, uncomplicated: Secondary | ICD-10-CM

## 2023-12-09 DIAGNOSIS — F1090 Alcohol use, unspecified, uncomplicated: Secondary | ICD-10-CM | POA: Diagnosis not present

## 2023-12-09 DIAGNOSIS — Z562 Threat of job loss: Secondary | ICD-10-CM

## 2023-12-09 LAB — RAPID URINE DRUG SCREEN, HOSP PERFORMED
Amphetamines: NOT DETECTED
Barbiturates: NOT DETECTED
Benzodiazepines: NOT DETECTED
Cocaine: NOT DETECTED
Opiates: NOT DETECTED
Tetrahydrocannabinol: POSITIVE — AB

## 2023-12-09 MED ORDER — HYDROXYZINE HCL 25 MG PO TABS
25.0000 mg | ORAL_TABLET | Freq: Three times a day (TID) | ORAL | Status: DC | PRN
  Administered 2023-12-09 – 2023-12-12 (×4): 25 mg via ORAL
  Filled 2023-12-09 (×4): qty 1

## 2023-12-09 MED ORDER — ALBUTEROL SULFATE HFA 108 (90 BASE) MCG/ACT IN AERS: 2.0000 | INHALATION_SPRAY | RESPIRATORY_TRACT | Status: DC | PRN

## 2023-12-09 MED ORDER — CLONAZEPAM 0.5 MG PO TABS
1.0000 mg | ORAL_TABLET | Freq: Three times a day (TID) | ORAL | Status: DC
  Administered 2023-12-09 – 2023-12-12 (×10): 1 mg via ORAL
  Filled 2023-12-09 (×10): qty 2

## 2023-12-09 MED ORDER — INFLUENZA VAC SPLIT HIGH-DOSE 0.5 ML IM SUSY
0.5000 mL | PREFILLED_SYRINGE | INTRAMUSCULAR | Status: DC
Start: 2023-12-10 — End: 2023-12-11
  Filled 2023-12-09: qty 0.5

## 2023-12-09 MED ORDER — DOXYCYCLINE HYCLATE 100 MG PO TABS
100.0000 mg | ORAL_TABLET | Freq: Two times a day (BID) | ORAL | Status: AC
  Administered 2023-12-09 – 2023-12-13 (×9): 100 mg via ORAL
  Filled 2023-12-09 (×9): qty 1

## 2023-12-09 MED ORDER — ALUM & MAG HYDROXIDE-SIMETH 200-200-20 MG/5ML PO SUSP: 30.0000 mL | ORAL | Status: DC | PRN

## 2023-12-09 MED ORDER — OLANZAPINE 10 MG IM SOLR: 5.0000 mg | Freq: Three times a day (TID) | INTRAMUSCULAR | Status: DC | PRN

## 2023-12-09 MED ORDER — HYDROXYZINE HCL 25 MG PO TABS: 50.0000 mg | ORAL_TABLET | Freq: Four times a day (QID) | ORAL | Status: DC | PRN

## 2023-12-09 MED ORDER — CLONAZEPAM 0.5 MG PO TABS
1.0000 mg | ORAL_TABLET | Freq: Three times a day (TID) | ORAL | Status: DC
Start: 2023-12-09 — End: 2023-12-09
  Administered 2023-12-09: 1 mg via ORAL
  Filled 2023-12-09: qty 2

## 2023-12-09 MED ORDER — CLONAZEPAM 0.5 MG PO TABS
1.0000 mg | ORAL_TABLET | Freq: Three times a day (TID) | ORAL | Status: DC
Start: 2023-12-09 — End: 2023-12-09

## 2023-12-09 MED ORDER — DOXYCYCLINE HYCLATE 100 MG PO TABS
100.0000 mg | ORAL_TABLET | Freq: Two times a day (BID) | ORAL | Status: DC
  Administered 2023-12-09: 100 mg via ORAL
  Filled 2023-12-09: qty 1

## 2023-12-09 MED ORDER — MAGNESIUM HYDROXIDE 400 MG/5ML PO SUSP: 30.0000 mL | Freq: Every day | ORAL | Status: DC | PRN

## 2023-12-09 MED ORDER — OLANZAPINE 5 MG PO TBDP
5.0000 mg | ORAL_TABLET | Freq: Three times a day (TID) | ORAL | Status: DC | PRN
  Administered 2023-12-09: 5 mg via ORAL
  Filled 2023-12-09: qty 1

## 2023-12-09 MED ORDER — POTASSIUM CHLORIDE CRYS ER 20 MEQ PO TBCR
20.0000 meq | EXTENDED_RELEASE_TABLET | Freq: Two times a day (BID) | ORAL | Status: AC
  Administered 2023-12-09 – 2023-12-10 (×2): 20 meq via ORAL
  Filled 2023-12-09 (×2): qty 1

## 2023-12-09 MED ORDER — HYDROXYZINE HCL 50 MG PO TABS: 50.0000 mg | ORAL_TABLET | Freq: Four times a day (QID) | ORAL | Status: DC | PRN

## 2023-12-09 MED ORDER — MIRTAZAPINE 7.5 MG PO TABS
7.5000 mg | ORAL_TABLET | Freq: Every day | ORAL | Status: DC
  Administered 2023-12-09 – 2023-12-12 (×4): 7.5 mg via ORAL
  Filled 2023-12-09 (×4): qty 1

## 2023-12-09 MED ORDER — ACETAMINOPHEN 325 MG PO TABS
650.0000 mg | ORAL_TABLET | Freq: Four times a day (QID) | ORAL | Status: DC | PRN
  Administered 2023-12-11 – 2023-12-12 (×2): 650 mg via ORAL
  Filled 2023-12-09 (×2): qty 2

## 2023-12-09 MED ORDER — QUETIAPINE FUMARATE 100 MG PO TABS
100.0000 mg | ORAL_TABLET | Freq: Every evening | ORAL | Status: DC | PRN
  Administered 2023-12-09: 100 mg via ORAL
  Filled 2023-12-09: qty 1

## 2023-12-09 MED ORDER — QUETIAPINE FUMARATE 50 MG PO TABS: 50.0000 mg | ORAL_TABLET | Freq: Every evening | ORAL | Status: DC | PRN

## 2023-12-09 NOTE — Group Note (Signed)
 Date:  12/09/2023 Time:  11:01 AM  Group Topic/Focus:  Goals Group:   The focus of this group is to help patients establish daily goals to achieve during treatment and discuss how the patient can incorporate goal setting into their daily lives to aide in recovery.    Participation Level:  Did Not Attend  Participation Quality:  N/A  Affect:  N/A  Cognitive:  N/A  Insight: None  Engagement in Group:  N/A  Modes of Intervention:  N/A  Additional Comments:  Patient was not admitted to [facility] at the time of this note being created or when goals group was held.  Kristi HERO Esau Fridman 12/09/2023, 11:01 AM

## 2023-12-09 NOTE — ED Notes (Signed)
 TTS consult started. Patient in room 5

## 2023-12-09 NOTE — H&P (Signed)
 Psychiatric Admission Assessment Adult  Patient Identification: Zachary [name] MRN:  [id number] Date of Evaluation:  12/09/2023  Chief Complaint:  GAD (generalized anxiety disorder) [F41.1],  GAD (generalized anxiety disorder)  Principal Problem:   GAD (generalized anxiety disorder) Active Problems:   Chronic prescription benzodiazepine use   History of Present Illness:  TOA MIA is a 63 y.o., male with a past psychiatric history significant for MDD, GAD and alcohol use disorder who presents to the [facility] Voluntary from [facility] for evaluation and management of SI with plan to slit his wrist.   Initial assessment on 9/27, patient was evaluated on the inpatient unit, the patient reports having a chest cold three days ago and thus he started getting behind at work and having conflict with his girlfriend and they went through a break up. He states his mind was racing and he had thoughts of wanting to be alive anymore. He denied having a plan to kill himself and he stated that he admitted himself to the hospital because he was afraid of his SI escalating. He feels like the prednisone  that was prescirbed for his lung infection increased his anxiety and could not control his anxiety. He states that he has not slept all week and this was due to running out of Klonopin . He reports that with the increased stressors, he started taking his Klonopin  more often which caused him to run out about one week early. He states the lack of Klonopin , getting put on prednisone  for the lung infection, and the psychosocial stressors have caused him to have SI. He notes having less pleasure in his life and attributes this to working an increased amount. He notes feeling hopeless for the future. He notes having poor energy and focus. He is currently denying SI and contracts for safety. He plans on following up with his psychiatrist to continue his Klonopin . Per PDMP, he is  filling the medication appropriately.    Chart review: per psychiatry consult note on 9/27, The patient presents with long history of depression and anxiety, treated by Klonopin .  History of EtOH abuse, but sober x 2 years because Klonopin  used for anxiety.  Patient admitted for suicidal ideation. Patient has been relatively stable in his anxiety and panic sx's by using the Klonopin  1 mg tid, along with lower-dose Seroquel  at night for mood-related anxiety.  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  Then over past few months, increasing tensions between him and his girlfriend of three years.  Developed upper respiratory issues, and was put on prednisone  around the time that he also ran out of Klonopin .   Marked increase in anxiety and agitation, and again he felt rejected by both his son and girlfriend, so began having thoughts of slicing his wrist, which he has done one time before.  History of two prior hospitalizations for depression.  Psychiatric medications prior to admission: Klonopin  1 mg TID, Seroquel   Past Psychiatric History:  Current medications: Klonopin  since 2020 (mainly filled by Dr. Tillman, most recent fill was 9/25 one day supply and previously was filling monthly appropriately), Seroquel  Current psychiatrist: Telemed through [facility]- once every 4 months Current therapist: Telemed through [facility]  Previous psychiatric diagnoses: MDD, GAD Prior medications: Cymbalta  (feels it was ineffective), Paxil (ineffective), Zoloft  (tried it), Lexapro  (does not recall working at all), Prozac   Psychiatric medication compliance history: poor  Previous hospitalizations most recently admitted to [facility] in 2023 due to increased  depression and passive SI after OD on Klonopin ; Has been a patient at the [facility], [facility], [facility] care, [facility].  He has been treated with multiple  medications in the past, but was never compliant.   History of suicide attempts: yes, most recently 2.5 years ago (slight his wrist) History of self harm: other than the suicide attempt, denies  Substance Use History (onset/amount/frequency/most recent use date): Alcohol: drank once after 2 years of sobriety- 8-10 beers and  Hx withdrawal tremors/shakes: denies Hx alcohol related blackouts denies Hx alcohol induced hallucinations: denies Hx alcoholic seizures: denies DUI: denies  Tobacco: 2 cigars daily Marijuana: weekly, CBD pipe Cocaine: denies Methamphetamines: denies Opiates: denies Benzodiazepines: using prescribed Klonopin  Ecstasy: denies  History of Detox / Rehab: yes, plenty  Substance Abuse History in the last 12 months: Yes  Past Medical/Surgical History:  Medical Diagnoses: COPD Home Rx: Doxycycline  for lung infection Prior Hospitalization: none recently Prior Surgeries: denies  Head trauma: yes multiple Seizures: denies  Allergies: statin (palpitations) and penicillin (unsure of reaction)  Family History:  Alcoholism: Mother, brother & father.   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 Yes Has the patient been a risk to self in the past 6 months? Yes Has the patient been a risk to self within the distant past? Yes Is the patient a risk to others? No Has the patient been a risk to others in the past 6 months? No Has the patient been a risk to others within the distant past? No  Alcohol Screening:   Tobacco Screening:    Lab Results:  Results for orders placed or performed during the hospital encounter of 12/08/23 (from the past 48 hours)  Rapid urine drug screen (hospital performed)     Status: Abnormal   Collection Time: 12/08/23 12:41 AM  Result Value Ref Range   Opiates NONE DETECTED NONE  DETECTED   Cocaine NONE DETECTED NONE DETECTED   Benzodiazepines NONE DETECTED NONE DETECTED   Amphetamines NONE DETECTED NONE DETECTED   Tetrahydrocannabinol POSITIVE (A) NONE DETECTED   Barbiturates NONE DETECTED NONE DETECTED    Comment: (NOTE) DRUG SCREEN FOR MEDICAL PURPOSES ONLY.  IF CONFIRMATION IS NEEDED FOR ANY PURPOSE, NOTIFY LAB WITHIN 5 DAYS.  LOWEST DETECTABLE LIMITS FOR URINE DRUG SCREEN Drug Class                     Cutoff (ng/mL) Amphetamine and metabolites    1000 Barbiturate and metabolites    200 Benzodiazepine                 200 Opiates and metabolites        300 Cocaine and metabolites        300 THC                            50 Performed at [facility] Lab, [address] [place], [place] [address]   Comprehensive metabolic panel     Status: Abnormal   Collection Time: 12/08/23  6:53 PM  Result Value Ref Range   Sodium 136 135 - 145 mmol/L   Potassium 3.3 (L) 3.5 - 5.1 mmol/L   Chloride 103 98 - 111 mmol/L   CO2 19 (L) 22 - 32 mmol/L   Glucose,  Bld 90 70 - 99 mg/dL    Comment: Glucose reference range applies only to samples taken after fasting for at least 8 hours.   BUN 14 8 - 23 mg/dL   Creatinine, Ser 9.27 0.61 - 1.24 mg/dL   Calcium 8.7 (L) 8.9 - 10.3 mg/dL   Total Protein 5.8 (L) 6.5 - 8.1 g/dL   Albumin 3.6 3.5 - 5.0 g/dL   AST 16 15 - 41 U/L   ALT 19 0 - 44 U/L   Alkaline Phosphatase 62 38 - 126 U/L   Total Bilirubin 0.6 0.0 - 1.2 mg/dL   GFR, Estimated >39 >39 mL/min    Comment: (NOTE) Calculated using the CKD-EPI Creatinine Equation (2021)    Anion gap 14 5 - 15    Comment: Performed at [facility] Lab, [address] [place], [place] [address]  Ethanol     Status: Abnormal   Collection Time: 12/08/23  6:53 PM  Result Value Ref Range   Alcohol, Ethyl (B) 84 (H) <15 mg/dL    Comment: (NOTE) For medical purposes only. Performed at [facility], [address] [place], [place] [address]   CBC with Differential      Status: Abnormal   Collection Time: 12/08/23  6:53 PM  Result Value Ref Range   WBC 10.4 4.0 - 10.5 K/uL   RBC 4.42 4.22 - 5.81 MIL/uL   Hemoglobin 14.6 13.0 - 17.0 g/dL   HCT 57.7 60.9 - 47.9 %   MCV 95.5 80.0 - 100.0 fL   MCH 33.0 26.0 - 34.0 pg   MCHC 34.6 30.0 - 36.0 g/dL   RDW 87.2 88.4 - 84.4 %   Platelets 175 150 - 400 K/uL   nRBC 0.0 0.0 - 0.2 %   Neutrophils Relative % 53 %   Neutro Abs 5.5 1.7 - 7.7 K/uL   Lymphocytes Relative 29 %   Lymphs Abs 3.0 0.7 - 4.0 K/uL   Monocytes Relative 12 %   Monocytes Absolute 1.3 (H) 0.1 - 1.0 K/uL   Eosinophils Relative 3 %   Eosinophils Absolute 0.3 0.0 - 0.5 K/uL   Basophils Relative 1 %   Basophils Absolute 0.1 0.0 - 0.1 K/uL   Immature Granulocytes 2 %   Abs Immature Granulocytes 0.25 (H) 0.00 - 0.07 K/uL    Comment: Performed at [facility] Lab, [address] [place], [place] [address]  Salicylate level     Status: Abnormal   Collection Time: 12/08/23  6:53 PM  Result Value Ref Range   Salicylate Lvl <7.0 (L) 7.0 - 30.0 mg/dL    Comment: Performed at [facility] Lab, [address] [place], [place] [address]    Blood Alcohol level:  Lab Results  Component Value Date   ETH 84 (H) 12/08/2023   ETH <10 02/09/2022    Metabolic Disorder Labs:  Lab Results  Component Value Date   HGBA1C 5.1 02/11/2022   MPG 100 02/11/2022   MPG 93.93 07/22/2021   No results found for: PROLACTIN Lab Results  Component Value Date   CHOL 235 (H) 02/11/2022   TRIG 89 02/11/2022   HDL 54 02/11/2022   CHOLHDL 4.4 02/11/2022   VLDL 18 02/11/2022   LDLCALC 163 (H) 02/11/2022   LDLCALC 94 07/22/2021    Current Medications: Current Facility-Administered Medications  Medication Dose Route Frequency Provider Last Rate Last Admin   acetaminophen  (TYLENOL ) tablet 650 mg  650 mg Oral Q6H PRN Randall Bouquet  M, NP       albuterol  (VENTOLIN  HFA) 108 (90 Base) MCG/ACT inhaler 2 puff  2 puff Inhalation Q4H PRN Byungura, Veronique  M, NP       alum & mag hydroxide-simeth (MAALOX/MYLANTA) 200-200-20 MG/5ML suspension 30 mL  30 mL Oral Q4H PRN Randall Starlyn HERO, NP       clonazePAM  (KLONOPIN ) tablet 1 mg  1 mg Oral TID Byungura, Veronique M, NP   1 mg at 12/09/23 1039   doxycycline  (VIBRA -TABS) tablet 100 mg  100 mg Oral BID Randall Starlyn HERO, NP   100 mg at 12/09/23 1039   hydrOXYzine  (ATARAX ) tablet 25 mg  25 mg Oral Q8H PRN Aerionna Moravek B, MD       [START ON 12/10/2023] Influenza vac split trivalent PF (FLUZONE HIGH-DOSE) injection 0.5 mL  0.5 mL Intramuscular Tomorrow-1000 Bouchard, Marc A, DO       magnesium  hydroxide (MILK OF MAGNESIA) suspension 30 mL  30 mL Oral Daily PRN Randall, Veronique M, NP       OLANZapine (ZYPREXA) injection 5 mg  5 mg Intramuscular TID PRN Randall, Veronique M, NP       OLANZapine zydis (ZYPREXA) disintegrating tablet 5 mg  5 mg Oral TID PRN Byungura, Veronique M, NP       QUEtiapine  (SEROQUEL ) tablet 50 mg  50 mg Oral QHS PRN Margo Lama B, MD        PTA Medications: Medications Prior to Admission  Medication Sig Dispense Refill Last Dose/Taking   albuterol  (VENTOLIN  HFA) 108 (90 Base) MCG/ACT inhaler Inhale 2 puffs into the lungs every 4 (four) hours as needed for wheezing or shortness of breath. 1 each 0    doxycycline  (VIBRAMYCIN ) 100 MG capsule Take 1 capsule (100 mg total) by mouth 2 (two) times daily. 20 capsule 0    KLONOPIN  1 MG tablet Take 1 mg by mouth 3 (three) times daily.      predniSONE  (DELTASONE ) 50 MG tablet Take 1 tablet by mouth daily 4 tablet 0    QUEtiapine  (SEROQUEL ) 100 MG tablet Take 100 mg by mouth at bedtime as needed.        Physical Findings: AIMS: No  CIWA:    COWS:     Psychiatric Status Exam  Presentation  General Appearance: Appropriate for Environment   Eye Contact:Fair   Speech:Clear and Coherent   Speech Volume:Normal   Handedness:Right   Mood and Affect  Mood:Depressed; Hopeless; Irritable   Affect:Depressed;  Congruent    Thought Process  Thought Processes:Coherent; Linear   Duration of Psychotic Symptoms: NA Past Diagnosis of Schizophrenia or Psychoactive disorder: No Descriptions of Associations:Intact   Orientation:Full (Time, Place and Person)   Thought Content:Logical   Hallucinations:Hallucinations: None   Ideas of Reference:None   Suicidal Thoughts:Suicidal Thoughts: No   Homicidal Thoughts:Homicidal Thoughts: No    Sensorium  Memory:Remote Good   Judgment:Impaired   Insight:Fair    Executive Functions  Concentration:Fair   Attention Span:Fair   Recall:Fair   Fund of Knowledge:Fair   Language:Fair    Psychomotor Activity  Psychomotor Activity:Psychomotor Activity: Normal    Assets  Assets:Resilience; Desire for Improvement   Physical Exam Vitals reviewed.  Constitutional:      Appearance: Normal appearance.  HENT:     Head: Normocephalic and atraumatic.  Cardiovascular:     Rate and Rhythm: Normal rate.  Pulmonary:     Effort: Pulmonary effort is normal.  Neurological:     General: No focal deficit present.  Mental Status: He is alert.    Review of Systems  Constitutional:  Negative for chills and fever.  Respiratory:  Negative for shortness of breath.   Cardiovascular:  Negative for chest pain and palpitations.  Gastrointestinal:  Negative for nausea and vomiting.  Neurological:  Negative for headaches.    Blood pressure 130/80, pulse 71, temperature 98.5 F (36.9 C), temperature source Oral, resp. rate 18, height 5' 9 (1.753 m), weight 66.8 kg, SpO2 96%. Body mass index is 21.74 kg/m.   Treatment Plan Summary: Daily contact with patient to assess and evaluate symptoms and progress in treatment and medication management  ASSESSMENT: Theophil Thivierge is a 63 year old male with a past psychiatric history of GAD with panic attacks with chronic benzodiazepine use of Klonopin  who presents to [facility] due to increased  depression and anxiety and active SI without a plan.   Patient reports increased psychosocial stressors involving work and relationships in which he started increasing his Klonopin  use and in turn, ran out of his Klonopin  one week early before his next refill. He reports his anxiety symptoms are much improved since he has been placed on Klonopin  in 2020. Discussed the long term risks of being on benzodiazepines in which he states understanding but is adamant that Klonopin  is helping keep his anxiety symptoms controlled. PDMP shows that patient has been appropriately filling his medications since 2023. Thinking about the risks and benefits of Klonopin  for this patient, I will continue his Klonopin  medication as he has not been filling his medications appropriately, psychiatric symptom stability, and he has an OP provider who is managing the medication whom patient plans to follow up with. Stopping the medication could risk further psychiatric decompensation as well. Patient is agreeable to starting Remeron to aid with sleep and appetite. I discussed the risks/benefits/possible adverse effects of starting Remeron. Patient would also benefit from more frequent therapy and psychiatry appointments in the outpatient setting.  PLAN: Safety and Monitoring:  -- Voluntary admission to inpatient psychiatric unit for safety, stabilization and treatment  -- Daily contact with patient to assess and evaluate symptoms and progress in treatment  -- Patient's case to be discussed in multi-disciplinary team meeting  -- Observation Level : q15 minute checks  -- Vital signs:  q12 hours  -- Precautions: suicide, elopement, and assault  2. Medications:    Psychiatric Diagnosis and Treatment Chronic benzodiazepine use GAD with panic attacks - Continue home Klonopin  1 mg TID  - PDMP checked, getting Klonopin  refilled since 2023 - Start Remeron 7.5 mg nightly  - Atarax  25 mg TID as needed for anxiety - Agitation  Protocol: Zyprexa  Medical Diagnosis and Treatment COPD exacerbation- continue home doxycycline  for 9 more doses Hypokalemia 3.3 - 20 meq x2 ordered Patient does not need nicotine  replacement  Other as needed medications  Tylenol  650 mg every 6 hours as needed for pain Mylanta 30 mL every 4 hours as needed for indigestion Milk of magnesia 30 mL daily as needed for constipation   The risks/benefits/side-effects/alternatives to the above medication were discussed in detail with the patient and time was given for questions. The patient consents to medication trial. FDA black box warnings, if present, were discussed.  The patient is agreeable with the medication plan, as above. We will monitor the patient's response to pharmacologic treatment, and adjust medications as necessary.  3. Routine and other pertinent labs: EKG monitoring: QTc: ordered  Metabolism / endocrine: BMI: Body mass index is 21.74 kg/m.   CBC:  unremarkable CMP: K 3.3 UDS: positive THC Ethanol: 84  4. Group Therapy:  -- Encouraged patient to participate in unit milieu and in scheduled group therapies   -- Short Term Goals: Ability to identify changes in lifestyle to reduce recurrence of condition will improve, Ability to verbalize feelings will improve, Ability to disclose and discuss suicidal ideas, Ability to demonstrate self-control will improve, Ability to identify and develop effective coping behaviors will improve, Ability to maintain clinical measurements within normal limits will improve, Compliance with prescribed medications will improve, and Ability to identify triggers associated with substance abuse/mental health issues will improve  -- Long Term Goals: Improvement in symptoms so as ready for discharge -- Patient is encouraged to participate in group therapy while admitted to the psychiatric unit. -- We will address other chronic and acute stressors, which contributed to the patient's GAD (generalized  anxiety disorder) in order to reduce the risk of self-harm at discharge.  5. Discharge Planning:   -- Social work and case management to assist with discharge planning and identification of hospital follow-up needs prior to discharge  -- Estimated LOS: 5-7 days  -- Discharge Concerns: Need to establish a safety plan; Medication compliance and effectiveness  -- Discharge Goals: Return home with outpatient referrals for mental health follow-up including medication management/psychotherapy  I certify that inpatient services furnished can reasonably be expected to improve the patient's condition.     Ismael KATHEE Franco, MD 9/27/20253:01 PM

## 2023-12-09 NOTE — Plan of Care (Signed)
   Problem: Education: Goal: Knowledge of [facility] Education information/materials will improve Outcome: Progressing Goal: Verbalization of understanding the information provided will improve Outcome: Progressing

## 2023-12-09 NOTE — Consult Note (Signed)
 [facility] Consult Note  Patient Name: Zachary [name] MRN: [id number] DOB: 01-05-1961 DATE OF Consult: 12/09/2023  PRIMARY PSYCHIATRIC DIAGNOSES   1.  Generalized Anxiety Disorder 2.  Acute Stress Reaction 3.  Alcohol Use Disorder   RECOMMENDATIONS  Recommendations: Medication recommendations: Klonopin , 1 mg tid for anxiety/panic disorder; Seroquel , 100 mg at bedtime for mood control; hydroxyzine  50 mg q6h PRN anxiety Non-Medication/therapeutic recommendations: Patient still with suicidal ideation with plan, so will continue to require close observation until he can be safely admitted to Psychiatry.  Continue with matter-of-fact emotional support in ED, pending transfer Is inpatient psychiatric hospitalization recommended for this patient? Yes (Explain why): Patient is depressed and anxious, and he continues with thoughts of wanting to slit his write and just give up. Meets criteria for inpatient care, and he is willing to sign in voluntarily. Is another care setting recommended for this patient? (examples may include Crisis Stabilization Unit, Residential/Recovery Treatment, ALF/SNF, Memory Care Unit)  No (Explain why): As above From a psychiatric perspective, is this patient appropriate for discharge to an outpatient setting/resource or other less restrictive environment for continued care?  No (Explain why): As above Follow-Up Telepsychiatry C/L services: We will sign off for now. Please re-consult our service if needed for any concerning changes in the patient's condition, discharge planning, or questions. Communication: Treatment team members (and family members if applicable) who were involved in treatment/care discussions and planning, and with whom we spoke or engaged with via secure text/chat, include the following: Secure message for Dr. Cottie and Ms. Jarold, ED providers, and staff, outlining recommendations.  Thank you for involving us  in the care of this patient. If you  have any additional questions or concerns, please call [phone] and ask for the provider on-call.   TELEPSYCHIATRY ATTESTATION & CONSENT   As the provider for this telehealth consult, I attest that I verified the patient's identity using two separate identifiers, introduced myself to the patient, provided my credentials, disclosed my location, and performed this encounter via a HIPAA-compliant, real-time, face-to-face, two-way, interactive audio and video platform and with the full consent and agreement of the patient (or guardian as applicable.)   Patient physical location: [facility]. Telehealth provider physical location: home office in state of [place] .  Video start time: 0100h EDT  Video end time: 0115h EDT   Total time spent in this encounter was 30 minutes, including record review, clinical interview, behavior observations, discussion of impressions and recommendations (including medications and hospitalization), and consultation/communication with relevant parties   IDENTIFYING DATA  Zachary [name] is a 63 y.o. year-old male for whom a psychiatric consultation has been ordered by the primary provider. The patient was identified using two separate identifiers.  CHIEF COMPLAINT/REASON FOR CONSULT   I just give up.  I can't take this anymore.   HISTORY OF PRESENT ILLNESS (HPI)  The patient presents with long history of depression and anxiety, treated by Klonopin .  History of EtOH abuse, but sober x 2 years because Klonopin  used for anxiety.  Patient admitted for suicidal ideation.  Patient has been relatively stable in his anxiety and panic sx's by using the Klonopin  1 mg tid, along with lower-dose Seroquel  at night for mood-related anxiety.  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  Then over past few months, increasing tensions between him and his girlfriend of three years.  Developed upper respiratory issues, and was  put on prednisone  around the time that he also  ran out of Klonopin .   Marked increase in anxiety and agitation, and again he felt rejected by both his son and girlfriend, so began having thoughts of slicing his wrist, which he has done one time before.  History of two prior hospitalizations for depression.  Began drinking again with all these sx's.  BAL was  84 on admit.  Does use cannabis at least weekly, and UDS positive for it.  No homicidal ideation.  No psychotic sx's.   PAST PSYCHIATRIC HISTORY   Otherwise as per HPI above.  PAST MEDICAL HISTORY  Past Medical History:  Diagnosis Date   Alcohol abuse    Anxiety    COPD (chronic obstructive pulmonary disease) (HCC)    Depression    Hypertension    Suicidal ideation      HOME MEDICATIONS  Facility Ordered Medications  Medication   acetaminophen  (TYLENOL ) tablet 650 mg   nicotine  (NICODERM CQ  - dosed in mg/24 hours) patch 21 mg   alum & mag hydroxide-simeth (MAALOX/MYLANTA) 200-200-20 MG/5ML suspension 30 mL   albuterol  (VENTOLIN  HFA) 108 (90 Base) MCG/ACT inhaler 2 puff   doxycycline  (VIBRA -TABS) tablet 100 mg   QUEtiapine  (SEROQUEL ) tablet 100 mg   clonazePAM  (KLONOPIN ) tablet 1 mg   hydrOXYzine  (ATARAX ) tablet 50 mg   PTA Medications  Medication Sig   predniSONE  (DELTASONE ) 50 MG tablet Take 1 tablet by mouth daily   albuterol  (VENTOLIN  HFA) 108 (90 Base) MCG/ACT inhaler Inhale 2 puffs into the lungs every 4 (four) hours as needed for wheezing or shortness of breath.   doxycycline  (VIBRAMYCIN ) 100 MG capsule Take 1 capsule (100 mg total) by mouth 2 (two) times daily.   Klonopin , Seroquel , as above  ALLERGIES  Allergies  Allergen Reactions   Penicillins Other (See Comments)    Childhood allergy Reaction:  Unknown  Has patient had a PCN reaction causing immediate rash, facial/tongue/throat swelling, SOB or lightheadedness with hypotension: Unknown Has patient had a PCN reaction causing severe rash involving mucus  membranes or skin necrosis: Unknown Has patient had a PCN reaction that required hospitalization Unknown Has patient had a PCN reaction occurring within the last 10 years: No If all of the above answers are NO, then may proceed with Cephalosporin use.   Statins Palpitations    SOCIAL & SUBSTANCE USE HISTORY  Social History   Socioeconomic History   Marital status: Divorced    Spouse name: Not on file   Number of children: 2   Years of education: Not on file   Highest education level: Not on file  Occupational History   Occupation: Product/process development scientist  Tobacco Use   Smoking status: Former    Current packs/day: 2.00    Types: Cigarettes   Smokeless tobacco: Never  Vaping Use   Vaping status: Not on file  Substance and Sexual Activity   Alcohol use: Yes    Comment: BAC was .23 on admission   Drug use: Yes    Types: Marijuana    Comment: UDS was negative   Sexual activity: Not Currently  Other Topics Concern   Not on file  Social History Narrative   Pt stated that he lives alone; works as a Product/process development scientist   Social Drivers of Corporate investment banker Strain: Not on file  Food Insecurity: No Food Insecurity (02/10/2022)   Hunger Vital Sign    Worried About Running Out of Food in the Last Year: Never true    Ran Out of Food in the Last  Year: Never true  Transportation Needs: No Transportation Needs (02/10/2022)   PRAPARE - Administrator, Civil Service (Medical): No    Lack of Transportation (Non-Medical): No  Physical Activity: Not on file  Stress: Not on file  Social Connections: Unknown (01/03/2022)   Received from [facility]   Social Network    Social Network: Not on file   Social History   Tobacco Use  Smoking Status Former   Current packs/day: 2.00   Types: Cigarettes  Smokeless Tobacco Never   Social History   Substance and Sexual Activity  Alcohol Use Yes   Comment: BAC was .23 on admission   Social History   Substance and  Sexual Activity  Drug Use Yes   Types: Marijuana   Comment: UDS was negative    .  FAMILY HISTORY  Family History  Problem Relation Age of Onset   Heart attack Paternal Grandfather    Stroke Mother    Heart failure Mother    Heart failure Father    Family Psychiatric History (if known):  Strong family history of anxiety, father's side  MENTAL STATUS EXAM (MSE)  Mental Status Exam: General Appearance: Fairly Groomed  Orientation:  Full (Time, Place, and Person)  Memory:  Immediate;   Fair Recent;   Fair Remote;   Fair  Concentration:  Concentration: Fair and Attention Span: Fair  Recall:  Fair  Attention  Poor  Eye Contact:  Minimal  Speech:  Slow  Language:  Good  Volume:  Decreased  Mood: I just don't care anymore  Affect:  Flat  Thought Process:  Coherent  Thought Content:  Logical  Suicidal Thoughts:  Yes.  with intent/plan  Homicidal Thoughts:  No  Judgement:  Impaired  Insight:  Shallow  Psychomotor Activity:  Negative  Akathisia:  Negative  Fund of Knowledge:  Fair    Assets:  Communication Skills Desire for Improvement Housing Vocational/Educational  Cognition:  WNL  ADL's:  Intact  AIMS (if indicated):       VITALS  Blood pressure 104/64, pulse 63, temperature 98.1 F (36.7 C), resp. rate 17, height 5' 9 (1.753 m), weight 68 kg, SpO2 98%.  LABS  Admission on 12/08/2023  Component Date Value Ref Range Status   Sodium 12/08/2023 136  135 - 145 mmol/L Final   Potassium 12/08/2023 3.3 (L)  3.5 - 5.1 mmol/L Final   Chloride 12/08/2023 103  98 - 111 mmol/L Final   CO2 12/08/2023 19 (L)  22 - 32 mmol/L Final   Glucose, Bld 12/08/2023 90  70 - 99 mg/dL Final   Glucose reference range applies only to samples taken after fasting for at least 8 hours.   BUN 12/08/2023 14  8 - 23 mg/dL Final   Creatinine, Ser 12/08/2023 0.72  0.61 - 1.24 mg/dL Final   Calcium 90/73/7974 8.7 (L)  8.9 - 10.3 mg/dL Final   Total Protein 90/73/7974 5.8 (L)  6.5 - 8.1 g/dL  Final   Albumin 90/73/7974 3.6  3.5 - 5.0 g/dL Final   AST 90/73/7974 16  15 - 41 U/L Final   ALT 12/08/2023 19  0 - 44 U/L Final   Alkaline Phosphatase 12/08/2023 62  38 - 126 U/L Final   Total Bilirubin 12/08/2023 0.6  0.0 - 1.2 mg/dL Final   GFR, Estimated 12/08/2023 >60  >60 mL/min Final   Comment: (NOTE) Calculated using the CKD-EPI Creatinine Equation (2021)    Anion gap 12/08/2023 14  5 - 15  Final   Performed at [facility], [address], [place], [place] [address]   Alcohol, Ethyl (B) 12/08/2023 84 (H)  <15 mg/dL Final   Comment: (NOTE) For medical purposes only. Performed at [facility], [address] [place], [place] [address]    Opiates 12/08/2023 NONE DETECTED  NONE DETECTED Final   Cocaine 12/08/2023 NONE DETECTED  NONE DETECTED Final   Benzodiazepines 12/08/2023 NONE DETECTED  NONE DETECTED Final   Amphetamines 12/08/2023 NONE DETECTED  NONE DETECTED Final   Tetrahydrocannabinol 12/08/2023 POSITIVE (A)  NONE DETECTED Final   Barbiturates 12/08/2023 NONE DETECTED  NONE DETECTED Final   Comment: (NOTE) DRUG SCREEN FOR MEDICAL PURPOSES ONLY.  IF CONFIRMATION IS NEEDED FOR ANY PURPOSE, NOTIFY LAB WITHIN 5 DAYS.  LOWEST DETECTABLE LIMITS FOR URINE DRUG SCREEN Drug Class                     Cutoff (ng/mL) Amphetamine and metabolites    1000 Barbiturate and metabolites    200 Benzodiazepine                 200 Opiates and metabolites        300 Cocaine and metabolites        300 THC                            50 Performed at [facility], [address] [place], [place] [address]    WBC 12/08/2023 10.4  4.0 - 10.5 K/uL Final   RBC 12/08/2023 4.42  4.22 - 5.81 MIL/uL Final   Hemoglobin 12/08/2023 14.6  13.0 - 17.0 g/dL Final   HCT 90/73/7974 42.2  39.0 - 52.0 % Final   MCV 12/08/2023 95.5  80.0 - 100.0 fL Final   MCH 12/08/2023 33.0  26.0 - 34.0 pg Final   MCHC 12/08/2023 34.6  30.0 - 36.0 g/dL Final   RDW 90/73/7974 12.7  11.5 - 15.5 %  Final   Platelets 12/08/2023 175  150 - 400 K/uL Final   nRBC 12/08/2023 0.0  0.0 - 0.2 % Final   Neutrophils Relative % 12/08/2023 53  % Final   Neutro Abs 12/08/2023 5.5  1.7 - 7.7 K/uL Final   Lymphocytes Relative 12/08/2023 29  % Final   Lymphs Abs 12/08/2023 3.0  0.7 - 4.0 K/uL Final   Monocytes Relative 12/08/2023 12  % Final   Monocytes Absolute 12/08/2023 1.3 (H)  0.1 - 1.0 K/uL Final   Eosinophils Relative 12/08/2023 3  % Final   Eosinophils Absolute 12/08/2023 0.3  0.0 - 0.5 K/uL Final   Basophils Relative 12/08/2023 1  % Final   Basophils Absolute 12/08/2023 0.1  0.0 - 0.1 K/uL Final   Immature Granulocytes 12/08/2023 2  % Final   Abs Immature Granulocytes 12/08/2023 0.25 (H)  0.00 - 0.07 K/uL Final   Performed at [facility], [address], [place], [place] [address]   Salicylate Lvl 12/08/2023 <7.0 (L)  7.0 - 30.0 mg/dL Final   Performed at [facility], [address] [place], [place] [address]    PSYCHIATRIC REVIEW OF SYSTEMS (ROS)  ROS: Notable for the following relevant positive findings: Review of Systems  Constitutional: Negative.   HENT: Negative.    Eyes: Negative.   Respiratory: Negative.    Cardiovascular: Negative.   Gastrointestinal: Negative.   Genitourinary: Negative.   Musculoskeletal: Negative.  Skin: Negative.   Neurological: Negative.   Endo/Heme/Allergies: Negative.   Psychiatric/Behavioral:  Positive for depression, substance abuse and suicidal ideas. The patient is nervous/anxious.     Additional findings:      Musculoskeletal: No abnormal movements observed      Gait & Station: Normal      Pain Screening: Denies      Nutrition & Dental Concerns: Reviewed   RISK FORMULATION/ASSESSMENT  Is the patient experiencing any suicidal or homicidal ideations: Yes        Explain if yes:  Patient continues with depression and anxiety, with suicidal intent for cutting his wrists.  Protective factors considered for safety management:    Patient just broke up with his girlfriend, and some issues dealing with his son, so feeling without much social support  Risk factors/concerns considered for safety management:  Prior attempt Depression Substance abuse/dependence Recent loss Access to lethal means Age over 25 Hopelessness Impulsivity Isolation Barriers to accessing treatment Male gender Unmarried  Is there a safety management plan with the patient and treatment team to minimize risk factors and promote protective factors: No           Explain: As above, patient without much reliable social support at this point  Is crisis care placement or psychiatric hospitalization recommended: Yes     Based on my current evaluation and risk assessment, patient is determined at this time to be at:  High risk  *RISK ASSESSMENT Risk assessment is a dynamic process; it is possible that this patient's condition, and risk level, may change. This should be re-evaluated and managed over time as appropriate. Please re-consult psychiatric consult services if additional assistance is needed in terms of risk assessment and management. If your team decides to discharge this patient, please advise the patient how to best access emergency psychiatric services, or to call 911, if their condition worsens or they feel unsafe in any way.   Adriana JINNY Pontes, MD Telepsychiatry Consult Services
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
## 2023-12-09 NOTE — Tx Team (Signed)
 Initial Treatment Plan 12/09/2023 11:43 AM AVYN COATE FMW:[id number]    PATIENT STRESSORS: Other: relationship ending work     PATIENT STRENGTHS: Other: I don't know   PATIENT IDENTIFIED PROBLEMS: Suicidal thoughts                     DISCHARGE CRITERIA:    PRELIMINARY DISCHARGE PLAN:   PATIENT/FAMILY INVOLVEMENT: This treatment plan has been presented to and reviewed with the patient, NATASHA PAULSON, and/or family member. The patient and family have been given the opportunity to ask questions and make suggestions.  Prentice JINNY Angle, RN 12/09/2023, 11:43 AM

## 2023-12-09 NOTE — ED Notes (Signed)
 Prentice from [facility] called  and report given . Patient escorted to safe transport car per sitter.

## 2023-12-09 NOTE — Progress Notes (Signed)
 Adult Psychoeducational Group Note  Date:  12/09/2023 Time:  9:55 PM  Group Topic/Focus:  Goals Group:   The focus of this group is to help patients establish daily goals to achieve during treatment and discuss how the patient can incorporate goal setting into their daily lives to aide in recovery.  Participation Level:  Active  Participation Quality:  Appropriate  Affect:  Appropriate  Cognitive:  Appropriate  Insight: Appropriate  Engagement in Group:  Engaged  Modes of Intervention:  Discussion  Additional Comments:  Pt stated he was admitted to the hospital midmorning, and his day was good.  Pt stated he was able to get some sleep and eat well.  Daine Pillar D 12/09/2023, 9:55 PM

## 2023-12-09 NOTE — ED Notes (Signed)
 1 bag of belongings given  driver to take to [facility]

## 2023-12-09 NOTE — [facility] Suicide Risk Assessment (Signed)
 Suicide Risk Assessment  Admission Assessment    [facility] Admission Suicide Risk Assessment   Nursing information obtained from:  Patient Demographic factors:  Male, Caucasian, Age 65 or older, Living alone Current Mental Status:  Suicidal ideation indicated by patient Loss Factors:  Loss of significant relationship Historical Factors:  Prior suicide attempts Risk Reduction Factors:  Religious beliefs about death  Total Time spent with patient: 1 hour Principal Problem: GAD (generalized anxiety disorder) Diagnosis:  Principal Problem:   GAD (generalized anxiety disorder) Active Problems:   Chronic prescription benzodiazepine use   Subjective Data:   Zachary [name] is a 63 y.o., male with a past psychiatric history significant for MDD, GAD and alcohol use disorder who presents to the [facility] Voluntary from [facility] Emergency Department for evaluation and management of SI with plan to slit his wrist.    Initial assessment on 9/27, patient was evaluated on the inpatient unit, the patient reports having a chest cold three days ago and thus he started getting behind at work and having conflict with his girlfriend and they went through a break up. He states his mind was racing and he had thoughts of wanting to be alive anymore. He denied having a plan to kill himself and he stated that he admitted himself to the hospital because he was afraid of his SI escalating. He feels like the prednisone  that was prescirbed for his lung infection increased his anxiety and could not control his anxiety. He states that he has not slept all week and this was due to running out of Klonopin . He reports that with the increased stressors, he started taking his Klonopin  more often which caused him to run out about one week early. He states the lack of Klonopin , getting put on prednisone  for the lung infection, and the psychosocial stressors have caused him to have SI. He notes having less pleasure  in his life and attributes this to working an increased amount. He notes feeling hopeless for the future. He notes having poor energy and focus. He is currently denying SI and contracts for safety. He plans on following up with his psychiatrist to continue his Klonopin . Per PDMP, he is filling the medication appropriately.   Continued Clinical Symptoms:    The Alcohol Use Disorders Identification Test, Guidelines for Use in Primary Care, Second Edition.  World Science writer [facility]). Score between 0-7:  no or low risk or alcohol related problems. Score between 8-15:  moderate risk of alcohol related problems. Score between 16-19:  high risk of alcohol related problems. Score 20 or above:  warrants further diagnostic evaluation for alcohol dependence and treatment.   CLINICAL FACTORS:   Severe Anxiety and/or Agitation   Musculoskeletal: Strength & Muscle Tone: within normal limits Gait & Station: normal Patient leans: N/A  Psychiatric Specialty Exam:  Psychiatric Status Exam  Presentation  General Appearance: Appropriate for Environment   Eye Contact:Fair   Speech:Clear and Coherent   Speech Volume:Normal   Handedness:Right   Mood and Affect  Mood:Depressed; Hopeless; Irritable   Affect:Depressed; Congruent    Thought Process  Thought Processes:Coherent; Linear   Duration of Psychotic Symptoms: NA Past Diagnosis of Schizophrenia or Psychoactive disorder: No Descriptions of Associations:Intact   Orientation:Full (Time, Place and Person)   Thought Content:Logical   Hallucinations:Hallucinations: None   Ideas of Reference:None   Suicidal Thoughts:Suicidal Thoughts: No   Homicidal Thoughts:Homicidal Thoughts: No    Sensorium  Memory:Remote Good   Judgment:Impaired   Insight:Fair  Executive Functions  Concentration:Fair   Attention Span:Fair   Recall:Fair   [employer] of Knowledge:Fair   Language:Fair    Psychomotor  Activity  Psychomotor Activity:Psychomotor Activity: Normal    Assets  Assets:Resilience; Desire for Improvement    Physical Exam:  Vitals reviewed.  Constitutional:      Appearance: Normal appearance.  HENT:     Head: Normocephalic and atraumatic.  Cardiovascular:     Rate and Rhythm: Normal rate.  Pulmonary:     Effort: Pulmonary effort is normal.  Neurological:     General: No focal deficit present.     Mental Status: He is alert.     Review of Systems  Constitutional:  Negative for chills and fever.  Respiratory:  Negative for shortness of breath.   Cardiovascular:  Negative for chest pain and palpitations.  Gastrointestinal:  Negative for nausea and vomiting.  Neurological:  Negative for headaches.    COGNITIVE FEATURES THAT CONTRIBUTE TO RISK:  Closed-mindedness    SUICIDE RISK:  Moderate:  Frequent suicidal ideation with limited intensity, and duration, some specificity in terms of plans, no associated intent, good self-control, limited dysphoria/symptomatology, some risk factors present, and identifiable protective factors, including available and accessible social support.  PLAN OF CARE: See H&P for assessment and plan.   I certify that inpatient services furnished can reasonably be expected to improve the patient's condition.   Ismael KATHEE Franco, MD 12/09/2023, 3:09 PM

## 2023-12-09 NOTE — Group Note (Signed)
 Date:  12/09/2023 Time:  11:48 AM  Group Topic/Focus:  Exploring control, boundaries, and coping mechanisms  Patients engaged in a creative exercise where they each drew on a blank piece of paper, then passed their papers around, adding to each other's drawings. This activity helped explore the experience of sharing control. Afterward, the group reflected on how it felt to let others contribute to their drawings, and how this mirrors real-life experiences of navigating change.  The conversation then focused on boundaries, with worksheets and scenarios sparking a discussion on healthy vs. unhealthy boundaries and coping strategies. Patients explored how these concepts connect to personal control and how they can positively impact relationships and daily life. The session ended with a focus on how setting healthy boundaries and using positive coping strategies can help restore a sense of control and emotional well-being.  Participation Level:  Did Not Attend  Participation Quality:  N/A  Affect:  N/A  Cognitive:  N/A  Insight: None  Engagement in Group:  None  Modes of Intervention:  N/A  Additional Comments:  Patient was not admitted to [facility] at the time this note was created nor was he physically here to participate in this group.  Kristi HERO Tyleek Smick 12/09/2023, 11:48 AM

## 2023-12-09 NOTE — ED Notes (Signed)
 Called [facility] to given report , nurse unavailable will call back

## 2023-12-09 NOTE — Group Note (Signed)
 Date:  12/09/2023 Time:  5:04 PM  Group Topic/Focus:  Developing a Wellness Toolbox:   The focus of this group is to help patients develop a wellness toolbox with skills and strategies to promote recovery upon discharge. Identifying Needs:   The focus of this group is to help patients identify their personal needs that have been historically problematic and identify healthy behaviors to address their needs.    Participation Level:  Did Not Attend  Participation Quality:    Affect:    Cognitive:   Insight:   Engagement in Group:    Modes of Intervention:    Additional Comments:    Eleanor JAYSON Metro 12/09/2023, 5:04 PM

## 2023-12-09 NOTE — ED Provider Notes (Signed)
 Emergency Medicine Observation Re-evaluation Note  Zachary [name] is a 62 y.o. male, seen on rounds today.  Pt initially presented to the ED for complaints of Suicidal Currently, the patient is sleeping.  Physical Exam  BP 111/61 (BP Location: Left Arm)   Pulse 76   Temp 98.4 F (36.9 C) (Oral)   Resp 17   Ht 1.753 m (5' 9)   Wt 68 kg   SpO2 97%   BMI 22.15 kg/m  Physical Exam General: No acute distress Cardiac: Regular rate Lungs: Normal effort Psych: Deferred, sleeping  ED Course / MDM  EKG:   I have reviewed the labs performed to date as well as medications administered while in observation.  Recent changes in the last 24 hours include initial medical evaluation.  Patient was also assessed by the psychiatry.  Plan  Current plan is for inpatient psychiatric admission.    Randol Simmonds, MD 12/09/23 [phone]

## 2023-12-09 NOTE — ED Notes (Signed)
 Sitter at bedside.

## 2023-12-09 NOTE — Group Note (Signed)
 LCSW Group Therapy Note   Group Date: 12/09/2023 Start Time: 1300 End Time: 1400   Type of Therapy and Topic:  Group Therapy: Boundaries  Participation Level:  Did Not Attend  Description of Group: This group will address the use of boundaries in their personal lives. Patients will explore why boundaries are important, the difference between healthy and unhealthy boundaries, and negative and postive outcomes of different boundaries and will look at how boundaries can be crossed.  Patients will be encouraged to identify current boundaries in their own lives and identify what kind of boundary is being set. Facilitators will guide patients in utilizing problem-solving interventions to address and correct types boundaries being used and to address when no boundary is being used. Understanding and applying boundaries will be explored and addressed for obtaining and maintaining a balanced life. Patients will be encouraged to explore ways to assertively make their boundaries and needs known to significant others in their lives, using other group members and facilitator for role play, support, and feedback.  Therapeutic Goals:  1.  Patient will identify areas in their life where setting clear boundaries could be  used to improve their life.  2.  Patient will identify signs/triggers that a boundary is not being respected. 3.  Patient will identify two ways to set boundaries in order to achieve balance in  their lives: 4.  Patient will demonstrate ability to communicate their needs and set boundaries  through discussion and/or role plays  Summary of Patient Progress:  Pt was invited but did not attend  Therapeutic Modalities:   Cognitive Behavioral Therapy Solution-Focused Therapy  Golda Louder, LCSWA 12/09/2023  3:03 PM

## 2023-12-09 NOTE — BH Assessment (Signed)
 Consult deferred to IRIS provider @ 1200am. Spoke with Faith who has added this patient to the list to be seen. Secure message sent to ED care team and provider for awareness.    TTS clinician will remain available to assist w/ coordination.

## 2023-12-09 NOTE — ED Notes (Signed)
 Attempted to call for report RN not available will call back.

## 2023-12-10 MED ORDER — ADULT MULTIVITAMIN W/MINERALS CH
1.0000 | ORAL_TABLET | Freq: Every day | ORAL | Status: DC
  Administered 2023-12-10 – 2023-12-13 (×4): 1 via ORAL
  Filled 2023-12-10 (×4): qty 1

## 2023-12-10 MED ORDER — VITAMIN B-1 100 MG PO TABS
100.0000 mg | ORAL_TABLET | Freq: Every day | ORAL | Status: DC
  Administered 2023-12-10 – 2023-12-13 (×4): 100 mg via ORAL
  Filled 2023-12-10 (×4): qty 1

## 2023-12-10 MED ORDER — ONDANSETRON 4 MG PO TBDP: 4.0000 mg | ORAL_TABLET | Freq: Four times a day (QID) | ORAL | Status: AC | PRN

## 2023-12-10 MED ORDER — LOPERAMIDE HCL 2 MG PO CAPS: 2.0000 mg | ORAL_CAPSULE | ORAL | Status: AC | PRN

## 2023-12-10 MED ORDER — ADULT MULTIVITAMIN W/MINERALS CH
1.0000 | ORAL_TABLET | Freq: Every day | ORAL | Status: DC
Start: 2023-12-10 — End: 2023-12-10

## 2023-12-10 MED ORDER — CHLORDIAZEPOXIDE HCL 25 MG PO CAPS: 25.0000 mg | ORAL_CAPSULE | Freq: Four times a day (QID) | ORAL | Status: AC | PRN

## 2023-12-10 NOTE — Progress Notes (Signed)
 Tour of Duty:  Zachary JINNY Angle, RN, 12/10/23, Tour of Duty: 0700-1900  SI/HI/AVH: Denies  Self-Reported   Mood: Positive  Anxiety: Denies, but Observable Depression: Denies Irritability: Denies  Broset  Violence Prevention Guidelines *See Row Information*: Small Violence Risk interventions implemented   LBM  Last BM Date : 12/10/23   Pain: not present  Patient Refusals (including Rx): No  Shift Summary: Patient observed to be calm on unit. Patient able to make needs known. Patient observed to engage appropriately with staff and peers. Patient taking medications as prescribed. This shift, no PRN medication requested or required. No observed or reported side effects to medication. No observed or reported agitation, aggression, or other acute emotional distress. No observed or reported physical abnormalities or concerns.  Last Vitals  Vitals Weight: 66.8 kg Temp: 98.5 F (36.9 C) Temp Source: Oral Pulse Rate: (!) 58 Resp: 18 BP: 126/65 Patient Position: (not recorded)  Admission Type  Psych Admission Type (Psych Patients Only) Admission Status: Voluntary Date 72 hour document signed : (not recorded) Time 72 hour document signed : (not recorded) Provider Notified (First and Last Name) (see details for LINK to note): (not recorded)   Psychosocial Assessment  Psychosocial Assessment Patient Complaints: None Eye Contact: Avoids Facial Expression: Anxious Affect: Anxious Speech: Logical/coherent Interaction: Assertive Motor Activity: Restless Appearance/Hygiene: Unremarkable Behavior Characteristics: Cooperative, Anxious Mood: Anxious   Aggressive Behavior  Targets: (not recorded)   Thought Process  Thought Process Coherency: Within Defined Limits Content: Preoccupation Delusions: None reported or observed Perception: Within Defined Limits Hallucination: None reported or observed Judgment: Impaired Confusion: None  Danger to Self/Others  Danger to  Self Current suicidal ideation?: Passive Description of Suicide Plan: (not recorded) Self-Injurious Behavior: (not recorded) Agreement Not to Harm Self: (not recorded) Description of Agreement: (not recorded) Danger to Others: None reported or observed

## 2023-12-10 NOTE — Group Note (Signed)
 Date:  12/10/2023 Time:  7:28 PM  Group Topic/Focus:  Group used a non- competitive card game to build mindful listening skills, and encourage acceptance and understanding of oneself and peers. Patients share, fostering empathy and personal growth in a supportive environment.    Participation Level:  Active  Participation Quality:  Appropriate and Attentive  Affect:  Appropriate  Cognitive:  Alert and Appropriate  Insight: Appropriate  Engagement in Group:  Engaged and Improving  Modes of Intervention:  Activity, Discussion, Exploration, Rapport Building, Socialization, and Support  Additional Comments:    Zachary [name] 12/10/2023, 7:28 PM

## 2023-12-10 NOTE — Progress Notes (Signed)
 [facility] MD Progress Note  12/10/2023 8:54 AM OLAN KUREK  MRN:  [id number]   Reason for Admission:  Zachary [name] is a 63 y.o., male with a past psychiatric history significant for MDD, GAD and alcohol use disorder who presents to the [facility] Voluntary from [facility] Emergency Department for evaluation and management of SI with plan to slit his wrist.   Chart Review from last 24 hours:  The patient's chart was reviewed and nursing notes were reviewed. Significant vital signs: stable. The patient's case was discussed in multidisciplinary team meeting. Per [facility], patient was taking medications appropriately. Patient received the following PRN medications: atarax  and zyprexa at 2117. Per nursing, nurse attempted to educate patient on coping skills for anxiety and told him he might not have to rely so heavily on clonazepam . Patient became upset and escalated and followed the nurse down the hallway with a  raised voice. He did not attend groups.   Information Obtained Today During Patient Interview: The patient was seen in his room resting in bed, no acute distress. On assessment, the patient feels okay today. When asked about speaking with family or friends, he states that he spoke to his ex girlfriend and he states that the conversation went poorly but is coping fairly. Patient reports having good sleep and good appetite. Patient feels that the medications have been helpful with his anxiety and he notes finding benefit from being on the Remeron with helping increase his sleep and appetite and denies adverse effects.   Patient denies current SI, HI, AVH.    Principal Problem: GAD (generalized anxiety disorder) Diagnosis: Principal Problem:   GAD (generalized anxiety disorder) Active Problems:   Chronic prescription benzodiazepine use    Past Psychiatric History: Current medications: Klonopin  since 2020 (mainly filled by Dr. Tillman, most recent fill was 9/25 one day  supply and previously was filling monthly appropriately), Seroquel  Current psychiatrist: Telemed through [facility]- once every 4 months Current therapist: Telemed through [facility]   Previous psychiatric diagnoses: MDD, GAD Prior medications: Cymbalta  (feels it was ineffective), Paxil (ineffective), Zoloft  (tried it), Lexapro  (does not recall working at all), Prozac   Psychiatric medication compliance history: poor   Previous hospitalizations most recently admitted to [facility] in 2023 due to increased depression and passive SI after OD on Klonopin ; Has been a patient at the [facility], [facility], [facility], [facility] & [facility].  He has been treated with multiple medications in the past, but was never compliant.   History of suicide attempts: yes, most recently 2.5 years ago (slight his wrist) History of self harm: other than the suicide attempt, denies   Substance Use History (onset/amount/frequency/most recent use date): Alcohol: drank once after 2 years of sobriety- 8-10 beers and  Hx withdrawal tremors/shakes: denies Hx alcohol related blackouts denies Hx alcohol induced hallucinations: denies Hx alcoholic seizures: denies DUI: denies   Tobacco: 2 cigars daily Marijuana: weekly, CBD pipe Cocaine: denies Methamphetamines: denies Opiates: denies Benzodiazepines: using prescribed Klonopin  Ecstasy: denies   History of Detox / Rehab: yes, plenty   Substance Abuse History in the last 12 months: Yes   Past Medical/Surgical History:  Medical Diagnoses: COPD Home Rx: Doxycycline  for lung infection Prior Hospitalization: none recently Prior Surgeries: denies   Head trauma: yes multiple Seizures: denies   Allergies: statin (palpitations) and penicillin (unsure of reaction)   Family History:  Alcoholism: Mother, brother & father.    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 Weapons: denies  Current Medications: Current Facility-Administered Medications  Medication Dose Route Frequency Provider Last Rate Last Admin   acetaminophen  (TYLENOL ) tablet 650 mg  650 mg Oral Q6H PRN Randall Starlyn HERO, NP       albuterol  (VENTOLIN  HFA) 108 (90 Base) MCG/ACT inhaler 2 puff  2 puff Inhalation Q4H PRN Randall, Veronique M, NP       alum & mag hydroxide-simeth (MAALOX/MYLANTA) 200-200-20 MG/5ML suspension 30 mL  30 mL Oral Q4H PRN Randall, Veronique M, NP       chlordiazePOXIDE  (LIBRIUM ) capsule 25 mg  25 mg Oral Q6H PRN Lexy Meininger B, MD       clonazePAM  (KLONOPIN ) tablet 1 mg  1 mg Oral TID Byungura, Veronique M, NP   1 mg at 12/10/23 0840   doxycycline  (VIBRA -TABS) tablet 100 mg  100 mg Oral BID Randall Starlyn HERO, NP   100 mg at 12/10/23 0840   hydrOXYzine  (ATARAX ) tablet 25 mg  25 mg Oral Q8H PRN Blaike Vickers B, MD   25 mg at 12/09/23 2117   Influenza vac split trivalent PF (FLUZONE HIGH-DOSE) injection 0.5 mL  0.5 mL Intramuscular Tomorrow-1000 Bouchard, Marc A, DO       loperamide  (IMODIUM ) capsule 2-4 mg  2-4 mg Oral PRN Eleonore Shippee B, MD       magnesium  hydroxide (MILK OF MAGNESIA) suspension 30 mL  30 mL Oral Daily PRN Randall, Veronique M, NP       mirtazapine (REMERON) tablet 7.5 mg  7.5 mg Oral QHS Antavius Sperbeck B, MD   7.5 mg at 12/09/23 2117   multivitamin with minerals tablet 1 tablet  1 tablet Oral QPC breakfast Prentis Kitchens A, DO   1 tablet at 12/10/23 0840   OLANZapine (ZYPREXA) injection 5 mg  5 mg Intramuscular TID PRN Byungura, Veronique M, NP       OLANZapine zydis (ZYPREXA) disintegrating tablet 5 mg  5 mg Oral TID PRN Randall Starlyn HERO, NP   5 mg at 12/09/23 2117   ondansetron  (ZOFRAN -ODT) disintegrating tablet 4 mg  4 mg Oral Q6H PRN Rayden Dock B, MD       thiamine  (Vitamin B-1) tablet 100 mg  100 mg Oral Daily Jadaya Sommerfield B, MD   100 mg at 12/10/23 0840    Lab Results:   Results for orders placed or performed during the hospital encounter of 12/08/23 (from the past 48 hours)  Comprehensive metabolic panel     Status: Abnormal   Collection Time: 12/08/23  6:53 PM  Result Value Ref Range   Sodium 136 135 - 145 mmol/L   Potassium 3.3 (L) 3.5 - 5.1 mmol/L   Chloride 103 98 - 111 mmol/L   CO2 19 (L) 22 - 32 mmol/L   Glucose, Bld 90 70 - 99 mg/dL    Comment: Glucose reference range applies only to samples taken after fasting for at least 8 hours.   BUN 14 8 - 23 mg/dL   Creatinine, Ser 9.27 0.61 - 1.24 mg/dL   Calcium 8.7 (L) 8.9 - 10.3 mg/dL   Total Protein 5.8 (L) 6.5 - 8.1 g/dL   Albumin 3.6 3.5 - 5.0 g/dL   AST 16 15 - 41 U/L   ALT 19 0 - 44 U/L   Alkaline Phosphatase 62 38 - 126 U/L   Total Bilirubin 0.6 0.0 - 1.2 mg/dL   GFR, Estimated >39 >39 mL/min  Comment: (NOTE) Calculated using the CKD-EPI Creatinine Equation (2021)    Anion gap 14 5 - 15    Comment: Performed at [facility], [address], [place], [place] [address]  Ethanol     Status: Abnormal   Collection Time: 12/08/23  6:53 PM  Result Value Ref Range   Alcohol, Ethyl (B) 84 (H) <15 mg/dL    Comment: (NOTE) For medical purposes only. Performed at [facility], [address], [place], [place] [address]   CBC with Differential     Status: Abnormal   Collection Time: 12/08/23  6:53 PM  Result Value Ref Range   WBC 10.4 4.0 - 10.5 K/uL   RBC 4.42 4.22 - 5.81 MIL/uL   Hemoglobin 14.6 13.0 - 17.0 g/dL   HCT 57.7 60.9 - 47.9 %   MCV 95.5 80.0 - 100.0 fL   MCH 33.0 26.0 - 34.0 pg   MCHC 34.6 30.0 - 36.0 g/dL   RDW 87.2 88.4 - 84.4 %   Platelets 175 150 - 400 K/uL   nRBC 0.0 0.0 - 0.2 %   Neutrophils Relative % 53 %   Neutro Abs 5.5 1.7 - 7.7 K/uL   Lymphocytes Relative 29 %   Lymphs Abs 3.0 0.7 - 4.0 K/uL   Monocytes Relative 12 %   Monocytes Absolute 1.3 (H) 0.1 - 1.0 K/uL   Eosinophils Relative 3 %   Eosinophils Absolute 0.3 0.0 - 0.5 K/uL   Basophils  Relative 1 %   Basophils Absolute 0.1 0.0 - 0.1 K/uL   Immature Granulocytes 2 %   Abs Immature Granulocytes 0.25 (H) 0.00 - 0.07 K/uL    Comment: Performed at [facility], [address] [place], [place] [address]  Salicylate level     Status: Abnormal   Collection Time: 12/08/23  6:53 PM  Result Value Ref Range   Salicylate Lvl <7.0 (L) 7.0 - 30.0 mg/dL    Comment: Performed at [facility], [address] [place], [place] [address]    Blood Alcohol level:  Lab Results  Component Value Date   ETH 84 (H) 12/08/2023   ETH <10 02/09/2022    Metabolic Disorder Labs: Lab Results  Component Value Date   HGBA1C 5.1 02/11/2022   MPG 100 02/11/2022   MPG 93.93 07/22/2021   No results found for: PROLACTIN Lab Results  Component Value Date   CHOL 235 (H) 02/11/2022   TRIG 89 02/11/2022   HDL 54 02/11/2022   CHOLHDL 4.4 02/11/2022   VLDL 18 02/11/2022   LDLCALC 163 (H) 02/11/2022   LDLCALC 94 07/22/2021    Physical Findings: AIMS:  , ,  ,  ,    CIWA:    COWS:     Musculoskeletal: Strength & Muscle Tone: within normal limits Gait & Station: normal Patient leans: N/A  Psychiatric Status Exam  Presentation  General Appearance:  Appropriate for Environment; Fairly Groomed   Eye Contact: Good   Speech: Clear and Coherent; Normal Rate   Speech Volume: Normal   Handedness: Right   Mood and Affect  Mood: Euthymic; Anxious   Affect: Congruent; Appropriate; Full Range    Thought Process  Thought Processes: Coherent; Goal Directed; Linear   Duration of Psychotic Symptoms: na Past Diagnosis of Schizophrenia or Psychoactive disorder: no Descriptions of Associations: Intact   Orientation: Full (Time, Place and Person)   Thought Content: Logical   Hallucinations: Hallucinations: None   Ideas of Reference:  None   Suicidal Thoughts: Suicidal Thoughts: No   Homicidal Thoughts: Homicidal Thoughts: No    Sensorium   Memory: Immediate Good; Recent Good; Remote Good   Judgment: Fair   Insight: Fair    Art therapist  Concentration: Good   Attention Span: Good   Recall: Good   Fund of Knowledge: Fair   Language: Fair    Psychomotor Activity  Psychomotor Activity: Psychomotor Activity: Normal    Assets  Assets: Communication Skills; Desire for Improvement; Resilience     Physical Exam Vitals reviewed.  Constitutional:      Appearance: Normal appearance.  HENT:     Head: Normocephalic and atraumatic.  Cardiovascular:     Rate and Rhythm: Normal rate.  Pulmonary:     Effort: Pulmonary effort is normal.  Neurological:     General: No focal deficit present.     Mental Status: He is alert.    Review of Systems  Constitutional:  Negative for chills and fever.  Respiratory:  Negative for shortness of breath.   Cardiovascular:  Negative for chest pain and palpitations.  Gastrointestinal:  Negative for nausea and vomiting.  Neurological:  Negative for headaches.     ASSESSMENT:  Diagnoses / Active Problems: Principal Problem: GAD (generalized anxiety disorder) Diagnosis: Principal Problem:   GAD (generalized anxiety disorder) Active Problems:   Chronic prescription benzodiazepine use   Zachary [name] is a 63 year old male with a past psychiatric history of GAD with panic attacks with chronic benzodiazepine use of Klonopin  who presents to [facility] due to increased depression and anxiety and active SI without a plan.    Yesterday, I continued his Klonopin  medication and started Remeron. Patient is doing better today regarding depression and anxiety. He notes benefit from starting the Remeron and feels less anxious and more stable now that his home Klonopin  has been restarted. Also likely to not experience alcohol withdraw from his one time use of alcohol relapse but will closely watch at this time.    PLAN: Safety and Monitoring:             -- Voluntary  admission to inpatient psychiatric unit for safety, stabilization and treatment             -- Daily contact with patient to assess and evaluate symptoms and progress in treatment             -- Patient's case to be discussed in multi-disciplinary team meeting             -- Observation Level : q15 minute checks             -- Vital signs:  q12 hours             -- Precautions: suicide, elopement, and assault   2. Medications:               Psychiatric Diagnosis and Treatment Chronic benzodiazepine use GAD with panic attacks - Continue home Klonopin  1 mg TID             - PDMP checked, getting Klonopin  refilled since 2023 - Continue Remeron 7.5 mg nightly  - Atarax  25 mg TID as needed for anxiety - Agitation Protocol: Zyprexa   Alcohol Use - recent relapse Drank 10 beers on 9/26, was sober for 2 years prior -CIWA -Multivitamin -Thiamine  100 mg daily -PRN Librium  -PRN Immodium  Medical Diagnosis and Treatment COPD exacerbation- continue home doxycycline  for 9 more doses Hypokalemia 3.3 - 20 meq  x2 - CMP pending Patient does not need nicotine  replacement   Other as needed medications  Tylenol  650 mg every 6 hours as needed for pain Mylanta 30 mL every 4 hours as needed for indigestion Milk of magnesia 30 mL daily as needed for constipation     The risks/benefits/side-effects/alternatives to the above medication were discussed in detail with the patient and time was given for questions. The patient consents to medication trial. FDA black box warnings, if present, were discussed.   The patient is agreeable with the medication plan, as above. We will monitor the patient's response to pharmacologic treatment, and adjust medications as necessary.   3. Routine and other pertinent labs: EKG monitoring: QTc: 374   Metabolism / endocrine: BMI: Body mass index is 21.74 kg/m.     CBC: unremarkable CMP: K 3.3 UDS: positive THC Ethanol: 84   4. Group Therapy:             --  Encouraged patient to participate in unit milieu and in scheduled group therapies              -- Short Term Goals: Ability to identify changes in lifestyle to reduce recurrence of condition will improve, Ability to verbalize feelings will improve, Ability to disclose and discuss suicidal ideas, Ability to demonstrate self-control will improve, Ability to identify and develop effective coping behaviors will improve, Ability to maintain clinical measurements within normal limits will improve, Compliance with prescribed medications will improve, and Ability to identify triggers associated with substance abuse/mental health issues will improve             -- Long Term Goals: Improvement in symptoms so as ready for discharge -- Patient is encouraged to participate in group therapy while admitted to the psychiatric unit. -- We will address other chronic and acute stressors, which contributed to the patient's GAD (generalized anxiety disorder) in order to reduce the risk of self-harm at discharge.   5. Discharge Planning:              -- Social work and case management to assist with discharge planning and identification of hospital follow-up needs prior to discharge             -- Estimated LOS: 5-7 days             -- Discharge Concerns: Need to establish a safety plan; Medication compliance and effectiveness             -- Discharge Goals: Return home with outpatient referrals for mental health follow-up including medication management/psychotherapy   I certify that inpatient services furnished can reasonably be expected to improve the patient's condition.      Ismael KATHEE Franco, MD 12/10/2023, 8:54 AM

## 2023-12-10 NOTE — Group Note (Signed)
 Date:  12/10/2023 Time:  11:00 AM  Group Topic/Focus:  Goals Group:   The focus of this group is to help patients establish daily goals to achieve during treatment and discuss how the patient can incorporate goal setting into their daily lives to aide in recovery.    Participation Level:  Active  Participation Quality:  Attentive  Affect:  Appropriate  Cognitive:  Appropriate  Insight: Good  Engagement in Group:  Engaged  Modes of Intervention:  Education  Additional Comments:  Travius is ready to let go of things and people that are not good for him.  [name] 12/10/2023, 11:00 AM

## 2023-12-10 NOTE — Plan of Care (Signed)
   Problem: Education: Goal: Knowledge of [facility] Education information/materials will improve Outcome: Progressing Goal: Emotional status will improve Outcome: Progressing Goal: Mental status will improve Outcome: Progressing Goal: Verbalization of understanding the information provided will improve Outcome: Progressing

## 2023-12-10 NOTE — BH Assessment (Signed)
(  Sleep Hours) - 10.25 (Any PRNs that were needed, meds refused, or side effects to meds)-  (Any disturbances and when (visitation, over night)- None (Concerns raised by the patient)- None (SI/HI/AVH)- Denies

## 2023-12-10 NOTE — Plan of Care (Signed)
   Problem: Education: Goal: Emotional status will improve Outcome: Progressing Goal: Mental status will improve Outcome: Progressing
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
## 2023-12-10 NOTE — BHH Group Notes (Signed)
 BHH Group Notes:  (Nursing/MHT/Case Management/Adjunct)  Date:  12/10/2023  Time:  11:21 PM  Type of Therapy:  Wrap-up group  Participation Level:  Active  Participation Quality:  Appropriate  Affect:  Appropriate  Cognitive:  Appropriate  Insight:  Appropriate  Engagement in Group:  Engaged  Modes of Intervention:  Education  Summary of Progress/Problems: Goal to process his life.  Zachary [name] 12/10/2023, 11:21 PM

## 2023-12-11 ENCOUNTER — Encounter (HOSPITAL_COMMUNITY): Payer: Self-pay

## 2023-12-11 LAB — COMPREHENSIVE METABOLIC PANEL WITH GFR
ALT: 20 U/L (ref 0–44)
AST: 17 U/L (ref 15–41)
Albumin: 4.1 g/dL (ref 3.5–5.0)
Alkaline Phosphatase: 82 U/L (ref 38–126)
Anion gap: 9 (ref 5–15)
BUN: 16 mg/dL (ref 8–23)
CO2: 25 mmol/L (ref 22–32)
Calcium: 9.2 mg/dL (ref 8.9–10.3)
Chloride: 109 mmol/L (ref 98–111)
Creatinine, Ser: 0.83 mg/dL (ref 0.61–1.24)
GFR, Estimated: >60 mL/min (ref >60)
Glucose, Bld: 121 mg/dL — ABNORMAL HIGH (ref 70–99)
Potassium: 4 mmol/L (ref 3.5–5.1)
Sodium: 143 mmol/L (ref 135–145)
Total Bilirubin: 0.2 mg/dL (ref 0.0–1.2)
Total Protein: 5.8 g/dL — ABNORMAL LOW (ref 6.5–8.1)

## 2023-12-11 MED ORDER — INFLUENZA VAC SPLIT HIGH-DOSE 0.5 ML IM SUSY
0.5000 mL | PREFILLED_SYRINGE | Freq: Once | INTRAMUSCULAR | Status: AC
  Administered 2023-12-11: 0.5 mL via INTRAMUSCULAR
  Filled 2023-12-11: qty 0.5

## 2023-12-11 NOTE — Plan of Care (Signed)
   Problem: Activity: Goal: Interest or engagement in activities will improve Outcome: Progressing Goal: Sleeping patterns will improve Outcome: Progressing

## 2023-12-11 NOTE — Group Note (Signed)
 Date:  12/11/2023 Time:  10:41 AM  Group Topic/Focus:  Goals Group:   The focus of this group is to help patients establish daily goals to achieve during treatment and discuss how the patient can incorporate goal setting into their daily lives to aide in recovery.    Participation Level:  Did Not Attend  Participation Quality:  Did Not Attend  Affect:  Did Not Attend  Cognitive:  Did Not Attend  Insight: None  Engagement in Group:  Did Not Attend  Modes of Intervention:  Did Not Attend  Additional Comments:  Did Not Attend  [name] 12/11/2023, 10:41 AM
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
## 2023-12-11 NOTE — Group Note (Signed)
 Date:  12/11/2023 Time:  11:38 AM  Group Topic/Focus:  Emotional Education:   The focus of this group is to discuss what feelings/emotions are, and how they are experienced.    Participation Level:  Did Not Attend  Participation Quality:  Did Not Attend  Affect:  Did Not Attend  Cognitive:  Did Not Attend  Insight: None  Engagement in Group:  Did Not Attend  Modes of Intervention:  Did Not Attend  Additional Comments:  Did Not Attend  [name] 12/11/2023, 11:38 AM

## 2023-12-11 NOTE — Progress Notes (Signed)
 D) Pt received calm, visible, participating in milieu, and in no acute distress. Pt A & O x4. Pt denies SI, HI, A/ V H, depression, and pain at this time. Pt endorsed anxiety A) Pt encouraged to drink fluids. Pt encouraged to come to staff with needs. Pt encouraged to attend and participate in groups. Pt encouraged to set reachable goals.  R) Pt remained safe on unit, in no acute distress, will continue to assess.     12/10/23 2100  Psych Admission Type (Psych Patients Only)  Admission Status Voluntary  Psychosocial Assessment  Patient Complaints None  Eye Contact Fair  Facial Expression Animated  Affect Anxious  Speech Logical/coherent  Interaction Assertive  Motor Activity Other (Comment) (WNL)  Appearance/Hygiene Unremarkable  Behavior Characteristics Anxious  Mood Anxious  Thought Process  Coherency WDL  Content WDL  Delusions None reported or observed  Perception WDL  Hallucination None reported or observed  Judgment Poor  Confusion None  Danger to Self  Current suicidal ideation? Denies  Agreement Not to Harm Self Yes  Description of Agreement verbal  Danger to Others  Danger to Others None reported or observed

## 2023-12-11 NOTE — Progress Notes (Signed)
(  Sleep Hours) - 8.0 (Any PRNs that were needed, meds refused, or side effects to meds)- atarax  (Any disturbances and when (visitation, over night)- none (Concerns raised by the patient)- none (SI/HI/AVH)- denies all

## 2023-12-11 NOTE — Plan of Care (Signed)
   Problem: Education: Goal: Knowledge of [facility] Education information/materials will improve Outcome: Progressing   Problem: Activity: Goal: Interest or engagement in activities will improve Outcome: Progressing   Problem: Coping: Goal: Ability to verbalize frustrations and anger appropriately will improve Outcome: Progressing   Problem: Safety: Goal: Periods of time without injury will increase Outcome: Progressing

## 2023-12-11 NOTE — Group Note (Signed)
 Date:  12/11/2023 Time:  4:02 PM  Group Topic/Focus: Grief Market researcher  Emotional Education:   The focus of this group is to discuss what feelings/emotions are, and how they are experienced. Managing Feelings:   The focus of this group is to identify what feelings patients have difficulty handling and develop a plan to handle them in a healthier way upon discharge. Grief is a journey through emotions that can change  from day to day, or even moment to moment. These  twists and turns often feel unsettling--a lot like being  on a roller coaster. Assignment: Label Roller coaster for your unique emotional needs.   Additional Comments:  Patient did not attend   Nixon Kolton N Jheri Mitter 12/11/2023, 4:02 PM

## 2023-12-11 NOTE — Group Note (Signed)
 Recreation Therapy Group Note   Group Topic:Team Building  Group Date: 12/11/2023 Start Time: 0955 End Time: 1025 Facilitators: Fleda Pagel-McCall, LRT,CTRS Location: [address] Dayroom   Group Topic: Communication, Team Building, Problem Solving  Goal Area(s) Addresses:  Patient will effectively work with peer towards shared goal.  Patient will identify skills used to make activity successful.  Patient will identify how skills used during activity can be applied to reach post d/c goals.   Behavioral Response: Engaged  Intervention: STEM Activity- Glass blower/designer  Activity: Tallest [employer]. In teams of 5-6, patients were given 11 craft pipe cleaners. Using the materials provided, patients were instructed to compete again the opposing team(s) to build the tallest free-standing structure from floor level. The activity was timed; difficulty increased by Clinical research associate as Production designer, theatre/television/film continued.  Systematically resources were removed with additional directions for example, placing one arm behind their back, working in silence, and shape stipulations. LRT facilitated post-activity discussion reviewing team processes and necessary communication skills involved in completion. Patients were encouraged to reflect how the skills utilized, or not utilized, in this activity can be incorporated to positively impact support systems post discharge.  Education: Pharmacist, community, Scientist, physiological, Discharge Planning   Education Outcome: Acknowledges education/In group clarification offered/Needs additional education.    Affect/Mood: Appropriate   Participation Level: Engaged   Participation Quality: Independent   Behavior: Appropriate   Speech/Thought Process: Focused   Insight: Good   Judgement: Good   Modes of Intervention: STEM Activity   Patient Response to Interventions:  Engaged   Education Outcome:  In group clarification offered    Clinical  Observations/Individualized Feedback: Pt came in late but joined in with one of the groups. Pt was able to help restructure their tower and make it sturdy and tall. Pt was focused throughout group.      Plan: Continue to engage patient in RT group sessions 2-3x/week.   Mattalyn Anderegg-McCall, LRT,CTRS 12/11/2023 12:19 PM

## 2023-12-11 NOTE — Progress Notes (Signed)
 D: Patient is alert, oriented, pleasant, and cooperative. Denies SI, HI, AVH, and verbally contracts for safety. Patient reports he slept good last night with sleeping medication. Patient reports his appetite as good, energy level as low, and concentration as poor. Patient rates his depression 8/10, hopelessness 8/10, and anxiety 8/10. Patient reports headache.    A: Scheduled medications administered per MD order. PRN tylenol  administered. Support provided. Patient educated on safety on the unit and medications. Routine safety checks every 15 minutes. Patient stated understanding to tell nurse about any new physical symptoms. Patient understands to tell staff of any needs.     R: No adverse drug reactions noted. Patient remains safe at this time and will continue to monitor.    12/11/23 0900  Psych Admission Type (Psych Patients Only)  Admission Status Voluntary  Psychosocial Assessment  Patient Complaints None  Eye Contact Fair  Facial Expression Anxious  Affect Anxious  Speech Logical/coherent  Interaction Assertive  Motor Activity Other (Comment) (WNL)  Appearance/Hygiene Unremarkable  Behavior Characteristics Cooperative  Mood Anxious  Thought Process  Coherency WDL  Content WDL  Delusions None reported or observed  Perception WDL  Hallucination None reported or observed  Judgment Poor  Confusion None  Danger to Self  Current suicidal ideation? Denies  Self-Injurious Behavior No self-injurious ideation or behavior indicators observed or expressed   Agreement Not to Harm Self Yes  Description of Agreement verbal  Danger to Others  Danger to Others None reported or observed

## 2023-12-11 NOTE — BH IP Treatment Plan (Signed)
 Interdisciplinary Treatment and Diagnostic Plan Update  12/11/2023 Time of Session: 11:05am Zachary [name] MRN: [id number]  Principal Diagnosis: GAD (generalized anxiety disorder)  Secondary Diagnoses: Principal Problem:   GAD (generalized anxiety disorder) Active Problems:   Chronic prescription benzodiazepine use   Current Medications:  Current Facility-Administered Medications  Medication Dose Route Frequency Provider Last Rate Last Admin   acetaminophen  (TYLENOL ) tablet 650 mg  650 mg Oral Q6H PRN Randall Starlyn HERO, NP   650 mg at 12/11/23 9178   albuterol  (VENTOLIN  HFA) 108 (90 Base) MCG/ACT inhaler 2 puff  2 puff Inhalation Q4H PRN Byungura, Veronique M, NP       alum & mag hydroxide-simeth (MAALOX/MYLANTA) 200-200-20 MG/5ML suspension 30 mL  30 mL Oral Q4H PRN Randall, Veronique M, NP       chlordiazePOXIDE  (LIBRIUM ) capsule 25 mg  25 mg Oral Q6H PRN Hoang, Daniela B, MD       clonazePAM  (KLONOPIN ) tablet 1 mg  1 mg Oral TID Byungura, Veronique M, NP   1 mg at 12/11/23 1150   doxycycline  (VIBRA -TABS) tablet 100 mg  100 mg Oral BID Randall Starlyn HERO, NP   100 mg at 12/11/23 9178   hydrOXYzine  (ATARAX ) tablet 25 mg  25 mg Oral Q8H PRN Hoang, Daniela B, MD   25 mg at 12/10/23 2121   loperamide  (IMODIUM ) capsule 2-4 mg  2-4 mg Oral PRN Hoang, Daniela B, MD       magnesium  hydroxide (MILK OF MAGNESIA) suspension 30 mL  30 mL Oral Daily PRN Randall, Veronique M, NP       mirtazapine (REMERON) tablet 7.5 mg  7.5 mg Oral QHS Hoang, Daniela B, MD   7.5 mg at 12/10/23 2121   multivitamin with minerals tablet 1 tablet  1 tablet Oral QPC breakfast Prentis Kitchens A, DO   1 tablet at 12/11/23 0821   OLANZapine (ZYPREXA) injection 5 mg  5 mg Intramuscular TID PRN Byungura, Veronique M, NP       OLANZapine zydis (ZYPREXA) disintegrating tablet 5 mg  5 mg Oral TID PRN Randall Starlyn HERO, NP   5 mg at 12/09/23 2117   ondansetron  (ZOFRAN -ODT) disintegrating tablet 4 mg  4 mg Oral Q6H  PRN Hoang, Daniela B, MD       thiamine  (Vitamin B-1) tablet 100 mg  100 mg Oral Daily Hoang, Daniela B, MD   100 mg at 12/11/23 9178   PTA Medications: Medications Prior to Admission  Medication Sig Dispense Refill Last Dose/Taking   albuterol  (VENTOLIN  HFA) 108 (90 Base) MCG/ACT inhaler Inhale 2 puffs into the lungs every 4 (four) hours as needed for wheezing or shortness of breath. 1 each 0    doxycycline  (VIBRAMYCIN ) 100 MG capsule Take 1 capsule (100 mg total) by mouth 2 (two) times daily. 20 capsule 0    KLONOPIN  1 MG tablet Take 1 mg by mouth 3 (three) times daily.      predniSONE  (DELTASONE ) 50 MG tablet Take 1 tablet by mouth daily 4 tablet 0    QUEtiapine  (SEROQUEL ) 100 MG tablet Take 100 mg by mouth at bedtime as needed.       Patient Stressors: Other: relationship ending work    Patient Strengths: Other: I don't know  Treatment Modalities: Medication Management, Group therapy, Case management,  1 to 1 session with clinician, Psychoeducation, Recreational therapy.   Physician Treatment Plan for Primary Diagnosis: GAD (generalized anxiety disorder) Long Term Goal(s):     Short Term Goals: Ability to identify  changes in lifestyle to reduce recurrence of condition will improve Ability to verbalize feelings will improve Ability to disclose and discuss suicidal ideas Ability to demonstrate self-control will improve Ability to identify and develop effective coping behaviors will improve Ability to maintain clinical measurements within normal limits will improve Compliance with prescribed medications will improve Ability to identify triggers associated with substance abuse/mental health issues will improve  Medication Management: Evaluate patient's response, side effects, and tolerance of medication regimen.  Therapeutic Interventions: 1 to 1 sessions, Unit Group sessions and Medication administration.  Evaluation of Outcomes: Not Progressing  Physician Treatment Plan  for Secondary Diagnosis: Principal Problem:   GAD (generalized anxiety disorder) Active Problems:   Chronic prescription benzodiazepine use  Long Term Goal(s):     Short Term Goals: Ability to identify changes in lifestyle to reduce recurrence of condition will improve Ability to verbalize feelings will improve Ability to disclose and discuss suicidal ideas Ability to demonstrate self-control will improve Ability to identify and develop effective coping behaviors will improve Ability to maintain clinical measurements within normal limits will improve Compliance with prescribed medications will improve Ability to identify triggers associated with substance abuse/mental health issues will improve     Medication Management: Evaluate patient's response, side effects, and tolerance of medication regimen.  Therapeutic Interventions: 1 to 1 sessions, Unit Group sessions and Medication administration.  Evaluation of Outcomes: Not Progressing   RN Treatment Plan for Primary Diagnosis: GAD (generalized anxiety disorder) Long Term Goal(s): Knowledge of disease and therapeutic regimen to maintain health will improve  Short Term Goals: Ability to remain free from injury will improve, Ability to verbalize frustration and anger appropriately will improve, Ability to demonstrate self-control, Ability to participate in decision making will improve, Ability to verbalize feelings will improve, and Compliance with prescribed medications will improve  Medication Management: RN will administer medications as ordered by provider, will assess and evaluate patient's response and provide education to patient for prescribed medication. RN will report any adverse and/or side effects to prescribing provider.  Therapeutic Interventions: 1 on 1 counseling sessions, Psychoeducation, Medication administration, Evaluate responses to treatment, Monitor vital signs and CBGs as ordered, Perform/monitor CIWA, COWS, AIMS and  Fall Risk screenings as ordered, Perform wound care treatments as ordered.  Evaluation of Outcomes: Not Progressing   LCSW Treatment Plan for Primary Diagnosis: GAD (generalized anxiety disorder) Long Term Goal(s): Safe transition to appropriate next level of care at discharge, Engage patient in therapeutic group addressing interpersonal concerns.  Short Term Goals: Engage patient in aftercare planning with referrals and resources, Increase social support, Increase ability to appropriately verbalize feelings, Increase emotional regulation, Facilitate acceptance of mental health diagnosis and concerns, and Increase skills for wellness and recovery  Therapeutic Interventions: Assess for all discharge needs, 1 to 1 time with Social worker, Explore available resources and support systems, Assess for adequacy in community support network, Educate family and significant other(s) on suicide prevention, Complete Psychosocial Assessment, Interpersonal group therapy.  Evaluation of Outcomes: Not Progressing   Progress in Treatment: Attending groups: No. Participating in groups: No. Taking medication as prescribed: Yes. Toleration medication: Yes. Family/Significant other contact made: No, will contact:  Zachary [name] (sister) [phone] Patient understands diagnosis: Yes. Discussing patient identified problems/goals with staff: Yes. Medical problems stabilized or resolved: Yes. Denies suicidal/homicidal ideation: Yes. Issues/concerns per patient self-inventory: No.  Patient Goals:  to work through my recent breakup and life changes.  Discharge Plan or Barriers: Patient likely to discharge to family once stable.   Reason  for Continuation of Hospitalization: Depression Medication stabilization  Estimated Length of Stay: 5-7 days  Last 3 [place] Suicide Severity Risk Score: Flowsheet Row [facility] (Current) [facility] from 12/08/2023  in [facility] ED from 12/03/2023 in [facility] Department at [facility]  C-SSRS RISK CATEGORY Moderate Risk High Risk No Risk    Last PHQ 2/9 Scores:    09/09/2021   11:01 AM 09/06/2021    2:23 PM  Depression screen PHQ 2/9  Decreased Interest 0 3  Down, Depressed, Hopeless 3 3  PHQ - 2 Score 3 6  Altered sleeping 0 3  Tired, decreased energy 1 3  Change in appetite 0 3  Feeling bad or failure about yourself  0 3  Trouble concentrating 0 3  Moving slowly or fidgety/restless 0 3  Suicidal thoughts 0 3  PHQ-9 Score 4 27  Difficult doing work/chores Extremely dIfficult Extremely dIfficult    Scribe for Treatment Team: Shawnice Tilmon M Jordell Outten, LCSWA 12/11/2023 4:15 PM

## 2023-12-11 NOTE — Group Note (Unsigned)
 Date:  12/12/2023 Time:  4:42 AM  Group Topic/Focus:  Wrap-Up Group:   The focus of this group is to help patients review their daily goal of treatment and discuss progress on daily workbooks. Alcoholics Anonymous (AA) Meeting   Participation Level:  Active  Participation Quality:  Appropriate  Affect:  Appropriate  Cognitive:  Appropriate  Insight: Appropriate  Engagement in Group:  Engaged  Modes of Intervention:  Discussion, Socialization, and Support  Additional Comments:  Patient attended  [employer] 12/12/2023, 4:42 AM

## 2023-12-11 NOTE — Plan of Care (Signed)

## 2023-12-12 MED ORDER — CLONAZEPAM 0.5 MG PO TABS
1.0000 mg | ORAL_TABLET | Freq: Three times a day (TID) | ORAL | Status: DC
  Administered 2023-12-12 – 2023-12-13 (×3): 1 mg via ORAL
  Filled 2023-12-12 (×3): qty 2

## 2023-12-12 NOTE — BHH Counselor (Signed)
 CSW attempted to contact patient's sister for completion of SPE, however, she did not answer. Left confidential voicemail asking for a return call.  Cydnee Fuquay, LCSWA 12/12/23 3:26PM

## 2023-12-12 NOTE — Progress Notes (Signed)
   12/12/23 0900  Psych Admission Type (Psych Patients Only)  Admission Status Voluntary  Psychosocial Assessment  Patient Complaints None  Eye Contact Fair  Facial Expression Animated  Affect Anxious  Speech Logical/coherent  Interaction Assertive  Motor Activity Slow  Appearance/Hygiene Unremarkable  Behavior Characteristics Cooperative;Anxious  Mood Anxious;Pleasant  Thought Process  Coherency WDL  Content WDL  Delusions None reported or observed  Perception WDL  Hallucination None reported or observed  Judgment Poor  Confusion None  Danger to Self  Current suicidal ideation? Denies  Self-Injurious Behavior No self-injurious ideation or behavior indicators observed or expressed   Agreement Not to Harm Self Yes  Description of Agreement verbal  Danger to Others  Danger to Others None reported or observed

## 2023-12-12 NOTE — Progress Notes (Signed)
(  Sleep Hours) - 6 (Any PRNs that were needed, meds refused, or side effects to meds)- tylenol , vistaril  (Any disturbances and when (visitation, over night)- n/a (Concerns raised by the patient)- none (SI/HI/AVH)- denies

## 2023-12-12 NOTE — Plan of Care (Signed)
  Problem: Education: Goal: Emotional status will improve Outcome: Progressing Goal: Verbalization of understanding the information provided will improve Outcome: Progressing   Problem: Activity: Goal: Interest or engagement in activities will improve Outcome: Progressing Goal: Sleeping patterns will improve Outcome: Progressing

## 2023-12-12 NOTE — Group Note (Signed)
 Date:  12/12/2023 Time:  9:16 AM  Group Topic/Focus:  Goals Group:   The focus of this group is to help patients establish daily goals to achieve during treatment and discuss how the patient can incorporate goal setting into their daily lives to aide in recovery.    Participation Level:  Active  Participation Quality:  Appropriate  Affect:  Appropriate  Cognitive:  Appropriate  Insight: Appropriate  Engagement in Group:  Engaged  Modes of Intervention:  Discussion  Additional Comments:    Willadeen Colantuono R Mearl Olver 12/12/2023, 9:16 AM

## 2023-12-12 NOTE — Plan of Care (Signed)
   Problem: Education: Goal: Emotional status will improve Outcome: Progressing Goal: Mental status will improve Outcome: Progressing Goal: Verbalization of understanding the information provided will improve Outcome: Progressing   Problem: Activity: Goal: Interest or engagement in activities will improve Outcome: Progressing

## 2023-12-12 NOTE — Progress Notes (Signed)
 [facility] MD Progress Note  12/12/2023 10:14 PM Zachary [name]  MRN:  [id number]  Principal Problem: GAD (generalized anxiety disorder) Diagnosis: Principal Problem:   GAD (generalized anxiety disorder) Active Problems:   Chronic prescription benzodiazepine use  Reason for Admission:  Zachary [name] is a 63 y.o., male with a past psychiatric history significant for MDD, GAD and alcohol use disorder who presents to the [facility] Voluntary from [facility] Emergency Department for evaluation and management of SI with plan to slit his wrist.     24-hour Chart Review: Chart reviewed. Patient's case discussed in interdisciplinary team meeting. Blood pressure elevated this morning (within normal range on recheck), Pulse rate 50-53 this morning. As needed hydroxyzine  required overnight.  He slept a documented 6.0 hours.  He is adherent to taking psychotropic medication regimen. CIWA Score = 0.  Nursing notes indicate patient concerns or behavioral challenges on the unit.     Daily Evaluation: Zachary [name] reports that his mood is euthymic, improved since admission, and stable. Denies feeling down, depressed, or sad. Reports that anxiety symptoms are at manageable level. Sleep and appetite reported as stable. Concentration is without complaint. Reports energy level is adequate. Denies having any suicidal thoughts. Denies having any suicidal intent and plan. Denies having any homicidal ideation. Denies having psychotic symptoms. Denies having side effects to current psychiatric medications.   Zachary [name] reports that he will be returning home, where he resides with his son. He states that his dogs provide him comfort and that he is currently self-employed, with plans to apply for maintenance work. He reports that his clientele tends to decrease as the fall season approaches. He shares that he feels encouraged after speaking with his ex-girlfriend this morning. He denies withdrawal symptoms or cravings  and reports that, prior to this relapse, he had maintained two years of sobriety. He states that his medications have improved his mood. He also reports that at home he typically takes Klonopin  on a regimen of 6 AM, 12 PM, and 6 PM. 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 He reports missing services this past Saturday due to his hospitalization.  Discussed discharge planning and emphasized medication adherence and attendance at follow-up appointments to prevent symptom exacerbation.   We discussed psychosocial stressors, including recent loss of relationship and employment difficulties  Discussed with the patient the critical importance of complete cessation of alcohol and other substances to support recovery, highlighting that abstaining can lead to improve mood stability, enhance cognitive function, better sleep and lower anxiety    Zachary [name] mood appears stable with no evidence of psychosis, mania, withdrawal, or cravings. He presents as future-oriented and demonstrates motivation to engage in meaningful personal, occupational, and spiritual activities. He is tolerating his current medications well. Case reviewed with the attending psychiatrist. Continue current psychiatric medications with good tolerance. Modify Klonopin  administration times to match his home regimen of 6 AM, 12 PM, and 6 PM. Anticipated discharge Wednesday, October 1, to allow patient to attend and observe [employer] services. Encourage continued sobriety and outpatient follow-up for relapse prevention and medication management.   Past Psychiatric History: Current medications: Klonopin  since 2020 (mainly filled by Dr. Tillman, most recent fill was 9/25 one day supply and previously was filling monthly appropriately), Seroquel  Current psychiatrist: Telemed through [facility]- once every 4 months Current therapist: Telemed  through [facility]   Previous psychiatric diagnoses:  MDD, GAD Prior medications: Cymbalta  (feels it was ineffective), Paxil (ineffective), Zoloft  (tried it), Lexapro  (does not recall working at all), Prozac   Psychiatric medication compliance history: poor   Previous hospitalizations most recently admitted to [facility] in 2023 due to increased depression and passive SI after OD on Klonopin ; Has been a patient at the [facility], [facility], [facility], [facility].  He has been treated with multiple medications in the past, but was never compliant.   History of suicide attempts: yes, most recently 2.5 years ago (slight his wrist) History of self harm: other than the suicide attempt, denies   Substance Use History (onset/amount/frequency/most recent use date): Alcohol: drank once after 2 years of sobriety- 8-10 beers and  Hx withdrawal tremors/shakes: denies Hx alcohol related blackouts denies Hx alcohol induced hallucinations: denies Hx alcoholic seizures: denies DUI: denies   Tobacco: 2 cigars daily Marijuana: weekly, CBD pipe Cocaine: denies Methamphetamines: denies Opiates: denies Benzodiazepines: using prescribed Klonopin  Ecstasy: denies   History of Detox / Rehab: yes, plenty   Substance Abuse History in the last 12 months: Yes   Past Medical/Surgical History:  Medical Diagnoses: COPD Home Rx: Doxycycline  for lung infection Prior Hospitalization: none recently Prior Surgeries: denies   Head trauma: yes multiple Seizures: denies   Allergies: statin (palpitations) and penicillin (unsure of reaction)   Family History:  Alcoholism: Mother, brother & father.    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 Weapons: denies  Current Medications: Current Facility-Administered Medications  Medication Dose Route Frequency Provider  Last Rate Last Admin   acetaminophen  (TYLENOL ) tablet 650 mg  650 mg Oral Q6H PRN Randall Starlyn HERO, NP   650 mg at 12/12/23 0136   albuterol  (VENTOLIN  HFA) 108 (90 Base) MCG/ACT inhaler 2 puff  2 puff Inhalation Q4H PRN Byungura, Veronique M, NP       alum & mag hydroxide-simeth (MAALOX/MYLANTA) 200-200-20 MG/5ML suspension 30 mL  30 mL Oral Q4H PRN Randall, Veronique M, NP       chlordiazePOXIDE  (LIBRIUM ) capsule 25 mg  25 mg Oral Q6H PRN Hoang, Daniela B, MD       clonazePAM  (KLONOPIN ) tablet 1 mg  1 mg Oral TID Morenike Cuff H, NP   1 mg at 12/12/23 1719   doxycycline  (VIBRA -TABS) tablet 100 mg  100 mg Oral BID Byungura, Veronique M, NP   100 mg at 12/12/23 1719   hydrOXYzine  (ATARAX ) tablet 25 mg  25 mg Oral Q8H PRN Hoang, Daniela B, MD   25 mg at 12/12/23 2110   loperamide  (IMODIUM ) capsule 2-4 mg  2-4 mg Oral PRN Hoang, Daniela B, MD       magnesium  hydroxide (MILK OF MAGNESIA) suspension 30 mL  30 mL Oral Daily PRN Randall, Veronique M, NP       mirtazapine (REMERON) tablet 7.5 mg  7.5 mg Oral QHS Hoang, Daniela B, MD   7.5 mg at 12/12/23 2110   multivitamin with minerals tablet 1 tablet  1 tablet Oral QPC breakfast Prentis Kitchens A, DO   1 tablet at 12/12/23 1158   OLANZapine (ZYPREXA) injection 5 mg  5 mg Intramuscular TID PRN Byungura, Veronique M, NP       OLANZapine zydis (ZYPREXA) disintegrating tablet 5 mg  5 mg Oral TID PRN Randall Starlyn HERO, NP   5 mg at 12/09/23 2117   ondansetron  (ZOFRAN -ODT) disintegrating tablet 4 mg  4 mg Oral Q6H PRN Hoang, Daniela B, MD       thiamine  (Vitamin B-1) tablet 100 mg  100 mg Oral Daily Hoang, Daniela B, MD   100 mg at 12/12/23 9253    Lab Results:  Results for orders placed or performed during the hospital encounter of 12/09/23 (from the past 48 hours)  Comprehensive metabolic panel with GFR     Status: Abnormal   Collection Time: 12/11/23  6:18 PM  Result Value Ref Range   Sodium 143 135 - 145 mmol/L   Potassium 4.0 3.5 - 5.1  mmol/L   Chloride 109 98 - 111 mmol/L   CO2 25 22 - 32 mmol/L   Glucose, Bld 121 (H) 70 - 99 mg/dL    Comment: Glucose reference range applies only to samples taken after fasting for at least 8 hours.   BUN 16 8 - 23 mg/dL   Creatinine, Ser 9.16 0.61 - 1.24 mg/dL   Calcium 9.2 8.9 - 89.6 mg/dL   Total Protein 5.8 (L) 6.5 - 8.1 g/dL   Albumin 4.1 3.5 - 5.0 g/dL   AST 17 15 - 41 U/L   ALT 20 0 - 44 U/L   Alkaline Phosphatase 82 38 - 126 U/L   Total Bilirubin 0.2 0.0 - 1.2 mg/dL   GFR, Estimated >39 >39 mL/min    Comment: (NOTE) Calculated using the CKD-EPI Creatinine Equation (2021)    Anion gap 9 5 - 15    Comment: Performed at [facility], [address], [place], [place] [address]     Blood Alcohol level:  Lab Results  Component Value Date   ETH 84 (H) 12/08/2023   ETH <10 02/09/2022    Metabolic Disorder Labs: Lab Results  Component Value Date   HGBA1C 5.1 02/11/2022   MPG 100 02/11/2022   MPG 93.93 07/22/2021   No results found for: PROLACTIN Lab Results  Component Value Date   CHOL 235 (H) 02/11/2022   TRIG 89 02/11/2022   HDL 54 02/11/2022   CHOLHDL 4.4 02/11/2022   VLDL 18 02/11/2022   LDLCALC 163 (H) 02/11/2022   LDLCALC 94 07/22/2021    Physical Findings: AIMS:  , ,  ,  ,    CIWA:  CIWA-Ar Total: 0 COWS:     Musculoskeletal: Strength & Muscle Tone: within normal limits Gait & Station: normal Patient leans: N/A  Psychiatric Status Exam  Presentation  General Appearance:  Appropriate for Environment; Fairly Groomed   Eye Contact: Good   Speech: Clear and Coherent; Normal Rate   Speech Volume: Normal   Handedness: Right   Mood and Affect  Mood: Euthymic; Anxious   Affect: Congruent; Appropriate; Full Range    Thought Process  Thought Processes: Coherent; Goal Directed; Linear   Duration of Psychotic Symptoms: na Past Diagnosis of Schizophrenia or Psychoactive disorder: no Descriptions of  Associations: Intact   Orientation: Full (Time, Place and Person)   Thought Content: Logical   Hallucinations: No data recorded   Ideas of Reference: None   Suicidal Thoughts: No data recorded   Homicidal Thoughts: No data recorded    Sensorium  Memory: Immediate Good; Recent Good; Remote Good   Judgment: Fair   Insight: Fair    Art therapist  Concentration: Good   Attention Span: Good   Recall: Good   Fund of Knowledge: Fair   Language: Fair    Psychomotor Activity  Psychomotor Activity: No data recorded    Assets  Assets:  Communication Skills; Desire for Improvement; Resilience     Physical Exam Vitals and nursing note reviewed.  Constitutional:      Appearance: Normal appearance.  HENT:     Mouth/Throat:     Pharynx: Oropharynx is clear.  Cardiovascular:     Rate and Rhythm: Normal rate.  Pulmonary:     Effort: No respiratory distress.  Skin:    General: Skin is dry.  Neurological:     General: No focal deficit present.     Mental Status: He is alert and oriented to person, place, and time.    Review of Systems  Constitutional: Negative.   Respiratory: Negative.    Cardiovascular: Negative.   Gastrointestinal: Negative.   Genitourinary: Negative.   Skin: Negative.   Neurological: Negative.      Diagnoses / Active Problems: Principal Problem: GAD (generalized anxiety disorder) Diagnosis: Principal Problem:   GAD (generalized anxiety disorder) Active Problems:   Chronic prescription benzodiazepine use    PLAN: Safety and Monitoring:             -- Voluntary admission to inpatient psychiatric unit for safety, stabilization and treatment             -- Daily contact with patient to assess and evaluate symptoms and progress in treatment             -- Patient's case to be discussed in multi-disciplinary team meeting             -- Observation Level : q15 minute checks             -- Vital  signs:  q12 hours             -- Precautions: suicide, elopement, and assault   2. Medications:               Psychiatric Diagnosis and Treatment Chronic benzodiazepine use GAD with panic attacks - Continue Klonopin  1 mg TID (6 AM, 12 PM, and 6 PM to match his home regimen)              - PDMP checked, getting Klonopin  refilled since 2023 - Continue Remeron 7.5 mg nightly  - Atarax  25 mg TID as needed for anxiety - Agitation Protocol: Zyprexa   Alcohol Use - recent relapse Drank 10 beers on 9/26, was sober for 2 years prior -CIWA -Multivitamin -Thiamine  100 mg daily -PRN Librium  -PRN Immodium  Medical Diagnosis and Treatment COPD exacerbation- continue home doxycycline  for 9 more doses Hypokalemia 3.3 - 20 meq x2 - CMP pending Patient does not need nicotine  replacement   Other as needed medications  Tylenol  650 mg every 6 hours as needed for pain Mylanta 30 mL every 4 hours as needed for indigestion Milk of magnesia 30 mL daily as needed for constipation     The risks/benefits/side-effects/alternatives to the above medication were discussed in detail with the patient and time was given for questions. The patient consents to medication trial. FDA black box warnings, if present, were discussed.   The patient is agreeable with the medication plan, as above. We will monitor the patient's response to pharmacologic treatment, and adjust medications as necessary.   3. Routine and other pertinent labs: EKG monitoring: QTc: 374   Metabolism / endocrine: BMI: Body mass index is 21.74 kg/m.     CBC: unremarkable CMP: K 3.3 UDS: positive THC Ethanol: 84   4. Group Therapy:             --  Encouraged patient to participate in unit milieu and in scheduled group therapies              -- Short Term Goals: Ability to identify changes in lifestyle to reduce recurrence of condition will improve, Ability to verbalize feelings will improve, Ability to disclose and discuss suicidal  ideas, Ability to demonstrate self-control will improve, Ability to identify and develop effective coping behaviors will improve, Ability to maintain clinical measurements within normal limits will improve, Compliance with prescribed medications will improve, and Ability to identify triggers associated with substance abuse/mental health issues will improve             -- Long Term Goals: Improvement in symptoms so as ready for discharge -- Patient is encouraged to participate in group therapy while admitted to the psychiatric unit. -- We will address other chronic and acute stressors, which contributed to the patient's GAD (generalized anxiety disorder) in order to reduce the risk of self-harm at discharge.   5. Discharge Planning:              -- Social work and case management to assist with discharge planning and identification of hospital follow-up needs prior to discharge             -- Estimated LOS: 5-7 days             -- Discharge Concerns: Need to establish a safety plan; Medication compliance and effectiveness             -- Discharge Goals: Return home with outpatient referrals for mental health follow-up including medication management/psychotherapy   I certify that inpatient services furnished can reasonably be expected to improve the patient's condition.      Zachary Chiquita Hint, NP 12/12/2023, 10:14 PM Patient ID: TRAVIAN KERNER, male   DOB: 04-01-1960, 63 y.o.   MRN: [id number] Patient ID: RAWLEY HARJU, male   DOB: May 30, 1960, 63 y.o.   MRN: [id number]

## 2023-12-12 NOTE — Group Note (Signed)
 Date:  12/12/2023 Time:  8:56 PM  Group Topic/Focus:  Wrap-Up Group:   The focus of this group is to help patients review their daily goal of treatment and discuss progress on daily workbooks.    Participation Level:  Active  Participation Quality:  Appropriate and Sharing  Affect:  Appropriate  Cognitive:  Appropriate  Insight: Appropriate  Engagement in Group:  Engaged  Modes of Intervention:  Activity and Socialization  Additional Comments:  Patient activity engaged in wrap up group and shared. Patient shared with group, something good that happened today, something they are looking for to, and a challenge they faced today. Patient also participated in ice breaker group activity after sharing.   Eward Mace 12/12/2023, 8:56 PM

## 2023-12-12 NOTE — Group Note (Signed)
 LCSW Group Therapy Note   Group Date: 12/12/2023 Start Time: 1100 End Time: 1200   Participation:  patient was present and actively participated in the discussion.  Patient was insightful.  Type of Therapy:  Group Therapy  Topic:  Healing of the Hearts:  A Safe Space for Grief   Objective:  The objective of this class, Healing Hearts: A Safe Space for Grief, is to create a compassionate environment where participants can process their grief, explore different stages of grief, and discover ways to honor their loved ones through personal rituals.  Goals: Provide a safe and supportive space where participants feel comfortable sharing their feelings and experiences of grief without judgment. Educate participants about the stages of grief and emphasize that there is no right way to grieve or a fixed timeline for healing. Introduce the concept of rituals as a means to process grief, allowing individuals to honor their loved ones in a personal and meaningful way.  Summary:  In Healing Hearts: A Safe Space for Grief, we explored the unique and personal journey of grief, emphasizing that everyone experiences it differently. We discussed the five stages of grief (denial, anger, bargaining, depression, and acceptance), with the understanding that grief is not linear. Rituals were introduced as a way to help cope with loss, offering comfort and connection through meaningful actions such as lighting candles or taking memory walks. Participants were encouraged to express their emotions, focus on self-care, and reflect on moments of gratitude for their loved ones, recognizing that healing is a process and there is no timeline for grief.  Therapeutic Modalities: - Elements of CBT:  Cognitive Restructuring - Elements of DBT:  Distress Tolerance, Emotion Regulation   Berlene Dixson O Emerlyn Mehlhoff, LCSWA 12/12/2023  12:21 PM

## 2023-12-13 DIAGNOSIS — F1390 Sedative, hypnotic, or anxiolytic use, unspecified, uncomplicated: Secondary | ICD-10-CM

## 2023-12-13 DIAGNOSIS — F411 Generalized anxiety disorder: Principal | ICD-10-CM

## 2023-12-13 MED ORDER — HYDROXYZINE HCL 25 MG PO TABS: 25.0000 mg | ORAL_TABLET | Freq: Three times a day (TID) | ORAL | 0 refills | Status: AC | PRN

## 2023-12-13 MED ORDER — MIRTAZAPINE 7.5 MG PO TABS: 7.5000 mg | ORAL_TABLET | Freq: Every day | ORAL | 0 refills | Status: AC

## 2023-12-13 NOTE — Progress Notes (Signed)
 Discharge Note:  Patient denies SI/HI/AVH at this time. Discharge instructions, AVS, prescriptions, and transition record gone over with patient. Patient agrees to comply with medication management, follow-up visit, and outpatient therapy. Patient belongings returned to patient. Patient questions and concerns addressed and answered. Patient ambulatory off unit. Patient discharged to lobby to await taxi to home.

## 2023-12-13 NOTE — Group Note (Signed)
 Date:  12/13/2023 Time:  11:16 AM  Group Topic/Focus:  Goals Group:   The focus of this group is to help patients establish daily goals to achieve during treatment and discuss how the patient can incorporate goal setting into their daily lives to aide in recovery.    Participation Level:  Active  Participation Quality:  Attentive  Affect:  Appropriate  Cognitive:  Alert  Insight: Good  Engagement in Group:  Engaged  Modes of Intervention:  Education  Additional Comments: Zachary [name], Is ready to go home and is thankful for the treatment that he received while he has been here at [facility].  [name] 12/13/2023, 11:16 AM

## 2023-12-13 NOTE — Group Note (Unsigned)
 Date:  12/13/2023 Time:  10:28 AM  Group Topic/Focus:  Goals Group:   The focus of this group is to help patients establish daily goals to achieve during treatment and discuss how the patient can incorporate goal setting into their daily lives to aide in recovery.     Participation Level:  {BHH PARTICIPATION OZCZO:77735}  Participation Quality:  {BHH PARTICIPATION QUALITY:22265}  Affect:  {BHH AFFECT:22266}  Cognitive:  {BHH COGNITIVE:22267}  Insight: {BHH Insight2:20797}  Engagement in Group:  {BHH ENGAGEMENT IN HMNLE:77731}  Modes of Intervention:  {BHH MODES OF INTERVENTION:22269}  Additional Comments:  ***  [name] 12/13/2023, 10:28 AM

## 2023-12-13 NOTE — BHH Counselor (Signed)
 CSW received a voicemail from patient's sister, Candis Lighter [phone], who was returning my phone call from yesterday. CSW attempted to call her back this morning but number went to voicemail. Will complete SPE with patient prior to discharge.  Fumiko Cham, LCSWA 12/13/23 8:49AM

## 2023-12-13 NOTE — Transportation (Signed)
 12/13/2023  Pas[place] FORBES Art DOB: 01/02/1961 MRN: [id number]   RIDER WAIVER AND RELEASE OF LIABILITY  For the purposes of helping with transportation needs, [facility] partners with outside transportation providers (taxi companies, [place], Catering manager.) to give [facility] patients or other approved people the choice of on-demand rides Public librarian) to our buildings for non-emergency visits.  By using [employer], I, the person signing this document, on behalf of myself and/or any legal minors (in my care using the [employer]), agree:  Science writer given to me are supplied by independent, outside transportation providers who do not work for, or have any affiliation with, [employer]. [employer] is not a transportation company. [facility] has no control over the quality or safety of the rides I get using [employer]. [facility] has no control over whether any outside ride will happen on time or not. [facility] gives no guarantee on the reliability, quality, safety, or availability on any rides, or that no mistakes will happen. I know and accept that traveling by vehicle (car, truck, SVU, fleeta, bus, taxi, etc.) has risks of serious injuries such as disability, being paralyzed, and death. I know and agree the risk of using [employer] is mine alone, and not [employer]. [employer] are provided as is and as are available. The transportation providers are in charge for all inspections and care of the vehicles used to provide these rides. I agree not to take legal action against [facility], its agents, employees, officers, directors, representatives, insurers, attorneys, assigns, successors, subsidiaries, and affiliates at any time for any reasons related directly or indirectly to using [employer]. I also agree not to take legal action against [facility] or its affiliates for any injury, death, or damage to property caused by or related to using  [employer]. I have read this Waiver and Release of Liability, and I understand the terms used in it and their legal meaning. This Waiver is freely and voluntarily given with the understanding that my right (or any legal minors) to legal action against [facility] relating to [employer] is knowingly given up to use these services.   I attest that I read the Ride Waiver and Release of Liability to [place] FORBES Art, gave Mr. Vandenberghe the opportunity to ask questions and answered the questions asked (if any). I affirm that [place] FORBES Art then provided consent for assistance with transportation.       Gerren Hoffmeier, LCSWA 12/13/23 10:28am

## 2023-12-13 NOTE — Progress Notes (Signed)
(  Sleep Hours) - 7.25 (Any PRNs that were needed, meds refused, or side effects to meds)- PRN vistaril  25 mg given at pt request, no meds refused.  (Any disturbances and when (visitation, over night)- None  (Concerns raised by the patient)- None  (SI/HI/AVH)- Denies SI/HI/AVH

## 2023-12-13 NOTE — Discharge Summary (Signed)
 Physician Discharge Summary Note  Patient:  Zachary [name] is an 63 y.o., male MRN:  [id number] DOB:  1960-11-06 Patient phone:  [phone] (home)  Patient address:   [address] [place] [address],  Total Time spent with patient: 30 minutes  Date of Admission:  12/09/2023 Date of Discharge: 12/13/23   Reason for Admission:  Zachary [name] is a 61 y.o., male with a past psychiatric history significant for MDD, GAD and alcohol use disorder who presents to the [facility] from [facility] Emergency Department for evaluation and management of SI with plan to slit his wrist.    Zachary [name] is planned for discharge home where he resides with his son.  He is established with medication management and therapy services through [facility]. He is aware of follow-up plans and demonstrates insight into the importance of ongoing treatment and abstaining from alcohol and psychoactive substances. Zachary [name] is future oriented and motivated toward recovery. No current safety concerns or acute psychiatric symptoms observed. No withdrawal symptoms or cravings reported.    Principal Problem: GAD (generalized anxiety disorder) Discharge Diagnoses: Principal Problem:   GAD (generalized anxiety disorder) Active Problems:   Chronic prescription benzodiazepine use   Past Psychiatric History: See H&P   Past Medical History:  Past Medical History:  Diagnosis Date   Alcohol abuse    Anxiety    COPD (chronic obstructive pulmonary disease) (HCC)    Depression    Hypertension    Suicidal ideation     Past Surgical History:  Procedure Laterality Date   COLONOSCOPY     MOUTH SURGERY     Family History:  Family History  Problem Relation Age of Onset   Heart attack Paternal Grandfather    Stroke Mother    Heart failure Mother    Heart failure Father    Family Psychiatric  History: See H&P  Social History:  Social History   Substance and Sexual Activity  Alcohol Use  Yes   Comment: 0.84 BAC in ED     Social History   Substance and Sexual Activity  Drug Use Yes   Types: Marijuana   Comment: UDS was negative    Social History   Socioeconomic History   Marital status: Divorced    Spouse name: Not on file   Number of children: 2   Years of education: Not on file   Highest education level: Not on file  Occupational History   Occupation: Product/process development scientist  Tobacco Use   Smoking status: Former    Current packs/day: 2.00    Types: Cigarettes   Smokeless tobacco: Never  Vaping Use   Vaping status: Never Used  Substance and Sexual Activity   Alcohol use: Yes    Comment: 0.84 BAC in ED   Drug use: Yes    Types: Marijuana    Comment: UDS was negative   Sexual activity: Not Currently  Other Topics Concern   Not on file  Social History Narrative   Pt stated that he lives alone; works as a Product/process development scientist   Social Drivers of Corporate investment banker Strain: Not on file  Food Insecurity: No Food Insecurity (12/09/2023)   Hunger Vital Sign    Worried About Running Out of Food in the Last Year: Never true    Ran Out of Food in the Last Year: Never true  Transportation Needs: No Transportation Needs (12/09/2023)   PRAPARE - Administrator, Civil Service (Medical): No  Lack of Transportation (Non-Medical): No  Physical Activity: Not on file  Stress: Not on file  Social Connections: Socially Isolated (12/09/2023)   Social Connection and Isolation Panel    Frequency of Communication with Friends and Family: Twice a week    Frequency of Social Gatherings with Friends and Family: Never    Attends Religious Services: More than 4 times per year    Active Member of [employer] or Organizations: No    Attends Banker Meetings: Never    Marital Status: Divorced    Hospital Course:  During the patient's hospitalization, patient had extensive initial psychiatric evaluation, and follow-up psychiatric evaluations every  day.  Psychiatric diagnoses provided upon initial assessment: GAD (generalized anxiety disorder)  Patient's psychiatric medications were adjusted on admission: The patient's home regimen of Klonopin  1 mg three times daily was continued.  Remeron was initiated at 7.5 mg nightly to address sleep disturbances and appetite concerns.  During the hospitalization, no other adjustments were made to the patient's psychiatric medication regimen, except for modifying the Klonopin  dosing schedule to 6 AM, 12 PM, and 6 PM to align with the patient's home regimen.  Patient's care was discussed during the interdisciplinary team meeting every day during the hospitalization.  The patient denies having side effects to prescribed psychiatric medication.  Gradually, patient started adjusting to milieu. The patient was evaluated each day by a clinical provider to ascertain response to treatment. Improvement was noted by the patient's report of decreasing symptoms, improved sleep and appetite, affect, medication tolerance, behavior, and participation in unit programming.  Patient was asked each day to complete a self inventory noting mood, mental status, pain, new symptoms, anxiety and concerns.    Symptoms were reported as significantly decreased or resolved completely by discharge.   On day of discharge, the patient reports that their mood is stable. The patient denied having suicidal thoughts for more than 48 hours prior to discharge.  Patient denies having homicidal thoughts.  Patient denies having auditory hallucinations.  Patient denies any visual hallucinations or other symptoms of psychosis. The patient was motivated to continue taking medication with a goal of continued improvement in mental health.   The patient reports their target psychiatric symptoms of worsening anxiety, depression and insomnia responded well to the psychiatric medications, and the patient reports overall benefit other psychiatric  hospitalization. Supportive psychotherapy was provided to the patient. The patient also participated in regular group therapy while hospitalized. Coping skills, problem solving as well as relaxation therapies were also part of the unit programming.  Labs were reviewed with the patient, and abnormal results were discussed with the patient.  The patient is able to verbalize their individual safety plan to this provider.  # It is recommended to the patient to continue psychiatric medications as prescribed, after discharge from the hospital.    # It is recommended to the patient to follow up with your outpatient psychiatric provider and PCP.  # It was discussed with the patient, the impact of alcohol, drugs, tobacco have been there overall psychiatric and medical wellbeing, and total abstinence from substance use was recommended the patient.ed.  # Prescriptions provided or sent directly to preferred pharmacy at discharge. Patient agreeable to plan. Given opportunity to ask questions. Appears to feel comfortable with discharge.    # In the event of worsening symptoms, the patient is instructed to call the crisis hotline, 911 and or go to the nearest ED for appropriate evaluation and treatment of symptoms. To follow-up with  primary care provider for other medical issues, concerns and or health care needs  # Patient was discharged home with a plan to follow up as noted below.   Physical Findings: AIMS:  , ,  N/A,  ,  ,  ,   CIWA:  CIWA-Ar Total: 0 COWS:   N/A  Musculoskeletal: Strength & Muscle Tone: within normal limits Gait & Station: normal Patient leans: N/A   Psychiatric Specialty Exam:  Presentation  General Appearance:  Appropriate for Environment  Eye Contact: Good  Speech: Clear and Coherent; Normal Rate  Speech Volume: Normal  Handedness: Right   Mood and Affect  Mood: Euthymic  Affect: Appropriate; Full Range   Thought Process  Thought  Processes: Coherent; Linear  Descriptions of Associations:Intact  Orientation:Full (Time, Place and Person)  Thought Content:WDL  History of Schizophrenia/Schizoaffective disorder:No data recorded Duration of Psychotic Symptoms:No data recorded Hallucinations:Hallucinations: None  Ideas of Reference:None  Suicidal Thoughts:Suicidal Thoughts: No  Homicidal Thoughts:Homicidal Thoughts: No   Sensorium  Memory: Immediate Good; Recent Good; Remote Good  Judgment: Intact  Insight: Present   Executive Functions  Concentration: Good  Attention Span: Good  Recall: Good  Fund of Knowledge: Good  Language: Good   Psychomotor Activity  Psychomotor Activity:Psychomotor Activity: Normal   Assets  Assets: Communication Skills; Desire for Improvement; Housing; Resilience; Talents/Skills   Sleep  Sleep:Sleep: Good  Estimated Sleeping Duration (Last 24 Hours): 7.50 hours   Physical Exam: Physical Exam Vitals and nursing note reviewed.  Constitutional:      General: He is not in acute distress.    Appearance: He is not ill-appearing.  HENT:     Mouth/Throat:     Pharynx: Oropharynx is clear.  Cardiovascular:     Pulses: Normal pulses.  Pulmonary:     Effort: No respiratory distress.  Skin:    General: Skin is dry.  Neurological:     General: No focal deficit present.     Mental Status: He is alert and oriented to person, place, and time.  Psychiatric:        Mood and Affect: Mood normal.        Behavior: Behavior normal.        Thought Content: Thought content normal.        Judgment: Judgment normal.    Review of Systems  Constitutional: Negative.   HENT: Negative.    Respiratory: Negative.    Cardiovascular: Negative.   Gastrointestinal: Negative.   Genitourinary: Negative.   Musculoskeletal: Negative.   Skin: Negative.   Neurological: Negative.    Blood pressure (!) 157/75, pulse (!) 56, temperature 97.6 F (36.4 C), temperature  source Oral, resp. rate 18, height 5' 9 (1.753 m), weight 66.8 kg, SpO2 99%. Body mass index is 21.74 kg/m.   Social History   Tobacco Use  Smoking Status Former   Current packs/day: 2.00   Types: Cigarettes  Smokeless Tobacco Never   Tobacco Cessation:  A prescription for an FDA-approved tobacco cessation medication was offered at discharge and the patient refused   Blood Alcohol level:  Lab Results  Component Value Date   ETH 84 (H) 12/08/2023   ETH <10 02/09/2022    Metabolic Disorder Labs:  Lab Results  Component Value Date   HGBA1C 5.1 02/11/2022   MPG 100 02/11/2022   MPG 93.93 07/22/2021   No results found for: PROLACTIN Lab Results  Component Value Date   CHOL 235 (H) 02/11/2022   TRIG 89 02/11/2022   HDL 54 02/11/2022  CHOLHDL 4.4 02/11/2022   VLDL 18 02/11/2022   LDLCALC 163 (H) 02/11/2022   LDLCALC 94 07/22/2021    See Psychiatric Specialty Exam and Suicide Risk Assessment completed by Attending Physician prior to discharge.  Discharge destination:  Home  Is patient on multiple antipsychotic therapies at discharge:  No   Has Patient had three or more failed trials of antipsychotic monotherapy by history:  No  Recommended Plan for Multiple Antipsychotic Therapies: NA  Discharge Instructions     Activity as tolerated - No restrictions   Complete by: As directed    Diet - low sodium heart healthy   Complete by: As directed       Allergies as of 12/13/2023       Reactions   Fish-derived Products    religious   Penicillins Other (See Comments)   Childhood allergy Reaction:  Unknown  Has patient had a PCN reaction causing immediate rash, facial/tongue/throat swelling, SOB or lightheadedness with hypotension: Unknown Has patient had a PCN reaction causing severe rash involving mucus membranes or skin necrosis: Unknown Has patient had a PCN reaction that required hospitalization Unknown Has patient had a PCN reaction occurring within the  last 10 years: No If all of the above answers are NO, then may proceed with Cephalosporin use.   Pork-derived Products    religious   Statins Palpitations        Medication List     STOP taking these medications    doxycycline  100 MG capsule Commonly known as: VIBRAMYCIN    predniSONE  50 MG tablet Commonly known as: DELTASONE    QUEtiapine  100 MG tablet Commonly known as: SEROQUEL        TAKE these medications      Indication  albuterol  108 (90 Base) MCG/ACT inhaler Commonly known as: VENTOLIN  HFA Inhale 2 puffs into the lungs every 4 (four) hours as needed for wheezing or shortness of breath.  Indication: Wheezing or shortness of breath   hydrOXYzine  25 MG tablet Commonly known as: ATARAX  Take 1 tablet (25 mg total) by mouth every 8 (eight) hours as needed for anxiety.  Indication: Feeling Anxious   KlonoPIN  1 MG tablet Generic drug: clonazePAM  Take 1 mg by mouth 3 (three) times daily.  Indication: Feeling Anxious   mirtazapine 7.5 MG tablet Commonly known as: REMERON Take 1 tablet (7.5 mg total) by mouth at bedtime.  Indication: Major Depressive Disorder, Appetite stimulation  Insomnia  Mood stability        Follow-up Information     [facility] Follow up.   Why: You may also go to this provider for an assessment, to obtain therapy and medication management services. They are open 24/7. Contact information: [address] [place] [address] [phone]         Services[facility] Go on 12/15/2023.   Why: Please go to this provider on 12/15/23 at 8:30 am for an assessment, to obtain therapy and medication management services. Contact information: [address] [place] [address] [phone]                 Follow-up recommendations:   Activity: as tolerated  Diet: heart healthy  Other: -Follow-up with your outpatient psychiatric provider -instructions on appointment date, time, and  address (location) are provided to you in discharge paperwork.  -Take your psychiatric medications as prescribed at discharge - instructions are provided to you in the discharge paperwork  -Follow-up with outpatient primary care doctor and other specialists -  for management of preventative medicine and chronic medical disease: COPD   -Testing: Follow-up with outpatient provider for abnormal lab results:   -If you are prescribed an atypical antipsychotic medication, we recommend that your outpatient psychiatrist follow routine screening for side effects within 3 months of discharge, including monitoring: AIMS scale, height, weight, blood pressure, fasting lipid panel, HbA1c, and fasting blood sugar.   -Recommend total abstinence from alcohol, tobacco, and other illicit drug use at discharge.   -If your psychiatric symptoms recur, worsen, or if you have side effects to your psychiatric medications, call your outpatient psychiatric provider, 911, [phone] or go to the nearest emergency department.  -If suicidal thoughts occur, immediately call your outpatient psychiatric provider, 911, 988 or go to the nearest emergency department.   Signed: Blair Chiquita Hint, NP 12/13/2023, 2:44 PM

## 2023-12-13 NOTE — [facility] Suicide Risk Assessment (Signed)
 [facility] INPATIENT:  Family/Significant Other Suicide Prevention Education  Suicide Prevention Education:  Contact Attempts: Candis Paras (sister) [phone], (name of family member/significant other) has been identified by the patient as the family member/significant other with whom the patient will be residing, and identified as the person(s) who will aid the patient in the event of a mental health crisis.  With written consent from the patient, two attempts were made to provide suicide prevention education, prior to and/or following the patient's discharge.  We were unsuccessful in providing suicide prevention education.  A suicide education pamphlet was given to the patient to share with family/significant other.   Date and time of first attempt:12/12/23/3:26PM Date and time of second attempt:12/13/23/8:49AM  Zachary [name] 12/13/2023, 8:50 AM

## 2023-12-13 NOTE — [facility] Suicide Risk Assessment (Signed)
 Suicide Risk Assessment  Discharge Assessment    [facility] Discharge Suicide Risk Assessment   Principal Problem: GAD (generalized anxiety disorder) Discharge Diagnoses: Principal Problem:   GAD (generalized anxiety disorder) Active Problems:   Chronic prescription benzodiazepine use   Total Time spent with patient: 30 minutes  Musculoskeletal: Strength & Muscle Tone: within normal limits Gait & Station: normal Patient leans: N/A  Psychiatric Specialty Exam  Presentation  General Appearance:  Appropriate for Environment  Eye Contact: Good  Speech: Clear and Coherent; Normal Rate  Speech Volume: Normal  Handedness: Right   Mood and Affect  Mood: Euthymic  Duration of Depression Symptoms: No data recorded Affect: Appropriate; Full Range   Thought Process  Thought Processes: Coherent; Linear  Descriptions of Associations:Intact  Orientation:Full (Time, Place and Person)  Thought Content:WDL  History of Schizophrenia/Schizoaffective disorder:No data recorded Duration of Psychotic Symptoms:No data recorded Hallucinations:Hallucinations: None  Ideas of Reference:None  Suicidal Thoughts:Suicidal Thoughts: No  Homicidal Thoughts:Homicidal Thoughts: No   Sensorium  Memory: Immediate Good; Recent Good; Remote Good  Judgment: Intact  Insight: Present   Executive Functions  Concentration: Good  Attention Span: Good  Recall: Good  Fund of Knowledge: Good  Language: Good   Psychomotor Activity  Psychomotor Activity:Psychomotor Activity: Normal   Assets  Assets: Communication Skills; Desire for Improvement; Housing; Resilience; Talents/Skills   Sleep  Sleep:Sleep: Good  Estimated Sleeping Duration (Last 24 Hours): 7.50 hours  Physical Exam: Physical Exam ROS Blood pressure (!) 157/75, pulse (!) 56, temperature 97.6 F (36.4 C), temperature source Oral, resp. rate 18, height 5' 9 (1.753 m), weight 66.8 kg, SpO2 99%. Body  mass index is 21.74 kg/m.  Mental Status Per Nursing Assessment::   On Admission:  Suicidal ideation indicated by patient  Demographic Factors:  Male, Age 45 or older, and Caucasian  Loss Factors: Financial problems/change in socioeconomic status  Historical Factors: Impulsivity  Risk Reduction Factors:   Sense of responsibility to family, Religious beliefs about death, Employed, Living with another person, especially a relative, Positive social support, Positive therapeutic relationship, and Positive coping skills or problem solving skills  Continued Clinical Symptoms:  More than one psychiatric diagnosis Previous Psychiatric Diagnoses and Treatments Medical Diagnoses and Treatments/Surgeries  Cognitive Features That Contribute To Risk:  None    Suicide Risk:  Minimal: No identifiable suicidal ideation. Patient denies any current suicidal ideation, intent, or plan and homicidal ideation at the time of discharge. He is future oriented. No overt psychotic symptoms. No manic features. He is established with outpatient medication management and therapy services through [facility]. There is no evidence that he is a risk to himself or others at this time. He is stable for a lower level of care.     Follow-up Information     [facility] Follow up.   Why: You may also go to this provider for an assessment, to obtain therapy and medication management services. They are open 24/7. Contact information: [address] [place] [address] [phone]         [facility] Go on 12/15/2023.   Why: Please go to this provider on 12/15/23 at 8:30 am for an assessment, to obtain therapy and medication management services. Contact information: [address] [place] [address] [phone]                 Plan Of Care/Follow-up recommendations:  See discharge summary.  Blair Chiquita Hint, NP 12/13/2023, 12:27 PM

## 2023-12-13 NOTE — Progress Notes (Signed)
   12/13/23 0800  Psych Admission Type (Psych Patients Only)  Admission Status Voluntary  Psychosocial Assessment  Patient Complaints None  Eye Contact Fair  Facial Expression Animated  Affect Appropriate to circumstance  Speech Logical/coherent  Interaction Assertive  Motor Activity Slow  Appearance/Hygiene Unremarkable  Behavior Characteristics Cooperative  Mood Pleasant  Thought Process  Coherency WDL  Content WDL  Delusions None reported or observed  Perception WDL  Hallucination None reported or observed  Judgment Poor  Confusion None  Danger to Self  Current suicidal ideation? Denies  Agreement Not to Harm Self Yes  Description of Agreement verbal  Danger to Others  Danger to Others None reported or observed

## 2023-12-13 NOTE — Progress Notes (Signed)
  [facility] Adult Case Management Discharge Plan :  Will you be returning to the same living situation after discharge:  Yes,  pt returning home after discharge.  At discharge, do you have transportation home?: Yes,  pt will be transported home via taxi voucher at 1pm. Do you have the ability to pay for your medications: Yes,  pt reported being employed and insured - Medicare Part A and B  Release of information consent forms completed and in the chart;  Patient's signature needed at discharge.  Patient to Follow up at:  Follow-up Information     [facility] Follow up.   Why: You may also go to this provider for an assessment, to obtain therapy and medication management services. They are open 24/7. Contact information: [address] [place] [address] [phone]         [facility] Go on 12/15/2023.   Why: Please go to this provider on 12/15/23 at 8:30 am for an assessment, to obtain therapy and medication management services. Contact information: [address] [place] [address] [phone]                 Next level of care provider has access to [facility] Link:no  Safety Planning and Suicide Prevention discussed: Yes,  completed with patient as we were unable to contact patient's sister.     Has patient been referred to the Quitline?: Patient refused referral for treatment  Patient has been referred for addiction treatment: Patient refused referral for treatment.  Nataniel Gasper M Jazmine Heckman, LCSWA 12/13/2023, 9:29 AM

## 2023-12-17 NOTE — Progress Notes (Signed)
 Spiritual care group on grief and loss facilitated by Chaplain Rockie Sofia, [facility]  Group Goal: Support / Education around grief and loss  Members engage in facilitated group support and psycho-social education.  Group Description:  Following introductions and group rules, group members engaged in facilitated group dialogue and support around topic of loss, with particular support around experiences of loss in their lives. Group Identified types of loss (relationships / self / things) and identified patterns, circumstances, and changes that precipitate losses. Reflected on thoughts / feelings around loss, normalized grief responses, and recognized variety in grief experience. Group encouraged individual reflection on safe space and on the coping skills that they are already utilizing.  Group drew on Adlerian / Rogerian and narrative framework  Patient Progress: Norm attended group and actively engaged and participated in group conversation and activities.
